# Patient Record
Sex: Male | Born: 1940 | Race: White | Hispanic: No | Marital: Married | Smoking: Former smoker
Health system: Southern US, Community
[De-identification: ages and names within clinical notes are randomized; demographics above are authoritative.]

## PROBLEM LIST (undated history)

## (undated) DIAGNOSIS — C4491 Basal cell carcinoma of skin, unspecified: Secondary | ICD-10-CM

## (undated) DIAGNOSIS — G4733 Obstructive sleep apnea (adult) (pediatric): Secondary | ICD-10-CM

## (undated) DIAGNOSIS — N189 Chronic kidney disease, unspecified: Secondary | ICD-10-CM

## (undated) DIAGNOSIS — H409 Unspecified glaucoma: Secondary | ICD-10-CM

## (undated) DIAGNOSIS — E119 Type 2 diabetes mellitus without complications: Secondary | ICD-10-CM

## (undated) DIAGNOSIS — I2510 Atherosclerotic heart disease of native coronary artery without angina pectoris: Secondary | ICD-10-CM

## (undated) DIAGNOSIS — D518 Other vitamin B12 deficiency anemias: Secondary | ICD-10-CM

## (undated) DIAGNOSIS — C4492 Squamous cell carcinoma of skin, unspecified: Secondary | ICD-10-CM

## (undated) DIAGNOSIS — E785 Hyperlipidemia, unspecified: Secondary | ICD-10-CM

## (undated) DIAGNOSIS — I219 Acute myocardial infarction, unspecified: Secondary | ICD-10-CM

## (undated) DIAGNOSIS — C439 Malignant melanoma of skin, unspecified: Secondary | ICD-10-CM

## (undated) DIAGNOSIS — C4330 Malignant melanoma of unspecified part of face: Secondary | ICD-10-CM

## (undated) DIAGNOSIS — H269 Unspecified cataract: Secondary | ICD-10-CM

## (undated) DIAGNOSIS — I10 Essential (primary) hypertension: Secondary | ICD-10-CM

## (undated) DIAGNOSIS — G4730 Sleep apnea, unspecified: Secondary | ICD-10-CM

## (undated) DIAGNOSIS — K635 Polyp of colon: Secondary | ICD-10-CM

## (undated) HISTORY — DX: Other vitamin B12 deficiency anemias: D51.8

## (undated) HISTORY — DX: Malignant melanoma of unspecified part of face: C43.30

## (undated) HISTORY — DX: Polyp of colon: K63.5

## (undated) HISTORY — PX: CORONARY ANGIOPLASTY WITH STENT PLACEMENT: SHX49

## (undated) HISTORY — PX: CERVICAL SPINE SURGERY: SHX589

## (undated) HISTORY — PX: APPENDECTOMY: SHX54

## (undated) HISTORY — DX: Chronic kidney disease, unspecified: N18.9

## (undated) HISTORY — PX: HERNIA REPAIR: SHX51

## (undated) HISTORY — DX: Unspecified cataract: H26.9

## (undated) HISTORY — DX: Hyperlipidemia, unspecified: E78.5

## (undated) HISTORY — PX: EYE SURGERY: SHX253

## (undated) HISTORY — DX: Acute myocardial infarction, unspecified: I21.9

## (undated) HISTORY — DX: Essential (primary) hypertension: I10

## (undated) HISTORY — DX: Obstructive sleep apnea (adult) (pediatric): G47.33

## (undated) HISTORY — DX: Sleep apnea, unspecified: G47.30

## (undated) HISTORY — DX: Malignant melanoma of skin, unspecified: C43.9

## (undated) HISTORY — DX: Unspecified glaucoma: H40.9

## (undated) HISTORY — DX: Type 2 diabetes mellitus without complications: E11.9

## (undated) HISTORY — DX: Atherosclerotic heart disease of native coronary artery without angina pectoris: I25.10

## (undated) HISTORY — PX: CHOLECYSTECTOMY: SHX55

---
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
## 1997-08-05 DIAGNOSIS — C4491 Basal cell carcinoma of skin, unspecified: Secondary | ICD-10-CM

## 1997-08-05 HISTORY — DX: Basal cell carcinoma of skin, unspecified: C44.91

## 2002-12-17 HISTORY — PX: COLONOSCOPY: SHX174

## 2003-04-28 ENCOUNTER — Ambulatory Visit (HOSPITAL_COMMUNITY): Admission: RE | Admit: 2003-04-28 | Discharge: 2003-04-28 | Payer: Self-pay | Admitting: Gastroenterology

## 2003-04-28 ENCOUNTER — Encounter (INDEPENDENT_AMBULATORY_CARE_PROVIDER_SITE_OTHER): Payer: Self-pay | Admitting: Specialist

## 2004-05-12 ENCOUNTER — Ambulatory Visit (HOSPITAL_COMMUNITY): Admission: RE | Admit: 2004-05-12 | Discharge: 2004-05-13 | Payer: Self-pay | Admitting: Cardiology

## 2005-01-25 ENCOUNTER — Ambulatory Visit: Payer: Self-pay | Admitting: Cardiology

## 2005-02-23 ENCOUNTER — Ambulatory Visit: Payer: Self-pay | Admitting: Cardiology

## 2005-03-08 DIAGNOSIS — C4492 Squamous cell carcinoma of skin, unspecified: Secondary | ICD-10-CM

## 2005-03-08 HISTORY — DX: Squamous cell carcinoma of skin, unspecified: C44.92

## 2006-04-24 ENCOUNTER — Ambulatory Visit: Payer: Self-pay

## 2007-06-17 LAB — HM COLONOSCOPY

## 2007-06-18 ENCOUNTER — Ambulatory Visit: Payer: Self-pay | Admitting: Cardiology

## 2008-07-14 ENCOUNTER — Ambulatory Visit: Payer: Self-pay | Admitting: Cardiology

## 2008-10-28 ENCOUNTER — Ambulatory Visit (HOSPITAL_COMMUNITY): Admission: RE | Admit: 2008-10-28 | Discharge: 2008-10-28 | Payer: Self-pay | Admitting: Surgery

## 2009-05-13 DIAGNOSIS — E785 Hyperlipidemia, unspecified: Secondary | ICD-10-CM | POA: Insufficient documentation

## 2009-05-13 DIAGNOSIS — I2510 Atherosclerotic heart disease of native coronary artery without angina pectoris: Secondary | ICD-10-CM | POA: Insufficient documentation

## 2009-05-18 ENCOUNTER — Ambulatory Visit: Payer: Self-pay | Admitting: Cardiology

## 2009-09-12 ENCOUNTER — Encounter: Payer: Self-pay | Admitting: Cardiology

## 2010-02-02 ENCOUNTER — Encounter: Admission: RE | Admit: 2010-02-02 | Discharge: 2010-02-02 | Payer: Self-pay | Admitting: Family Medicine

## 2010-02-13 ENCOUNTER — Encounter: Payer: Self-pay | Admitting: Cardiology

## 2010-02-15 ENCOUNTER — Telehealth: Payer: Self-pay | Admitting: Cardiology

## 2010-02-23 ENCOUNTER — Inpatient Hospital Stay (HOSPITAL_COMMUNITY): Admission: RE | Admit: 2010-02-23 | Discharge: 2010-02-25 | Payer: Self-pay | Admitting: Neurosurgery

## 2010-03-10 ENCOUNTER — Encounter: Payer: Self-pay | Admitting: Cardiology

## 2010-03-21 ENCOUNTER — Encounter: Payer: Self-pay | Admitting: Cardiology

## 2010-05-24 ENCOUNTER — Ambulatory Visit: Payer: Self-pay | Admitting: Cardiology

## 2010-05-24 DIAGNOSIS — E1121 Type 2 diabetes mellitus with diabetic nephropathy: Secondary | ICD-10-CM | POA: Insufficient documentation

## 2010-06-21 ENCOUNTER — Telehealth (INDEPENDENT_AMBULATORY_CARE_PROVIDER_SITE_OTHER): Payer: Self-pay | Admitting: *Deleted

## 2010-06-22 ENCOUNTER — Ambulatory Visit: Payer: Self-pay | Admitting: Internal Medicine

## 2010-06-22 ENCOUNTER — Encounter: Payer: Self-pay | Admitting: Internal Medicine

## 2010-06-22 ENCOUNTER — Encounter (INDEPENDENT_AMBULATORY_CARE_PROVIDER_SITE_OTHER): Payer: Self-pay | Admitting: *Deleted

## 2010-06-22 ENCOUNTER — Ambulatory Visit: Payer: Self-pay

## 2010-06-22 ENCOUNTER — Encounter (HOSPITAL_COMMUNITY): Admission: RE | Admit: 2010-06-22 | Discharge: 2010-08-23 | Payer: Self-pay | Admitting: Cardiology

## 2010-06-23 ENCOUNTER — Inpatient Hospital Stay (HOSPITAL_BASED_OUTPATIENT_CLINIC_OR_DEPARTMENT_OTHER): Admission: RE | Admit: 2010-06-23 | Discharge: 2010-06-23 | Payer: Self-pay | Admitting: Cardiovascular Disease

## 2010-06-23 ENCOUNTER — Ambulatory Visit: Payer: Self-pay | Admitting: Cardiovascular Disease

## 2010-06-27 ENCOUNTER — Encounter: Payer: Self-pay | Admitting: Cardiology

## 2010-06-28 ENCOUNTER — Ambulatory Visit: Payer: Self-pay | Admitting: Cardiology

## 2010-09-26 LAB — FECAL OCCULT BLOOD, GUAIAC: Fecal Occult Blood: NEGATIVE

## 2010-10-17 LAB — HM DIABETES EYE EXAM

## 2010-12-27 LAB — HEMOGLOBIN A1C: Hgb A1c MFr Bld: 5.7 % (ref 4.0–6.0)

## 2011-01-03 LAB — HM DIABETES FOOT EXAM

## 2011-01-14 LAB — CONVERTED CEMR LAB
BUN: 17 mg/dL (ref 6–23)
Basophils Absolute: 0 10*3/uL (ref 0.0–0.1)
Basophils Relative: 0.3 % (ref 0.0–3.0)
CO2: 24 meq/L (ref 19–32)
Calcium: 9.4 mg/dL (ref 8.4–10.5)
Chloride: 103 meq/L (ref 96–112)
Creatinine, Ser: 1.4 mg/dL (ref 0.4–1.5)
Eosinophils Absolute: 0.1 10*3/uL (ref 0.0–0.7)
Eosinophils Relative: 0.9 % (ref 0.0–5.0)
GFR calc non Af Amer: 55.22 mL/min (ref >60)
Glucose, Bld: 208 mg/dL — ABNORMAL HIGH (ref 70–99)
HCT: 37.9 % — ABNORMAL LOW (ref 39.0–52.0)
Hemoglobin: 13.2 g/dL (ref 13.0–17.0)
INR: 1.1 — ABNORMAL HIGH (ref 0.8–1.0)
Lymphocytes Relative: 31.4 % (ref 12.0–46.0)
Lymphs Abs: 1.8 10*3/uL (ref 0.7–4.0)
MCHC: 34.8 g/dL (ref 30.0–36.0)
MCV: 94.2 fL (ref 78.0–100.0)
Monocytes Absolute: 0.4 10*3/uL (ref 0.1–1.0)
Monocytes Relative: 6.5 % (ref 3.0–12.0)
Neutro Abs: 3.5 10*3/uL (ref 1.4–7.7)
Neutrophils Relative %: 60.9 % (ref 43.0–77.0)
Platelets: 180 10*3/uL (ref 150.0–400.0)
Potassium: 3.8 meq/L (ref 3.5–5.1)
Prothrombin Time: 11.8 s (ref 9.7–11.8)
RBC: 4.02 M/uL — ABNORMAL LOW (ref 4.22–5.81)
RDW: 13.8 % (ref 11.5–14.6)
Sodium: 139 meq/L (ref 135–145)
WBC: 5.7 10*3/uL (ref 4.5–10.5)
aPTT: 29 s — ABNORMAL HIGH (ref 21.7–28.8)

## 2011-01-18 NOTE — Miscellaneous (Signed)
  Clinical Lists Changes  Observations: Added new observation of CARDCATHFIND: . Total occlusion of the right coronary artery with right-to-right     and left-to-right collaterals. 2. Nonobstructive left main, left anterior descending, and left     circumflex stenoses. 3. Mild left ventricular contraction abnormality with overall     preserved left ventricular ejection fraction.   DISCUSSION:  Timothy [name] is asymptomatic at his current activity level. He does not push his exercise program to the same level that he exercised on the treadmill yesterday.  I think the most prudent course of therapy is to continue with medical treatment.  His occluded right coronary artery is technically approachable by percutaneous intervention, but his stress Myoview results looked like only a small area of ischemia, even though he did have some EKG changes and chest burning at peak exercise.  If he has symptoms refractory to medical therapy, we would then consider percutaneous treatment of his occluded right coronary artery.  I will discuss this with Dr. Percival Spanish.     (06/23/2010 10:26) Added new observation of NUCLEAR NOS: Exercise Capacity: Good exercise capacity. BP Response: Normal blood pressure response. Clinical Symptoms: Chest burning. ECG Impression: 1 mm flat ST depression in Stage II in leads V4-V6; increased to leads II, AVF.  Became more downslping in recovery.  Did not completeley resolve. Overall Impression Comments: Inferior/inferoseptal/apical scar with mild ischemia.  Cannot exclude coexistent soft tissue attenuation. Since previous report EKG changes are new. Inferior, inferoseptal, inferolateral scar with ischemia noted at that time as well.  Cannot completely compare as images not available (06/22/2010 10:26)       Nuclear Study  Procedure date:  06/22/2010  Findings:      Exercise Capacity: Good exercise capacity. BP Response: Normal blood pressure response. Clinical  Symptoms: Chest burning. ECG Impression: 1 mm flat ST depression in Stage II in leads V4-V6; increased to leads II, AVF.  Became more downslping in recovery.  Did not completeley resolve. Overall Impression Comments: Inferior/inferoseptal/apical scar with mild ischemia.  Cannot exclude coexistent soft tissue attenuation. Since previous report EKG changes are new. Inferior, inferoseptal, inferolateral scar with ischemia noted at that time as well.  Cannot completely compare as images not available  Cardiac Cath  Procedure date:  06/23/2010  Findings:      . Total occlusion of the right coronary artery with right-to-right     and left-to-right collaterals. 2. Nonobstructive left main, left anterior descending, and left     circumflex stenoses. 3. Mild left ventricular contraction abnormality with overall     preserved left ventricular ejection fraction.   DISCUSSION:  Timothy [name] is asymptomatic at his current activity level. He does not push his exercise program to the same level that he exercised on the treadmill yesterday.  I think the most prudent course of therapy is to continue with medical treatment.  His occluded right coronary artery is technically approachable by percutaneous intervention, but his stress Myoview results looked like only a small area of ischemia, even though he did have some EKG changes and chest burning at peak exercise.  If he has symptoms refractory to medical therapy, we would then consider percutaneous treatment of his occluded right coronary artery.  I will discuss this with Dr. Percival Spanish.

## 2011-01-18 NOTE — Consult Note (Signed)
Summary: [facility]  [facility]   Imported By: Marilynne Drivers 03/07/2010 16:51:52  _____________________________________________________________________  External Attachment:    Type:   Image     Comment:   External Document

## 2011-01-18 NOTE — Letter (Signed)
Summary: [facility] Office Note  [facility] Office Note   Imported By: Sallee Provencal 04/24/2010 11:24:45  _____________________________________________________________________  External Attachment:    Type:   Image     Comment:   External Document

## 2011-01-18 NOTE — Progress Notes (Signed)
Summary: Nuclear Pre-Procedure  Phone Note Outgoing Call   Call placed by: Perrin Maltese, EMT-P,  June 21, 2010 4:33 PM Summary of Call: Reviewed information on Myoview Information Sheet (see scanned document for further details).  Spoke with patient's wife.     Nuclear Med Background Indications for Stress Test: Evaluation for Ischemia, Graft Patency, Stent Patency   History: Angioplasty, Heart Catheterization, Myocardial Perfusion Study, Stents  History Comments: '99 Angioplasty RCA '05 MPS mild inf. inferoapical ischemia EF49% '05 Heart Cath EF 55% High Grade RCA N/O CAD LCFX- LAD '05 Stents Prox. RCA distal RCA     Nuclear Pre-Procedure Cardiac Risk Factors: Family History - CAD, History of Smoking, Lipids, NIDDM Height (in): 54  Nuclear Med Study 1 or 2 day study:  1 day     Referring MD:  J.Hochrein

## 2011-01-18 NOTE — Letter (Signed)
Summary: Cardiac Catheterization Instructions- [place], [place]  [id number] [place]   [place], [place] [address]   Phone: [phone]  Fax: [phone]     06/22/2010 MRN: [id number]  [address] [place],   [address]  Dear Mr. Minich,   You are scheduled for a Cardiac Catheterization on Friday 06/23/2010 with Dr.Cooper  Please arrive to the 1st floor of the Heart and Vascular Center at [facility] at 9:30 am  on the day of your procedure. Please do not arrive before 6:30 a.m. Call the Heart and Vascular Center at [phone] if you are unable to make your appointmnet. The Code to get into the parking garage under the building is 100. Take the elevators to the 1st floor. You must have someone to drive you home. Someone must be with you for the first 24 hours after you arrive home. Please wear clothes that are easy to get on and off and wear slip-on shoes. Do not eat or drink after midnight except water with your medications that morning. Bring all your medications and current insurance cards with you.  ___ DO NOT take these medications before your procedure: Metformin ___ Make sure you take your aspirin.  ___ You may take ALL of your medications with water that morning.  ___ DO NOT take ANY medications before your procedure.    The usual length of stay after your procedure is 2 to 3 hours. This can vary.  If you have any questions, please call the office at the number listed above.   Sim Boast, RN

## 2011-01-18 NOTE — Letter (Signed)
Summary: [facility]  [facility]   Imported By: Sallee Provencal 03/31/2010 14:26:41  _____________________________________________________________________  External Attachment:    Type:   Image     Comment:   External Document

## 2011-01-18 NOTE — Assessment & Plan Note (Signed)
Summary: [facility]      Allergies Added:   Visit Type:  Follow-up Primary Provider:  Dr. Laurance Flatten  CC:  CAD.  History of Present Illness: The patient presents for followup after cardiac catheterization. He had a stress test demonstrating inferior wall infarct with peri-infarct ischemia. He had previous stenting to 2 areas of his right coronary artery. The distal stent was found to be occluded. He had a 30-40% LAD stenosis as well as a 50% obtuse marginal stenosis. He has been managed medically. Since that time he denies any chest pressure, neck or arm discomfort. He has had no palpitations, presyncope or syncope. He denies any PND or orthopnea. He had no problems with his leg where he had the cardiac catheterization.  Current Medications (verified): 1)  Lipitor 80 Mg Tabs (Atorvastatin Calcium) .Marland Kitchen.. 1 By Mouth Daily 2)  Aspirin 81 Mg  Tabs (Aspirin) .Marland Kitchen.. 1 By Mouth Daily 3)  Plavix 75 Mg Tabs (Clopidogrel Bisulfate) .Marland Kitchen.. 1 By Mouth Dialy 4)  Azor 5-40 Mg Tabs (Amlodipine-Olmesartan) .Marland Kitchen.. 1 By Mouth Daily 5)  Protonix 40 Mg Tbec (Pantoprazole Sodium) .Marland Kitchen.. 1 By Mouth Daily 6)  Trilipix 135 Mg Cpdr (Choline Fenofibrate) .Marland Kitchen.. 1 By Mouth Daily 7)  Metoprolol Succinate 50 Mg Xr24h-Tab (Metoprolol Succinate) .Marland Kitchen.. 1 By Mouth Dialy 8)  Metformin Hcl 1000 Mg Tabs (Metformin Hcl) .... By Mouth Two Times A Day 9)  Vitamin D 1000 Unit Tabs (Cholecalciferol) .Marland Kitchen.. 1 By Mouth Daily 10)  Niaspan 1000 Mg Cr-Tabs (Niacin (Antihyperlipidemic)) .Marland Kitchen.. 1 By Mouth Daily 11)  Hydrochlorothiazide 25 Mg Tabs (Hydrochlorothiazide) .Marland Kitchen.. 1 By Mouth Daily 12)  Fish Oil   Oil (Fish Oil) .... 2 By Mouth Two Times A Day 13)  Nitrostat 0.4 Mg Subl (Nitroglycerin) .Marland Kitchen.. 1 By Mouth As Needed  Allergies (verified): 1)  ! Vicodin  Past History:  Past Medical History:  1. Coronary artery disease (left main normal, LAD 25-30% stenosis,       distal 30-40% stenosis, circumflex obtuse marginal 50% stenosis,   right coronary artery dominant with long 75% followed by mid 80%       stenosis followed by 9% stenosis at the ostium of the PDA.  He had       stenting of the PDA by Dr. [name].  This was a Cypher stent.       First angioplasty was 43 and his most recent in 2005.  Cath ).   2. Dyslipidemia.   3. Diabetes mellitus  4. BPH  Past Surgical History: Reviewed history from 05/24/2010 and no changes required.  Cholecystectomy.   Appendectomy.   Cervical Spine Surgery  Review of Systems       As stated in the HPI and negative for all other systems.   Vital Signs:  Patient profile:   70 year old male Height:      68 inches Weight:      191 pounds BMI:     29.15 Pulse rate:   76 / minute Resp:     16 per minute BP sitting:   122 / 70  (right arm)  Vitals Entered By: Levora Angel, CNA (June 28, 2010 2:49 PM)  Physical Exam  General:  Well developed, well nourished, in no acute distress. Head:  normocephalic and atraumatic Eyes:  PERRLA/EOM intact; conjunctiva and lids normal. Mouth:  Edentulous. Oral mucosa normal. Neck:  Neck supple, no JVD. No masses, thyromegaly or abnormal cervical nodes. Chest Wall:  no deformities or breast masses noted Lungs:  clear Abdomen:  Bowel sounds positive; abdomen soft and non-tender without masses, organomegaly, or hernias noted. No hepatosplenomegaly. Msk:  Back normal, normal gait. Muscle strength and tone normal. Extremities:  No clubbing or cyanosis. Neurologic:  Alert and oriented x 3. Skin:  Intact without lesions or rashes. Cervical Nodes:  no significant adenopathy Inguinal Nodes:  no significant adenopathy Psych:  Normal affect.   EKG  Procedure date:  06/28/2010  Findings:      Sinus rhythm, rate 77, axis within normal limits, intervals within normal limits, no acute ST-T wave changes.  Impression & Recommendations:  Problem # 1:  CAD (ICD-414.00) Patient has no new symptoms. He has disease as described and will be  managed with aggressive risk reduction. We reviewed at length and exercise regimen. Orders: EKG w/ Interpretation (93000)  Problem # 2:  DYSLIPIDEMIA (ICD-272.4) He is still not at target with his lipids despite multiple medications. It is clear that his diet is not well controlled. We had a long discussion about this. Orders: EKG w/ Interpretation (93000)  Problem # 3:  ESSENTIAL HYPERTENSION, BENIGN (ICD-401.1) His blood pressure is well controlled. He will continue the meds as listed.  Patient Instructions: 1)  Your physician recommends that you schedule a follow-up appointment in: 1 yr with Dr Percival Spanish 2)  Your physician recommends that you continue on your current medications as directed. Please refer to the Current Medication list given to you today.

## 2011-01-18 NOTE — Assessment & Plan Note (Signed)
Summary: Cardiology Nuclear Study  Nuclear Med Background Indications for Stress Test: Evaluation for Ischemia, Graft Patency, Stent Patency   History: Angioplasty, Heart Catheterization, Myocardial Perfusion Study, Stents  History Comments: '99 Angioplasty RCA '05 MPS mild inf. inferoapical ischemia EF49% '05 Heart Cath EF 55% High Grade RCA N/O CAD LCFX- LAD '05 Stents Prox. RCA distal RCA  Symptoms: Chest Pain, DOE, Palpitations, SOB    Nuclear Pre-Procedure Cardiac Risk Factors: Family History - CAD, History of Smoking, Lipids, NIDDM Caffeine/Decaff Intake: none NPO After: 8:30 PM Lungs: clear IV 0.9% NS with Angio Cath: 18g     IV Site: (R) AC IV Started by: Eliezer Lofts EMT-P Chest Size (in) 44     Height (in): 68 Weight (lb): 187 BMI: 28.54 Tech Comments: CBG= 140 @ 7:20 am this day, per Patient. This patient walked on the treadmill. He had chest burning, + EKG changes, and + pictures. Dr. Vita Barley was consulted and he stated he was to be set up for a cath on 06/23/10.  Nuclear Med Study 1 or 2 day study:  1 day     Stress Test Type:  Stress Reading MD:  Dorris Carnes, MD     Referring MD:  J.Hochrein Resting Radionuclide:  Technetium 18m Tetrofosmin     Resting Radionuclide Dose:  11 mCi  Stress Radionuclide:  Technetium 34m Tetrofosmin     Stress Radionuclide Dose:  33 mCi   Stress Protocol Exercise Time (min):  8:00 min     Max HR:  133 bpm     Predicted Max HR:  123XX123 bpm  Max Systolic BP: Q000111Q mm Hg     Percent Max HR:  88.08 %     METS: 10.10 Rate Pressure Product:  [id number]    Stress Test Technologist:  Perrin Maltese EMT-P     Nuclear Technologist:  Charlton Amor CNMT  Rest Procedure  Myocardial perfusion imaging was performed at rest 45 minutes following the intravenous administration of Myoview Technetium 65m Tetrofosmin.  Stress Procedure  The patient exercised for 8:00. The patient stopped due to fatigue and chest burning.  There were + significant ST-T  wave changes and a rare pac. Myoview was injected at peak exercise and myocardial perfusion imaging was performed after a brief delay.  QPS Raw Data Images:  Soft tissue (diaphragm) underlies heart. Stress Images:  Defect with decreased counts in the inferior(base, mid, distal), inferoseptal (base, minimally mid),Inferolateral (base) apex. Rest Images:  Improvement in the distal inferior wall, basal inferoseptal wall and partial improvement in apex. Transient Ischemic Dilatation:  .99  (Normal <1.22)  Lung/Heart Ratio:  .40  (Normal <0.45)  Quantitative Gated Spect Images QGS EDV:  86 ml QGS ESV:  39 ml QGS EF:  55 % QGS cine images:  Inferior hypokinesis   Overall Impression  Exercise Capacity: Good exercise capacity. BP Response: Normal blood pressure response. Clinical Symptoms: Chest burning. ECG Impression: 1 mm flat ST depression in Stage II in leads V4-V6; increased to leads II, AVF.  Became more downslping in recovery.  Did not completeley resolve. Overall Impression Comments: Inferior/inferoseptal/apical scar with mild ischemia.  Cannot exclude coexistent soft tissue attenuation. Since previous report EKG changes are new. Inferior, inferoseptal, inferolateral scar with ischemia noted at that time as well.  Cannot completely compare as images not available  Appended Document: Cardiology Nuclear Study pt aware of results - cath 06/23/2010 in [place] lab

## 2011-01-18 NOTE — Progress Notes (Signed)
Summary: clearance for surgery   Phone Note From Other Clinic   Summary of Call: I had a request from Dr. Frederich Cha to provide surgical clearance for this patient.  I spoke with him on the phone.  He has been active and has had no chest pain or other symptoms.  He has a high functional level.  He is having cervical spine surgery.  According to ACC/AHA guidelines the patient is at acceptable risk for this procedure. He can stop his Plavix prior to the surgery and restarted when felt to be safe afterwards. Initial call taken by: Minus Breeding, MD, [facility],  February 15, 2010 4:54 PM

## 2011-01-18 NOTE — Letter (Signed)
Summary: [facility] Office Note  [facility] Office Note   Imported By: Sallee Provencal 03/31/2010 13:20:08  _____________________________________________________________________  External Attachment:    Type:   Image     Comment:   External Document

## 2011-01-18 NOTE — Assessment & Plan Note (Signed)
Summary: [facility]  Medications Added LIPITOR 80 MG TABS (ATORVASTATIN CALCIUM) 1 by mouth daily ASPIRIN 81 MG  TABS (ASPIRIN) 1 by mouth daily PLAVIX 75 MG TABS (CLOPIDOGREL BISULFATE) 1 by mouth dialy AZOR 5-40 MG TABS (AMLODIPINE-OLMESARTAN) 1 by mouth daily PROTONIX 40 MG TBEC (PANTOPRAZOLE SODIUM) 1 by mouth daily TRILIPIX 135 MG CPDR (CHOLINE FENOFIBRATE) 1 by mouth daily METOPROLOL SUCCINATE 50 MG XR24H-TAB (METOPROLOL SUCCINATE) 1 by mouth dialy METFORMIN HCL 1000 MG TABS (METFORMIN HCL) by mouth two times a day VITAMIN D 1000 UNIT TABS (CHOLECALCIFEROL) 1 by mouth daily NIASPAN 1000 MG CR-TABS (NIACIN (ANTIHYPERLIPIDEMIC)) 1 by mouth daily HYDROCHLOROTHIAZIDE 25 MG TABS (HYDROCHLOROTHIAZIDE) 1 by mouth daily FISH OIL   OIL (FISH OIL) 2 by mouth two times a day NITROSTAT 0.4 MG SUBL (NITROGLYCERIN) 1 by mouth as needed      Allergies Added: ! VICODIN  Visit Type:  Follow-up Primary Provider:  Dr. Laurance Flatten  CC:  CAD.  History of Present Illness: The patient presents for yearly followup. Since I last saw him he did have cervical spine surgery. He apparently did well with this. He still has some discomfort. He is able to do some activities around his house but doesn't do anything overly exerting by his report. He may push a lawnmower for a short time. With this he is not getting any shortness of breath.  He denies  PND or orthopnea. He is not describing palpitations, presyncope or syncope. He is having no chest pressure, neck or arm discomfort.    The patient reminds me that prior to his last angioplasty in 2005 he had no symptoms but had a routine stress test.  Current Medications (verified): 1)  Lipitor 80 Mg Tabs (Atorvastatin Calcium) .Marland Kitchen.. 1 By Mouth Daily 2)  Aspirin 81 Mg  Tabs (Aspirin) .Marland Kitchen.. 1 By Mouth Daily 3)  Plavix 75 Mg Tabs (Clopidogrel Bisulfate) .Marland Kitchen.. 1 By Mouth Dialy 4)  Azor 5-40 Mg Tabs (Amlodipine-Olmesartan) .Marland Kitchen.. 1 By Mouth Daily 5)  Protonix 40 Mg  Tbec (Pantoprazole Sodium) .Marland Kitchen.. 1 By Mouth Daily 6)  Trilipix 135 Mg Cpdr (Choline Fenofibrate) .Marland Kitchen.. 1 By Mouth Daily 7)  Metoprolol Succinate 50 Mg Xr24h-Tab (Metoprolol Succinate) .Marland Kitchen.. 1 By Mouth Dialy 8)  Metformin Hcl 1000 Mg Tabs (Metformin Hcl) .... By Mouth Two Times A Day 9)  Vitamin D 1000 Unit Tabs (Cholecalciferol) .Marland Kitchen.. 1 By Mouth Daily 10)  Niaspan 1000 Mg Cr-Tabs (Niacin (Antihyperlipidemic)) .Marland Kitchen.. 1 By Mouth Daily 11)  Hydrochlorothiazide 25 Mg Tabs (Hydrochlorothiazide) .Marland Kitchen.. 1 By Mouth Daily 12)  Fish Oil   Oil (Fish Oil) .... 2 By Mouth Two Times A Day 13)  Nitrostat 0.4 Mg Subl (Nitroglycerin) .Marland Kitchen.. 1 By Mouth As Needed  Allergies (verified): 1)  ! Vicodin  Past History:  Past Medical History:  1. Coronary artery disease (left main normal, LAD 25-30% stenosis,       distal 30-40% stenosis, circumflex obtuse marginal 50% stenosis,       right coronary artery dominant with long 75% followed by mid 80%       stenosis followed by 9% stenosis at the ostium of the PDA.  He had       stenting of the PDA by Dr. [name].  This was a Cypher stent.       First angioplasty was 107 and his most recent in 2005).   2. Dyslipidemia.   3. Diabetes mellitus  4. BPH  Past Surgical History:  Cholecystectomy.   Appendectomy.  Cervical Spine Surgery  Review of Systems       As stated in the HPI and negative for all other systems.   Vital Signs:  Patient profile:   70 year old male Height:      68 inches Weight:      188 pounds BMI:     28.69 Pulse rate:   69 / minute Resp:     16 per minute BP sitting:   128 / 62  (right arm)  Vitals Entered By: Levora Angel, CNA (May 24, 2010 9:44 AM)  Physical Exam  General:  Well developed, well nourished, in no acute distress. Head:  normocephalic and atraumatic Eyes:  PERRLA/EOM intact; conjunctiva and lids normal. Mouth:  Edentulous. Oral mucosa normal. Neck:  Neck supple, no JVD. No masses, thyromegaly or abnormal cervical  nodes. Chest Wall:  no deformities or breast masses noted Lungs:  Clear bilaterally to auscultation and percussion. Abdomen:  Bowel sounds positive; abdomen soft and non-tender without masses, organomegaly, or hernias noted. No hepatosplenomegaly. Msk:  Back normal, normal gait. Muscle strength and tone normal. Extremities:  No clubbing or cyanosis. Neurologic:  Alert and oriented x 3. Skin:  Intact without lesions or rashes. Cervical Nodes:  no significant adenopathy Axillary Nodes:  no significant adenopathy Inguinal Nodes:  no significant adenopathy Psych:  Normal affect.   Detailed Cardiovascular Exam  Neck    Carotids: Carotids full and equal bilaterally without bruits.      Neck Veins: Normal, no JVD.    Heart    Inspection: no deformities or lifts noted.      Palpation: normal PMI with no thrills palpable.      Auscultation: regular rate and rhythm, S1, S2 without murmurs, rubs, gallops, or clicks.    Vascular    Abdominal Aorta: no palpable masses, pulsations, or audible bruits.      Femoral Pulses: normal femoral pulses bilaterally.      Pedal Pulses: normal pedal pulses bilaterally.      Radial Pulses: normal radial pulses bilaterally.      Peripheral Circulation: no clubbing, cyanosis, or edema noted with normal capillary refill.     EKG  Procedure date:  05/24/2010  Findings:      Sus rhythm, rate 75, axis within normal limits, intervals within normal limits, early transition lead V2  Impression & Recommendations:  Problem # 1:  CAD (ICD-414.00) The patient has coronary disease as described above. He has not had adequate symptoms to indicate progression of disease in the past. It has been 6 years since his last stress test. Therefore, one is indicated. We will otherwise continue with risk reduction. Orders: Nuclear Stress Test (Nuc Stress Test) EKG w/ Interpretation (93000)  Problem # 2:  DYSLIPIDEMIA (ICD-272.4) I reviewed his lipids from May. His LDL was  within target but his HDL is too low. I have instructed him on increased exercise. Dr. Laurance Flatten increased his fish oil.  Problem # 3:  DM (ICD-250.00) His hemoglobin A1c was 6.2. He continues with medical management and diet control.  Patient Instructions: 1)  Your physician recommends that you schedule a follow-up appointment in: 1 yr  In [place] 2)  Your physician recommends that you continue on your current medications as directed. Please refer to the Current Medication list given to you today. 3)  Your physician has requested that you have an exercise stress myoview.  For further information please visit [email or web address].  Please follow instruction sheet, as given.

## 2011-03-09 ENCOUNTER — Encounter: Payer: Self-pay | Admitting: Family Medicine

## 2011-03-09 DIAGNOSIS — N400 Enlarged prostate without lower urinary tract symptoms: Secondary | ICD-10-CM | POA: Insufficient documentation

## 2011-03-09 DIAGNOSIS — K635 Polyp of colon: Secondary | ICD-10-CM | POA: Insufficient documentation

## 2011-03-09 DIAGNOSIS — D518 Other vitamin B12 deficiency anemias: Secondary | ICD-10-CM

## 2011-03-09 DIAGNOSIS — I2510 Atherosclerotic heart disease of native coronary artery without angina pectoris: Secondary | ICD-10-CM

## 2011-03-09 DIAGNOSIS — I10 Essential (primary) hypertension: Secondary | ICD-10-CM

## 2011-03-11 LAB — CBC
HCT: 38.9 % — ABNORMAL LOW (ref 39.0–52.0)
Hemoglobin: 13.6 g/dL (ref 13.0–17.0)
MCHC: 35 g/dL (ref 30.0–36.0)
MCV: 94.6 fL (ref 78.0–100.0)
Platelets: 179 10*3/uL (ref 150–400)
RBC: 4.11 MIL/uL — ABNORMAL LOW (ref 4.22–5.81)
RDW: 13.4 % (ref 11.5–15.5)
WBC: 8.2 10*3/uL (ref 4.0–10.5)

## 2011-03-11 LAB — GLUCOSE, CAPILLARY
Glucose-Capillary: 140 mg/dL — ABNORMAL HIGH (ref 70–99)
Glucose-Capillary: 153 mg/dL — ABNORMAL HIGH (ref 70–99)
Glucose-Capillary: 162 mg/dL — ABNORMAL HIGH (ref 70–99)
Glucose-Capillary: 165 mg/dL — ABNORMAL HIGH (ref 70–99)
Glucose-Capillary: 170 mg/dL — ABNORMAL HIGH (ref 70–99)
Glucose-Capillary: 171 mg/dL — ABNORMAL HIGH (ref 70–99)
Glucose-Capillary: 172 mg/dL — ABNORMAL HIGH (ref 70–99)
Glucose-Capillary: 186 mg/dL — ABNORMAL HIGH (ref 70–99)
Glucose-Capillary: 248 mg/dL — ABNORMAL HIGH (ref 70–99)

## 2011-03-11 LAB — BASIC METABOLIC PANEL
BUN: 13 mg/dL (ref 6–23)
CO2: 26 mEq/L (ref 19–32)
Calcium: 9.6 mg/dL (ref 8.4–10.5)
Chloride: 102 mEq/L (ref 96–112)
Creatinine, Ser: 1.34 mg/dL (ref 0.4–1.5)
GFR calc Af Amer: >60 mL/min (ref >60)
GFR calc non Af Amer: 53 mL/min — ABNORMAL LOW (ref >60)
Glucose, Bld: 140 mg/dL — ABNORMAL HIGH (ref 70–99)
Potassium: 4 mEq/L (ref 3.5–5.1)
Sodium: 132 mEq/L — ABNORMAL LOW (ref 135–145)

## 2011-03-11 LAB — APTT: aPTT: 31 seconds (ref 24–37)

## 2011-03-11 LAB — TYPE AND SCREEN
ABO/RH(D): B POS
Antibody Screen: NEGATIVE

## 2011-03-11 LAB — PROTIME-INR
INR: 1.02 (ref 0.00–1.49)
Prothrombin Time: 13.3 seconds (ref 11.6–15.2)

## 2011-03-11 LAB — ABO/RH: ABO/RH(D): B POS

## 2011-03-11 LAB — SURGICAL PCR SCREEN
MRSA, PCR: NEGATIVE
Staphylococcus aureus: NEGATIVE

## 2011-05-01 NOTE — Assessment & Plan Note (Signed)
[place] OFFICE NOTE   Timothy [name], Timothy [name]                       MRN:          [id number]  DATE:06/18/2007                            DOB:          [birth year]    PRIMARY:  Dr. Morrie Sheldon   REASON FOR PRESENTATION:  Patient with coronary disease.   HISTORY OF PRESENT ILLNESS:  The patient returns for a yearly followup.  He is now 71 years old.  He has done well since I last saw him.  He  walks 4 or more times a week for exercise.  He says he walks briskly.  He says if he gets going very fast he may get some chest discomfort that  goes away if he slows down a little.  This has been a stable pattern.  This is not new.  He has not had any resting complaints.  He denies any  neck or arm discomfort.  He has no shortness of breath.  He denies any  PND or orthopnea.  He has had no palpitation, presyncope, or syncope.   He keeps a blood pressure diary.  His blood pressure is nicely  controlled with very rare blood pressures in the 0000000 or [id number] systolic.  He seems to be elevated when he comes to the doctors office.   PAST MEDICAL HISTORY:  Coronary artery disease (left main normal, LAD  25% to 30% stenosed, distal 30% to 40% stenosed.  Circumflex obtuse  marginal 50% stenosed, right coronary artery dominant with long 75%  followed by mid 80%, followed by 90% stenosis at the ostium of the PDA.  The patient had stenting of the PDA by Dr. [name].  His first  angioplasty was 42 and he had most recent in 2005).  Dyslipidemia,  cholecystectomy, appendectomy.   No allergies.   CURRENT MEDICATIONS:  1. Plavix 75 mg daily.  2. Aspirin 81 mg daily.  3. Toprol 12.5 mg b.i.d.  4. Crestor 20 mg.  5. Fenofibrate  6. Lovaza  7. Prilosec.   REVIEW OF SYSTEMS:  As stated in the HPI.  Otherwise negative for other  systems.   PHYSICAL EXAMINATION:  The patient is in no distress.  Blood pressure 166/80, heart rate 62 and  regular.  Weight 191 pounds.  Body mass index 38.  HEENT:  Eye lids unremarkable.  Pupils equal, round and reactive to  light.  Fundi not visualized.  Oral mucosa unremarkable.  NECK:  No jugular venous distention at 45 degrees.  Carotid upstroke  brisk and symmetric, no bruits, no thyromegaly.  LYMPHATICS:  No cervical, axillary, inguinal adenopathy.  LUNGS:  Clear to auscultation bilaterally.  BACK:  No costovertebral angle tenderness.  CHEST:  Unremarkable.  HEART:  PMI not displaced or sustained.  S1 and S2 within normal limits.  No S3, no S4.  No clicks, no rubs, no murmurs.  ABDOMEN:  Mildly obese, positive bowel sounds.  Normal in frequency,  pitch.  No bruits, no rebound, no guarding, no midline pulse, without  hepatomegaly, splenomegaly.  SKIN:  No rashes.  No nodules.  EXTREMITIES:  2+ pulses throughout, no edema.  No cyanosis, no clubbing.  NEURO:  Oriented to person, place and time.  Cranial nerves II through  XII grossly intact. Motor grossly intact.   EKG:  Sins rhythm, rate 62, axis is within normal limits, probably  inferior posterior infarct with prominent R waves in V2, no specific T  wave flattening, no acute ST-T wave change.   ASSESSMENT AND PLAN:  1. Coronary disease.  The patient is having no symptoms related to      this.  No further cardiovascular testing is suggested.  Of note, he      can come off of his Plavix as needed for an elective colonoscopy.  2. Hypertension.  Blood pressure  is slightly elevated occasionally.      He seems to have some white-coat hypertension.  He is on a very low      dose of beta blocker and I think he would tolerate 25 mg b.i.d.  I      will start this and then switch to a 50 mg once a day extended      release.  3. Obesity, we talked about this and he is intent on continuing to      lose some weight.  4. Dyslipidemia.  He has low HDL.  This is being followed closely and      treated appropriately by Dr. Laurance Flatten.  5. Followup.   I will see him in 1 year or sooner if needed.     Minus Breeding, MD, [facility]  Electronically Signed    JH/MedQ  DD: 06/18/2007  DT: 06/18/2007  Job #: [id number]   cc:   Chipper Herb, M.D.

## 2011-05-01 NOTE — Op Note (Signed)
NAME:  Timothy [name], Timothy [name] NO.:  [id number]   MEDICAL RECORD NO.:  [id number]          PATIENT TYPE:  AMB   LOCATION:  DAY                          FACILITY:  [facility]   PHYSICIAN:  Fenton Malling. Lucia Gaskins, M.D.  DATE OF BIRTH:  September 03, 1941   DATE OF PROCEDURE:  10/28/2008  DATE OF DISCHARGE:                               OPERATIVE REPORT   Date of surgery ??   Date of Surgery ??   PREOPERATIVE DIAGNOSIS:  Bilateral inguinal hernia.   POSTOPERATIVE DIAGNOSIS:  Left direct and indirect inguinal hernia, a  right direct inguinal hernia.   PROCEDURES:  Laparoscopic bilateral inguinal hernia repairs with precut  [employer] C-QUR mesh.   SURGEON:  Fenton Malling. Lucia Gaskins, M.D.   ANESTHESIA:  General endotracheal.   ESTIMATED BLOOD LOSS:  Minimal.   INDICATIONS FOR PROCEDURE:  Mr. Guldner is a 70 year old white male of  Dr. Morrie Sheldon who for 3 or 4 months has noticed increasing inguinal  hernias, the left side being larger than the right side.  He now comes  in for attempted laparoscopic inguinal hernia repair.  The indications  and potential complications of hernia repair were explained to the  patient.  Potential complications include, but not limited to, bleeding,  infection, nerve injury, and recurrence of the hernia.   PROCEDURE IN DETAIL:  The patient was placed in a supine position, given  a general anesthetic supervised Dr. Myrtie Soman, both arms were tucked.  A Foley catheter was placed by me.  He had a little bit of meatal  narrowing that I opened with a hemostat but then the Foley catheter  passed without difficulty.  His lower abdomen was prepped with Techni-  Care and sterilely draped and a time-out held was held identifying the  patient and the procedure.   Because his left inguinal hernia was larger I elected to go through an  infraumbilical incision and dissected at the left side of midline,  opened the anterior rectus fascia, retracted the rectus abdominis muscle  anteriorly and then passed the [place] [employer] balloon in the preperitoneal  space.  This was insufflated under direct visualization dissecting the  peritoneum posteriorly and the muscles anteriorly.  Because of the scar  in his right lower quadrant I did not get good dissection on the right  side as far as exposing the hernia but on the left side I got a very  good dissection.  I then placed two trocars, a 5 mm Ethicon trocar in  the left lower quadrant and a 5 mm in the right lower quadrant.  I  dissected out on the left side.  He has a left sided medium sized direct  inguinal hernia and I think it was the primary hernia I was feeling,  however, I dissected out the cord.  He also had an approximate 7 or 8 cm  indirect inguinal hernia.  I teased this sac off the cord structures and  I did ligate the indirect sac with a 0 PDS suture as an Endoloop.   I then looked at the right lower quadrant.  I dissected back  as much of  this scar from the appendectomy.  I really could not get this back very  far but I was able to see well the inguinal floor.  He also had a direct  inguinal hernia which was smaller on the right than the left.  I did not  find an indirect inguinal hernia but I did isolate the cord structures.  So after both sides were completely resected I then used the C-QUR light  mesh from Atrium on the right side.  I put a precut Atrium mesh lot  number [id number] on the left side.  I put the same precut Atrium mesh  lot number [id number] on the right side.  I wrapped the mesh on both  sides around the cord.  I stapled the mesh medially to the pubic  tubercle and through it to the shelving edge of the inguinal ligament or  Cooper's ligament.  I brought the tack around and encircled the cord  structures.  I put the tacks anteriorly along the transversus abdominis  fascia, but I avoided the area lateral to the course which was inferior  to the ilioinguinal ligament on both sides.  I think  I ended up putting  10 tacks on the right and 11 tacks on the left.  The mesh lay flat.  The  cord structure was encircled and the hernia defects were covered.  The  patient tolerated this well.  There was no bleeding.   I removed the trocars under direct visualization.  I did have one small  hole in the peritoneum and trying to dissect down the right lower  quadrant appendectomy incision.  I then closed the anterior rectus  fascia after removing the trocars with a zero Vicryl suture.  I closed  the skin with a 5-0 Monocryl suture.  I painted each wound with tincture  of Benzoin and Steri-Strips.  The patient tolerated the procedure well,  was transported to the recovery room in good condition.  Sponge and  needle count were correct at the end of the case.      Fenton Malling. Lucia Gaskins, M.D.  Electronically Signed     DHN/MEDQ  D:  10/28/2008  T:  10/28/2008  Job:  [id number]   cc:   Chipper Herb, M.D.  Fax: [phone]   Minus Breeding, MD, [place]  [place] [address]

## 2011-05-01 NOTE — Assessment & Plan Note (Signed)
[place] OFFICE NOTE   Timothy [name], Timothy [name]                       MRN:          [id number]  DATE:05/18/2009                            DOB:          February 18, 1941    PRIMARY CARE PHYSICIAN:  Chipper Herb, M.D.   REASON FOR PRESENTATION:  Evaluate the patient with coronary disease.   HISTORY OF PRESENT ILLNESS:  The patient returns for yearly followup.  Since I last saw him, he has done well.  He has been exercising  routinely.  He has no problems with this level of aerobic activity.  He  denies any chest discomfort, neck or arm discomfort.  He has no  shortness of breath, PND, or orthopnea.  He has no palpitations,  presyncope or syncope.  Of note, he had bilateral inguinal hernia repair  and did well with this since I last saw him.   PAST MEDICAL HISTORY:  Coronary artery disease (left main normal, LAD 25-  30% stenosis, distal 30-40% stenosis, circumflex obtuse marginal 50%  stenosis, right coronary artery dominant with a long 75% followed by mid  80% stenosis followed by 90% stenosis of the ostium of the PDA.  He had  stenting of the PDA by Dr. [name].  This was a Cypher stent.  His first  angioplasty was in 1994.  The most recent was in 2005), dyslipidemia,  cholecystectomy, appendectomy, bilateral inguinal hernia repair,  laparoscopy, chronic renal insufficiency.   ALLERGIES:  None.   MEDICATIONS:  1. Protonix 40 mg daily.  2. Metformin 1000 mg daily.  3. Hydrochlorothiazide 12.5 mg daily.  4. Azor 5/40 daily.  5. Trilipix 135 mg daily.  6. Lipitor 80 daily.  7. Metoprolol 50 mg daily.  8. Aspirin 81 mg daily.  9. Plavix 75 mg daily.   REVIEW OF SYSTEMS:  As stated in the HPI and otherwise negative for  other systems.   PHYSICAL EXAMINATION:  GENERAL:  The patient is pleasant and in no  distress.  VITAL SIGNS:  Blood pressure 124/72, heart rate 78 and regular, weight  190 pounds (down 7  pounds).  HEENT:  Eyelids unremarkable; pupils equal, round, reactive to light;  fundi not visualized, oral mucosa unremarkable.  NECK:  No jugular venous distention at 45 degrees; carotid upstroke  brisk and symmetric; no bruits; no thyromegaly.  LYMPHATICS:  No cervical, axillary, or inguinal adenopathy.  LUNGS:  Clear to auscultation bilaterally.  BACK:  No costovertebral angle tenderness.  CHEST:  Unremarkable.  HEART:  PMI not displaced or sustained; S1 and S2 within normal limits;  no S3; no S4; no clicks, no rubs, no murmurs.  ABDOMEN:  Mildly obese; positive bowel sounds, normal in frequency and  pitch; no bruits, no rebound, no guarding or midline pulsatile mass, no  hepatomegaly, no splenomegaly.  SKIN:  No rashes, no nodules.  EXTREMITIES:  2+ pulses throughout, no edema, no cyanosis, no clubbing.  NEURO:  Oriented to person, place, and time; cranial nerves II through  XII grossly intact; motor grossly intact.   EKG; sinus rhythm, rate 78, axis within  normal limits, intervals within  normal limits, early transition in lead V2, no acute ST wave changes.   ASSESSMENT AND PLAN:  1. Coronary disease.  The patient is having no new symptoms.  No      further cardiovascular testing is suggested.  He can continue with      aggressive risk reduction.  2. Dyslipidemia.  I did review his lipids.  Unfortunately, his HDL      remains low despite his exercise and aggressive combination      therapy.  He may do a little bit better on Niaspan and it might be      worth switching to this, but I will defer to the Lipid Clinic at      [facility] who was following the patient carefully.  3. Followup.  I will see the patient back in 1 year or sooner if      needed.     Minus Breeding, MD, [facility]  Electronically Signed    JH/MedQ  DD: 05/18/2009  DT: 05/19/2009  Job #: [id number]   cc:   Chipper Herb, M.D.

## 2011-05-01 NOTE — Assessment & Plan Note (Signed)
[place] OFFICE NOTE   KOBEE, NONG                       MRN:          [id number]  DATE:07/14/2008                            DOB:          [birth year]    PRIMARY CARE PHYSICIAN:  Chipper Herb, MD   REASON FOR PRESENTATION:  Evaluate the patient with coronary disease.   HISTORY OF PRESENT ILLNESS:  The patient is now a pleasant 70 year old  white gentleman with coronary disease as described below.  This is his  yearly followup.  He has done well from a cardiovascular standpoint in  that timeframe.  He has not had any cardiovascular complaints.  He will  rarely get some chest discomfort, but has had none of the symptoms that  he had at the time of his stent.  He has had no new shortness of breath.  He is not having any PND or orthopnea.  He still working though he does  not exercises routinely as I would like.  Of note, he is due to have  hernia repair upcoming.  He has not yet seen a Psychologist, sport and exercise for this.   PAST MEDICAL HISTORY:  1. Coronary artery disease (left main normal, LAD 25-30% stenosis,      distal 30-40% stenosis, circumflex obtuse marginal 50% stenosis,      right coronary artery dominant with long 75% followed by mid 80%      stenosis followed by 9% stenosis at the ostium of the PDA.  He had      stenting of the PDA by Dr. [name].  This was a Cypher stent.      First angioplasty was 37 and his most recent in 2005).  2. Dyslipidemia.  3. Cholecystectomy.  4. Appendectomy.   ALLERGIES:  None.   MEDICATIONS:  1. Plavix 75 mg daily.  2. Lovaza.  3. Prilosec.  4. Aspirin 81 mg daily.  5. Toprol 50 mg daily.  6. Lipitor 80 mg daily.  7. Trilipix 135 mg daily.   REVIEW OF SYSTEMS:  As stated in the HPI and otherwise negative for  other systems.   PHYSICAL EXAMINATION:  GENERAL:  The patient is in no distress.  VITAL SIGNS:  Blood pressure 122/78, heart rate 67 and regular.  NECK:   No jugular venous distention, 45 degrees.  Carotid upstroke brisk  and symmetrical.  No bruits.  No thyromegaly.  LYMPHATICS:  No cervical, axillary, or inguinal adenopathy.  LUNGS:  Clear to auscultation bilaterally.  HEART:  PMI not displaced or sustained, S1 and S2 within normal limits.  No S3, no S4, no clicks, no rubs, and no murmurs.  ABDOMEN:  Obese, positive bowel sounds, normal in frequency and pitch,  no bruits, no midline pulsatile mass, no organomegaly.  SKIN:  No rashes, no nodules.  EXTREMITIES:  2+ pulses, no edema.   EKG:  Sinus rhythm, rate 67, axis within normal limits, intervals within  normal limits, early transition lead V2, nonspecific lateral T-wave  changes, not changed from previous EKGs.   ASSESSMENT AND PLAN:  1. Coronary artery disease.  The  patient has no ongoing symptoms.  No      further cardiovascular testing is suggested.  He needs secondary      risk reduction.  2. Obesity.  We discussed the need for weight loss and exercise and I      discussed with him an exercise regimen.  3. Dyslipidemia.  He has excellent followup by Dr. Laurance Flatten and he is      trying to treat his low HDL with combination therapy.  Exercise      would hopefully help this.  4. Preoperative clearance.  The patient may be having hernia repair.      He would be able to come off his Plavix for this.  I would like it      to be resumed after the surgery.  He would be at acceptable risk      for the planned surgery based on ACC/AHA guidelines.  5. Followup.  We would like to see the patient back in 1 year or      sooner if needed.     Minus Breeding, MD, [facility]  Electronically Signed    JH/MedQ  DD: 07/14/2008  DT: 07/15/2008  Job #: [id number]   cc:   Chipper Herb, M.D.

## 2011-05-04 NOTE — Discharge Summary (Signed)
NAME:  Timothy [name], Timothy [name]                        ACCOUNT NO.:  [id number]   MEDICAL RECORD NO.:  [id number]                   PATIENT TYPE:  OIB   LOCATION:  6533                                 FACILITY:  [facility]   PHYSICIAN:  Tara C. Jernejcic, P.A.             DATE OF BIRTH:  1941/08/17   DATE OF ADMISSION:  05/12/2004  DATE OF DISCHARGE:                                 DISCHARGE SUMMARY   ADMISSION DIAGNOSES:  1. Unstable angina in the setting of known coronary artery disease.  2. History of RCA PCI 1995.  3. Hyperlipidemia.  4. Remote tobacco use.  5. Stress test May 11, 2004, positive for chest pain and nonspecific ST T     wave changes.   DISCHARGE DIAGNOSES:  Coronary artery disease, status post cardiac  catheterization revealing multiple high grade lesions in the RCA.  a. Successful PCI of the RCA with a proximal and distal placement of Cypher  stents and a distal branch PTCA.  Remainder of the discharge diagnoses the  same.   HISTORY OF PRESENT ILLNESS:  Mr. Gazdik is a 70 year old white male with  remote RCA PCI in 1994.  Normal EF at that time.   Recently, the patient had been experiencing chest pressure and underwent  stress testing at [facility].  Stress test was positive for chest  pain with exertion and nonspecific ST T wave changes.  The patient is now  scheduled for elective cardiac catheterization to definite coronary anatomy.  Risks of medical procedure were reviewed with the patient.  He agreed to  proceed.   PROCEDURES:  Cardiac catheterization with intervention on May 12, 2004.   COMPLICATIONS:  None.   COURSE IN THE HOSPITAL:  Mr. Standage was admitted to [facility] on  May 12, 2004 for a left cardiac cath.   Cardiac catheterization revealed an essentially normal left main, LAD  calcified with a proximal 25-30% lesion and distal 30-40% with small  diagonals.  The circ AV groove had luminal irregularities.  The OM1 was  large and had a  long proximal 50% lesion.  The OM2 was large with an osteal  75% lesion.  The RCA was dominant with a long proximal 75% lesion, mid 80%  lesion, and PDA osteal 90%.  Dr. [name] proceeded with PCI of the RCA  lesion, placing a Cypher 2.5 x 13 at the mid to distal 80% lesion, and a 28  x 2.5 Cypher in the proximal lesion reducing both to 0% residual.  The 90%  PDA branch was reduced to less than 30% with balloon angioplasty.  The  patient tolerated the procedure well.  Aspirin and Plavix combination is  recommended for six months.   POSTPROCEDURE:  The patient remained stable.  Groin sheath pulled without  complication.   On the day of discharge, the patient was ambulating without difficulty. No  chest pain.  Right groin stable with  minimal ecchymoses.  No bruit and  distal pulses intact.   Postprocedure labs showed WC 6.6, hemoglobin 15.4, and platelets 202.  Potassium 3.8, BUN 11, creatinine 1.3.   DISCHARGE INSTRUCTIONS:  No strenuous activity, lifting only 5 pounds, no  driving for 2 days.  Low fat, low cholesterol, low salt diet.  The patient  may shower.  The patient is instructed to call the office if problems or  questions.  She should followup with Dr. Percival Spanish in two weeks.  I have left  a message on the office phone.   DISCHARGE MEDICATIONS:  1. Plavix 75 mg daily for six months.  2. Metoprolol 25 mg one half tablet b.i.d. (new).  3. Enteric coated aspirin 325 mg daily.  4. Lipitor 10 mg daily.  5. Nitroglycerin 0.4 mg, one under the tongue as needed for chest pain.   Prescriptions given for Plavix and metoprolol.  Also for nitroglycerin.                                                Wilford Sports, P.A.    TCJ/MEDQ  D:  05/13/2004  T:  05/14/2004  Job:  [id number]

## 2011-05-04 NOTE — Cardiovascular Report (Signed)
NAME:  Timothy [name], Timothy [name]                        ACCOUNT NO.:  [id number]   MEDICAL RECORD NO.:  [id number]                   PATIENT TYPE:  OIB   LOCATION:  6533                                 FACILITY:  [facility]   PHYSICIAN:  Minus Breeding, M.D.                DATE OF BIRTH:  January 02, 1941   DATE OF PROCEDURE:  05/12/2004  DATE OF DISCHARGE:                              CARDIAC CATHETERIZATION   PROCEDURES PERFORMED:  1. Left heart catheterization.  2. Coronary arteriography.   CARDIOLOGIST:  Minus Breeding, M.D.   INDICATIONS:  Evaluate patient with chest pain and a Cardiolite suggesting  mid inferior inferoapical ischemia.   PROCEDURAL NOTE:  Left heart catheterization was performed via the right  femoral artery.  The artery was cannulated using anterior wall puncture.  A  #6 French arterial sheath was inserted via the modified Seldinger technique.  Preformed Judkins and a pigtail catheter were utilized.   The patient tolerated the procedure well and left the lab in stable  condition.   RESULTS:   HEMODYNAMIC DATA:  LV 135/12.  Aortic output 130/93.   ARTERIOGRAPHIC DATA:  Coronaries  Left Main:  The left main was normal.   LAD:  The LAD was calcified; long proximal 25-30% stenosis.  There was  distal 30-40% stenosis.  There was a small diagonal.  The third diagonal had  ostial 60% stenosis.   Circumflex Artery:  The circumflex and the AV groove had luminal  irregularities.  There was a large OM-1 with long proximal 50% stenosis.  There was an OM-2, which was large with ostial 25% stenosis.   Right Coronary Artery:  The right coronary artery was dominant.  There was a  proximal 75%.  There was a mid 80% stenosis.  The PDA had an ostial 90%  lesion.  There were three small posterolaterals following this.  The body of  the LAD had diffuse luminal irregularities.   VENTRICULOGRAPHIC DATA:  Left Ventriculogram:  Left ventriculogram was  obtained in the RAO projection.  The  EF was approximately 55%.   CONCLUSION:  High-grade right coronary artery disease, which has been  symptomatic and abnormal on Cardiolite.   PLAN:  I have reviewed this with Dr. [name] and we plan percutaneous  revascularization of the right coronary.                                               Minus Breeding, M.D.    JH/MEDQ  D:  05/12/2004  T:  05/13/2004  Job:  [id number]

## 2011-05-04 NOTE — Op Note (Signed)
   NAME:  Timothy [name], Timothy [name] NO.:  [id number]   MEDICAL RECORD NO.:  [id number]                   PATIENT TYPE:  AMB   LOCATION:  ENDO                                 FACILITY:  [facility]   PHYSICIAN:  John C. Amedeo Plenty, M.D.                 DATE OF BIRTH:  19-Apr-1941   DATE OF PROCEDURE:  04/28/2003  DATE OF DISCHARGE:                                 OPERATIVE REPORT   PROCEDURE:  Colonoscopy with polypectomy.   INDICATIONS FOR PROCEDURE:  Colon cancer screening.   DESCRIPTION OF PROCEDURE:  The patient was placed in the left lateral  decubitus position and placed on the pulse monitor with continuous low-flow  oxygen delivered by nasal cannula.  He was sedated with 75 mcg IV fentanyl  and 6 mg IV Versed.  The Olympus video colonoscope was inserted into the  rectum and advanced to the cecum, confirmed by transillumination at  McBurney's point and visualization of the ileocecal valve and appendiceal  orifice.  The prep was good.  The cecum appeared normal.  In the proximal  ascending colon, there was 1 x 0.5 cm sessile polyp which was fulgurated by  snare.  The remainder of the ascending colon appeared normal.  In the  proximal transverse colon, there was a round 1.2 cm sessile polyp which was  removed by snare and sent in the same specimen container as the ascending  colon polyp.  The remainder of the transverse, descending, sigmoid, and  rectum  appeared normal with no further polyps, masses, diverticula, or  other mucosal abnormalities.  The rectum likewise appeared normal, and  retroflexed view of the anus revealed no obvious internal hemorrhoids.  The  colonoscope was then withdrawn, and the patient returned to the recovery  room in stable condition.  He tolerated the procedure well, and there were  no immediate complications.   IMPRESSION:  1. Transverse and ascending colon polyps.  2. Otherwise, normal study.   PLAN:  Await histology to determine  interval for next colonoscopy.                                               John C. Amedeo Plenty, M.D.    JCH/MEDQ  D:  04/28/2003  T:  04/29/2003  Job:  [id number]   cc:   Chipper Herb, M.D.  [address] [place]  [place] [address]  Fax: [phone]

## 2011-05-04 NOTE — Cardiovascular Report (Signed)
NAME:  Timothy [name], Timothy [name]                        ACCOUNT NO.:  [id number]   MEDICAL RECORD NO.:  [id number]                   PATIENT TYPE:  OIB   LOCATION:  6533                                 FACILITY:  [place] [facility]   PHYSICIAN:  Timothy [name], M.D. [facility]         DATE OF BIRTH:  10-Feb-1941   DATE OF PROCEDURE:  05/12/2004  DATE OF DISCHARGE:  05/13/2004                              CARDIAC CATHETERIZATION   INDICATIONS:  Timothy [name] is a 70 year old gentleman who is seen by Dr.  Percival [name].  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  He had had prior percutaneous intervention of the right  coronary artery.  Diagnostic catheterization revealed a moderate stenosis of  the circumflex marginal, segmental plaquing of the mid LAD as well as small  diagonals, and two 80% lesions in the right coronary artery followed by an  80-90% lesion at the ostium of the posterior descending branch.  The risks,  benefits and alternatives were discussed with the patient in detail.  He had  been previously catheterized and 600 mg of oral Clopidogrel given as  pretreatment.  He was brought to the laboratory after informed consent.   PROCEDURE:  1. Percutaneous stenting of the proximal right coronary artery.  2. Percutaneous stenting of the distal right coronary artery.  3. Percutaneous angioplasty of the proximal portion of the posterior     descending branch.   DESCRIPTION OF PROCEDURE:  The patient was brought to the catheterization  laboratory and prepped and draped in the usual fashion.  Using double glove  technique and after sterile preparation, the femoral sheath was removed and  exchanged for a 7 French femoral sheath.  Bivalirudin was given according to  protocol.  A JR4 guiding catheter with side-holes was utilized and an ACT  recorded.  Following this, a traverse wire was taken down the right coronary  and across the posterior descending stenosis.  Initial  predilatation was  done with a 2.25 x 12 Quantum Maverick balloon in the proximal mid lesion,  and also in the distal lesion.  We elected not to cross the posterior  descending branch initially.  Following this, a 2.5 x 13 Cypher stent was  placed at the distal lesion.  This was taken up to 14 atmospheres.  There  was marked improvement in the appearance of the artery.  Following this, the  more proximal stent was placed which was a 28 x 2.5 mm Cypher stent.  This  was placed not only at the area of 80% narrowing but at the area of diffuse  50% segmental disease.  This was also taken up to 14 atmospheres.  Post  dilatation of both stents were then completed using a 2.75 x 8 power sail  balloon taken up to moderately high pressures.  This balloon was  subsequently removed and there was excellent angiographic appearance of the  proximal and mid vessel.  We  then attempted to pass a 2.5 x 6 cutting  balloon down the artery but were unable to negotiate the bend in the  midportion of the vessel.  Following this, a BMW wire was placed on the  posterolateral branch to use as a buddy system, and even with the buddy wire  we were unable to get the cutting balloon down the right coronary.  This was  followed by passage of a voyager balloon which was taken successfully across  the PDA stenosis and then taken up on three separate occasions to 8  atmospheres.  There was marked improvement in the appearance of the  posterior descending branch.  We then waited for about 10-15 minutes to  watch this vessel and final dilatation was performed.  There was very little  in the way of elastic recoil.  All catheters and wires were then  subsequently removed, the femoral sheath was sewn into place and he was  taken to the holding area in satisfactory clinical condition.   ANGIOGRAPHIC DATA:  The proximal right coronary artery was stent with a 2.5  x 28 mm stent and post dilated to 2.75 with the power sail balloon.   The 80%  stenosis was reduced to 0%.  Distal to this area and proximal to the distal  stent, there was an area of mild segmental plaquing.  The distal vessel was  stented using a 2.5 x 13 Cypher stent which was post dilated with a 2.75 mm  balloon as well.  This stenosis was reduced from 80 to 0%.  Beyond the stent  the vessel opened up, then there was a segmental area of plaque that  proceeded and then improved just prior to the takeoff of the PDA.  The PDA  proximally has a 90% focal stenosis.  This area was dilated with a 2.5 mm  Voyager balloon laid across the stenosis which was about 6 mm in length.  The stenosis was reduced from 90% down to about 30% and did not exhibit  elastic recoil.  There was TIMI-3 flow throughout.  There was minimal  pinching of the posterolateral branch with dilatation of the PDA and this  did not appear to be flow-limiting.   CONCLUSIONS:  1. Successful percutaneous stenting of the proximal right coronary artery.  2. Successful percutaneous stenting of the distal right coronary artery.  3. Successful percutaneous angioplasty of the proximal posterior descending     branch.   DISPOSITION:  The patient will be treated with aspirin and Plavix.  The  ischemia on Cardiolite imaging, according to Dr. Percival [name], corresponds to  the right coronary territory.  He has other disease and will need to be  treated medically with both aggressive risk factor reduction.  Fortunately,  the patient does not smoke.  A course of aspirin and Plavix for six months  would be recommended.                                               Timothy [name], M.D. [facility]    TDS/MEDQ  D:  05/12/2004  T:  05/14/2004  Job:  [id number]   cc:   CV Laboratory   Minus Breeding, M.D.   Chipper Herb, M.D.  [address] [place]  [place] [address]  Fax: [phone]   Patient's Medical Record

## 2011-06-27 ENCOUNTER — Encounter: Payer: Self-pay | Admitting: Cardiology

## 2011-06-27 ENCOUNTER — Ambulatory Visit (INDEPENDENT_AMBULATORY_CARE_PROVIDER_SITE_OTHER): Payer: Medicare Other | Admitting: Cardiology

## 2011-06-27 VITALS — BP 118/72 | HR 77 | Resp 16 | Ht 65.0 in | Wt 193.0 lb

## 2011-06-27 DIAGNOSIS — E785 Hyperlipidemia, unspecified: Secondary | ICD-10-CM

## 2011-06-27 DIAGNOSIS — I2510 Atherosclerotic heart disease of native coronary artery without angina pectoris: Secondary | ICD-10-CM

## 2011-06-27 DIAGNOSIS — E663 Overweight: Secondary | ICD-10-CM

## 2011-06-27 DIAGNOSIS — I10 Essential (primary) hypertension: Secondary | ICD-10-CM

## 2011-06-27 MED ORDER — ATORVASTATIN CALCIUM 80 MG PO TABS: 80.0000 mg | ORAL_TABLET | Freq: Every day | ORAL | Status: DC

## 2011-06-27 MED ORDER — PANTOPRAZOLE SODIUM 40 MG PO TBEC: 40.0000 mg | DELAYED_RELEASE_TABLET | Freq: Every day | ORAL | Status: DC

## 2011-06-27 MED ORDER — CLOPIDOGREL BISULFATE 75 MG PO TABS: 75.0000 mg | ORAL_TABLET | Freq: Every day | ORAL | Status: DC

## 2011-06-27 NOTE — Assessment & Plan Note (Signed)
The patient has no new sypmtoms.  No further cardiovascular testing is indicated.  We will continue with aggressive risk reduction and meds as listed.  

## 2011-06-27 NOTE — Assessment & Plan Note (Signed)
The blood pressure is at target. No change in medications is indicated. We will continue with therapeutic lifestyle changes (TLC).  

## 2011-06-27 NOTE — Patient Instructions (Signed)
Please continue current medications as listed. Follow up in 1 year with Dr Percival Spanish.  You will receive a letter in the mail 2 months before you are due.  Please call us when you receive this letter to schedule your follow up appointment.

## 2011-06-27 NOTE — Progress Notes (Signed)
HPI The patient presents for one year followup. Since I last saw him he has been doing well. He denies any chest pressure, neck or arm discomfort. He has had no new health, presyncope or syncope. He works hard and does not require nitroglycerin. He is following his diet. He has close followup of his diabetes and his lipids  Allergies  Allergen Reactions  . Hydrocodone-Acetaminophen   . Zetia (Ezetimibe)     Current Outpatient Prescriptions  Medication Sig Dispense Refill  . amLODipine-olmesartan (AZOR) 5-40 MG per tablet Take 1 tablet by mouth daily.        Marland Kitchen aspirin 81 MG EC tablet Take 81 mg by mouth daily.        Marland Kitchen atorvastatin (LIPITOR) 80 MG tablet Take 80 mg by mouth daily.        . Cholecalciferol (VITAMIN D) 1000 UNITS capsule Take 1,000 Units by mouth daily.        . Choline Fenofibrate (TRILIPIX) 135 MG capsule Take 135 mg by mouth daily.        . clopidogrel (PLAVIX) 75 MG tablet Take 75 mg by mouth daily.        Marland Kitchen ezetimibe (ZETIA) 10 MG tablet Take 10 mg by mouth daily.        . hydrochlorothiazide 25 MG tablet Take 25 mg by mouth daily.        . metoprolol (LOPRESSOR) 50 MG tablet Take 50 mg by mouth daily.        . niacin (NIASPAN) 1000 MG CR tablet Take 1,000 mg by mouth at bedtime.        . nitroGLYCERIN (NITROSTAT) 0.4 MG SL tablet Place 0.4 mg under the tongue every 5 (five) minutes as needed.        . Omega-3 Fatty Acids (FISH OIL) 1000 MG CAPS Take 2 capsules by mouth daily.        . pantoprazole (PROTONIX) 40 MG tablet Take 40 mg by mouth daily.        . Saxagliptin-Metformin (KOMBIGLYZE XR) 2.04-999 MG TB24 Take 1 tablet by mouth 2 (two) times daily.          Past Medical History  Diagnosis Date  . ASCVD (arteriosclerotic cardiovascular disease)   . Essential hypertension, benign   . Hyperplasia of prostate   . 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 Colon polyp   . Coronary artery disease     (left main normal, LAD 25-30% stenosis,  distal 30-40% stenosis, circumflex obtuse marginal 50% stenosis,  right coronary artery dominant with long 75% followed by mid 80%  stenosis followed by 9% stenosis at the ostium of the PDA.  He had  stenting of the PDA by Dr. [name].  This was a Cypher stent.   First angioplasty was 1994, Cath 2011 100% RCA ).        Past Surgical History  Procedure Date  . Bilateral hernia repair   . Angioplasty with 3 stents   . Androplasty   . Cholestectomy   . Appendectomy     ROS:  As stated in the HPI and negative for all other systems.  PHYSICAL EXAM BP 118/72  Pulse 77  Resp 16  Ht 5\' 5"  (1.651 m)  Wt 193 lb (87.544 kg)  BMI 32.12 kg/m2 GENERAL:  Well appearing HEENT:  Pupils equal round and reactive, fundi not visualized, oral mucosa unremarkable NECK:  No jugular venous distention, waveform within normal limits, carotid upstroke brisk  and symmetric, no bruits, no thyromegaly LYMPHATICS:  No cervical, inguinal adenopathy LUNGS:  Clear to auscultation bilaterally BACK:  No CVA tenderness CHEST:  Unremarkable HEART:  PMI not displaced or sustained,S1 and S2 within normal limits, no S3, no S4, no clicks, no rubs, no murmurs ABD:  Flat, positive bowel sounds normal in frequency in pitch, no bruits, no rebound, no guarding, no midline pulsatile mass, no hepatomegaly, no splenomegaly EXT:  2 plus pulses throughout, no edema, no cyanosis no clubbing SKIN:  No rashes no nodules NEURO:  Cranial nerves II through XII grossly intact, motor grossly intact throughout PSYCH:  Cognitively intact, oriented to person place and time   EKG: Sinus rhythm, rate 74, axis within normal limits, intervals within normal limits, no acute ST-T wave changes, early transition   ASSESSMENT AND PLAN

## 2011-06-27 NOTE — Assessment & Plan Note (Signed)
We did discuss this as has Dr. Laurance Flatten.  He will watch the calories.

## 2011-06-27 NOTE — Assessment & Plan Note (Signed)
His last LDL in June was 52 with and HDL of 25.  I will defer to the expert management of [facility] lipid clinic.

## 2011-09-18 LAB — CBC
HCT: 40.1
Hemoglobin: 13.4
MCHC: 33.4
MCV: 94.2
Platelets: 157
RBC: 4.25
RDW: 13.4
WBC: 5.8

## 2011-09-18 LAB — DIFFERENTIAL
Basophils Absolute: 0
Basophils Relative: 0
Eosinophils Absolute: 0.1
Eosinophils Relative: 2
Lymphocytes Relative: 35
Lymphs Abs: 2
Monocytes Absolute: 0.5
Monocytes Relative: 8
Neutro Abs: 3.2
Neutrophils Relative %: 55

## 2011-09-18 LAB — COMPREHENSIVE METABOLIC PANEL
ALT: 40
AST: 35
Albumin: 3.8
Alkaline Phosphatase: 50
BUN: 15
CO2: 25
Calcium: 9
Chloride: 102
Creatinine, Ser: 1.5
GFR calc Af Amer: 56 — ABNORMAL LOW
GFR calc non Af Amer: 47 — ABNORMAL LOW
Glucose, Bld: 180 — ABNORMAL HIGH
Potassium: 3.8
Sodium: 135
Total Bilirubin: 0.7
Total Protein: 6.7

## 2011-09-18 LAB — GLUCOSE, CAPILLARY: Glucose-Capillary: 167 — ABNORMAL HIGH

## 2011-09-25 ENCOUNTER — Encounter: Payer: Self-pay | Admitting: Cardiology

## 2012-07-15 ENCOUNTER — Encounter: Payer: Self-pay | Admitting: Cardiology

## 2012-08-01 ENCOUNTER — Telehealth: Payer: Self-pay

## 2012-08-01 NOTE — Telephone Encounter (Signed)
Call patient to make appointment for clearence of a  colonscopy but patient had appointment on 08/13/2012 @ 200pm

## 2012-08-13 ENCOUNTER — Ambulatory Visit (INDEPENDENT_AMBULATORY_CARE_PROVIDER_SITE_OTHER): Payer: Medicare Other | Admitting: Cardiology

## 2012-08-13 ENCOUNTER — Encounter: Payer: Self-pay | Admitting: Cardiology

## 2012-08-13 VITALS — BP 105/70 | HR 77 | Ht 67.0 in | Wt 193.0 lb

## 2012-08-13 DIAGNOSIS — I2510 Atherosclerotic heart disease of native coronary artery without angina pectoris: Secondary | ICD-10-CM

## 2012-08-13 DIAGNOSIS — E785 Hyperlipidemia, unspecified: Secondary | ICD-10-CM

## 2012-08-13 DIAGNOSIS — I10 Essential (primary) hypertension: Secondary | ICD-10-CM

## 2012-08-13 DIAGNOSIS — I7090 Unspecified atherosclerosis: Secondary | ICD-10-CM

## 2012-08-13 NOTE — Progress Notes (Signed)
HPI The patient presents for one year followup. Since I last saw him he has been doing well. The patient denies any new symptoms such as chest discomfort, neck or arm discomfort. There has been no new shortness of breath, PND or orthopnea. There have been no reported palpitations, presyncope or syncope.  He doesn't exercise routinely but he is active.  Allergies  Allergen Reactions  . Hydrocodone-Acetaminophen   . Zetia (Ezetimibe)     Current Outpatient Prescriptions  Medication Sig Dispense Refill  . amLODipine-olmesartan (AZOR) 5-40 MG per tablet Take 1 tablet by mouth daily.        Marland Kitchen aspirin 81 MG EC tablet Take 81 mg by mouth daily.        Marland Kitchen atorvastatin (LIPITOR) 80 MG tablet Take 1 tablet (80 mg total) by mouth daily.  90 tablet  3  . Cholecalciferol (VITAMIN D) 1000 UNITS capsule Take 1,000 Units by mouth daily.        . Choline Fenofibrate (TRILIPIX) 135 MG capsule Take 135 mg by mouth daily.        . clopidogrel (PLAVIX) 75 MG tablet Take 1 tablet (75 mg total) by mouth daily.  90 tablet  3  . ezetimibe (ZETIA) 10 MG tablet Take 10 mg by mouth daily.        . hydrochlorothiazide 25 MG tablet Take 25 mg by mouth daily.        . metoprolol (LOPRESSOR) 50 MG tablet Take 50 mg by mouth daily.        . niacin (NIASPAN) 1000 MG CR tablet Take 1,000 mg by mouth at bedtime.        . nitroGLYCERIN (NITROSTAT) 0.4 MG SL tablet Place 0.4 mg under the tongue every 5 (five) minutes as needed.        . Omega-3 Fatty Acids (FISH OIL) 1000 MG CAPS Take 2 capsules by mouth daily.        . pantoprazole (PROTONIX) 40 MG tablet Take 1 tablet (40 mg total) by mouth daily.  90 tablet  3  . Saxagliptin-Metformin (KOMBIGLYZE XR) 2.04-999 MG TB24 Take 1 tablet by mouth 2 (two) times daily.          Past Medical History  Diagnosis Date  . ASCVD (arteriosclerotic cardiovascular disease)   . Essential hypertension, benign   . Hyperplasia of prostate   . Megaloblastic anemia due to decreased intake  of vitamin B12   . Colon polyp   . Coronary artery disease     (left main normal, LAD 25-30% stenosis, distal 30-40% stenosis, circumflex obtuse marginal 50% stenosis,  right coronary artery dominant with long 75% followed by mid 80%  stenosis followed by 9% stenosis at the ostium of the PDA.  He had  stenting of the PDA by Dr. [name].  This was a Cypher stent.   First angioplasty was 1994, Cath 2011 100% RCA ).        Past Surgical History  Procedure Date  . Bilateral hernia repair   . Angioplasty with 3 stents   . Androplasty   . Cholestectomy   . Appendectomy     ROS:  As stated in the HPI and negative for all other systems.  PHYSICAL EXAM BP 105/70  Pulse 77  Ht 5\' 7"  (1.702 m)  Wt 193 lb (87.544 kg)  BMI 30.23 kg/m2 GENERAL:  Well appearing HEENT:  Pupils equal round and reactive, fundi not visualized, oral mucosa unremarkable NECK:  No jugular venous distention, waveform  within normal limits, carotid upstroke brisk and symmetric, no bruits, no thyromegaly LYMPHATICS:  No cervical, inguinal adenopathy LUNGS:  Clear to auscultation bilaterally BACK:  No CVA tenderness CHEST:  Unremarkable HEART:  PMI not displaced or sustained,S1 and S2 within normal limits, no S3, no S4, no clicks, no rubs, no murmurs ABD:  Flat, positive bowel sounds normal in frequency in pitch, no bruits, no rebound, no guarding, no midline pulsatile mass, no hepatomegaly, no splenomegaly EXT:  2 plus pulses throughout, no edema, no cyanosis no clubbing SKIN:  No rashes no nodules NEURO:  Cranial nerves II through XII grossly intact, motor grossly intact throughout [facility]:  Cognitively intact, oriented to person place and time   EKG: Sinus rhythm, rate 77, axis within normal limits, intervals within normal limits, nonspecific ST-T wave changes, early transition in lead V2 , no change from previous  08/13/2012   ASSESSMENT AND PLAN  CAD -  The patient has no new sypmtoms. No further cardiovascular  testing is indicated. We will continue with aggressive risk reduction and meds as listed. Of note he can discontinue his Plavix for an upcoming colonoscopy.  There is no reason to restart this.    Essential hypertension, benign -  The blood pressure is at target. No change in medications is indicated. We will continue with therapeutic lifestyle changes (TLC).   DYSLIPIDEMIA -  I reviewed his recent lipids. His LDL was 51 and HDL 22. He's had his meds further adjusted by Dr. Laurance Flatten and I will defer to his expertise.  Overweight -  We discussed the need for exercise and weight loss.

## 2012-08-13 NOTE — Patient Instructions (Addendum)
Please stop Plavix Continue all other medications as listed.  Follow up in 1 year with Dr Percival Spanish.  You will receive a letter in the mail 2 months before you are due.  Please call us when you receive this letter to schedule your follow up appointment.

## 2012-08-15 ENCOUNTER — Telehealth: Payer: Self-pay | Admitting: Cardiology

## 2012-08-15 NOTE — Telephone Encounter (Signed)
Patient is scheduled for a colonoscopy on 08/26/12. Dr. Amedeo Plenty would like for pt to hold Plavix 5 days prior the procedure.

## 2012-08-15 NOTE — Telephone Encounter (Signed)
Please return call to [place]-Dr. Teena Irani  [phone]  Regarding pt plavix hold for 08-26-12 procedure

## 2012-08-15 NOTE — Telephone Encounter (Signed)
Timothy [name] Dr. Teena [name] nurse is aware, that pt does not take Plavix anymore medication was stop on 08/13/12.

## 2012-11-04 ENCOUNTER — Encounter: Payer: Self-pay | Admitting: Cardiology

## 2012-12-23 ENCOUNTER — Other Ambulatory Visit: Payer: Self-pay | Admitting: Dermatology

## 2012-12-23 DIAGNOSIS — C439 Malignant melanoma of skin, unspecified: Secondary | ICD-10-CM

## 2012-12-23 HISTORY — DX: Malignant melanoma of skin, unspecified: C43.9

## 2013-01-06 ENCOUNTER — Other Ambulatory Visit: Payer: Self-pay | Admitting: Family Medicine

## 2013-01-06 DIAGNOSIS — E291 Testicular hypofunction: Secondary | ICD-10-CM

## 2013-01-09 ENCOUNTER — Ambulatory Visit
Admission: RE | Admit: 2013-01-09 | Discharge: 2013-01-09 | Disposition: A | Discharge status: Home / Self Care | Payer: Medicare Other | Source: Ambulatory Visit | Attending: Family Medicine | Admitting: Family Medicine

## 2013-01-09 DIAGNOSIS — E291 Testicular hypofunction: Secondary | ICD-10-CM

## 2013-01-09 MED ORDER — GADOBENATE DIMEGLUMINE 529 MG/ML IV SOLN
9.0000 mL | Freq: Once | INTRAVENOUS | Status: AC | PRN
  Administered 2013-01-09: 9 mL via INTRAVENOUS

## 2013-03-23 ENCOUNTER — Ambulatory Visit (INDEPENDENT_AMBULATORY_CARE_PROVIDER_SITE_OTHER): Payer: Medicare Other | Admitting: Pharmacist

## 2013-03-23 VITALS — BP 112/67 | HR 77 | Ht 67.0 in | Wt 193.0 lb

## 2013-03-23 DIAGNOSIS — R7989 Other specified abnormal findings of blood chemistry: Secondary | ICD-10-CM

## 2013-03-23 DIAGNOSIS — E291 Testicular hypofunction: Secondary | ICD-10-CM

## 2013-03-23 DIAGNOSIS — E782 Mixed hyperlipidemia: Secondary | ICD-10-CM

## 2013-03-23 MED ORDER — TESTOSTERONE CYPIONATE 200 MG/ML IM SOLN
200.0000 mg | INTRAMUSCULAR | Status: DC
  Administered 2013-03-23: 200 mg via INTRAMUSCULAR

## 2013-03-23 MED ORDER — CHOLINE FENOFIBRATE 135 MG PO CPDR: 135.0000 mg | DELAYED_RELEASE_CAPSULE | Freq: Every day | ORAL | Status: DC

## 2013-03-23 MED ORDER — NIACIN ER (ANTIHYPERLIPIDEMIC) 1000 MG PO TBCR: 1000.0000 mg | EXTENDED_RELEASE_TABLET | Freq: Every day | ORAL | Status: DC

## 2013-03-23 MED ORDER — ICOSAPENT ETHYL 1 G PO CAPS: 2.0000 g | ORAL_CAPSULE | Freq: Two times a day (BID) | ORAL | Status: DC

## 2013-03-23 NOTE — Progress Notes (Signed)
Lipid Clinic Consultation  Chief Complaint:   Chief Complaint  Patient presents with  . Hyperlipidemia      Filed Vitals:   03/23/13 1455  BP: 112/67  Pulse: 77   General Appearance:  obese Mood/Affect:  normal  HPI:  Patient is currently on multiple lipid lowering therapies:  Crestor 20mg  qd, Zetia 10mg  qd, Niaspan 1000mg  qd, trilipex 135mg  qd and vasepa 2 grams bid. Despite compliance with medications his mixed hyperlipidemia remains poorly controlled  Most recent NMR [employer] lipomed panel and 02/24/2013 showed : LDL-P 899,   Tg = 438, HDL = 25 and total chol = 138  Pt does have type 2 diabetes and last A1C was 6.2% (02/24/13)  No results found for this basename: chol, trig, hdl, ldl, ldldirect, vldl, cholhdl    Assessment: CHD/CHF Risk Equivalents:  CAD   Primary Problem(s):  LDL or LDL-P elevated and TG elevated  Current NCEP Goals: LDL Goal < 70 HDL Goal >/= 40 Tg Goal < 150 Non-HDL Goal < 100  Secondary cause of hyperlipidemia present:  Type 2 diabetes Low fat diet followed?  Yes - sometimes does not follow plan Low carb diet followed?  Yes - but sometimes is not compliant Exercise?  Yes - recently started walking   Recommendations: None currently - will discuss possibilities with other clinical pharmacist, Memory Argue. Continue with increase exercise Recheck Lipid Panel:  Will check Tg in 2 weeks when rechecks testosterone leves

## 2013-03-25 ENCOUNTER — Telehealth: Payer: Self-pay | Admitting: Pharmacist

## 2013-03-25 NOTE — Telephone Encounter (Signed)
Discussed patient's very elevated Tg with Memory Argue.  She made several suggestions: 1. Check TSH - this was normal 10/2012 2. Discontinue all fish oil products (Vascepa) 3. No alcohol consumption - verified with pt that he does not drink alcohol 4. Limit CHO/sugar intake - patient has been advised of this multiple times in past.  Sometimes non compliant.  Patient called and aware of above recommendation.   Cherre Robins, PharmD, CPP

## 2013-03-30 ENCOUNTER — Telehealth: Payer: Self-pay | Admitting: Pharmacist

## 2013-03-30 NOTE — Telephone Encounter (Signed)
Pt called to remind labs due. Pt coming in for labs April 06, 2013

## 2013-04-08 ENCOUNTER — Other Ambulatory Visit (INDEPENDENT_AMBULATORY_CARE_PROVIDER_SITE_OTHER): Payer: Medicare Other

## 2013-04-08 DIAGNOSIS — E785 Hyperlipidemia, unspecified: Secondary | ICD-10-CM

## 2013-04-08 DIAGNOSIS — E291 Testicular hypofunction: Secondary | ICD-10-CM

## 2013-04-08 LAB — LIPID PANEL
Cholesterol: 112 mg/dL (ref 0–200)
HDL: 23 mg/dL — ABNORMAL LOW (ref >39)
LDL Cholesterol: 34 mg/dL (ref 0–99)
Total CHOL/HDL Ratio: 4.9 Ratio
Triglycerides: 276 mg/dL — ABNORMAL HIGH (ref <150)
VLDL: 55 mg/dL — ABNORMAL HIGH (ref 0–40)

## 2013-04-08 NOTE — Progress Notes (Signed)
Patient came in for labs only.

## 2013-04-09 LAB — TESTOSTERONE, TOTAL AND FREE DIRECT MEASURE
Free Testosterone, Direct: 2.1 pg/mL — ABNORMAL LOW (ref 3.8–34.2)
Testosterone: 587 ng/dL (ref 300–890)

## 2013-05-18 ENCOUNTER — Other Ambulatory Visit: Payer: Self-pay | Admitting: Family Medicine

## 2013-06-02 ENCOUNTER — Other Ambulatory Visit: Payer: Self-pay | Admitting: Dermatology

## 2013-06-15 ENCOUNTER — Other Ambulatory Visit: Payer: Self-pay

## 2013-06-15 MED ORDER — ROSUVASTATIN CALCIUM 20 MG PO TABS: 20.0000 mg | ORAL_TABLET | Freq: Every day | ORAL | Status: DC

## 2013-06-30 ENCOUNTER — Other Ambulatory Visit: Payer: Self-pay | Admitting: *Deleted

## 2013-06-30 MED ORDER — METOPROLOL SUCCINATE ER 50 MG PO TB24: 50.0000 mg | ORAL_TABLET | Freq: Every day | ORAL | Status: DC

## 2013-06-30 NOTE — Telephone Encounter (Signed)
PT WANTS #90 SUPPLY.

## 2013-07-13 ENCOUNTER — Other Ambulatory Visit: Payer: Self-pay | Admitting: Pharmacist

## 2013-07-13 ENCOUNTER — Other Ambulatory Visit: Payer: Self-pay

## 2013-07-13 MED ORDER — ROSUVASTATIN CALCIUM 20 MG PO TABS: 20.0000 mg | ORAL_TABLET | Freq: Every day | ORAL | Status: DC

## 2013-07-13 MED ORDER — METOPROLOL SUCCINATE ER 50 MG PO TB24: 50.0000 mg | ORAL_TABLET | Freq: Every day | ORAL | Status: DC

## 2013-07-13 MED ORDER — HYDROCHLOROTHIAZIDE 25 MG PO TABS: 25.0000 mg | ORAL_TABLET | Freq: Every day | ORAL | Status: DC

## 2013-07-13 NOTE — Telephone Encounter (Signed)
Metoprolol on med list as Lopressor and Troprol XL  Don't know which on the patient takes  These meds being requested for mail order If approved print and have patient come pick up

## 2013-07-13 NOTE — Telephone Encounter (Signed)
Last seen 03/25/13 Timothy [name]  Last labs 04/08/13  Metoprolol (Lopressor) 50 and Toprol XL 50 mg both on med list  Don't know which on is correct  This is being requested by mail order  If approved print and have patient come pick up

## 2013-07-13 NOTE — Telephone Encounter (Signed)
Patient called - rx's at front desk.

## 2013-08-12 ENCOUNTER — Encounter: Payer: Self-pay | Admitting: Cardiology

## 2013-08-12 ENCOUNTER — Ambulatory Visit (INDEPENDENT_AMBULATORY_CARE_PROVIDER_SITE_OTHER): Payer: Medicare Other | Admitting: Cardiology

## 2013-08-12 VITALS — BP 132/74 | HR 85 | Ht 67.0 in | Wt 193.0 lb

## 2013-08-12 DIAGNOSIS — I7090 Unspecified atherosclerosis: Secondary | ICD-10-CM

## 2013-08-12 DIAGNOSIS — I2510 Atherosclerotic heart disease of native coronary artery without angina pectoris: Secondary | ICD-10-CM

## 2013-08-12 NOTE — Patient Instructions (Addendum)
The current medical regimen is effective;  continue present plan and medications.  Follow up in 1 year with Dr Hochrein.  You will receive a letter in the mail 2 months before you are due.  Please call us when you receive this letter to schedule your follow up appointment.  

## 2013-08-12 NOTE — Progress Notes (Signed)
HPI The patient presents for one year followup. Since I last saw him he has been doing well. The patient denies any new symptoms such as chest discomfort, neck or arm discomfort. There has been no new shortness of breath, PND or orthopnea. There have been no reported palpitations, presyncope or syncope.  He doesn't exercise routinely but he is active.  Allergies  Allergen Reactions  . Hydrocodone-Acetaminophen   . Zetia [Ezetimibe]     Current Outpatient Prescriptions  Medication Sig Dispense Refill  . amLODipine-olmesartan (AZOR) 5-40 MG per tablet Take 1 tablet by mouth daily.        Marland Kitchen aspirin 81 MG EC tablet Take 81 mg by mouth daily.        . Choline Fenofibrate (TRILIPIX) 135 MG capsule Take 1 capsule (135 mg total) by mouth daily.  90 capsule  1  . ezetimibe (ZETIA) 10 MG tablet Take 10 mg by mouth daily.        . hydrochlorothiazide (HYDRODIURIL) 25 MG tablet Take 1 tablet (25 mg total) by mouth daily.  90 tablet  0  . metoprolol succinate (TOPROL-XL) 50 MG 24 hr tablet Take 1 tablet (50 mg total) by mouth daily. Take with or immediately following a meal.  90 tablet  0  . niacin (NIASPAN) 1000 MG CR tablet Take 1 tablet (1,000 mg total) by mouth at bedtime.  90 tablet  1  . nitroGLYCERIN (NITROSTAT) 0.4 MG SL tablet Place 0.4 mg under the tongue every 5 (five) minutes as needed.        . rosuvastatin (CRESTOR) 20 MG tablet Take 1 tablet (20 mg total) by mouth daily.  90 tablet  0  . Saxagliptin-Metformin (KOMBIGLYZE XR) 2.04-999 MG TB24 Take 1 tablet by mouth 2 (two) times daily.        [name] (VASCEPA) 1 G CAPS Take 2 g by mouth 2 (two) times daily.  480 capsule  1   Current Facility-Administered Medications  Medication Dose Route Frequency Provider Last Rate Last Dose  . testosterone cypionate (DEPOTESTOTERONE CYPIONATE) injection 200 mg  200 mg Intramuscular Q28 days Tammy Eckard, PHARMD   200 mg at 03/23/13 1530    Past Medical History  Diagnosis Date  . ASCVD  (arteriosclerotic cardiovascular disease)   . Essential hypertension, benign   . Hyperplasia of prostate   . 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 Colon polyp   . Coronary artery disease     (left main normal, LAD 25-30% stenosis, distal 30-40% stenosis, circumflex obtuse marginal 50% stenosis,  right coronary artery dominant with long 75% followed by mid 80%  stenosis followed by 9% stenosis at the ostium of the PDA.  He had  stenting of the PDA by Dr. [name].  This was a Cypher stent.   First angioplasty was 1994, Cath 2011 100% RCA ).        Past Surgical History  Procedure Laterality Date  . Bilateral hernia repair    . Angioplasty with 3 stents    . Androplasty    . Cholestectomy    . Appendectomy    . Cervical spine surgery      ROS:  As stated in the HPI and negative for all other systems.  PHYSICAL EXAM BP 132/74  Pulse 85  Ht 5\' 7"  (1.702 m)  Wt 193 lb (87.544 kg)  BMI 30.22 kg/m2 GENERAL:  Well appearing HEENT:  Pupils equal round and reactive, fundi not visualized,  oral mucosa unremarkable NECK:  No jugular venous distention, waveform within normal limits, carotid upstroke brisk and symmetric, no bruits, no thyromegaly LYMPHATICS:  No cervical, inguinal adenopathy LUNGS:  Clear to auscultation bilaterally BACK:  No CVA tenderness CHEST:  Unremarkable HEART:  PMI not displaced or sustained,S1 and S2 within normal limits, no S3, no S4, no clicks, no rubs, no murmurs ABD:  Flat, positive bowel sounds normal in frequency in pitch, no bruits, no rebound, no guarding, no midline pulsatile mass, no hepatomegaly, no splenomegaly EXT:  2 plus pulses throughout, no edema, no cyanosis no clubbing SKIN:  No rashes no nodules NEURO:  Cranial nerves II through XII grossly intact, motor grossly intact throughout [facility]:  Cognitively intact, oriented to person place and time   EKG: Sinus rhythm, rate 78, axis within normal limits, intervals within normal  limits, nonspecific ST-T wave changes, early transition in lead V2 , no change from previous  08/12/2013   ASSESSMENT AND PLAN  CAD -  The patient has no new sypmtoms since his last cath. No further cardiovascular testing is indicated. We will continue with aggressive risk reduction and meds as listed.    Essential hypertension, benign -  The blood pressure is at target. No change in medications is indicated. We will continue with therapeutic lifestyle changes (TLC).   DYSLIPIDEMIA -  I reviewed his recent lipids. His LDL was 34.  He's had his meds further adjusted by Dr. Laurance Flatten and I will defer to his expertise.

## 2013-08-19 ENCOUNTER — Other Ambulatory Visit (INDEPENDENT_AMBULATORY_CARE_PROVIDER_SITE_OTHER): Payer: Medicare Other

## 2013-08-19 DIAGNOSIS — E559 Vitamin D deficiency, unspecified: Secondary | ICD-10-CM

## 2013-08-19 DIAGNOSIS — E291 Testicular hypofunction: Secondary | ICD-10-CM

## 2013-08-19 DIAGNOSIS — IMO0001 Reserved for inherently not codable concepts without codable children: Secondary | ICD-10-CM

## 2013-08-19 DIAGNOSIS — I10 Essential (primary) hypertension: Secondary | ICD-10-CM

## 2013-08-19 DIAGNOSIS — Z79899 Other long term (current) drug therapy: Secondary | ICD-10-CM

## 2013-08-19 DIAGNOSIS — E785 Hyperlipidemia, unspecified: Secondary | ICD-10-CM

## 2013-08-19 LAB — POCT GLYCOSYLATED HEMOGLOBIN (HGB A1C): Hemoglobin A1C: 5.7

## 2013-08-21 LAB — NMR, LIPOPROFILE
Cholesterol: 111 mg/dL (ref <200)
HDL Cholesterol by NMR: 25 mg/dL — ABNORMAL LOW (ref >=40)
HDL Particle Number: 25.1 umol/L — ABNORMAL LOW (ref >=30.5)
LDL Particle Number: 1160 nmol/L — ABNORMAL HIGH (ref <1000)
LDL Size: 19.4 nm — ABNORMAL LOW (ref >20.5)
LDLC SERPL CALC-MCNC: 15 mg/dL (ref <100)
LP-IR Score: 80 — ABNORMAL HIGH (ref <=45)
Small LDL Particle Number: 1134 nmol/L — ABNORMAL HIGH (ref <=527)
Triglycerides by NMR: 353 mg/dL — ABNORMAL HIGH (ref <150)

## 2013-08-21 LAB — HEPATIC FUNCTION PANEL
ALT: 24 IU/L (ref 0–44)
AST: 28 IU/L (ref 0–40)
Albumin: 4.4 g/dL (ref 3.5–4.8)
Alkaline Phosphatase: 47 IU/L (ref 39–117)
Bilirubin, Direct: 0.12 mg/dL (ref 0.00–0.40)
Total Bilirubin: 0.3 mg/dL (ref 0.0–1.2)
Total Protein: 6.8 g/dL (ref 6.0–8.5)

## 2013-08-21 LAB — TESTOSTERONE,FREE AND TOTAL
Testosterone, Free: 4 pg/mL — ABNORMAL LOW (ref 6.6–18.1)
Testosterone: 488 ng/dL (ref 348–1197)

## 2013-08-21 LAB — BMP8+EGFR
BUN/Creatinine Ratio: 13 (ref 10–22)
BUN: 19 mg/dL (ref 8–27)
CO2: 19 mmol/L (ref 18–29)
Calcium: 9.8 mg/dL (ref 8.6–10.2)
Chloride: 100 mmol/L (ref 97–108)
Creatinine, Ser: 1.49 mg/dL — ABNORMAL HIGH (ref 0.76–1.27)
GFR calc Af Amer: 53 mL/min/{1.73_m2} — ABNORMAL LOW (ref >59)
GFR calc non Af Amer: 46 mL/min/{1.73_m2} — ABNORMAL LOW (ref >59)
Glucose: 119 mg/dL — ABNORMAL HIGH (ref 65–99)
Potassium: 4.1 mmol/L (ref 3.5–5.2)
Sodium: 139 mmol/L (ref 134–144)

## 2013-08-21 LAB — VITAMIN D 25 HYDROXY (VIT D DEFICIENCY, FRACTURES): Vit D, 25-Hydroxy: 28.4 ng/mL — ABNORMAL LOW (ref 30.0–100.0)

## 2013-08-26 ENCOUNTER — Encounter: Payer: Self-pay | Admitting: Family Medicine

## 2013-08-26 ENCOUNTER — Ambulatory Visit (INDEPENDENT_AMBULATORY_CARE_PROVIDER_SITE_OTHER): Payer: Medicare Other | Admitting: Family Medicine

## 2013-08-26 VITALS — BP 113/69 | HR 80 | Temp 97.3°F | Ht 67.0 in | Wt 190.0 lb

## 2013-08-26 DIAGNOSIS — E119 Type 2 diabetes mellitus without complications: Secondary | ICD-10-CM

## 2013-08-26 DIAGNOSIS — I10 Essential (primary) hypertension: Secondary | ICD-10-CM

## 2013-08-26 DIAGNOSIS — R3915 Urgency of urination: Secondary | ICD-10-CM

## 2013-08-26 DIAGNOSIS — E785 Hyperlipidemia, unspecified: Secondary | ICD-10-CM

## 2013-08-26 LAB — POCT URINALYSIS DIPSTICK
Bilirubin, UA: NEGATIVE
Blood, UA: NEGATIVE
Glucose, UA: 250
Ketones, UA: NEGATIVE
Leukocytes, UA: NEGATIVE
Nitrite, UA: NEGATIVE
Protein, UA: NEGATIVE
Spec Grav, UA: 1.02
Urobilinogen, UA: NEGATIVE
pH, UA: 5

## 2013-08-26 LAB — POCT UA - MICROSCOPIC ONLY
Crystals, Ur, HPF, POC: NEGATIVE
Epithelial cells, urine per micros: NEGATIVE
Yeast, UA: NEGATIVE

## 2013-08-26 NOTE — Progress Notes (Signed)
  Subjective:    Patient ID: Timothy [name], male    DOB: July 16, 1941, 72 y.o.   MRN: [id number]  HPI Patient returns to clinic today for followup and management of chronic medical problems. These problems include hyperlipidemia, hypertension, BPH, coronary artery disease and obesity. We have for this patient on many occasions and are still having problems getting his lipids under good control despite taking 4 different medications for this. Patient describes increased urinary urgency for the past 3-4 months.   Review of Systems  Constitutional: Negative.   HENT: Negative.   Eyes: Negative.   Respiratory: Negative.   Cardiovascular: Negative.   Gastrointestinal: Negative.   Endocrine: Negative.   Genitourinary: Negative.   Musculoskeletal: Negative.   Skin: Negative.   Allergic/Immunologic: Negative.   Neurological: Negative.   Hematological: Negative.   Psychiatric/Behavioral: Negative.    recent labs were reviewed with patient    Objective:   Physical Exam BP 113/69  Pulse 80  Temp(Src) 97.3 F (36.3 C) (Oral)  Ht 5\' 7"  (1.702 m)  Wt 190 lb (86.183 kg)  BMI 29.75 kg/m2  The patient appeared well nourished and normally developed for is age, alert and oriented to time and place. Speech, behavior and judgement appear normal. Vital signs as documented.  Head exam is unremarkable. No scleral icterus or pallor noted. Ears nose and throat were normal.  Neck is without jugular venous distension, thyromegally, or carotid bruits. Carotid upstrokes are brisk bilaterally. No cervical adenopathy. Lungs are clear anteriorly and posteriorly to auscultation. Normal respiratory effort. Cardiac exam reveals regular rate and rhythm at 72 per minute. First and second heart sounds normal.  No murmurs, rubs or gallops.  Abdominal exam reveals somewhat decreased bowl sounds, no masses, no organomegaly and no aortic enlargement. No inguinal adenopathy. No abdominal tenderness Extremities are  nonedematous and both femoral and pedal pulses are normal. Skin without pallor or jaundice.  Warm and dry, without rash. Neurologic exam reveals normal deep tendon reflexes and normal sensation. Diabetic foot exam was done.          Assessment & Plan:  1. DM - POCT urinalysis dipstick - POCT UA - Microscopic Only  2. Essential hypertension, benign  3. DYSLIPIDEMIA  4. Urgency of urination - POCT urinalysis dipstick - POCT UA - Microscopic Only  Patient Instructions  Restart vitamin D3 2000 daily Avoid Eating bread Get flu shot in October Always be careful and don't put yourself at risk of falling Continue aggressive therapeutic lifestyle changes which include diet and exercise   Arrie Senate MD

## 2013-08-26 NOTE — Patient Instructions (Signed)
Restart vitamin D3 2000 daily Avoid Eating bread Get flu shot in October Always be careful and don't put yourself at risk of falling Continue aggressive therapeutic lifestyle changes which include diet and exercise

## 2013-09-07 ENCOUNTER — Telehealth: Payer: Self-pay | Admitting: Pharmacist

## 2013-09-07 NOTE — Telephone Encounter (Signed)
Reviewed last Lipid Panel - will consult with other clinical pharmacist for recommendations for better lipid control.

## 2013-09-09 ENCOUNTER — Other Ambulatory Visit (INDEPENDENT_AMBULATORY_CARE_PROVIDER_SITE_OTHER): Payer: Medicare Other

## 2013-09-09 ENCOUNTER — Other Ambulatory Visit: Payer: Medicare Other

## 2013-09-09 DIAGNOSIS — Z125 Encounter for screening for malignant neoplasm of prostate: Secondary | ICD-10-CM

## 2013-09-10 LAB — PSA, TOTAL AND FREE
PSA, Free Pct: 80 %
PSA, Free: 0.16 ng/mL
PSA: 0.2 ng/mL (ref 0.0–4.0)

## 2013-09-10 NOTE — Telephone Encounter (Signed)
Reviewed lipid results.   After consultation with Memory Argue decided to discontinue Niaspan / niacin.

## 2013-09-11 LAB — FECAL OCCULT BLOOD, IMMUNOCHEMICAL: Fecal Occult Bld: NEGATIVE

## 2013-09-14 ENCOUNTER — Encounter: Payer: Self-pay | Admitting: *Deleted

## 2013-09-28 ENCOUNTER — Ambulatory Visit (INDEPENDENT_AMBULATORY_CARE_PROVIDER_SITE_OTHER): Payer: Medicare Other

## 2013-09-28 DIAGNOSIS — Z23 Encounter for immunization: Secondary | ICD-10-CM

## 2013-10-11 ENCOUNTER — Other Ambulatory Visit: Payer: Self-pay | Admitting: Family Medicine

## 2013-11-02 ENCOUNTER — Other Ambulatory Visit: Payer: Self-pay | Admitting: Pharmacist

## 2013-11-02 MED ORDER — EZETIMIBE 10 MG PO TABS: 10.0000 mg | ORAL_TABLET | Freq: Every day | ORAL | Status: DC

## 2013-11-02 MED ORDER — ESOMEPRAZOLE MAGNESIUM 40 MG PO CPDR: 40.0000 mg | DELAYED_RELEASE_CAPSULE | Freq: Every day | ORAL | Status: DC

## 2013-11-02 MED ORDER — AMLODIPINE-OLMESARTAN 5-40 MG PO TABS: 1.0000 | ORAL_TABLET | Freq: Every day | ORAL | Status: DC

## 2013-11-02 MED ORDER — FENOFIBRIC ACID 135 MG PO CPDR: 135.0000 mg | DELAYED_RELEASE_CAPSULE | Freq: Every day | ORAL | Status: DC

## 2013-11-02 MED ORDER — SAXAGLIPTIN-METFORMIN ER 2.5-1000 MG PO TB24: 1.0000 | ORAL_TABLET | Freq: Two times a day (BID) | ORAL | Status: DC

## 2013-11-02 MED ORDER — ROSUVASTATIN CALCIUM 20 MG PO TABS: 20.0000 mg | ORAL_TABLET | Freq: Every day | ORAL | Status: DC

## 2013-11-02 MED ORDER — METOPROLOL SUCCINATE ER 50 MG PO TB24: 50.0000 mg | ORAL_TABLET | Freq: Every day | ORAL | Status: DC

## 2013-11-02 MED ORDER — HYDROCHLOROTHIAZIDE 25 MG PO TABS: 25.0000 mg | ORAL_TABLET | Freq: Every day | ORAL | Status: DC

## 2013-11-02 NOTE — Telephone Encounter (Signed)
Patient is changing from mail order pharmacy to [facility] and needs rx's sent to them.  Rx for 90d supply sent with 1 rf.

## 2013-11-03 ENCOUNTER — Other Ambulatory Visit: Payer: Self-pay | Admitting: *Deleted

## 2013-11-03 MED ORDER — SAXAGLIPTIN-METFORMIN ER 2.5-1000 MG PO TB24: 1.0000 | ORAL_TABLET | Freq: Two times a day (BID) | ORAL | Status: DC

## 2013-11-03 MED ORDER — METOPROLOL SUCCINATE ER 50 MG PO TB24: 50.0000 mg | ORAL_TABLET | Freq: Every day | ORAL | Status: DC

## 2013-11-03 MED ORDER — ROSUVASTATIN CALCIUM 20 MG PO TABS: 20.0000 mg | ORAL_TABLET | Freq: Every day | ORAL | Status: DC

## 2013-11-03 MED ORDER — AMLODIPINE-OLMESARTAN 5-40 MG PO TABS: 1.0000 | ORAL_TABLET | Freq: Every day | ORAL | Status: DC

## 2013-11-03 MED ORDER — ESOMEPRAZOLE MAGNESIUM 40 MG PO CPDR: 40.0000 mg | DELAYED_RELEASE_CAPSULE | Freq: Every day | ORAL | Status: DC

## 2013-11-03 MED ORDER — EZETIMIBE 10 MG PO TABS: 10.0000 mg | ORAL_TABLET | Freq: Every day | ORAL | Status: DC

## 2013-11-03 MED ORDER — HYDROCHLOROTHIAZIDE 25 MG PO TABS: 25.0000 mg | ORAL_TABLET | Freq: Every day | ORAL | Status: DC

## 2013-11-03 MED ORDER — FENOFIBRIC ACID 135 MG PO CPDR: 135.0000 mg | DELAYED_RELEASE_CAPSULE | Freq: Every day | ORAL | Status: DC

## 2013-11-03 NOTE — Progress Notes (Signed)
RXs sent to [facility] Should have been sent to [place] changed and sent to [facility]

## 2014-02-03 ENCOUNTER — Ambulatory Visit (INDEPENDENT_AMBULATORY_CARE_PROVIDER_SITE_OTHER): Payer: Medicare Other

## 2014-02-03 ENCOUNTER — Ambulatory Visit (INDEPENDENT_AMBULATORY_CARE_PROVIDER_SITE_OTHER): Payer: Medicare Other | Admitting: Family Medicine

## 2014-02-03 ENCOUNTER — Other Ambulatory Visit: Payer: Self-pay | Admitting: Family Medicine

## 2014-02-03 ENCOUNTER — Encounter: Payer: Self-pay | Admitting: Family Medicine

## 2014-02-03 VITALS — BP 121/61 | HR 84 | Temp 97.2°F | Ht 67.0 in | Wt 186.0 lb

## 2014-02-03 DIAGNOSIS — R059 Cough, unspecified: Secondary | ICD-10-CM

## 2014-02-03 DIAGNOSIS — I10 Essential (primary) hypertension: Secondary | ICD-10-CM

## 2014-02-03 DIAGNOSIS — J209 Acute bronchitis, unspecified: Secondary | ICD-10-CM

## 2014-02-03 DIAGNOSIS — E119 Type 2 diabetes mellitus without complications: Secondary | ICD-10-CM

## 2014-02-03 DIAGNOSIS — R05 Cough: Secondary | ICD-10-CM

## 2014-02-03 DIAGNOSIS — D518 Other vitamin B12 deficiency anemias: Secondary | ICD-10-CM

## 2014-02-03 LAB — POCT CBC
Granulocyte percent: 80.9 %G — AB (ref 37–80)
HCT, POC: 37.7 % — AB (ref 43.5–53.7)
Hemoglobin: 12 g/dL — AB (ref 14.1–18.1)
Lymph, poc: 1.9 (ref 0.6–3.4)
MCH, POC: 28.6 pg (ref 27–31.2)
MCHC: 31.7 g/dL — AB (ref 31.8–35.4)
MCV: 90.2 fL (ref 80–97)
MPV: 8.8 fL (ref 0–99.8)
POC Granulocyte: 9.3 — AB (ref 2–6.9)
POC LYMPH PERCENT: 16.9 %L (ref 10–50)
Platelet Count, POC: 215 10*3/uL (ref 142–424)
RBC: 4.2 M/uL — AB (ref 4.69–6.13)
RDW, POC: 13.8 %
WBC: 11.5 10*3/uL — AB (ref 4.6–10.2)

## 2014-02-03 MED ORDER — AZITHROMYCIN 250 MG PO TABS: ORAL_TABLET | ORAL | Status: DC

## 2014-02-03 NOTE — Progress Notes (Signed)
Subjective:    Patient ID: Timothy [name], male    DOB: Dec 23, 1940, 73 y.o.   MRN: [id number]  HPI Patient here today for cough that has been going on for 2 weeks. The patient has not had any fever. His cough has been unproductive.       Patient Active Problem List   Diagnosis Date Noted  . Overweight 06/27/2011  . ASCVD (arteriosclerotic cardiovascular disease)   . Essential hypertension, benign   . Hyperplasia of prostate   . 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 Colon polyp   . ESSENTIAL HYPERTENSION, BENIGN 06/28/2010  . DM 05/24/2010  . DYSLIPIDEMIA 05/13/2009  . CAD 05/13/2009   Outpatient Encounter Prescriptions as of 02/03/2014  Medication Sig  . amLODipine-olmesartan (AZOR) 5-40 MG per tablet Take 1 tablet by mouth daily.  Marland Kitchen aspirin 81 MG EC tablet Take 81 mg by mouth daily.    . Choline Fenofibrate (FENOFIBRIC ACID) 135 MG CPDR Take 135 mg by mouth daily.  Marland Kitchen esomeprazole (NEXIUM) 40 MG capsule Take 1 capsule (40 mg total) by mouth daily.  Marland Kitchen ezetimibe (ZETIA) 10 MG tablet Take 1 tablet (10 mg total) by mouth daily.  . hydrochlorothiazide (HYDRODIURIL) 25 MG tablet Take 1 tablet (25 mg total) by mouth daily.  . metoprolol succinate (TOPROL-XL) 50 MG 24 hr tablet Take 1 tablet (50 mg total) by mouth daily. Take with or immediately following a meal.  . nitroGLYCERIN (NITROSTAT) 0.4 MG SL tablet Place 0.4 mg under the tongue every 5 (five) minutes as needed.    . rosuvastatin (CRESTOR) 20 MG tablet Take 1 tablet (20 mg total) by mouth daily.  . Saxagliptin-Metformin (KOMBIGLYZE XR) 2.04-999 MG TB24 Take 1 tablet by mouth 2 (two) times daily.    Review of Systems  Constitutional: Negative.  Negative for fever.  HENT: Positive for congestion.   Eyes: Negative.   Respiratory: Positive for cough. Negative for shortness of breath.   Cardiovascular: Negative.   Gastrointestinal: Negative.   Endocrine: Negative.   Genitourinary: Negative.     Musculoskeletal: Negative.   Skin: Negative.   Allergic/Immunologic: Negative.   Neurological: Negative.   Hematological: Negative.   Psychiatric/Behavioral: Negative.        Objective:   Physical Exam  Nursing note and vitals reviewed. Constitutional: He is oriented to person, place, and time. He appears well-developed and well-nourished. No distress.  HENT:  Head: Normocephalic and atraumatic.  Right Ear: External ear normal.  Left Ear: External ear normal.  Mouth/Throat: Oropharynx is clear and moist. No oropharyngeal exudate.  Nasal congestion bilaterally with some dry blood in the left nostril  Eyes: Conjunctivae and EOM are normal. Pupils are equal, round, and reactive to light. Right eye exhibits no discharge. Left eye exhibits no discharge. No scleral icterus.  Neck: Normal range of motion. Neck supple. No thyromegaly present.  Cardiovascular: Normal rate, normal heart sounds and intact distal pulses.  Exam reveals no gallop and no friction rub.   No murmur heard. Slightly irregular at 72 per minute  Pulmonary/Chest: Effort normal and breath sounds normal. No respiratory distress. He has no wheezes. He has no rales. He exhibits no tenderness.  Dry cough  Abdominal: Soft. Bowel sounds are normal. He exhibits no mass. There is tenderness (slight epigastric tenderness). There is no rebound and no guarding.  Musculoskeletal: Normal range of motion. He exhibits no tenderness.  Lymphadenopathy:    He has no cervical adenopathy.  Neurological: He is alert  and oriented to person, place, and time.  Skin: Skin is warm and dry. No rash noted.  Psychiatric: He has a normal mood and affect. His behavior is normal. Judgment and thought content normal.   BP 121/61  Pulse 84  Temp(Src) 97.2 F (36.2 C) (Oral)  Ht 5\' 7"  (1.702 m)  Wt 186 lb (84.369 kg)  BMI 29.12 kg/m2  [facility] reading (PRIMARY) by  Dr.Moore- chest x-ray  -- no active disease                                      Assessment & Plan:   1. Cough - POCT CBC - DG Chest 2 View; Future - azithromycin (ZITHROMAX) 250 MG tablet; Two the first day, then one daily until completed  Dispense: 6 tablet; Refill: 0  2. Essential hypertension, benign  3. 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 DM  5. Acute bronchitis - azithromycin (ZITHROMAX) 250 MG tablet; Two the first day, then one daily until completed  Dispense: 6 tablet; Refill: 0  Patient Instructions  Continue to drink plenty of fluids Use a cool mist humidifier in her bedroom at nighttime Take Mucinex maximum strength, loop and white in color, 1 twice daily for cough and congestion with a large glass of water. You can purchase a saline nose spray, ayr, over-the-counter, use as frequently during the day and a gel version he can use at nighttime in your nose. Take antibiotics as directed You should duoneb once or twice daily for the next week or so   Arrie Senate MD

## 2014-02-03 NOTE — Patient Instructions (Signed)
Continue to drink plenty of fluids Use a cool mist humidifier in her bedroom at nighttime Take Mucinex maximum strength, loop and white in color, 1 twice daily for cough and congestion with a large glass of water. You can purchase a saline nose spray, ayr, over-the-counter, use as frequently during the day and a gel version he can use at nighttime in your nose. Take antibiotics as directed You should duoneb once or twice daily for the next week or so

## 2014-02-10 ENCOUNTER — Other Ambulatory Visit: Payer: Self-pay | Admitting: Family Medicine

## 2014-03-10 ENCOUNTER — Ambulatory Visit: Payer: Medicare Other | Admitting: Family Medicine

## 2014-03-16 ENCOUNTER — Other Ambulatory Visit (INDEPENDENT_AMBULATORY_CARE_PROVIDER_SITE_OTHER): Payer: Medicare Other

## 2014-03-16 DIAGNOSIS — E119 Type 2 diabetes mellitus without complications: Secondary | ICD-10-CM

## 2014-03-16 DIAGNOSIS — E785 Hyperlipidemia, unspecified: Secondary | ICD-10-CM

## 2014-03-16 DIAGNOSIS — I10 Essential (primary) hypertension: Secondary | ICD-10-CM

## 2014-03-16 DIAGNOSIS — E559 Vitamin D deficiency, unspecified: Secondary | ICD-10-CM

## 2014-03-16 LAB — POCT CBC
Granulocyte percent: 71.1 %G (ref 37–80)
HCT, POC: 38 % — AB (ref 43.5–53.7)
Hemoglobin: 12.7 g/dL — AB (ref 14.1–18.1)
Lymph, poc: 2.2 (ref 0.6–3.4)
MCH, POC: 29.9 pg (ref 27–31.2)
MCHC: 33.4 g/dL (ref 31.8–35.4)
MCV: 89.4 fL (ref 80–97)
MPV: 8.7 fL (ref 0–99.8)
POC Granulocyte: 6.1 (ref 2–6.9)
POC LYMPH PERCENT: 25.2 %L (ref 10–50)
Platelet Count, POC: 177 10*3/uL (ref 142–424)
RBC: 4.3 M/uL — AB (ref 4.69–6.13)
RDW, POC: 13.6 %
WBC: 8.6 10*3/uL (ref 4.6–10.2)

## 2014-03-16 LAB — POCT GLYCOSYLATED HEMOGLOBIN (HGB A1C): Hemoglobin A1C: 5.8

## 2014-03-16 NOTE — Progress Notes (Signed)
Pt came in for labs only 

## 2014-03-17 LAB — NMR, LIPOPROFILE
Cholesterol: 119 mg/dL (ref <200)
HDL Cholesterol by NMR: 20 mg/dL — ABNORMAL LOW (ref >=40)
HDL Particle Number: 19.1 umol/L — ABNORMAL LOW (ref >=30.5)
LDL Particle Number: 1296 nmol/L — ABNORMAL HIGH (ref <1000)
LDL Size: 19.5 nm — ABNORMAL LOW (ref >20.5)
LDLC SERPL CALC-MCNC: 28 mg/dL (ref <100)
LP-IR Score: 70 — ABNORMAL HIGH (ref <=45)
Small LDL Particle Number: 1145 nmol/L — ABNORMAL HIGH (ref <=527)
Triglycerides by NMR: 353 mg/dL — ABNORMAL HIGH (ref <150)

## 2014-03-17 LAB — HEPATIC FUNCTION PANEL
ALT: 19 IU/L (ref 0–44)
AST: 25 IU/L (ref 0–40)
Albumin: 3.9 g/dL (ref 3.5–4.8)
Alkaline Phosphatase: 39 IU/L (ref 39–117)
Bilirubin, Direct: 0.12 mg/dL (ref 0.00–0.40)
Total Bilirubin: 0.3 mg/dL (ref 0.0–1.2)
Total Protein: 6.8 g/dL (ref 6.0–8.5)

## 2014-03-17 LAB — BMP8+EGFR
BUN/Creatinine Ratio: 16 (ref 10–22)
BUN: 25 mg/dL (ref 8–27)
CO2: 18 mmol/L (ref 18–29)
Calcium: 9.2 mg/dL (ref 8.6–10.2)
Chloride: 100 mmol/L (ref 97–108)
Creatinine, Ser: 1.59 mg/dL — ABNORMAL HIGH (ref 0.76–1.27)
GFR calc Af Amer: 49 mL/min/{1.73_m2} — ABNORMAL LOW (ref >59)
GFR calc non Af Amer: 43 mL/min/{1.73_m2} — ABNORMAL LOW (ref >59)
Glucose: 121 mg/dL — ABNORMAL HIGH (ref 65–99)
Potassium: 4.3 mmol/L (ref 3.5–5.2)
Sodium: 137 mmol/L (ref 134–144)

## 2014-03-17 LAB — VITAMIN D 25 HYDROXY (VIT D DEFICIENCY, FRACTURES): Vit D, 25-Hydroxy: 25.2 ng/mL — ABNORMAL LOW (ref 30.0–100.0)

## 2014-03-24 ENCOUNTER — Ambulatory Visit (INDEPENDENT_AMBULATORY_CARE_PROVIDER_SITE_OTHER): Payer: Medicare Other

## 2014-03-24 ENCOUNTER — Ambulatory Visit (INDEPENDENT_AMBULATORY_CARE_PROVIDER_SITE_OTHER): Payer: Medicare Other | Admitting: Family Medicine

## 2014-03-24 ENCOUNTER — Encounter: Payer: Self-pay | Admitting: Family Medicine

## 2014-03-24 VITALS — BP 115/67 | HR 74 | Temp 97.4°F | Ht 67.0 in | Wt 189.0 lb

## 2014-03-24 DIAGNOSIS — R799 Abnormal finding of blood chemistry, unspecified: Secondary | ICD-10-CM

## 2014-03-24 DIAGNOSIS — I10 Essential (primary) hypertension: Secondary | ICD-10-CM

## 2014-03-24 DIAGNOSIS — M542 Cervicalgia: Secondary | ICD-10-CM

## 2014-03-24 DIAGNOSIS — I7090 Unspecified atherosclerosis: Secondary | ICD-10-CM

## 2014-03-24 DIAGNOSIS — N400 Enlarged prostate without lower urinary tract symptoms: Secondary | ICD-10-CM

## 2014-03-24 DIAGNOSIS — I2510 Atherosclerotic heart disease of native coronary artery without angina pectoris: Secondary | ICD-10-CM

## 2014-03-24 DIAGNOSIS — E119 Type 2 diabetes mellitus without complications: Secondary | ICD-10-CM

## 2014-03-24 DIAGNOSIS — R7989 Other specified abnormal findings of blood chemistry: Secondary | ICD-10-CM

## 2014-03-24 MED ORDER — PREDNISONE 10 MG PO TABS: ORAL_TABLET | ORAL | Status: DC

## 2014-03-24 MED ORDER — TRAMADOL HCL 50 MG PO TABS: 50.0000 mg | ORAL_TABLET | Freq: Four times a day (QID) | ORAL | Status: DC | PRN

## 2014-03-24 NOTE — Progress Notes (Signed)
Subjective:    Patient ID: Timothy [name], male    DOB: 1941/08/08, 73 y.o.   MRN: [id number]  HPI Pt here for follow up and management of chronic medical problems. The patient does complain of neck pain similar to what he had several years ago before he had surgery by the neurosurgeon. He indicates he had trouble laying down last night. The his pain is restricted to his neck and does not radiate to either arm. Also the patient has had a rising creatinine and we will go on and schedule him for a visit with the nephrologist in the future. He is also due a Prevnar vaccine. We will review his lab work with him which was done recently.        Patient Active Problem List   Diagnosis Date Noted  . Overweight 06/27/2011  . ASCVD (arteriosclerotic cardiovascular disease)   . Essential hypertension, benign   . Hyperplasia of prostate   . 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 Colon polyp   . ESSENTIAL HYPERTENSION, BENIGN 06/28/2010  . DM 05/24/2010  . DYSLIPIDEMIA 05/13/2009  . CAD 05/13/2009   Outpatient Encounter Prescriptions as of 03/24/2014  Medication Sig  . amLODipine-olmesartan (AZOR) 5-40 MG per tablet Take 1 tablet by mouth daily.  Marland Kitchen aspirin 81 MG EC tablet Take 81 mg by mouth daily.    . Choline Fenofibrate (FENOFIBRIC ACID) 135 MG CPDR TAKE (1) CAPSULE DAILY  . esomeprazole (NEXIUM) 40 MG capsule Take 1 capsule (40 mg total) by mouth daily.  Marland Kitchen ezetimibe (ZETIA) 10 MG tablet Take 1 tablet (10 mg total) by mouth daily.  . hydrochlorothiazide (HYDRODIURIL) 25 MG tablet TAKE 1 TABLET DAILY  . KOMBIGLYZE XR 2.04-999 MG TB24 TAKE  (1)  TABLET TWICE A DAY.  . metoprolol succinate (TOPROL-XL) 50 MG 24 hr tablet TAKE 1 TABLET ONCE A DAY WITH A MEAL  . rosuvastatin (CRESTOR) 20 MG tablet Take 1 tablet (20 mg total) by mouth daily.  . nitroGLYCERIN (NITROSTAT) 0.4 MG SL tablet Place 0.4 mg under the tongue every 5 (five) minutes as needed.    . [DISCONTINUED]  azithromycin (ZITHROMAX) 250 MG tablet Two the first day, then one daily until completed    Review of Systems  Constitutional: Negative.   HENT: Negative.   Eyes: Negative.        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   Cardiovascular: Negative.   Gastrointestinal: Negative.   Endocrine: Negative.   Genitourinary: Negative.   Musculoskeletal: Positive for neck pain Arnoldo Morale done surgery several years ago. Pt feels like it is inflammation).  Skin: Negative.   Allergic/Immunologic: Negative.   Neurological: Negative.   Hematological: Negative.   Psychiatric/Behavioral: Negative.        Objective:   Physical Exam  Nursing note and vitals reviewed. Constitutional: He is oriented to person, place, and time. He appears well-developed and well-nourished. He appears distressed.  HENT:  Head: Normocephalic and atraumatic.  Eyes: Conjunctivae and EOM are normal. Pupils are equal, round, and reactive to light. Right eye exhibits no discharge. Left eye exhibits no discharge. No scleral icterus.  Neck: Normal range of motion. Neck supple. No thyromegaly present.  Inability to move or turn head 22 cervical pain. He is currently wearing a cervical collar.  Cardiovascular: Normal rate, regular rhythm, normal heart sounds and intact distal pulses.  Exam reveals no gallop and no friction rub.   No murmur heard. At 72 per min  Pulmonary/Chest: Effort normal and breath sounds normal. No respiratory distress. He has no wheezes. He has no rales. He exhibits no tenderness.  Abdominal: Soft. Bowel sounds are normal. He exhibits no mass. There is no tenderness. There is no rebound and no guarding.  Musculoskeletal: Normal range of motion. He exhibits no edema and no tenderness.  Limited range of motion of cervical spine  Lymphadenopathy:    He has no cervical adenopathy.  Neurological: He is alert and oriented to person, place, and time. He has normal reflexes. No cranial nerve deficit.    Skin: Skin is warm and dry. No rash noted.  Psychiatric: He has a normal mood and affect. His behavior is normal. Judgment and thought content normal.   BP 115/67  Pulse 74  Temp(Src) 97.4 F (36.3 C) (Oral)  Ht 5\' 7"  ([id number] m)  Wt 189 lb (85.73 kg)  BMI 29.59 kg/m2  [facility] reading (PRIMARY) by  Dr. Kathee Polite spine  --- degenerative changes with previous plate inserted on surgery                                      Assessment & Plan:  1. ASCVD (arteriosclerotic cardiovascular disease)  2. CAD  3. Essential hypertension, benign  4. DM - POCT UA - Microalbumin  5. Hyperplasia of prostate  6. Neck pain - DG Cervical Spine Complete; Future  7. Elevated serum creatinine - Ambulatory referral to Nephrology  Meds ordered this encounter  Medications  . traMADol (ULTRAM) 50 MG tablet    Sig: Take 1 tablet (50 mg total) by mouth every 6 (six) hours as needed.    Dispense:  30 tablet    Refill:  0   Prednisone 10 tapered #20 as directed  Arrie Senate MD

## 2014-03-24 NOTE — Patient Instructions (Addendum)
Medicare Annual Wellness Visit  [facility] and the medical providers at [facility] strive to bring you the best medical care.  In doing so we not only want to address your current medical conditions and concerns but also to detect new conditions early and prevent illness, disease and health-related problems.    Medicare offers a yearly Wellness Visit which allows our clinical staff to assess your need for preventative services including immunizations, lifestyle education, counseling to decrease risk of preventable diseases and screening for fall risk and other medical concerns.    This visit is provided free of charge (no copay) for all Medicare recipients. The clinical pharmacists at [facility] have begun to conduct these Wellness Visits which will also include a thorough review of all your medications.    As you primary medical provider recommend that you make an appointment for your Annual Wellness Visit if you have not done so already this year.  You may set up this appointment before you leave today or you may call back [phone] and schedule an appointment.  Please make sure when you call that you mention that you are scheduling your Annual Wellness Visit with the clinical pharmacist so that the appointment may be made for the proper length of time.     Continue current medications. Continue good therapeutic lifestyle changes which include good diet and exercise. Fall precautions discussed with patient. If an FOBT was given today- please return it to our front desk. If you are over 32 years old - you may need Prevnar 58 or the adult Pneumonia vaccine.  We will arrange an appointment for you with the neurosurgeon if you're unable to do that. Please be aware that the prednisone may make your blood sugar go higher Because of the elevated triglycerides, you must do all you can do with your diet to help bring these  down. Continue to monitor your blood sugars at home Because of your rising creatinine, we will arrange a visit for you with the nephrologist Take the prescribed medication as directed We will wait on getting a Prevnar vaccine and we'll do this in the future

## 2014-04-08 ENCOUNTER — Telehealth: Payer: Self-pay

## 2014-04-08 ENCOUNTER — Other Ambulatory Visit: Payer: Self-pay | Admitting: *Deleted

## 2014-04-08 DIAGNOSIS — R7989 Other specified abnormal findings of blood chemistry: Secondary | ICD-10-CM

## 2014-04-08 NOTE — Telephone Encounter (Signed)
The patient will call you hopefully today please do his referral, I think to the urologist but I am not certain

## 2014-04-08 NOTE — Telephone Encounter (Signed)
I need you to do a referral for me

## 2014-04-13 ENCOUNTER — Other Ambulatory Visit: Payer: Self-pay | Admitting: Dermatology

## 2014-05-12 ENCOUNTER — Other Ambulatory Visit: Payer: Self-pay | Admitting: Family Medicine

## 2014-05-17 ENCOUNTER — Telehealth: Payer: Self-pay | Admitting: Family Medicine

## 2014-06-07 ENCOUNTER — Other Ambulatory Visit: Payer: Self-pay | Admitting: Family Medicine

## 2014-06-14 ENCOUNTER — Other Ambulatory Visit: Payer: Self-pay | Admitting: Family Medicine

## 2014-07-16 ENCOUNTER — Other Ambulatory Visit (INDEPENDENT_AMBULATORY_CARE_PROVIDER_SITE_OTHER): Payer: Medicare Other

## 2014-07-16 DIAGNOSIS — E119 Type 2 diabetes mellitus without complications: Secondary | ICD-10-CM

## 2014-07-16 DIAGNOSIS — R5381 Other malaise: Secondary | ICD-10-CM

## 2014-07-16 DIAGNOSIS — E559 Vitamin D deficiency, unspecified: Secondary | ICD-10-CM

## 2014-07-16 DIAGNOSIS — R5383 Other fatigue: Principal | ICD-10-CM

## 2014-07-16 DIAGNOSIS — E785 Hyperlipidemia, unspecified: Secondary | ICD-10-CM

## 2014-07-16 DIAGNOSIS — I10 Essential (primary) hypertension: Secondary | ICD-10-CM

## 2014-07-16 LAB — POCT CBC
Granulocyte percent: 65.6 %G (ref 37–80)
HCT, POC: 39 % — AB (ref 43.5–53.7)
Hemoglobin: 12.8 g/dL — AB (ref 14.1–18.1)
Lymph, poc: 2.1 (ref 0.6–3.4)
MCH, POC: 29.9 pg (ref 27–31.2)
MCHC: 32.9 g/dL (ref 31.8–35.4)
MCV: 90.9 fL (ref 80–97)
MPV: 9.5 fL (ref 0–99.8)
POC Granulocyte: 4.4 (ref 2–6.9)
POC LYMPH PERCENT: 31.4 %L (ref 10–50)
Platelet Count, POC: 150 10*3/uL (ref 142–424)
RBC: 4.3 M/uL — AB (ref 4.69–6.13)
RDW, POC: 13.4 %
WBC: 6.7 10*3/uL (ref 4.6–10.2)

## 2014-07-16 LAB — POCT GLYCOSYLATED HEMOGLOBIN (HGB A1C): Hemoglobin A1C: 6

## 2014-07-17 LAB — NMR, LIPOPROFILE
Cholesterol: 126 mg/dL (ref 100–199)
HDL Cholesterol by NMR: 13 mg/dL — ABNORMAL LOW (ref >39)
HDL Particle Number: 19.1 umol/L — ABNORMAL LOW (ref >=30.5)
LDL Particle Number: 1226 nmol/L — ABNORMAL HIGH (ref <1000)
LDL Size: 19.6 nm (ref >20.5)
LDLC SERPL CALC-MCNC: 48 mg/dL (ref 0–99)
LP-IR Score: 63 — ABNORMAL HIGH (ref <=45)
Small LDL Particle Number: 1046 nmol/L — ABNORMAL HIGH (ref <=527)
Triglycerides by NMR: 326 mg/dL — ABNORMAL HIGH (ref 0–149)

## 2014-07-17 LAB — BMP8+EGFR
BUN/Creatinine Ratio: 14 (ref 10–22)
BUN: 22 mg/dL (ref 8–27)
CO2: 22 mmol/L (ref 18–29)
Calcium: 9.9 mg/dL (ref 8.6–10.2)
Chloride: 98 mmol/L (ref 97–108)
Creatinine, Ser: 1.61 mg/dL — ABNORMAL HIGH (ref 0.76–1.27)
GFR calc Af Amer: 48 mL/min/{1.73_m2} — ABNORMAL LOW (ref >59)
GFR calc non Af Amer: 42 mL/min/{1.73_m2} — ABNORMAL LOW (ref >59)
Glucose: 128 mg/dL — ABNORMAL HIGH (ref 65–99)
Potassium: 4.4 mmol/L (ref 3.5–5.2)
Sodium: 138 mmol/L (ref 134–144)

## 2014-07-17 LAB — THYROID PANEL WITH TSH
Free Thyroxine Index: 2 (ref 1.2–4.9)
T3 Uptake Ratio: 31 % (ref 24–39)
T4, Total: 6.6 ug/dL (ref 4.5–12.0)
TSH: 3.36 u[IU]/mL (ref 0.450–4.500)

## 2014-07-17 LAB — VITAMIN D 25 HYDROXY (VIT D DEFICIENCY, FRACTURES): Vit D, 25-Hydroxy: 20.9 ng/mL — ABNORMAL LOW (ref 30.0–100.0)

## 2014-07-26 ENCOUNTER — Ambulatory Visit (INDEPENDENT_AMBULATORY_CARE_PROVIDER_SITE_OTHER): Payer: Medicare Other | Admitting: Family Medicine

## 2014-07-26 ENCOUNTER — Encounter: Payer: Self-pay | Admitting: Family Medicine

## 2014-07-26 VITALS — BP 137/66 | HR 78 | Temp 97.6°F | Ht 67.0 in | Wt 187.0 lb

## 2014-07-26 DIAGNOSIS — I7090 Unspecified atherosclerosis: Secondary | ICD-10-CM

## 2014-07-26 DIAGNOSIS — M791 Myalgia, unspecified site: Secondary | ICD-10-CM

## 2014-07-26 DIAGNOSIS — I2510 Atherosclerotic heart disease of native coronary artery without angina pectoris: Secondary | ICD-10-CM

## 2014-07-26 DIAGNOSIS — I10 Essential (primary) hypertension: Secondary | ICD-10-CM

## 2014-07-26 DIAGNOSIS — R799 Abnormal finding of blood chemistry, unspecified: Secondary | ICD-10-CM

## 2014-07-26 DIAGNOSIS — R7989 Other specified abnormal findings of blood chemistry: Secondary | ICD-10-CM

## 2014-07-26 DIAGNOSIS — E119 Type 2 diabetes mellitus without complications: Secondary | ICD-10-CM

## 2014-07-26 DIAGNOSIS — E785 Hyperlipidemia, unspecified: Secondary | ICD-10-CM

## 2014-07-26 DIAGNOSIS — IMO0001 Reserved for inherently not codable concepts without codable children: Secondary | ICD-10-CM

## 2014-07-26 MED ORDER — NITROGLYCERIN 0.4 MG SL SUBL: 0.4000 mg | SUBLINGUAL_TABLET | SUBLINGUAL | Status: DC | PRN

## 2014-07-26 MED ORDER — SAXAGLIPTIN HCL 2.5 MG PO TABS: 2.5000 mg | ORAL_TABLET | Freq: Two times a day (BID) | ORAL | Status: DC

## 2014-07-26 NOTE — Addendum Note (Signed)
Addended by: Zannie Cove on: 07/26/2014 03:41 PM   Modules accepted: Orders

## 2014-07-26 NOTE — Patient Instructions (Addendum)
Medicare Annual Wellness Visit  [facility] and the medical providers at [facility] strive to bring you the best medical care.  In doing so we not only want to address your current medical conditions and concerns but also to detect new conditions early and prevent illness, disease and health-related problems.    Medicare offers a yearly Wellness Visit which allows our clinical staff to assess your need for preventative services including immunizations, lifestyle education, counseling to decrease risk of preventable diseases and screening for fall risk and other medical concerns.    This visit is provided free of charge (no copay) for all Medicare recipients. The clinical pharmacists at [facility] have begun to conduct these Wellness Visits which will also include a thorough review of all your medications.    As you primary medical provider recommend that you make an appointment for your Annual Wellness Visit if you have not done so already this year.  You may set up this appointment before you leave today or you may call back [phone] and schedule an appointment.  Please make sure when you call that you mention that you are scheduling your Annual Wellness Visit with the clinical pharmacist so that the appointment may be made for the proper length of time.      Continue current medications. Continue good therapeutic lifestyle changes which include good diet and exercise. Fall precautions discussed with patient. If an FOBT was given today- please return it to our front desk. If you are over 29 years old - you may need Prevnar 22 or the adult Pneumonia vaccine.  Because of the leg aches, please hold Crestor and zetia for 4 weeks Also metformin which is in kombiglyze can cause declining renal function We will call in a prescription for onglyza without the metformin We will also arrange for you to have an appointment to see the  nephrologist We should repeat the BMP in 4-6 weeks if he has not seen the nephrologist

## 2014-07-26 NOTE — Progress Notes (Signed)
Subjective:    Patient ID: Timothy [name], male    DOB: 1941-04-17, 73 y.o.   MRN: [id number]  HPI Pt here for follow up and management of chronic medical problems. The patient is complaining of feeling tired and having achiness in his legs and this has been going on for 2 or 3 months. His home blood pressures have been running in the 120s over the 80s. He was supposed to have had an appointment with a nephrologist because of his rising creatinine and unfortunately this has not been done. We will reschedule this and get this appointment as soon as possible.        Patient Active Problem List   Diagnosis Date Noted  . Overweight 06/27/2011  . ASCVD (arteriosclerotic cardiovascular disease)   . Essential hypertension, benign   . Hyperplasia of prostate   . 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 Colon polyp   . ESSENTIAL HYPERTENSION, BENIGN 06/28/2010  . DM 05/24/2010  . DYSLIPIDEMIA 05/13/2009  . CAD 05/13/2009   Outpatient Encounter Prescriptions as of 07/26/2014  Medication Sig  . aspirin 81 MG EC tablet Take 81 mg by mouth daily.    . AZOR 5-40 MG per tablet TAKE 1 TABLET DAILY  . Choline Fenofibrate (FENOFIBRIC ACID) 135 MG CPDR TAKE (1) CAPSULE DAILY  . ezetimibe (ZETIA) 10 MG tablet Take 1 tablet (10 mg total) by mouth daily.  . hydrochlorothiazide (HYDRODIURIL) 25 MG tablet TAKE 1 TABLET DAILY  . KOMBIGLYZE XR 2.04-999 MG TB24 TAKE  (1)  TABLET TWICE A DAY.  . metoprolol succinate (TOPROL-XL) 50 MG 24 hr tablet TAKE 1 TABLET ONCE A DAY WITH A MEAL  . NEXIUM 40 MG capsule TAKE (1) CAPSULE DAILY  . nitroGLYCERIN (NITROSTAT) 0.4 MG SL tablet Place 0.4 mg under the tongue every 5 (five) minutes as needed.    . rosuvastatin (CRESTOR) 20 MG tablet Take 1 tablet (20 mg total) by mouth daily.  . traMADol (ULTRAM) 50 MG tablet Take 1 tablet (50 mg total) by mouth every 6 (six) hours as needed.  . [DISCONTINUED] KOMBIGLYZE XR 2.04-999 MG TB24 TAKE  (1)   TABLET TWICE A DAY.  . [DISCONTINUED] predniSONE (DELTASONE) 10 MG tablet 1 tablet 4 times a day for 2 days,  1 tablet 3 times a day for 2 days,  1 tablet 2 times a day for 2 days, 1 tablet daily for 2 days    Review of Systems  Constitutional: Positive for fatigue (falls asleep easily).  HENT: Negative.   Eyes: Negative.   Respiratory: Negative.   Cardiovascular: Negative.   Gastrointestinal: Negative.   Endocrine: Negative.   Genitourinary: Negative.   Musculoskeletal: Myalgias: legs achey.  Skin: Negative.   Allergic/Immunologic: Negative.   Neurological: Negative.   Hematological: Negative.   Psychiatric/Behavioral: Negative.        Objective:   Physical Exam  Nursing note and vitals reviewed. Constitutional: He is oriented to person, place, and time. He appears well-developed and well-nourished. No distress.  HENT:  Head: Normocephalic and atraumatic.  Right Ear: External ear normal.  Left Ear: External ear normal.  Nose: Nose normal.  Mouth/Throat: Oropharynx is clear and moist. No oropharyngeal exudate.  Eyes: Conjunctivae and EOM are normal. Pupils are equal, round, and reactive to light. Right eye exhibits no discharge. Left eye exhibits no discharge. No scleral icterus.  Neck: Normal range of motion. Neck supple. No thyromegaly present.  Cardiovascular: Normal rate, regular rhythm, normal heart  sounds and intact distal pulses.  Exam reveals no gallop and no friction rub.   No murmur heard. At 72 per minute  Pulmonary/Chest: Effort normal and breath sounds normal. No respiratory distress. He has no wheezes. He has no rales. He exhibits no tenderness.  Abdominal: Soft. Bowel sounds are normal. He exhibits no mass. There is no tenderness. There is no rebound and no guarding.  Musculoskeletal: Normal range of motion. He exhibits no edema.  Lymphadenopathy:    He has no cervical adenopathy.  Neurological: He is alert and oriented to person, place, and time. He has  normal reflexes. No cranial nerve deficit.  Skin: Skin is warm and dry. No rash noted. No erythema. No pallor.  Psychiatric: He has a normal mood and affect. His behavior is normal. Judgment and thought content normal.    BP 99/63  Pulse 78  Temp(Src) 97.6 F (36.4 C) (Oral)  Ht 5\' 7"  (1.702 m)  Wt 187 lb (84.823 kg)  BMI 29.28 kg/m2       Assessment & Plan:   1. ASCVD (arteriosclerotic cardiovascular disease)  2. CAD  3. DM  4. DYSLIPIDEMIA  5. Essential hypertension, benign  6. Myalgia - CK - Uric acid  7. Elevated serum creatinine -Discontinue metformin  Meds ordered this encounter  Medications  . nitroGLYCERIN (NITROSTAT) 0.4 MG SL tablet    Sig: Place 1 tablet (0.4 mg total) under the tongue every 5 (five) minutes as needed.    Dispense:  25 tablet    Refill:  3   Patient Instructions                       Medicare Annual Wellness Visit  [facility] and the medical providers at [facility] strive to bring you the best medical care.  In doing so we not only want to address your current medical conditions and concerns but also to detect new conditions early and prevent illness, disease and health-related problems.    Medicare offers a yearly Wellness Visit which allows our clinical staff to assess your need for preventative services including immunizations, lifestyle education, counseling to decrease risk of preventable diseases and screening for fall risk and other medical concerns.    This visit is provided free of charge (no copay) for all Medicare recipients. The clinical pharmacists at [facility] have begun to conduct these Wellness Visits which will also include a thorough review of all your medications.    As you primary medical provider recommend that you make an appointment for your Annual Wellness Visit if you have not done so already this year.  You may set up this appointment before you leave today or you  may call back [phone] and schedule an appointment.  Please make sure when you call that you mention that you are scheduling your Annual Wellness Visit with the clinical pharmacist so that the appointment may be made for the proper length of time.      Continue current medications. Continue good therapeutic lifestyle changes which include good diet and exercise. Fall precautions discussed with patient. If an FOBT was given today- please return it to our front desk. If you are over 53 years old - you may need Prevnar 30 or the adult Pneumonia vaccine.  Because of the leg aches, please hold Crestor and zetia for 4 weeks Also metformin which is in kombiglyze can cause declining renal function We will call in a prescription for onglyza  without the metformin We will also arrange for you to have an appointment to see the nephrologist We should repeat the BMP in 4-6 weeks if he has not seen the nephrologist    Arrie Senate MD

## 2014-07-27 ENCOUNTER — Ambulatory Visit: Payer: Medicare Other | Admitting: Family Medicine

## 2014-07-27 LAB — URIC ACID: Uric Acid: 4.5 mg/dL (ref 3.7–8.6)

## 2014-07-27 LAB — CK: Total CK: 129 U/L (ref 24–204)

## 2014-08-07 ENCOUNTER — Telehealth: Payer: Self-pay | Admitting: *Deleted

## 2014-08-07 DIAGNOSIS — R195 Other fecal abnormalities: Secondary | ICD-10-CM

## 2014-08-07 NOTE — Telephone Encounter (Signed)
Pt called with his FOBT results - which were positive. He will come by on Monday and CBC done and he will pick up another FOBT card to do. Card is up front and order placed for CBC.

## 2014-08-09 ENCOUNTER — Other Ambulatory Visit: Payer: Self-pay | Admitting: Family Medicine

## 2014-08-09 ENCOUNTER — Other Ambulatory Visit (INDEPENDENT_AMBULATORY_CARE_PROVIDER_SITE_OTHER): Payer: Medicare Other

## 2014-08-09 DIAGNOSIS — R195 Other fecal abnormalities: Secondary | ICD-10-CM

## 2014-08-09 LAB — POCT CBC
Granulocyte percent: 64.5 %G (ref 37–80)
HCT, POC: 40.3 % — AB (ref 43.5–53.7)
Hemoglobin: 12.6 g/dL — AB (ref 14.1–18.1)
Lymph, poc: 1.9 (ref 0.6–3.4)
MCH, POC: 27.8 pg (ref 27–31.2)
MCHC: 31.2 g/dL — AB (ref 31.8–35.4)
MCV: 89.1 fL (ref 80–97)
MPV: 8.9 fL (ref 0–99.8)
POC Granulocyte: 3.8 (ref 2–6.9)
POC LYMPH PERCENT: 31.6 %L (ref 10–50)
Platelet Count, POC: 186 10*3/uL (ref 142–424)
RBC: 4.5 M/uL — AB (ref 4.69–6.13)
RDW, POC: 13.7 %
WBC: 5.9 10*3/uL (ref 4.6–10.2)

## 2014-08-10 ENCOUNTER — Telehealth: Payer: Self-pay | Admitting: *Deleted

## 2014-08-10 ENCOUNTER — Other Ambulatory Visit: Payer: Medicare Other

## 2014-08-10 DIAGNOSIS — Z1212 Encounter for screening for malignant neoplasm of rectum: Secondary | ICD-10-CM

## 2014-08-10 NOTE — Progress Notes (Signed)
Pt dropped off fobt only

## 2014-08-10 NOTE — Telephone Encounter (Signed)
Message copied by Priscille Heidelberg on Tue Aug 10, 2014 11:33 AM ------      Message from: Chipper Herb      Created: Mon Aug 09, 2014 10:35 AM       The CBC has a normal white blood cell count. The hemoglobin is 12.63 weeks ago was 12.8 and this is consistent with readings over the past 6 months. The platelet count is adequate. ------

## 2014-08-11 ENCOUNTER — Other Ambulatory Visit: Payer: Self-pay | Admitting: *Deleted

## 2014-08-11 MED ORDER — SAXAGLIPTIN HCL 2.5 MG PO TABS: 2.5000 mg | ORAL_TABLET | Freq: Two times a day (BID) | ORAL | Status: DC

## 2014-08-12 LAB — SPECIMEN STATUS REPORT

## 2014-08-12 LAB — FECAL OCCULT BLOOD, IMMUNOCHEMICAL: Fecal Occult Bld: NEGATIVE

## 2014-08-13 MED ORDER — SAXAGLIPTIN HCL 2.5 MG PO TABS: 2.5000 mg | ORAL_TABLET | Freq: Every day | ORAL | Status: DC

## 2014-08-13 NOTE — Telephone Encounter (Signed)
RX for Onglyxa was sent in for BID Should have been QD RX corrected per [facility]

## 2014-08-13 NOTE — Addendum Note (Signed)
Addended by: Marin Olp on: 08/13/2014 08:50 AM   Modules accepted: Orders

## 2014-08-19 ENCOUNTER — Other Ambulatory Visit: Payer: Self-pay | Admitting: Nephrology

## 2014-08-19 DIAGNOSIS — N183 Chronic kidney disease, stage 3 unspecified: Secondary | ICD-10-CM

## 2014-08-25 ENCOUNTER — Ambulatory Visit
Admission: RE | Admit: 2014-08-25 | Discharge: 2014-08-25 | Disposition: A | Discharge status: Home / Self Care | Payer: Medicare Other | Source: Ambulatory Visit | Attending: Nephrology | Admitting: Nephrology

## 2014-08-25 DIAGNOSIS — N183 Chronic kidney disease, stage 3 unspecified: Secondary | ICD-10-CM

## 2014-09-13 ENCOUNTER — Other Ambulatory Visit: Payer: Self-pay | Admitting: Family Medicine

## 2014-10-11 ENCOUNTER — Other Ambulatory Visit: Payer: Self-pay | Admitting: *Deleted

## 2014-10-11 ENCOUNTER — Ambulatory Visit (INDEPENDENT_AMBULATORY_CARE_PROVIDER_SITE_OTHER): Payer: Medicare Other

## 2014-10-11 DIAGNOSIS — Z23 Encounter for immunization: Secondary | ICD-10-CM

## 2014-11-08 ENCOUNTER — Other Ambulatory Visit: Payer: Self-pay | Admitting: Family Medicine

## 2014-11-26 ENCOUNTER — Ambulatory Visit (INDEPENDENT_AMBULATORY_CARE_PROVIDER_SITE_OTHER): Payer: Medicare Other

## 2014-11-26 ENCOUNTER — Encounter: Payer: Self-pay | Admitting: Family Medicine

## 2014-11-26 ENCOUNTER — Ambulatory Visit (INDEPENDENT_AMBULATORY_CARE_PROVIDER_SITE_OTHER): Payer: Medicare Other | Admitting: Family Medicine

## 2014-11-26 VITALS — BP 113/70 | HR 77 | Temp 97.9°F | Ht 67.0 in | Wt 191.0 lb

## 2014-11-26 DIAGNOSIS — M25552 Pain in left hip: Secondary | ICD-10-CM

## 2014-11-26 DIAGNOSIS — M25551 Pain in right hip: Secondary | ICD-10-CM

## 2014-11-26 DIAGNOSIS — E118 Type 2 diabetes mellitus with unspecified complications: Secondary | ICD-10-CM

## 2014-11-26 DIAGNOSIS — R32 Unspecified urinary incontinence: Secondary | ICD-10-CM

## 2014-11-26 DIAGNOSIS — M1611 Unilateral primary osteoarthritis, right hip: Secondary | ICD-10-CM

## 2014-11-26 DIAGNOSIS — E559 Vitamin D deficiency, unspecified: Secondary | ICD-10-CM

## 2014-11-26 DIAGNOSIS — N400 Enlarged prostate without lower urinary tract symptoms: Secondary | ICD-10-CM

## 2014-11-26 DIAGNOSIS — E782 Mixed hyperlipidemia: Secondary | ICD-10-CM

## 2014-11-26 DIAGNOSIS — Z0000 Encounter for general adult medical examination without abnormal findings: Secondary | ICD-10-CM

## 2014-11-26 DIAGNOSIS — I2510 Atherosclerotic heart disease of native coronary artery without angina pectoris: Secondary | ICD-10-CM

## 2014-11-26 DIAGNOSIS — M1612 Unilateral primary osteoarthritis, left hip: Secondary | ICD-10-CM

## 2014-11-26 DIAGNOSIS — I10 Essential (primary) hypertension: Secondary | ICD-10-CM

## 2014-11-26 LAB — POCT URINALYSIS DIPSTICK
Bilirubin, UA: NEGATIVE
Blood, UA: NEGATIVE
Glucose, UA: 250
Ketones, UA: NEGATIVE
Leukocytes, UA: NEGATIVE
Nitrite, UA: NEGATIVE
Protein, UA: NEGATIVE
Spec Grav, UA: 1.015
Urobilinogen, UA: NEGATIVE
pH, UA: 5

## 2014-11-26 LAB — POCT UA - MICROSCOPIC ONLY
Bacteria, U Microscopic: NEGATIVE
Casts, Ur, LPF, POC: NEGATIVE
Crystals, Ur, HPF, POC: NEGATIVE
Mucus, UA: NEGATIVE
RBC, urine, microscopic: NEGATIVE
WBC, Ur, HPF, POC: NEGATIVE
Yeast, UA: NEGATIVE

## 2014-11-26 NOTE — Patient Instructions (Addendum)
Medicare Annual Wellness Visit  [facility] and the medical providers at [facility] strive to bring you the best medical care.  In doing so we not only want to address your current medical conditions and concerns but also to detect new conditions early and prevent illness, disease and health-related problems.    Medicare offers a yearly Wellness Visit which allows our clinical staff to assess your need for preventative services including immunizations, lifestyle education, counseling to decrease risk of preventable diseases and screening for fall risk and other medical concerns.    This visit is provided free of charge (no copay) for all Medicare recipients. The clinical pharmacists at [facility] have begun to conduct these Wellness Visits which will also include a thorough review of all your medications.    As you primary medical provider recommend that you make an appointment for your Annual Wellness Visit if you have not done so already this year.  You may set up this appointment before you leave today or you may call back [phone] and schedule an appointment.  Please make sure when you call that you mention that you are scheduling your Annual Wellness Visit with the clinical pharmacist so that the appointment may be made for the proper length of time.     Continue current medications. Continue good therapeutic lifestyle changes which include good diet and exercise. Fall precautions discussed with patient. If an FOBT was given today- please return it to our front desk. If you are over 69 years old - you may need Prevnar 51 or the adult Pneumonia vaccine.  Flu Shots will be available at our office starting mid- September. Please call and schedule a FLU CLINIC APPOINTMENT.   We will discuss further blood sugar management and cholesterol management once lab work is returned. Continue to drink plenty of fluids Continue good  blood sugar control Continue exercise Take Tylenol as needed for pain Be careful not to fall Continue to check feet on a regular basis and monitor blood pressure when possible Read the information about the new injectable cholesterol medication

## 2014-11-26 NOTE — Progress Notes (Signed)
Subjective:    Patient ID: Timothy [name], male    DOB: [birth year], 73 y.o.   MRN: [id number]  HPI Pt here for follow up and management of chronic medical problems. The patient is requesting his annual physical exam today. He does complain of bilateral hip pain which seems to be worse after sitting for a period of time or getting up in the morning. This gets better with movement. He is also describing some urinary incontinence that is slight feeling that when he has to go there is extreme urgency to go and if he doesn't goad void there may be some dampening of urine on his underwear. It is also important to note that since he stopped taking metformin and his statin drug that the muscle aches and pains in his legs have gone.         Patient Active Problem List   Diagnosis Date Noted  . Overweight 06/27/2011  . ASCVD (arteriosclerotic cardiovascular disease)   . Essential hypertension, benign   . Hyperplasia of prostate   . 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 Colon polyp   . ESSENTIAL HYPERTENSION, BENIGN 06/28/2010  . DM 05/24/2010  . DYSLIPIDEMIA 05/13/2009  . CAD 05/13/2009   Outpatient Encounter Prescriptions as of 11/26/2014  Medication Sig  . aspirin 81 MG EC tablet Take 81 mg by mouth daily.    . AZOR 5-40 MG per tablet TAKE 1 TABLET DAILY  . Choline Fenofibrate (FENOFIBRIC ACID) 135 MG CPDR TAKE (1) CAPSULE DAILY  . hydrochlorothiazide (HYDRODIURIL) 25 MG tablet TAKE 1 TABLET DAILY  . metoprolol succinate (TOPROL-XL) 50 MG 24 hr tablet TAKE 1 TABLET ONCE A DAY WITH A MEAL  . NEXIUM 40 MG capsule TAKE (1) CAPSULE DAILY  . ONGLYZA 2.5 MG TABS tablet Take 1 tablet (2.5 mg total) by mouth daily.  . nitroGLYCERIN (NITROSTAT) 0.4 MG SL tablet Place 1 tablet (0.4 mg total) under the tongue every 5 (five) minutes as needed. (Patient not taking: Reported on 11/26/2014)  . traMADol (ULTRAM) 50 MG tablet Take 1 tablet (50 mg total) by mouth every 6 (six)  hours as needed. (Patient not taking: Reported on 11/26/2014)  . [DISCONTINUED] ezetimibe (ZETIA) 10 MG tablet Take 1 tablet (10 mg total) by mouth daily.  . [DISCONTINUED] rosuvastatin (CRESTOR) 20 MG tablet Take 1 tablet (20 mg total) by mouth daily.    Review of Systems  Constitutional: Negative.   HENT: Negative.   Eyes: Negative.   Respiratory: Negative.   Cardiovascular: Negative.   Gastrointestinal: Negative.   Endocrine: Negative.   Genitourinary: Negative.        Some urinary incontinence.   Musculoskeletal: Positive for arthralgias (bilateral hip pain at times).  Skin: Negative.   Allergic/Immunologic: Negative.   Neurological: Negative.   Hematological: Negative.   Psychiatric/Behavioral: Negative.        Objective:   Physical Exam  Constitutional: He is oriented to person, place, and time. He appears well-developed and well-nourished.  HENT:  Head: Normocephalic and atraumatic.  Right Ear: External ear normal.  Left Ear: External ear normal.  Nose: Nose normal.  Mouth/Throat: Oropharynx is clear and moist. No oropharyngeal exudate.  Eyes: Conjunctivae and EOM are normal. Pupils are equal, round, and reactive to light. Right eye exhibits no discharge. Left eye exhibits no discharge. No scleral icterus.  Neck: Normal range of motion. Neck supple. No thyromegaly present.  No carotid bruits or anterior cervical adenopathy  Cardiovascular: Normal rate, regular  rhythm, normal heart sounds and intact distal pulses.  Exam reveals no gallop and no friction rub.   No murmur heard. The heart has a regular rate and rhythm at 72/m  Pulmonary/Chest: Effort normal and breath sounds normal. No respiratory distress. He has no wheezes. He has no rales. He exhibits no tenderness.  Lungs are clear anteriorly and posteriorly and there is no axillary adenopathy  Abdominal: Soft. Bowel sounds are normal. He exhibits no mass. There is no tenderness. There is no rebound and no guarding.    Mild obesity without inguinal adenopathy and no abdominal masses  Genitourinary: Rectum normal, prostate normal and penis normal.  The prostate is slightly enlarged without lumps or masses. The rectal exam was negative without masses there were no inguinal nodes or inguinal hernias palpated  Musculoskeletal: Normal range of motion. He exhibits no edema or tenderness.  Lymphadenopathy:    He has no cervical adenopathy.  Neurological: He is alert and oriented to person, place, and time. He has normal reflexes. No cranial nerve deficit.  Skin: Skin is warm and dry. No rash noted. No erythema. No pallor.  Psychiatric: He has a normal mood and affect. His behavior is normal. Judgment and thought content normal.  Nursing note and vitals reviewed.  BP 113/70 mmHg  Pulse 77  Temp(Src) 97.9 F (36.6 C) (Oral)  Ht '5\' 7"'  (1.702 m)  Wt 191 lb (86.637 kg)  BMI 29.91 kg/m2  [facility] reading (PRIMARY) by  Dr. Charlesetta Garibaldi hips--degenerative changes                                        Assessment & Plan:   1. ASCVD (arteriosclerotic cardiovascular disease) - POCT CBC; Future - POCT CBC  2. Essential hypertension, benign - POCT CBC; Future - BMP8+EGFR; Future - Hepatic function panel; Future - POCT CBC - BMP8+EGFR - Hepatic function panel  3. Hyperplasia of prostate - POCT CBC; Future - POCT UA - Microscopic Only - POCT urinalysis dipstick - PSA, total and free; Future - Urine culture - POCT CBC - PSA, total and free  4. Hyperlipidemia, mixed - POCT CBC; Future - NMR, lipoprofile; Future - POCT CBC - NMR, lipoprofile  5. Type 2 diabetes mellitus with complication - POCT CBC; Future - POCT glycosylated hemoglobin (Hb A1C); Future - BMP8+EGFR; Future - POCT CBC - POCT glycosylated hemoglobin (Hb A1C) - BMP8+EGFR  6. Urinary incontinence, unspecified incontinence type - POCT UA - Microscopic Only - POCT urinalysis dipstick - PSA, total and free; Future - Urine  culture - PSA, total and free  7. Vitamin D deficiency - Vit D  25 hydroxy (rtn osteoporosis monitoring); Future - Vit D  25 hydroxy (rtn osteoporosis monitoring)  8. Bilateral hip pain - DG Hip Bilateral W/Pelvis; Future  9. BPH (benign prostatic hyperplasia)   10. Primary osteoarthritis of left hip  11. Primary osteoarthritis of right hip   Patient Instructions                       Medicare Annual Wellness Visit   and the medical providers at [facility] strive to bring you the best medical care.  In doing so we not only want to address your current medical conditions and concerns but also to detect new conditions early and prevent illness, disease and health-related problems.    Medicare offers a  yearly Wellness Visit which allows our clinical staff to assess your need for preventative services including immunizations, lifestyle education, counseling to decrease risk of preventable diseases and screening for fall risk and other medical concerns.    This visit is provided free of charge (no copay) for all Medicare recipients. The clinical pharmacists at [facility] have begun to conduct these Wellness Visits which will also include a thorough review of all your medications.    As you primary medical provider recommend that you make an appointment for your Annual Wellness Visit if you have not done so already this year.  You may set up this appointment before you leave today or you may call back [phone]) and schedule an appointment.  Please make sure when you call that you mention that you are scheduling your Annual Wellness Visit with the clinical pharmacist so that the appointment may be made for the proper length of time.     Continue current medications. Continue good therapeutic lifestyle changes which include good diet and exercise. Fall precautions discussed with patient. If an FOBT was given today- please return it  to our front desk. If you are over 74 years old - you may need Prevnar 70 or the adult Pneumonia vaccine.  Flu Shots will be available at our office starting mid- September. Please call and schedule a FLU CLINIC APPOINTMENT.   We will discuss further blood sugar management and cholesterol management once lab work is returned. Continue to drink plenty of fluids Continue good blood sugar control Continue exercise Take Tylenol as needed for pain Be careful not to fall Continue to check feet on a regular basis and monitor blood pressure when possible Read the information about the new injectable cholesterol medication    Arrie Senate MD

## 2014-11-28 LAB — URINE CULTURE: Organism ID, Bacteria: NO GROWTH

## 2014-11-29 ENCOUNTER — Telehealth: Payer: Self-pay | Admitting: Family Medicine

## 2014-11-29 NOTE — Telephone Encounter (Signed)
Patient aware of urine results and coming in the am for the rest of labs.

## 2014-11-30 ENCOUNTER — Other Ambulatory Visit (INDEPENDENT_AMBULATORY_CARE_PROVIDER_SITE_OTHER): Payer: Medicare Other

## 2014-11-30 DIAGNOSIS — R748 Abnormal levels of other serum enzymes: Secondary | ICD-10-CM

## 2014-11-30 DIAGNOSIS — E119 Type 2 diabetes mellitus without complications: Secondary | ICD-10-CM

## 2014-11-30 LAB — POCT CBC
Granulocyte percent: 64.7 %G (ref 37–80)
HCT, POC: 41.2 % — AB (ref 43.5–53.7)
Hemoglobin: 13.7 g/dL — AB (ref 14.1–18.1)
Lymph, poc: 1.7 (ref 0.6–3.4)
MCH, POC: 29 pg (ref 27–31.2)
MCHC: 33.2 g/dL (ref 31.8–35.4)
MCV: 87.4 fL (ref 80–97)
MPV: 8.6 fL (ref 0–99.8)
POC Granulocyte: 4 (ref 2–6.9)
POC LYMPH PERCENT: 27.9 %L (ref 10–50)
Platelet Count, POC: 159 10*3/uL (ref 142–424)
RBC: 4.7 M/uL (ref 4.69–6.13)
RDW, POC: 14.4 %
WBC: 6.2 10*3/uL (ref 4.6–10.2)

## 2014-11-30 LAB — POCT GLYCOSYLATED HEMOGLOBIN (HGB A1C): Hemoglobin A1C: 7.5

## 2014-11-30 NOTE — Progress Notes (Signed)
Lab only from dec. [place]

## 2014-12-01 ENCOUNTER — Telehealth: Payer: Self-pay | Admitting: *Deleted

## 2014-12-01 LAB — NMR, LIPOPROFILE
Cholesterol: 183 mg/dL (ref 100–199)
HDL Cholesterol by NMR: 17 mg/dL — ABNORMAL LOW (ref >39)
HDL Particle Number: 19.5 umol/L — ABNORMAL LOW (ref >=30.5)
LDL Particle Number: 860 nmol/L (ref <1000)
LDL Size: 19.8 nm (ref >20.5)
LP-IR Score: 67 — ABNORMAL HIGH (ref <=45)
Small LDL Particle Number: 599 nmol/L — ABNORMAL HIGH (ref <=527)
Triglycerides by NMR: 751 mg/dL (ref 0–149)

## 2014-12-01 LAB — BMP8+EGFR
BUN/Creatinine Ratio: 13 (ref 10–22)
BUN: 21 mg/dL (ref 8–27)
CO2: 23 mmol/L (ref 18–29)
Calcium: 10.1 mg/dL (ref 8.6–10.2)
Chloride: 97 mmol/L (ref 97–108)
Creatinine, Ser: 1.61 mg/dL — ABNORMAL HIGH (ref 0.76–1.27)
GFR calc Af Amer: 48 mL/min/{1.73_m2} — ABNORMAL LOW (ref >59)
GFR calc non Af Amer: 42 mL/min/{1.73_m2} — ABNORMAL LOW (ref >59)
Glucose: 213 mg/dL — ABNORMAL HIGH (ref 65–99)
Potassium: 4.5 mmol/L (ref 3.5–5.2)
Sodium: 137 mmol/L (ref 134–144)

## 2014-12-01 LAB — HEPATIC FUNCTION PANEL
ALT: 44 IU/L (ref 0–44)
AST: 36 IU/L (ref 0–40)
Albumin: 4.4 g/dL (ref 3.5–4.8)
Alkaline Phosphatase: 52 IU/L (ref 39–117)
Bilirubin, Direct: 0.15 mg/dL (ref 0.00–0.40)
Total Bilirubin: 0.6 mg/dL (ref 0.0–1.2)
Total Protein: 7.5 g/dL (ref 6.0–8.5)

## 2014-12-01 LAB — VITAMIN D 25 HYDROXY (VIT D DEFICIENCY, FRACTURES): Vit D, 25-Hydroxy: 16.8 ng/mL — ABNORMAL LOW (ref 30.0–100.0)

## 2014-12-01 LAB — PSA, TOTAL AND FREE
PSA, Free Pct: 66.7 %
PSA, Free: 0.2 ng/mL
PSA: 0.3 ng/mL (ref 0.0–4.0)

## 2014-12-01 NOTE — Telephone Encounter (Signed)
Aware of results. appt scheduled on Jan. 11, 2016 with clinical pharmacist.

## 2014-12-04 ENCOUNTER — Other Ambulatory Visit: Payer: Self-pay | Admitting: Family Medicine

## 2014-12-27 ENCOUNTER — Ambulatory Visit (INDEPENDENT_AMBULATORY_CARE_PROVIDER_SITE_OTHER): Payer: Medicare Other | Admitting: Pharmacist

## 2014-12-27 ENCOUNTER — Encounter: Payer: Self-pay | Admitting: Pharmacist

## 2014-12-27 VITALS — BP 120/70 | Ht 67.0 in | Wt 189.5 lb

## 2014-12-27 DIAGNOSIS — E119 Type 2 diabetes mellitus without complications: Secondary | ICD-10-CM

## 2014-12-27 DIAGNOSIS — H409 Unspecified glaucoma: Secondary | ICD-10-CM | POA: Insufficient documentation

## 2014-12-27 DIAGNOSIS — E785 Hyperlipidemia, unspecified: Secondary | ICD-10-CM

## 2014-12-27 MED ORDER — PANTOPRAZOLE SODIUM 40 MG PO TBEC: 40.0000 mg | DELAYED_RELEASE_TABLET | Freq: Every day | ORAL | Status: DC

## 2014-12-27 MED ORDER — GLIMEPIRIDE 4 MG PO TABS: 4.0000 mg | ORAL_TABLET | Freq: Every day | ORAL | Status: DC

## 2014-12-27 MED ORDER — ROSUVASTATIN CALCIUM 10 MG PO TABS: 10.0000 mg | ORAL_TABLET | Freq: Every day | ORAL | Status: DC

## 2014-12-27 NOTE — Patient Instructions (Signed)
Diabetes and Standards of Medical Care   Diabetes is complicated. You may find that your diabetes team includes a dietitian, nurse, diabetes educator, eye doctor, and more. To help everyone know what is going on and to help you get the care you deserve, the following schedule of care was developed to help keep you on track. Below are the tests, exams, vaccines, medicines, education, and plans you will need.  Blood Glucose Goals Prior to meals = 80 - 130 Within 2 hours of the start of a meal = less than 180  HbA1c test (goal is less than 7.0% - your last value was 7.5%) This test shows how well you have controlled your glucose over the past 2 to 3 months. It is used to see if your diabetes management plan needs to be adjusted.   It is performed at least 2 times a year if you are meeting treatment goals.  It is performed 4 times a year if therapy has changed or if you are not meeting treatment goals.  Blood pressure test  This test is performed at every routine medical visit. The goal is less than 140/90 mmHg for most people, but 130/80 mmHg in some cases. Ask your health care provider about your goal.  Dental exam  Follow up with the dentist regularly.  Eye exam  If you are diagnosed with type 1 diabetes as a child, get an exam upon reaching the age of 28 years or older and have had diabetes for 3 to 5 years. Yearly eye exams are recommended after that initial eye exam.  If you are diagnosed with type 1 diabetes as an adult, get an exam within 5 years of diagnosis and then yearly.  If you are diagnosed with type 2 diabetes, get an exam as soon as possible after the diagnosis and then yearly.  Foot care exam  Visual foot exams are performed at every routine medical visit. The exams check for cuts, injuries, or other problems with the feet.  A comprehensive foot exam should be done yearly. This includes visual inspection as well as assessing foot pulses and testing for loss of  sensation.  Check your feet nightly for cuts, injuries, or other problems with your feet. Tell your health care provider if anything is not healing.  Kidney function test (urine microalbumin)  This test is performed once a year.  Type 1 diabetes: The first test is performed 5 years after diagnosis.  Type 2 diabetes: The first test is performed at the time of diagnosis.  A serum creatinine and estimated glomerular filtration rate (eGFR) test is done once a year to assess the level of chronic kidney disease (CKD), if present.  Lipid profile (cholesterol, HDL, LDL, triglycerides)  Performed every 5 years for most people.  The goal for LDL is less than 100 mg/dL. If you are at high risk, the goal is less than 70 mg/dL.  The goal for HDL is 40 mg/dL to 50 mg/dL for men and 50 mg/dL to 60 mg/dL for women. An HDL cholesterol of 60 mg/dL or higher gives some protection against heart disease.  The goal for triglycerides is less than 150 mg/dL.  Influenza vaccine, pneumococcal vaccine, and hepatitis B vaccine  The influenza vaccine is recommended yearly.  The pneumococcal vaccine is generally given once in a lifetime. However, there are some instances when another vaccination is recommended. Check with your health care provider.  The hepatitis B vaccine is also recommended for adults with diabetes.  Diabetes self-management education  Education is recommended at diagnosis and ongoing as needed.  Treatment plan  Your treatment plan is reviewed at every medical visit.  Document Released: 09/30/2009 Document Revised: 08/05/2013 Document Reviewed: 05/05/2013 Exit[employer] Patient Information 2014 [employer].   

## 2014-12-27 NOTE — Progress Notes (Signed)
Lipid Clinic Consultation  Chief Complaint:   Chief Complaint  Patient presents with  . Hyperlipidemia  . Diabetes      Filed Vitals:   12/27/14 0841  BP: 120/70   HPI:     Patient with hyperlipidemia and type 2 DM - bothe uncontrolled.  Over the last 3 months he has had several medications changes - fish oil, niaspan stopped early 2015.  Crestor stopped 05/2014 due to myalgias.  Kombiglyze changed to onglyza due to elevated Serum creatinine.  Patient was referred to Dr Florene Glen - nephrologist.  I did not have copy of his notes and will request today.  Secondary cause of hyperlipidemia present:  Type 2 diabetes Low fat diet followed?  Yes - sometimes does not follow plan Low carb diet followed?  Yes - but sometimes is not compliant Exercise?  Yes - walking   Most recent NMR [employer] lipomed panel and 11/30/2014 showed : LDL-P 860,   Tg = 751, HDL = 17 and total chol = 183  last A1C was 7.5% most recently     Component Value Date/Time   CHOL 183 11/30/2014 0803    Assessment: hyperlipidemia with history of CAD Type 2 DM with worsening A1c since medication change Elevated serum creatinine - stable Medication Management - patient request alternative to Nexium due to increase in copay starting in 2016.   Recommendations: 1.  Restart Crestor at 10mg  1 tablet daily.  Continue fenofibrate 135mg  daily.  Recheck lipids in 1-2 months 2.  Discontinue Onglyza.  Start glimepriride 4mg  1 tablet daily.  3. Requested copy of office notes from Dr Florene Glen.  Will review and then determine if possibly safe to retry metformin at low dose.  4.  Discontinue Nexium.  Start pantoprazole 40mg  1 tablet daily (patients pharmacy will also check on see if generic nexium will be available in Feb). 5.  RTC in 1 month to recheck BG and assess myalgias.  Cherre Robins, PharmD, CPP, CDE

## 2015-01-24 ENCOUNTER — Telehealth: Payer: Self-pay | Admitting: Family Medicine

## 2015-01-24 NOTE — Telephone Encounter (Signed)
error 

## 2015-02-03 ENCOUNTER — Ambulatory Visit (INDEPENDENT_AMBULATORY_CARE_PROVIDER_SITE_OTHER): Payer: Medicare Other | Admitting: Pharmacist

## 2015-02-03 ENCOUNTER — Encounter: Payer: Self-pay | Admitting: Pharmacist

## 2015-02-03 VITALS — BP 150/80 | HR 77 | Ht 67.0 in | Wt 190.5 lb

## 2015-02-03 DIAGNOSIS — E785 Hyperlipidemia, unspecified: Secondary | ICD-10-CM

## 2015-02-03 DIAGNOSIS — E119 Type 2 diabetes mellitus without complications: Secondary | ICD-10-CM

## 2015-02-03 DIAGNOSIS — I2510 Atherosclerotic heart disease of native coronary artery without angina pectoris: Secondary | ICD-10-CM

## 2015-02-03 NOTE — Progress Notes (Signed)
[facility] Clinic Consultation  Chief Complaint:   Chief Complaint  Patient presents with  . Diabetes  . Hyperlipidemia     Filed Vitals:   02/03/15 0936  BP: 150/80  Pulse: 77   HPI:     Patient with hyperlipidemia and type 2 DM - both uncontrolled.  Patient was last seen about 1 month ago and Crestor 6m daily was started.  He is tolerating well without myalgias.   He was also started on glimepiride 415mdaily doe BG at last visit.  Patient denies hypoglycemia and is tolerating well.  However it has not lowered HBG readings as well as Kombiglyze which was changed due to elevated serum creatinine.  HBG reading in am are 145-155. Also at our last visit pantoprazole was prescribed for patient to take in place of Nexium which is no longer on his insurance's formulary.  He has not started pantoprazole yet because he still had Nexium - he is taking Nexium every other day and reports adequate GERD symptom relief.  Secondary cause of hyperlipidemia present:  Type 2 diabetes Low fat diet followed?  Yes - has improved since last visit and dietary couseling Low carb diet followed?  Yes  Exercise?  Yes - walking   Most recent NMR [employer] lipomed panel and 11/30/2014 showed : LDL-P 860,   Tg = 751, HDL = 17 and total chol = 183  last A1C was 7.5% most recently     Component Value Date/Time   CHOL 183 11/30/2014 0803    Assessment: hyperlipidemia with history of CAD Type 2 DM with worsening A1c since medication change Elevated serum creatinine - stable GERD - well controlled  Recommendations: 1.  Continue Crestor at 1077m tablet daily.  Continue fenofibrate 135m58mily.  Recheck lipids in 1-2 today. 2.  Continue glimepiride 4mg 75mablet daily. Checking BMET today - if serum creatinine is stable then will consider adding low dose of metformin  3.  OK to continue Nexium until current prescription is used up.  Then start pantoprazole 40mg 31mblet daily   Orders Placed This Encounter   Procedures  . CMP14+EGFR  . Lipid panel  . LDL Cholesterol, Direct    Isabellah Sobocinski Cherre RobinsmD, CPP, CDE

## 2015-02-04 LAB — CMP14+EGFR
ALT: 35 IU/L (ref 0–44)
AST: 40 IU/L (ref 0–40)
Albumin/Globulin Ratio: 1.5 (ref 1.1–2.5)
Albumin: 4.4 g/dL (ref 3.5–4.8)
Alkaline Phosphatase: 64 IU/L (ref 39–117)
BUN/Creatinine Ratio: 12 (ref 10–22)
BUN: 19 mg/dL (ref 8–27)
Bilirubin Total: 0.3 mg/dL (ref 0.0–1.2)
CO2: 20 mmol/L (ref 18–29)
Calcium: 9.6 mg/dL (ref 8.6–10.2)
Chloride: 97 mmol/L (ref 97–108)
Creatinine, Ser: 1.57 mg/dL — ABNORMAL HIGH (ref 0.76–1.27)
GFR calc Af Amer: 50 mL/min/{1.73_m2} — ABNORMAL LOW (ref >59)
GFR calc non Af Amer: 43 mL/min/{1.73_m2} — ABNORMAL LOW (ref >59)
Globulin, Total: 3 g/dL (ref 1.5–4.5)
Glucose: 175 mg/dL — ABNORMAL HIGH (ref 65–99)
Potassium: 4.4 mmol/L (ref 3.5–5.2)
Sodium: 138 mmol/L (ref 134–144)
Total Protein: 7.4 g/dL (ref 6.0–8.5)

## 2015-02-04 LAB — LDL CHOLESTEROL, DIRECT: LDL Direct: 73 mg/dL (ref 0–99)

## 2015-02-04 LAB — LIPID PANEL
Chol/HDL Ratio: 8.9 ratio units — ABNORMAL HIGH (ref 0.0–5.0)
Cholesterol, Total: 160 mg/dL (ref 100–199)
HDL: 18 mg/dL — ABNORMAL LOW (ref >39)
Triglycerides: 683 mg/dL (ref 0–149)

## 2015-02-08 LAB — HM DIABETES EYE EXAM

## 2015-02-10 ENCOUNTER — Telehealth: Payer: Self-pay | Admitting: Pharmacist

## 2015-02-10 MED ORDER — METFORMIN HCL ER 500 MG PO TB24: 500.0000 mg | ORAL_TABLET | Freq: Every day | ORAL | Status: DC

## 2015-02-10 MED ORDER — ROSUVASTATIN CALCIUM 10 MG PO TABS: 10.0000 mg | ORAL_TABLET | Freq: Every day | ORAL | Status: DC

## 2015-02-10 NOTE — Telephone Encounter (Signed)
Triglycerides are very elevated but LDL at goal. Recommend continue fenofibrate and crestor 10mg .  Since serum creatinine is stable will also start metformin XR 500mg  1 tablet daily with food. Triglycerides should improve with better BG control

## 2015-02-14 ENCOUNTER — Telehealth: Payer: Self-pay | Admitting: Family Medicine

## 2015-02-16 NOTE — Telephone Encounter (Signed)
[facility] supply calling to see if we got fax for diabetic testing supplies.  Please call [facility] at [phone] ref# [id number]

## 2015-02-19 ENCOUNTER — Other Ambulatory Visit: Payer: Self-pay | Admitting: Family Medicine

## 2015-03-21 ENCOUNTER — Other Ambulatory Visit: Payer: Self-pay | Admitting: Family Medicine

## 2015-04-12 ENCOUNTER — Encounter: Payer: Self-pay | Admitting: Family Medicine

## 2015-04-12 ENCOUNTER — Ambulatory Visit (INDEPENDENT_AMBULATORY_CARE_PROVIDER_SITE_OTHER): Payer: Medicare Other | Admitting: Family Medicine

## 2015-04-12 VITALS — BP 124/65 | HR 72 | Temp 97.0°F | Ht 67.0 in | Wt 193.0 lb

## 2015-04-12 DIAGNOSIS — I10 Essential (primary) hypertension: Secondary | ICD-10-CM | POA: Diagnosis not present

## 2015-04-12 DIAGNOSIS — N400 Enlarged prostate without lower urinary tract symptoms: Secondary | ICD-10-CM

## 2015-04-12 DIAGNOSIS — E785 Hyperlipidemia, unspecified: Secondary | ICD-10-CM

## 2015-04-12 DIAGNOSIS — E559 Vitamin D deficiency, unspecified: Secondary | ICD-10-CM

## 2015-04-12 DIAGNOSIS — I2510 Atherosclerotic heart disease of native coronary artery without angina pectoris: Secondary | ICD-10-CM

## 2015-04-12 DIAGNOSIS — E119 Type 2 diabetes mellitus without complications: Secondary | ICD-10-CM | POA: Diagnosis not present

## 2015-04-12 LAB — POCT CBC
Granulocyte percent: 64.4 %G (ref 37–80)
HCT, POC: 43.4 % — AB (ref 43.5–53.7)
Hemoglobin: 13.6 g/dL — AB (ref 14.1–18.1)
Lymph, poc: 2.1 (ref 0.6–3.4)
MCH, POC: 28.2 pg (ref 27–31.2)
MCHC: 31.4 g/dL — AB (ref 31.8–35.4)
MCV: 90 fL (ref 80–97)
MPV: 9 fL (ref 0–99.8)
POC Granulocyte: 4.4 (ref 2–6.9)
POC LYMPH PERCENT: 30.2 %L (ref 10–50)
Platelet Count, POC: 198 10*3/uL (ref 142–424)
RBC: 4.83 M/uL (ref 4.69–6.13)
RDW, POC: 13.6 %
WBC: 6.9 10*3/uL (ref 4.6–10.2)

## 2015-04-12 LAB — POCT GLYCOSYLATED HEMOGLOBIN (HGB A1C): Hemoglobin A1C: 7

## 2015-04-12 LAB — POCT UA - MICROALBUMIN: Microalbumin Ur, POC: 50 mg/L

## 2015-04-12 NOTE — Progress Notes (Signed)
Subjective:    Patient ID: Timothy [name], male    DOB: 20-Jan-1941, 74 y.o.   MRN: [id number]  HPI Pt here for follow up and management of chronic medical problems which includes diabetes, hypertension, and hyperlipidemia. He is taking medications regularly. The patient has no complaints. He will get lab work done today and a urine microalbumin today. No refills are necessary today. The patient has been doing well with no specific complaints. He has seen the ophthalmologist recently and over the past 6 weeks has had 2 cataract surgeries. He is still working on getting his eyes adjusted to the cataract removal and indicates the ophthalmologist may have to give him some glasses for a close vision. He denies chest pain or shortness of breath or GI symptoms. He is taking Nexium and this is controlling his reflux well. He does dribble after voiding but his voiding is good other than that. He has an upcoming visit with the cardiologist which is a routine visit on a yearly basis. He also indicates his home blood sugars have been running between 145 and 150.         Patient Active Problem List   Diagnosis Date Noted  . Glaucoma 12/27/2014  . Overweight(278.02) 06/27/2011  . ASCVD (arteriosclerotic cardiovascular disease)   . Essential hypertension, benign   . Hyperplasia of prostate   . 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 Colon polyp   . ESSENTIAL HYPERTENSION, BENIGN 06/28/2010  . Type 2 diabetes mellitus 05/24/2010  . Hyperlipidemia LDL goal <70 05/13/2009  . CAD 05/13/2009   Outpatient Encounter Prescriptions as of 04/12/2015  Medication Sig  . aspirin 81 MG EC tablet Take 81 mg by mouth daily.    . AZOR 5-40 MG per tablet TAKE 1 TABLET DAILY  . Choline Fenofibrate (FENOFIBRIC ACID) 135 MG CPDR TAKE (1) CAPSULE DAILY  . glimepiride (AMARYL) 4 MG tablet Take 1 tablet (4 mg total) by mouth daily before breakfast.  . hydrochlorothiazide (HYDRODIURIL) 25 MG  tablet TAKE 1 TABLET DAILY  . latanoprost (XALATAN) 0.005 % ophthalmic solution Place 1 drop into both eyes at bedtime.  . metFORMIN (GLUCOPHAGE-XR) 500 MG 24 hr tablet Take 1 tablet (500 mg total) by mouth daily with breakfast.  . metoprolol succinate (TOPROL-XL) 50 MG 24 hr tablet TAKE 1 TABLET ONCE A DAY WITH A MEAL  . NEXIUM 40 MG capsule TAKE (1) CAPSULE DAILY (Patient taking differently: TAKE (1) CAPSULE every other day)  . pantoprazole (PROTONIX) 40 MG tablet Take 1 tablet (40 mg total) by mouth daily.  . rosuvastatin (CRESTOR) 10 MG tablet Take 1 tablet (10 mg total) by mouth daily.  . traMADol (ULTRAM) 50 MG tablet Take 1 tablet (50 mg total) by mouth every 6 (six) hours as needed.  . nitroGLYCERIN (NITROSTAT) 0.4 MG SL tablet Place 1 tablet (0.4 mg total) under the tongue every 5 (five) minutes as needed. (Patient not taking: Reported on 04/12/2015)  . [DISCONTINUED] AZOR 5-40 MG per tablet TAKE 1 TABLET DAILY    Review of Systems  Constitutional: Negative.   HENT: Negative.   Eyes: Negative.   Respiratory: Negative.   Cardiovascular: Negative.   Gastrointestinal: Negative.   Endocrine: Negative.   Genitourinary: Negative.   Musculoskeletal: Negative.   Skin: Negative.   Allergic/Immunologic: Negative.   Neurological: Negative.   Hematological: Negative.   Psychiatric/Behavioral: Negative.        Objective:   Physical Exam  Constitutional: He is oriented to  person, place, and time. He appears well-developed and well-nourished. No distress.  HENT:  Head: Normocephalic and atraumatic.  Right Ear: External ear normal.  Left Ear: External ear normal.  Nose: Nose normal.  Mouth/Throat: Oropharynx is clear and moist. No oropharyngeal exudate.  Eyes: Conjunctivae and EOM are normal. Pupils are equal, round, and reactive to light. Right eye exhibits no discharge. Left eye exhibits no discharge. No scleral icterus.  Neck: Normal range of motion. Neck supple. No thyromegaly  present.  No anterior cervical adenopathy or carotid bruits  Cardiovascular: Normal rate, regular rhythm, normal heart sounds and intact distal pulses.   No murmur heard. At 60/m  Pulmonary/Chest: Effort normal and breath sounds normal. No respiratory distress. He has no wheezes. He has no rales. He exhibits no tenderness.  Abdominal: Soft. Bowel sounds are normal. He exhibits no mass. There is no tenderness. There is no rebound and no guarding.  Some abdominal obesity without masses tenderness or organ enlargement  Musculoskeletal: Normal range of motion. He exhibits no edema or tenderness.  Lymphadenopathy:    He has no cervical adenopathy.  Neurological: He is alert and oriented to person, place, and time. He has normal reflexes. No cranial nerve deficit.  Skin: Skin is warm and dry. No rash noted. No erythema. No pallor.  Psychiatric: He has a normal mood and affect. His behavior is normal. Judgment and thought content normal.  Nursing note and vitals reviewed.  BP 124/65 mmHg  Pulse 72  Temp(Src) 97 F (36.1 C) (Oral)  Ht '5\' 7"'  (1.702 m)  Wt 193 lb (87.544 kg)  BMI 30.22 kg/m2        Assessment & Plan:  1. Type 2 diabetes mellitus without complication -According to patient his blood sugars have been running higher fasting in the 145-150 range and we will adjust his medication if necessary once lab work is returned - POCT CBC - POCT glycosylated hemoglobin (Hb A1C) - BMP8+EGFR - POCT UA - Microalbumin  2. Hyperlipidemia LDL goal <70 -The patient may be a candidate for PCS K-9 inhibitors therapy. We will look into this further when lab work is returned. He should continue his current medication for cholesterol and aggressive therapeutic lifestyle changes - POCT CBC - NMR, lipoprofile  3. ASCVD (arteriosclerotic cardiovascular disease) -He should keep his follow-up appointment with the cardiologist - POCT CBC - BMP8+EGFR - Hepatic function panel - NMR,  lipoprofile  4. Essential hypertension, benign -The blood pressure is good today he should continue with current treatment - POCT CBC - BMP8+EGFR - Hepatic function panel - POCT UA - Microalbumin  5. Vitamin D deficiency -Continue with current treatment pending results of lab work - POCT CBC - Vit D  25 hydroxy (rtn osteoporosis monitoring)  6. BPH (benign prostatic hyperplasia) -His stream is good he just has some dribbling after voiding and we will continue to monitor this. - POCT CBC  Patient Instructions                       Medicare Annual Wellness Visit  [facility] and the medical providers at [facility] strive to bring you the best medical care.  In doing so we not only want to address your current medical conditions and concerns but also to detect new conditions early and prevent illness, disease and health-related problems.    Medicare offers a yearly Wellness Visit which allows our clinical staff to assess your need for preventative services including immunizations,  lifestyle education, counseling to decrease risk of preventable diseases and screening for fall risk and other medical concerns.    This visit is provided free of charge (no copay) for all Medicare recipients. The clinical pharmacists at [facility] have begun to conduct these Wellness Visits which will also include a thorough review of all your medications.    As you primary medical provider recommend that you make an appointment for your Annual Wellness Visit if you have not done so already this year.  You may set up this appointment before you leave today or you may call back ([phone]) and schedule an appointment.  Please make sure when you call that you mention that you are scheduling your Annual Wellness Visit with the clinical pharmacist so that the appointment may be made for the proper length of time.     Continue current medications. Continue good  therapeutic lifestyle changes which include good diet and exercise. Fall precautions discussed with patient. If an FOBT was given today- please return it to our front desk. If you are over 57 years old - you may need Prevnar 65 or the adult Pneumonia vaccine.  Flu Shots are still available at our office. If you still haven't had one please call to set up a nurse visit to get one.   After your visit with [place] today you will receive a survey in the mail or online from [employer] regarding your care with [place]. Please take a moment to fill this out. Your feedback is very important to [place] as you can help [place] better understand your patient needs as well as improve your experience and satisfaction. WE CARE ABOUT YOU!!!   The patient will schedule for a wellness visit with the clinical pharmacists. He will follow-up with the ophthalmologist as planned He will also follow-up with the cardiologist as planned When blood sugars are returned and the A1c is returned we will consider increasing his Amaryl to 4 mg in the morning and 2 mg in the evening. He also may be a candidate for the PCS K-9 inhibitor therapy.   Arrie Senate MD

## 2015-04-12 NOTE — Addendum Note (Signed)
Addended by: Earlene Plater on: 04/12/2015 09:07 AM   Modules accepted: Orders

## 2015-04-12 NOTE — Patient Instructions (Addendum)
Medicare Annual Wellness Visit  [facility] and the medical providers at [facility] strive to bring you the best medical care.  In doing so we not only want to address your current medical conditions and concerns but also to detect new conditions early and prevent illness, disease and health-related problems.    Medicare offers a yearly Wellness Visit which allows our clinical staff to assess your need for preventative services including immunizations, lifestyle education, counseling to decrease risk of preventable diseases and screening for fall risk and other medical concerns.    This visit is provided free of charge (no copay) for all Medicare recipients. The clinical pharmacists at [facility] have begun to conduct these Wellness Visits which will also include a thorough review of all your medications.    As you primary medical provider recommend that you make an appointment for your Annual Wellness Visit if you have not done so already this year.  You may set up this appointment before you leave today or you may call back [phone] and schedule an appointment.  Please make sure when you call that you mention that you are scheduling your Annual Wellness Visit with the clinical pharmacist so that the appointment may be made for the proper length of time.     Continue current medications. Continue good therapeutic lifestyle changes which include good diet and exercise. Fall precautions discussed with patient. If an FOBT was given today- please return it to our front desk. If you are over 69 years old - you may need Prevnar 55 or the adult Pneumonia vaccine.  Flu Shots are still available at our office. If you still haven't had one please call to set up a nurse visit to get one.   After your visit with [place] today you will receive a survey in the mail or online from [employer] regarding your care with [place]. Please take a moment to  fill this out. Your feedback is very important to [place] as you can help [place] better understand your patient needs as well as improve your experience and satisfaction. WE CARE ABOUT YOU!!!   The patient will schedule for a wellness visit with the clinical pharmacists. He will follow-up with the ophthalmologist as planned He will also follow-up with the cardiologist as planned When blood sugars are returned and the A1c is returned we will consider increasing his Amaryl to 4 mg in the morning and 2 mg in the evening. He also may be a candidate for the PCS K-9 inhibitor therapy.

## 2015-04-13 ENCOUNTER — Telehealth: Payer: Self-pay | Admitting: *Deleted

## 2015-04-13 LAB — BMP8+EGFR
BUN/Creatinine Ratio: 15 (ref 10–22)
BUN: 24 mg/dL (ref 8–27)
CO2: 20 mmol/L (ref 18–29)
Calcium: 9.7 mg/dL (ref 8.6–10.2)
Chloride: 98 mmol/L (ref 97–108)
Creatinine, Ser: 1.58 mg/dL — ABNORMAL HIGH (ref 0.76–1.27)
GFR calc Af Amer: 49 mL/min/{1.73_m2} — ABNORMAL LOW (ref >59)
GFR calc non Af Amer: 43 mL/min/{1.73_m2} — ABNORMAL LOW (ref >59)
Glucose: 162 mg/dL — ABNORMAL HIGH (ref 65–99)
Potassium: 4.2 mmol/L (ref 3.5–5.2)
Sodium: 135 mmol/L (ref 134–144)

## 2015-04-13 LAB — NMR, LIPOPROFILE
Cholesterol: 165 mg/dL (ref 100–199)
HDL Cholesterol by NMR: 14 mg/dL — ABNORMAL LOW (ref >39)
HDL Particle Number: 18.1 umol/L — ABNORMAL LOW (ref >=30.5)
LDL Particle Number: 1061 nmol/L — ABNORMAL HIGH (ref <1000)
LDL Size: 19.6 nm (ref >20.5)
LP-IR Score: 65 — ABNORMAL HIGH (ref <=45)
Small LDL Particle Number: 933 nmol/L — ABNORMAL HIGH (ref <=527)
Triglycerides by NMR: 587 mg/dL (ref 0–149)

## 2015-04-13 LAB — MICROALBUMIN, URINE: Microalbumin, Urine: 47.4 ug/mL — ABNORMAL HIGH (ref 0.0–17.0)

## 2015-04-13 LAB — HEPATIC FUNCTION PANEL
ALT: 40 IU/L (ref 0–44)
AST: 42 IU/L — ABNORMAL HIGH (ref 0–40)
Albumin: 4.6 g/dL (ref 3.5–4.8)
Alkaline Phosphatase: 54 IU/L (ref 39–117)
Bilirubin Total: 0.4 mg/dL (ref 0.0–1.2)
Bilirubin, Direct: 0.13 mg/dL (ref 0.00–0.40)
Total Protein: 7.5 g/dL (ref 6.0–8.5)

## 2015-04-13 LAB — VITAMIN D 25 HYDROXY (VIT D DEFICIENCY, FRACTURES): Vit D, 25-Hydroxy: 16.7 ng/mL — ABNORMAL LOW (ref 30.0–100.0)

## 2015-04-13 NOTE — Telephone Encounter (Signed)
-----   Message from Chipper Herb, MD sent at 04/13/2015 10:32 AM EDT ----- The patient's hemoglobin A1c was good at 7.0% and at this time I do not think there is any reason to increase his blood sugar medicines any more. Please make sure that he records readings and bring these readings in with him when he comes in for his yearly Medicare visit with Tam[facility]. The blood sugar was elevated at 162. The creatinine, the most important kidney function test remains stable at 1.58. This is consistent with readings over the past year. The electrolytes including potassium are good. 1 liver function test is slightly elevated but this is okay. All other wounds are within normal limits. The total LDL particle number with advanced lipid testing is slightly elevated at 1061. The triglycerides are all her and remain very elevated at 587. Please see there were 751.+++++ this is very important, please have the patient discuss his continued elevation of triglycerides with the clinical pharmacists at his Medicare wellness visit.++++++ The vitamin D level remains low.------ because the patient has an elevated creatinine we cannot increase the vitamin D anymore than he is currently taking.

## 2015-04-13 NOTE — Telephone Encounter (Signed)
Pt notified of results Verbalizes understanding 

## 2015-05-13 ENCOUNTER — Encounter: Payer: Self-pay | Admitting: Pharmacist

## 2015-05-13 ENCOUNTER — Ambulatory Visit (INDEPENDENT_AMBULATORY_CARE_PROVIDER_SITE_OTHER): Payer: Medicare Other | Admitting: Pharmacist

## 2015-05-13 VITALS — BP 138/80 | HR 76 | Ht 67.0 in | Wt 196.5 lb

## 2015-05-13 DIAGNOSIS — N183 Chronic kidney disease, stage 3 unspecified: Secondary | ICD-10-CM

## 2015-05-13 DIAGNOSIS — Z0000 Encounter for general adult medical examination without abnormal findings: Secondary | ICD-10-CM

## 2015-05-13 DIAGNOSIS — E1122 Type 2 diabetes mellitus with diabetic chronic kidney disease: Secondary | ICD-10-CM

## 2015-05-13 MED ORDER — GLIMEPIRIDE 4 MG PO TABS: 4.0000 mg | ORAL_TABLET | Freq: Two times a day (BID) | ORAL | Status: DC

## 2015-05-13 NOTE — Patient Instructions (Addendum)
Timothy [name] , Thank you for taking time to come for your Medicare Wellness Visit. I appreciate your ongoing commitment to your health goals. Please review the following plan we discussed and let me know if I can assist you in the future.    This is a list of the screening recommended for you and due dates:  Health Maintenance  Topic Date Due  . Flu Shot  07/18/2015  . Hemoglobin A1C  10/12/2015  . Eye exam for diabetics  03/21/2016  . Complete foot exam   04/11/2016  . Urine Protein Check  04/11/2016  . Colon Cancer Screening  06/16/2017  . Tetanus Vaccine  09/16/2021  . Shingles Vaccine  Completed  . Pneumonia vaccines  Completed    Hypoglycemia Hypoglycemia occurs when the glucose in your blood is too low. Glucose is a type of sugar that is your body's main energy source. Hormones, such as insulin and glucagon, control the level of glucose in the blood. Insulin lowers blood glucose and glucagon increases blood glucose. Having too much insulin in your blood stream, or not eating enough food containing sugar, can result in hypoglycemia. Hypoglycemia can happen to people with or without diabetes. It can develop quickly and can be a medical emergency.  CAUSES   Missing or delaying meals.  Not eating enough carbohydrates at meals.  Taking too much diabetes medicine.  Not timing your oral diabetes medicine or insulin doses with meals, snacks, and exercise.  Nausea and vomiting.  Certain medicines.  Severe illnesses, such as hepatitis, kidney disorders, and certain eating disorders.  Increased activity or exercise without eating something extra or adjusting medicines.  Drinking too much alcohol.  A nerve disorder that affects body functions like your heart rate, blood pressure, and digestion (autonomic neuropathy).  A condition where the stomach muscles do not function properly (gastroparesis). Therefore, medicines and food may not absorb properly.  Rarely, a tumor of the  pancreas can produce too much insulin. SYMPTOMS   Hunger.  Sweating (diaphoresis).  Change in body temperature.  Shakiness.  Headache.  Anxiety.  Lightheadedness.  Irritability.  Difficulty concentrating.  Dry mouth.  Tingling or numbness in the hands or feet.  Restless sleep or sleep disturbances.  Altered speech and coordination.  Change in mental status.  Seizures or prolonged convulsions.  Combativeness.  Drowsiness (lethargic).  Weakness.  Increased heart rate or palpitations.  Confusion.  Pale, gray skin color.  Blurred or double vision.  Fainting. DIAGNOSIS  A physical exam and medical history will be performed. Your caregiver may make a diagnosis based on your symptoms. Blood tests and other lab tests may be performed to confirm a diagnosis. Once the diagnosis is made, your caregiver will see if your signs and symptoms go away once your blood glucose is raised.  TREATMENT  Usually, you can easily treat your hypoglycemia when you notice symptoms.  Check your blood glucose. If it is less than 70 mg/dl, take one of the following:   3-4 glucose tablets.    cup juice.    cup regular soda.   1 cup skim milk.   -1 tube of glucose gel.   5-6 hard candies.   Avoid high-fat drinks or food that may delay a rise in blood glucose levels.  Do not take more than the recommended amount of sugary foods, drinks, gel, or tablets. Doing so will cause your blood glucose to go too high.   Wait 10-15 minutes and recheck your blood glucose. If it is  still less than 70 mg/dl or below your target range, repeat treatment.   Eat a snack if it is more than 1 hour until your next meal.  There may be a time when your blood glucose may go so low that you are unable to treat yourself at home when you start to notice symptoms. You may need someone to help you. You may even faint or be unable to swallow. If you cannot treat yourself, someone will need to  bring you to the hospital.  [place]  If you have diabetes, follow your diabetes management plan by:  Taking your medicines as directed.  Following your exercise plan.  Following your meal plan. Do not skip meals. Eat on time.  Testing your blood glucose regularly. Check your blood glucose before and after exercise. If you exercise longer or different than usual, be sure to check blood glucose more frequently.  Wearing your medical alert jewelry that says you have diabetes.  Identify the cause of your hypoglycemia. Then, develop ways to prevent the recurrence of hypoglycemia.  Do not take a hot bath or shower right after an insulin shot.  Always carry treatment with you. Glucose tablets are the easiest to carry.  If you are going to drink alcohol, drink it only with meals.  Tell friends or family members ways to keep you safe during a seizure. This may include removing hard or sharp objects from the area or turning you on your side.  Maintain a healthy weight. SEEK MEDICAL CARE IF:   You are having problems keeping your blood glucose in your target range.  You are having frequent episodes of hypoglycemia.  You feel you might be having side effects from your medicines.  You are not sure why your blood glucose is dropping so low.  You notice a change in vision or a new problem with your vision. SEEK IMMEDIATE MEDICAL CARE IF:   Confusion develops.  A change in mental status occurs.  The inability to swallow develops.  Fainting occurs. Document Released: 12/03/2005 Document Revised: 12/08/2013 Document Reviewed: 03/31/2012 [facility] Patient Information 2015 [place], [place]. This information is not intended to replace advice given to you by your health care provider. Make sure you discuss any questions you have with your health care provider.

## 2015-05-13 NOTE — Progress Notes (Signed)
Patient ID: Timothy [name], male   DOB: 1941-12-04, 74 y.o.   MRN: [id number]    Subjective:   Timothy [name] is a 74 y.o. male who presents for an Initial Medicare Annual Wellness Visit and recheck type 2 DM  Patient reports HBG readings fasting in am 150 to 180 Last A1c was 7.0%  He previously was taking Kombiglyze but was stopped in 2015 due to elevated serum creatinine.   Serum creatinine is now stable and metformin 500mg  1 tablet qam with food was restarted 01/2015 and serum creatinine has remained stable.  He is also taking glimepiride 4mg  1 tablet each morning.  Last urine mircoalbumin was elevated.     Current Medications (verified) Outpatient Encounter Prescriptions as of 05/13/2015  Medication Sig  . aspirin 81 MG EC tablet Take 81 mg by mouth daily.    . AZOR 5-40 MG per tablet TAKE 1 TABLET DAILY  . Choline Fenofibrate (FENOFIBRIC ACID) 135 MG CPDR TAKE (1) CAPSULE DAILY  . glimepiride (AMARYL) 4 MG tablet Take 1 tablet (4 mg total) by mouth 2 (two) times daily.  . hydrochlorothiazide (HYDRODIURIL) 25 MG tablet Take 1 tablet (25 mg total) by mouth every morning.  . latanoprost (XALATAN) 0.005 % ophthalmic solution Place 1 drop into both eyes at bedtime.  . metFORMIN (GLUCOPHAGE-XR) 500 MG 24 hr tablet Take 1 tablet (500 mg total) by mouth daily with breakfast.  . metoprolol succinate (TOPROL-XL) 50 MG 24 hr tablet TAKE 1 TABLET ONCE A DAY WITH A MEAL  . NEXIUM 40 MG capsule TAKE (1) CAPSULE DAILY  . rosuvastatin (CRESTOR) 10 MG tablet Take 1 tablet (10 mg total) by mouth daily.  . [DISCONTINUED] glimepiride (AMARYL) 4 MG tablet Take 1 tablet (4 mg total) by mouth daily before breakfast.  . [DISCONTINUED] hydrochlorothiazide (HYDRODIURIL) 25 MG tablet TAKE 1 TABLET DAILY  . nitroGLYCERIN (NITROSTAT) 0.4 MG SL tablet Place 1 tablet (0.4 mg total) under the tongue every 5 (five) minutes as needed. (Patient not taking: Reported on 04/12/2015)  . traMADol (ULTRAM) 50 MG tablet  Take 1 tablet (50 mg total) by mouth every 6 (six) hours as needed. (Patient not taking: Reported on 05/13/2015)  . [DISCONTINUED] pantoprazole (PROTONIX) 40 MG tablet Take 1 tablet (40 mg total) by mouth daily. (Patient not taking: Reported on 05/13/2015)   No facility-administered encounter medications on file as of 05/13/2015.    Allergies (verified) Hydrocodone-acetaminophen   History: Past Medical History  Diagnosis Date  . ASCVD (arteriosclerotic cardiovascular disease)   . Essential hypertension, benign   . Hyperplasia of prostate   . 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 Colon polyp   . Coronary artery disease     (left main normal, LAD 25-30% stenosis, distal 30-40% stenosis, circumflex obtuse marginal 50% stenosis,  right coronary artery dominant with long 75% followed by mid 80%  stenosis followed by 9% stenosis at the ostium of the PDA.  He had  stenting of the PDA by Dr. [name].  This was a Cypher stent.   First angioplasty was 1994, Cath 2011 100% RCA ).      . Cataract   . Diabetes mellitus without complication   . Glaucoma   . Chronic kidney disease    Past Surgical History  Procedure Laterality Date  . Bilateral hernia repair    . Angioplasty with 3 stents    . Androplasty    . Cholestectomy    . Appendectomy    .  Cervical spine surgery    . Eye surgery Bilateral     cataracts  . Cholecystectomy     Family History  Problem Relation Age of Onset  . Cancer Mother   . Glaucoma Mother   . Heart disease Father   . Coronary artery disease Father   . Cancer Sister   . Cancer Brother 52    colon  . Nephritis Sister     20's   Social History   Occupational History  . Not on file.   Social History Main Topics  . Smoking status: Former Smoker    Quit date: 08/14/1991  . Smokeless tobacco: Never Used  . Alcohol Use: No  . Drug Use: No  . Sexual Activity: Yes    Do you feel safe at home?  Yes  Dietary issues and exercise  activities discussed: Current Exercise Habits:: The patient has a physically strenous job, but has no regular exercise apart from work.  Current Dietary habits:  Tries to limit CHO intake but admits to times of indiscretion    Cardiac Risk Factors include: advanced age (>70men, >30 women);obesity (BMI >30kg/m2);microalbuminuria;diabetes mellitus;dyslipidemia;family history of premature cardiovascular disease;hypertension;male gender  Objective:    Today's Vitals   05/13/15 0936  BP: 138/80  Pulse: 76  Height: 5\' 7"  (1.702 m)  Weight: 196 lb 8 oz (89.132 kg)   Body mass index is 30.77 kg/(m^2).   Activities of Daily Living In your present state of health, do you have any difficulty performing the following activities: 05/13/2015  Hearing? N  Vision? N  Difficulty concentrating or making decisions? N  Walking or climbing stairs? Y  Dressing or bathing? N  Doing errands, shopping? N  Preparing Food and eating ? N  Using the Toilet? N  In the past six months, have you accidently leaked urine? N  Do you have problems with loss of bowel control? N  Managing your Medications? N  Managing your Finances? N  Housekeeping or managing your Housekeeping? N    Are there smokers in your home (other than you)? No    Depression Screen PHQ 2/9 Scores 05/13/2015 04/12/2015 11/26/2014 07/26/2014  PHQ - 2 Score 0 0 0 0    Fall Risk Fall Risk  05/13/2015 04/12/2015 11/26/2014 07/26/2014  Falls in the past year? No No No No    Cognitive Function: MMSE - Mini Mental State Exam 05/13/2015 05/13/2015 05/13/2015  Orientation to time 5 5 5   Orientation to Place 5 5 -  Registration 3 3 -  Attention/ Calculation 5 5 -  Recall 2 2 -  Language- name 2 objects 2 - -  Language- repeat 1 1 -  Language- follow 3 step command 3 3 -  Language- read & follow direction 1 1 -  Write a sentence 1 1 -  Copy design 1 1 -  Total score 29 - -    Immunizations and Health Maintenance Immunization History    Administered Date(s) Administered  . Influenza Whole 08/17/2010  . Influenza,inj,Quad PF,36+ Mos 09/28/2013, 10/11/2014  . Pneumococcal Conjugate-13 10/11/2014  . Pneumococcal Polysaccharide-23 06/16/2008  . Td 02/15/2003  . Tdap 09/17/2011  . Zoster 05/18/2007   There are no preventive care reminders to display for this patient.  Patient Care Team: Chipper Herb, MD as PCP - General (Family Medicine) Clent Jacks, MD as Consulting Physician (Ophthalmology) Minus Breeding, MD as Consulting Physician (Cardiology)  Indicate any recent Medical Services you may have received from other than [facility] providers  in the past year (date may be approximate).    Assessment:    Annual Wellness Visit Type 2 DM - elevated HBG readings Elevated Microalbumin    Screening Tests Health Maintenance  Topic Date Due  . INFLUENZA VACCINE  07/18/2015  . HEMOGLOBIN A1C  10/12/2015  . OPHTHALMOLOGY EXAM  03/21/2016  . FOOT EXAM  04/11/2016  . URINE MICROALBUMIN  04/11/2016  . COLONOSCOPY  06/16/2017  . TETANUS/TDAP  09/16/2021  . ZOSTAVAX  Completed  . PNA vac Low Risk Adult  Completed        Plan:   During the course of the visit Gernard was educated and counseled about the following appropriate screening and preventive services:   Vaccines to include Pneumoccal, Influenza, Hepatitis B, Td, Zostavax - all UTD  Colorectal cancer screening - colonoscopy UTD and FOBT UTD  Cardiovascular disease screening - LDL at goal; BP at goal today.  Patient has follow up with cardiologist 06/22/2015.  Diabetes - A1c is at goal but HBG readings elevated.  Increase glimepiride 4mg  to BID.  Continue metformin 500mg  QD  Glaucoma screening / Diabetic Eye Exam - UTD - will request notes from Dr Katy Fitch  Nutrition counseling - patient to continue to monitor CHO intake - limit serving sizes of high CHO containing foods  Prostate cancer screening - UTD  Advanced directives - UTD; copy requested   Orders  Placed This Encounter  Procedures  . Microalbumin / creatinine urine ratio     Goals    . Eat more fruits and vegetables     Limit intake of high carbohydrate containing foods.   Increase non-starchy vegetables - carrots, green bean, squash, zucchini, tomatoes, onions, peppers, spinach and other green leafy vegetables, cabbage, lettuce, cucumbers, asparagus, okra (not fried), eggplant limit sugar and processed foods (cakes, cookies, ice cream, crackers and chips) Increase fresh fruit but limit serving sizes 1/2 cup or about the size of tennis or baseball limit red meat to no more than 1-2 times per week (serving size about the size of your palm) Choose whole grains / lean proteins - whole wheat bread, quinoa, whole grain rice (1/2 cup), fish, chicken, [place]     . Weight < 180 lb (81.647 kg)     Increase physical activity and decrease calorie intake        Patient Instructions (the written plan) were given to the patient.   Cherre Robins, PHARMD , CDE, CPP  05/13/2015

## 2015-05-14 LAB — MICROALBUMIN / CREATININE URINE RATIO
Creatinine, Urine: 79.2 mg/dL
MICROALB/CREAT RATIO: 55.3 mg/g creat — ABNORMAL HIGH (ref 0.0–30.0)
Microalbumin, Urine: 43.8 ug/mL

## 2015-05-25 ENCOUNTER — Other Ambulatory Visit: Payer: Self-pay | Admitting: Family Medicine

## 2015-06-13 ENCOUNTER — Other Ambulatory Visit: Payer: Self-pay | Admitting: Family Medicine

## 2015-06-22 ENCOUNTER — Encounter: Payer: Self-pay | Admitting: Cardiology

## 2015-06-22 ENCOUNTER — Ambulatory Visit (INDEPENDENT_AMBULATORY_CARE_PROVIDER_SITE_OTHER): Payer: Medicare Other | Admitting: Cardiology

## 2015-06-22 VITALS — BP 136/77 | HR 71 | Ht 67.5 in | Wt 197.0 lb

## 2015-06-22 DIAGNOSIS — I2510 Atherosclerotic heart disease of native coronary artery without angina pectoris: Secondary | ICD-10-CM | POA: Diagnosis not present

## 2015-06-22 DIAGNOSIS — I10 Essential (primary) hypertension: Secondary | ICD-10-CM

## 2015-06-22 NOTE — Progress Notes (Signed)
HPI The patient presents for one year followup. Since I last saw him he has gained a little bit of weight. He's having a little more dyspnea with activities. However, he's been able to do some things such as push a lawnmower without bringing on any of the chest pain he had previously. He might get a little more dyspneic with this kind of activity but he recovers quickly. He does get some vague discomfort which is really shortness of breath which he describes across his upper abdomen lower ribs when he is breathing hard. He's not having any resting shortness of breath, PND or orthopnea. He's not having any substernal chest pressure, neck or arm discomfort. He's had no edema.  Allergies  Allergen Reactions  . Hydrocodone-Acetaminophen Other (See Comments)    Makes pt. Feel loopy    Current Outpatient Prescriptions  Medication Sig Dispense Refill  . aspirin 81 MG EC tablet Take 81 mg by mouth daily.      . AZOR 5-40 MG per tablet TAKE 1 TABLET DAILY 90 tablet 0  . Choline Fenofibrate (FENOFIBRIC ACID) 135 MG CPDR TAKE (1) CAPSULE DAILY 90 capsule 0  . glimepiride (AMARYL) 4 MG tablet Take 1 tablet (4 mg total) by mouth 2 (two) times daily. 180 tablet 1  . hydrochlorothiazide (HYDRODIURIL) 25 MG tablet Take 1 tablet (25 mg total) by mouth every morning. 90 tablet 0  . latanoprost (XALATAN) 0.005 % ophthalmic solution Place 1 drop into both eyes at bedtime.    . metFORMIN (GLUCOPHAGE-XR) 500 MG 24 hr tablet Take 1 tablet (500 mg total) by mouth daily with breakfast. 90 tablet 1  . metoprolol succinate (TOPROL-XL) 50 MG 24 hr tablet TAKE 1 TABLET ONCE A DAY WITH A MEAL 90 tablet 0  . NEXIUM 40 MG capsule TAKE (1) CAPSULE DAILY 90 capsule 0  . nitroGLYCERIN (NITROSTAT) 0.4 MG SL tablet Place 1 tablet (0.4 mg total) under the tongue every 5 (five) minutes as needed. 25 tablet 3  . rosuvastatin (CRESTOR) 10 MG tablet Take 1 tablet (10 mg total) by mouth daily. 90 tablet 1  . traMADol (ULTRAM) 50  MG tablet Take 1 tablet (50 mg total) by mouth every 6 (six) hours as needed. (Patient not taking: Reported on 05/13/2015) 30 tablet 0   No current facility-administered medications for this visit.    Past Medical History  Diagnosis Date  . ASCVD (arteriosclerotic cardiovascular disease)   . Essential hypertension, benign   . Hyperplasia of prostate   . 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 Colon polyp   . Coronary artery disease     (left main normal, LAD 25-30% stenosis, distal 30-40% stenosis, circumflex obtuse marginal 50% stenosis,  right coronary artery dominant with long 75% followed by mid 80%  stenosis followed by 9% stenosis at the ostium of the PDA.  He had  stenting of the PDA by Dr. [name].  This was a Cypher stent.   First angioplasty was 1994, Cath 2011 100% RCA ).      . Cataract   . Glaucoma   . Chronic kidney disease     Past Surgical History  Procedure Laterality Date  . Bilateral hernia repair    . Angioplasty with 3 stents    . Androplasty    . Cholestectomy    . Appendectomy    . Cervical spine surgery    . Eye surgery Bilateral     cataracts  . Cholecystectomy  ROS:  As stated in the HPI and negative for all other systems.  PHYSICAL EXAM BP 136/77 mmHg  Pulse 71  Ht 5' 7.5" (1.715 m)  Wt 197 lb (89.359 kg)  BMI 30.38 kg/m2 GENERAL:  Well appearing NECK:  No jugular venous distention, waveform within normal limits, carotid upstroke brisk and symmetric, no bruits, no thyromegaly LUNGS:  Clear to auscultation bilaterally BACK:  No CVA tenderness CHEST:  Unremarkable HEART:  PMI not displaced or sustained,S1 and S2 within normal limits, no S3, no S4, no clicks, no rubs, no murmurs ABD:  Flat, positive bowel sounds normal in frequency in pitch, no bruits, no rebound, no guarding, no midline pulsatile mass, no hepatomegaly, no splenomegaly EXT:  2 plus pulses throughout, no edema, no cyanosis no clubbing   EKG: Sinus  rhythm, rate 71, axis within normal limits, intervals within normal limits, nonspecific ST-T wave changes, early transition in lead V2 , no change from previous  06/22/2015   ASSESSMENT AND PLAN  CAD -  The patient does have some mild shortness of breath with activities but gained weight. He's not having any of the symptoms that was his previous angina. We do plan an exercise regimen of weight loss regimen and if he has increasing dyspnea with this I would consider screening him with stress testing. Routine stress testing without high pretest probability would probably be difficult to interpret given his known occlusive heart disease. In the meantime he needs to continue with aggressive risk reduction.  Essential hypertension, benign -  The blood pressure is at target. No change in medications is indicated. We will continue with therapeutic lifestyle changes (TLC).   DYSLIPIDEMIA -  I will defer to .Dr. Tawanna Sat expertise.

## 2015-06-22 NOTE — Patient Instructions (Signed)
Medication Instructions:  Your physician recommends that you continue on your current medications as directed. Please refer to the Current Medication list given to you today.  Follow-Up: Follow up in 1 year with Dr. Hochrein.  You will receive a letter in the mail 2 months before you are due.  Please call us when you receive this letter to schedule your follow up appointment.   Thank you for choosing [facility]!!       

## 2015-06-27 ENCOUNTER — Other Ambulatory Visit: Payer: Self-pay | Admitting: Family Medicine

## 2015-07-29 ENCOUNTER — Other Ambulatory Visit: Payer: Self-pay | Admitting: Family Medicine

## 2015-08-09 ENCOUNTER — Other Ambulatory Visit: Payer: Self-pay | Admitting: Pharmacist

## 2015-08-16 ENCOUNTER — Encounter: Payer: Self-pay | Admitting: *Deleted

## 2015-08-23 ENCOUNTER — Other Ambulatory Visit: Payer: Self-pay | Admitting: Family Medicine

## 2015-08-30 ENCOUNTER — Ambulatory Visit (INDEPENDENT_AMBULATORY_CARE_PROVIDER_SITE_OTHER): Payer: Medicare Other

## 2015-08-30 ENCOUNTER — Encounter: Payer: Self-pay | Admitting: Family Medicine

## 2015-08-30 ENCOUNTER — Ambulatory Visit (INDEPENDENT_AMBULATORY_CARE_PROVIDER_SITE_OTHER): Payer: Medicare Other | Admitting: Family Medicine

## 2015-08-30 VITALS — BP 137/71 | HR 67 | Temp 97.0°F | Ht 67.0 in | Wt 191.0 lb

## 2015-08-30 DIAGNOSIS — N189 Chronic kidney disease, unspecified: Secondary | ICD-10-CM | POA: Diagnosis not present

## 2015-08-30 DIAGNOSIS — E1122 Type 2 diabetes mellitus with diabetic chronic kidney disease: Secondary | ICD-10-CM | POA: Diagnosis not present

## 2015-08-30 DIAGNOSIS — E1121 Type 2 diabetes mellitus with diabetic nephropathy: Secondary | ICD-10-CM

## 2015-08-30 DIAGNOSIS — M545 Low back pain: Secondary | ICD-10-CM | POA: Diagnosis not present

## 2015-08-30 DIAGNOSIS — E785 Hyperlipidemia, unspecified: Secondary | ICD-10-CM

## 2015-08-30 DIAGNOSIS — I10 Essential (primary) hypertension: Secondary | ICD-10-CM | POA: Diagnosis not present

## 2015-08-30 DIAGNOSIS — I2510 Atherosclerotic heart disease of native coronary artery without angina pectoris: Secondary | ICD-10-CM | POA: Diagnosis not present

## 2015-08-30 DIAGNOSIS — E559 Vitamin D deficiency, unspecified: Secondary | ICD-10-CM | POA: Diagnosis not present

## 2015-08-30 DIAGNOSIS — N400 Enlarged prostate without lower urinary tract symptoms: Secondary | ICD-10-CM

## 2015-08-30 DIAGNOSIS — N183 Chronic kidney disease, stage 3 unspecified: Secondary | ICD-10-CM

## 2015-08-30 LAB — POCT GLYCOSYLATED HEMOGLOBIN (HGB A1C): Hemoglobin A1C: 6.9

## 2015-08-30 MED ORDER — ROSUVASTATIN CALCIUM 10 MG PO TABS: 10.0000 mg | ORAL_TABLET | Freq: Every day | ORAL | Status: DC

## 2015-08-30 MED ORDER — ESOMEPRAZOLE MAGNESIUM 40 MG PO CPDR: DELAYED_RELEASE_CAPSULE | ORAL | Status: DC

## 2015-08-30 MED ORDER — FENOFIBRIC ACID 135 MG PO CPDR
DELAYED_RELEASE_CAPSULE | ORAL | Status: DC
Start: 2015-08-30 — End: 2016-08-31

## 2015-08-30 MED ORDER — GLIMEPIRIDE 4 MG PO TABS: 4.0000 mg | ORAL_TABLET | Freq: Two times a day (BID) | ORAL | Status: DC

## 2015-08-30 MED ORDER — HYDROCHLOROTHIAZIDE 25 MG PO TABS: 25.0000 mg | ORAL_TABLET | Freq: Every day | ORAL | Status: DC

## 2015-08-30 MED ORDER — METOPROLOL SUCCINATE ER 50 MG PO TB24: ORAL_TABLET | ORAL | Status: DC

## 2015-08-30 MED ORDER — AMLODIPINE-OLMESARTAN 5-40 MG PO TABS: 1.0000 | ORAL_TABLET | Freq: Every day | ORAL | Status: DC

## 2015-08-30 MED ORDER — METFORMIN HCL ER 500 MG PO TB24: ORAL_TABLET | ORAL | Status: DC

## 2015-08-30 NOTE — Patient Instructions (Addendum)
Medicare Annual Wellness Visit  [facility] and the medical providers at [facility] strive to bring you the best medical care.  In doing so we not only want to address your current medical conditions and concerns but also to detect new conditions early and prevent illness, disease and health-related problems.    Medicare offers a yearly Wellness Visit which allows our clinical staff to assess your need for preventative services including immunizations, lifestyle education, counseling to decrease risk of preventable diseases and screening for fall risk and other medical concerns.    This visit is provided free of charge (no copay) for all Medicare recipients. The clinical pharmacists at [facility] have begun to conduct these Wellness Visits which will also include a thorough review of all your medications.    As you primary medical provider recommend that you make an appointment for your Annual Wellness Visit if you have not done so already this year.  You may set up this appointment before you leave today or you may call back [phone]) and schedule an appointment.  Please make sure when you call that you mention that you are scheduling your Annual Wellness Visit with the clinical pharmacist so that the appointment may be made for the proper length of time.    Continue current medications. Continue good therapeutic lifestyle changes which include good diet and exercise. Fall precautions discussed with patient. If an FOBT was given today- please return it to our front desk. If you are over 11 years old - you may need Prevnar 6 or the adult Pneumonia vaccine.  **Flu shots will be available soon--- please call and schedule a FLU-CLINIC appointment**  After your visit with [place] today you will receive a survey in the mail or online from [employer] regarding your care with [place]. Please take a moment to fill this out. Your feedback is very  important to [place] as you can help [place] better understand your patient needs as well as improve your experience and satisfaction. WE CARE ABOUT YOU!!!   **Please join [place] SEPT.[email or web address], 2016 from 5:00 to 7:00pm for our OPEN HOUSE! Come out and meet our NEW providers**  We will call you with the x-ray results as soon as they are returned Continue to be as active physically as possible and watch her diet as closely as possible Return to the clinic in October for your flu shot This winter drink plenty of fluids and keep the house as cool as possible

## 2015-08-30 NOTE — Progress Notes (Signed)
Subjective:    Patient ID: Timothy [name], male    DOB: Aug 04, 1941, 74 y.o.   MRN: [id number]  HPI Pt here for follow up and management of chronic medical problems which includes hypertension, hyperlipidemia, and diabetes. He is taking medications regularly. The patient's biggest problem today is his low back pain which mostly is felt in the posterior hip area. He had previous x-rays of his hips which showed mild degenerative changes in December of this past year. Now with certain movements and turning in certain ways and even with coughing he can have severe pain in his back and in the hip areas. He does not recall any injury. The pain is been especially bad for the past month. Otherwise he denies chest pain or shortness of breath. He recently had a visit with the cardiologist for his routine evaluation and everything was good and stable according to the patient. His heartburn and reflux are well controlled with taking the generic Nexium. He denies blood in the stool or black tarry bowel movements and he is not due for a colonoscopy until 2018. He is also not having any problems passing his water. His blood sugars at home and been running around the 160 range early in the morning fasting.       Patient Active Problem List   Diagnosis Date Noted  . CKD (chronic kidney disease) stage 3, GFR 30-59 ml/min 05/13/2015  . Glaucoma 12/27/2014  . Overweight(278.02) 06/27/2011  . ASCVD (arteriosclerotic cardiovascular disease)   . Essential hypertension, benign   . Hyperplasia of prostate   . 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 Colon polyp   . Type 2 diabetes mellitus with diabetic nephropathy 05/24/2010  . Hyperlipidemia LDL goal <70 05/13/2009  . Coronary atherosclerosis 05/13/2009   Outpatient Encounter Prescriptions as of 08/30/2015  Medication Sig  . aspirin 81 MG EC tablet Take 81 mg by mouth daily.    . AZOR 5-40 MG per tablet TAKE 1 TABLET DAILY  . Choline  Fenofibrate (FENOFIBRIC ACID) 135 MG CPDR TAKE (1) CAPSULE DAILY  . esomeprazole (NEXIUM) 40 MG capsule TAKE (1) CAPSULE DAILY  . glimepiride (AMARYL) 4 MG tablet Take 1 tablet (4 mg total) by mouth 2 (two) times daily.  . hydrochlorothiazide (HYDRODIURIL) 25 MG tablet TAKE 1 TABLET DAILY  . latanoprost (XALATAN) 0.005 % ophthalmic solution Place 1 drop into both eyes at bedtime.  . metFORMIN (GLUCOPHAGE-XR) 500 MG 24 hr tablet TAKE 1 TABLET ONCE DAILY WITH BREAKFAST  . metoprolol succinate (TOPROL-XL) 50 MG 24 hr tablet TAKE 1 TABLET ONCE A DAY WITH A MEAL  . nitroGLYCERIN (NITROSTAT) 0.4 MG SL tablet Place 1 tablet (0.4 mg total) under the tongue every 5 (five) minutes as needed.  . rosuvastatin (CRESTOR) 10 MG tablet TAKE 1 TABLET DAILY  . traMADol (ULTRAM) 50 MG tablet Take 1 tablet (50 mg total) by mouth every 6 (six) hours as needed.  . [DISCONTINUED] hydrochlorothiazide (HYDRODIURIL) 25 MG tablet Take 1 tablet (25 mg total) by mouth every morning.   No facility-administered encounter medications on file as of 08/30/2015.     Review of Systems  Constitutional: Negative.   HENT: Negative.   Eyes: Negative.   Respiratory: Negative.   Cardiovascular: Negative.   Gastrointestinal: Negative.   Endocrine: Negative.   Genitourinary: Negative.   Musculoskeletal: Positive for back pain (low back - causing bilateral hip pain).  Skin: Negative.   Allergic/Immunologic: Negative.   Neurological: Negative.  Hematological: Negative.   Psychiatric/Behavioral: Negative.        Objective:   Physical Exam  Constitutional: He is oriented to person, place, and time. He appears well-developed and well-nourished. No distress.  HENT:  Head: Normocephalic and atraumatic.  Right Ear: External ear normal.  Left Ear: External ear normal.  Nose: Nose normal.  Mouth/Throat: Oropharynx is clear and moist. No oropharyngeal exudate.  Eyes: Conjunctivae and EOM are normal. Pupils are equal, round, and  reactive to light. Right eye exhibits no discharge. Left eye exhibits no discharge. No scleral icterus.  Neck: Normal range of motion. Neck supple. No thyromegaly present.  No audible bruits anterior cervical adenopathy or thyromegaly  Cardiovascular: Normal rate, regular rhythm, normal heart sounds and intact distal pulses.   No murmur heard. The heart has a regular rate and rhythm at 72/m  Pulmonary/Chest: Effort normal and breath sounds normal. No respiratory distress. He has no wheezes. He has no rales. He exhibits no tenderness.  Lungs are clear anteriorly and posteriorly and there is no axillary adenopathy  Abdominal: Soft. Bowel sounds are normal. He exhibits no mass. There is no tenderness. There is no rebound and no guarding.  The abdomen is somewhat gaseous but no organ enlargement or masses or bruits  Genitourinary: Rectum normal.  Musculoskeletal: Normal range of motion. He exhibits tenderness. He exhibits no edema.  There is some slight tenderness in the low back. Leg raising and hip abduction do not reproduce the pain that he is having. However when he arose from a sitting position he did feel the pain in his back. This confirms again that is mostly secondary to certain movements.  Lymphadenopathy:    He has no cervical adenopathy.  Neurological: He is alert and oriented to person, place, and time. He has normal reflexes. No cranial nerve deficit.  Skin: Skin is warm and dry. No rash noted.  Psychiatric: He has a normal mood and affect. His behavior is normal. Judgment and thought content normal.  Nursing note and vitals reviewed.  BP 137/71 mmHg  Pulse 67  Temp(Src) 97 F (36.1 C) (Oral)  Ht _0  (1.702 m)  Wt 191 lb (86.637 kg)  BMI 29.91 kg/m2  [facility] reading (PRIMARY) by  Dr Bobbe Medico spine films--    degenerative changes especially L4 and 5 and L5-S1                                    Assessment & Plan:  1. Type 2 diabetes mellitus with diabetic chronic kidney  disease -Blood sugars have been running in the 160s fasting at home and based upon that hemoglobin A1c we'll make determinations about where to go with the diabetes. - BMP8+EGFR - POCT glycosylated hemoglobin (Hb A1C) - CBC with Differential/Platelet  2. CKD (chronic kidney disease) stage 3, GFR 30-59 ml/min -The patient understands to avoid all NSAIDs and keep his blood pressure under as good a control as possible - BMP8+EGFR - CBC with Differential/Platelet  3. Hyperlipidemia LDL goal <70 -His lipids have been difficult to control in the past and he is on maximum dosages of fenofibrate since statins and must be encouraged to continue aggressive therapeutic lifestyle changes which include diet and exercise - CBC with Differential/Platelet - NMR, lipoprofile  4. ASCVD (arteriosclerotic cardiovascular disease) -He just had a visit with the cardiologist and will not see him again for a year and all is stable on  this front. - CBC with Differential/Platelet  5. Essential hypertension, benign -The blood pressure is good today and he will continue to watch his sodium intake and stay as active as possible. - BMP8+EGFR - Hepatic function panel - CBC with Differential/Platelet - NMR, lipoprofile  6. Vitamin D deficiency -Continue current treatment pending results of lab work - CBC with Differential/Platelet - Vit D  25 hydroxy (rtn osteoporosis monitoring)  7. BPH (benign prostatic hyperplasia) -The patient is having no problems with voiding. He will continue with drinking plenty of fluids and recheck his prostate next visit. - CBC with Differential/Platelet  8. Hyperplasia of prostate  9. Type 2 diabetes mellitus with diabetic nephropathy  10. Bilateral low back pain, with sciatica presence unspecified -He will take Tylenol as needed for pain and based on the x-ray results we will make decisions about the next step in controlling his low back pain and hip pain. - DG Lumbar Spine 2-3  Views; Future Meds ordered this encounter  Medications  . amLODipine-olmesartan (AZOR) 5-40 MG per tablet    Sig: Take 1 tablet by mouth daily.    Dispense:  90 tablet    Refill:  3  . Choline Fenofibrate (FENOFIBRIC ACID) 135 MG CPDR    Sig: TAKE (1) CAPSULE DAILY    Dispense:  90 capsule    Refill:  3  . esomeprazole (NEXIUM) 40 MG capsule    Sig: TAKE (1) CAPSULE DAILY    Dispense:  90 capsule    Refill:  3  . glimepiride (AMARYL) 4 MG tablet    Sig: Take 1 tablet (4 mg total) by mouth 2 (two) times daily.    Dispense:  180 tablet    Refill:  3    Note change in dose from qd to bid - discontinue all past prescriptions  . hydrochlorothiazide (HYDRODIURIL) 25 MG tablet    Sig: Take 1 tablet (25 mg total) by mouth daily.    Dispense:  90 tablet    Refill:  3  . metFORMIN (GLUCOPHAGE-XR) 500 MG 24 hr tablet    Sig: TAKE 1 TABLET ONCE DAILY WITH BREAKFAST    Dispense:  90 tablet    Refill:  3  . metoprolol succinate (TOPROL-XL) 50 MG 24 hr tablet    Sig: TAKE 1 TABLET ONCE A DAY WITH A MEAL    Dispense:  90 tablet    Refill:  3  . rosuvastatin (CRESTOR) 10 MG tablet    Sig: Take 1 tablet (10 mg total) by mouth daily.    Dispense:  90 tablet    Refill:  3   Patient Instructions                       Medicare Annual Wellness Visit  [facility] and the medical providers at [facility] strive to bring you the best medical care.  In doing so we not only want to address your current medical conditions and concerns but also to detect new conditions early and prevent illness, disease and health-related problems.    Medicare offers a yearly Wellness Visit which allows our clinical staff to assess your need for preventative services including immunizations, lifestyle education, counseling to decrease risk of preventable diseases and screening for fall risk and other medical concerns.    This visit is provided free of charge (no copay) for all Medicare  recipients. The clinical pharmacists at [facility] have begun to conduct these Wellness  Visits which will also include a thorough review of all your medications.    As you primary medical provider recommend that you make an appointment for your Annual Wellness Visit if you have not done so already this year.  You may set up this appointment before you leave today or you may call back ([phone]) and schedule an appointment.  Please make sure when you call that you mention that you are scheduling your Annual Wellness Visit with the clinical pharmacist so that the appointment may be made for the proper length of time.    Continue current medications. Continue good therapeutic lifestyle changes which include good diet and exercise. Fall precautions discussed with patient. If an FOBT was given today- please return it to our front desk. If you are over 65 years old - you may need Prevnar 41 or the adult Pneumonia vaccine.  **Flu shots will be available soon--- please call and schedule a FLU-CLINIC appointment**  After your visit with [place] today you will receive a survey in the mail or online from [employer] regarding your care with [place]. Please take a moment to fill this out. Your feedback is very important to [place] as you can help [place] better understand your patient needs as well as improve your experience and satisfaction. WE CARE ABOUT YOU!!!   **Please join [place] SEPT.22, 2016 from 5:00 to 7:00pm for our OPEN HOUSE! Come out and meet our NEW providers**  We will call you with the x-ray results as soon as they are returned Continue to be as active physically as possible and watch her diet as closely as possible Return to the clinic in October for your flu shot This winter drink plenty of fluids and keep the house as cool as possible   Arrie Senate MD

## 2015-08-31 ENCOUNTER — Other Ambulatory Visit: Payer: Self-pay | Admitting: Family Medicine

## 2015-08-31 ENCOUNTER — Ambulatory Visit (HOSPITAL_COMMUNITY)
Admission: RE | Admit: 2015-08-31 | Discharge: 2015-08-31 | Disposition: A | Discharge status: Home / Self Care | Payer: Medicare Other | Source: Ambulatory Visit | Attending: Family Medicine | Admitting: Family Medicine

## 2015-08-31 ENCOUNTER — Other Ambulatory Visit: Payer: Self-pay | Admitting: Pharmacist

## 2015-08-31 DIAGNOSIS — M545 Low back pain: Secondary | ICD-10-CM | POA: Insufficient documentation

## 2015-08-31 DIAGNOSIS — M47896 Other spondylosis, lumbar region: Secondary | ICD-10-CM | POA: Insufficient documentation

## 2015-08-31 DIAGNOSIS — M5136 Other intervertebral disc degeneration, lumbar region: Secondary | ICD-10-CM | POA: Insufficient documentation

## 2015-08-31 DIAGNOSIS — M25552 Pain in left hip: Secondary | ICD-10-CM | POA: Insufficient documentation

## 2015-08-31 DIAGNOSIS — M25551 Pain in right hip: Secondary | ICD-10-CM | POA: Diagnosis not present

## 2015-08-31 LAB — CBC WITH DIFFERENTIAL/PLATELET
Basophils Absolute: 0 10*3/uL (ref 0.0–0.2)
Basos: 1 %
EOS (ABSOLUTE): 0.1 10*3/uL (ref 0.0–0.4)
Eos: 2 %
Hematocrit: 40.7 % (ref 37.5–51.0)
Hemoglobin: 13.8 g/dL (ref 12.6–17.7)
Immature Grans (Abs): 0 10*3/uL (ref 0.0–0.1)
Immature Granulocytes: 0 %
Lymphocytes Absolute: 2 10*3/uL (ref 0.7–3.1)
Lymphs: 32 %
MCH: 30.8 pg (ref 26.6–33.0)
MCHC: 33.9 g/dL (ref 31.5–35.7)
MCV: 91 fL (ref 79–97)
Monocytes Absolute: 0.6 10*3/uL (ref 0.1–0.9)
Monocytes: 10 %
Neutrophils Absolute: 3.5 10*3/uL (ref 1.4–7.0)
Neutrophils: 55 %
Platelets: 177 10*3/uL (ref 150–379)
RBC: 4.48 x10E6/uL (ref 4.14–5.80)
RDW: 14.2 % (ref 12.3–15.4)
WBC: 6.3 10*3/uL (ref 3.4–10.8)

## 2015-08-31 LAB — NMR, LIPOPROFILE
Cholesterol: 156 mg/dL (ref 100–199)
HDL Cholesterol by NMR: 14 mg/dL — ABNORMAL LOW (ref >39)
HDL Particle Number: 14.6 umol/L — ABNORMAL LOW (ref >=30.5)
LDL Particle Number: 1488 nmol/L — ABNORMAL HIGH (ref <1000)
LDL Size: 19.4 nm (ref >20.5)
LP-IR Score: 73 — ABNORMAL HIGH (ref <=45)
Small LDL Particle Number: 1318 nmol/L — ABNORMAL HIGH (ref <=527)
Triglycerides by NMR: 519 mg/dL — ABNORMAL HIGH (ref 0–149)

## 2015-08-31 LAB — HEPATIC FUNCTION PANEL
ALT: 31 IU/L (ref 0–44)
AST: 41 IU/L — ABNORMAL HIGH (ref 0–40)
Albumin: 4.4 g/dL (ref 3.5–4.8)
Alkaline Phosphatase: 52 IU/L (ref 39–117)
Bilirubin Total: 0.5 mg/dL (ref 0.0–1.2)
Bilirubin, Direct: 0.15 mg/dL (ref 0.00–0.40)
Total Protein: 7.4 g/dL (ref 6.0–8.5)

## 2015-08-31 LAB — BMP8+EGFR
BUN/Creatinine Ratio: 12 (ref 10–22)
BUN: 19 mg/dL (ref 8–27)
CO2: 23 mmol/L (ref 18–29)
Calcium: 10.3 mg/dL — ABNORMAL HIGH (ref 8.6–10.2)
Chloride: 98 mmol/L (ref 97–108)
Creatinine, Ser: 1.63 mg/dL — ABNORMAL HIGH (ref 0.76–1.27)
GFR calc Af Amer: 47 mL/min/{1.73_m2} — ABNORMAL LOW (ref >59)
GFR calc non Af Amer: 41 mL/min/{1.73_m2} — ABNORMAL LOW (ref >59)
Glucose: 161 mg/dL — ABNORMAL HIGH (ref 65–99)
Potassium: 4.4 mmol/L (ref 3.5–5.2)
Sodium: 139 mmol/L (ref 134–144)

## 2015-08-31 LAB — VITAMIN D 25 HYDROXY (VIT D DEFICIENCY, FRACTURES): Vit D, 25-Hydroxy: 20.7 ng/mL — ABNORMAL LOW (ref 30.0–100.0)

## 2015-08-31 MED ORDER — ICOSAPENT ETHYL 1 G PO CAPS: 2.0000 | ORAL_CAPSULE | Freq: Two times a day (BID) | ORAL | Status: DC

## 2015-09-02 ENCOUNTER — Other Ambulatory Visit: Payer: Medicare Other

## 2015-09-02 DIAGNOSIS — R195 Other fecal abnormalities: Secondary | ICD-10-CM

## 2015-09-02 DIAGNOSIS — Z1212 Encounter for screening for malignant neoplasm of rectum: Secondary | ICD-10-CM

## 2015-09-02 LAB — SPECIMEN STATUS REPORT

## 2015-09-02 LAB — AMYLASE: Amylase: 53 U/L (ref 31–124)

## 2015-09-02 NOTE — Progress Notes (Signed)
Lab only 

## 2015-09-04 LAB — FECAL OCCULT BLOOD, IMMUNOCHEMICAL: Fecal Occult Bld: POSITIVE — AB

## 2015-09-06 NOTE — Addendum Note (Signed)
Addended by: Shelbie Ammons on: 09/06/2015 09:04 AM   Modules accepted: Orders

## 2015-09-15 ENCOUNTER — Encounter: Payer: Self-pay | Admitting: Pharmacist

## 2015-09-15 ENCOUNTER — Ambulatory Visit (INDEPENDENT_AMBULATORY_CARE_PROVIDER_SITE_OTHER): Payer: Medicare Other | Admitting: Pharmacist

## 2015-09-15 DIAGNOSIS — E785 Hyperlipidemia, unspecified: Secondary | ICD-10-CM | POA: Diagnosis not present

## 2015-09-15 DIAGNOSIS — E1122 Type 2 diabetes mellitus with diabetic chronic kidney disease: Secondary | ICD-10-CM | POA: Diagnosis not present

## 2015-09-15 DIAGNOSIS — N189 Chronic kidney disease, unspecified: Secondary | ICD-10-CM | POA: Diagnosis not present

## 2015-09-15 DIAGNOSIS — E781 Pure hyperglyceridemia: Secondary | ICD-10-CM | POA: Diagnosis not present

## 2015-09-15 NOTE — Progress Notes (Signed)
Lipid Clinic Consultation  Chief Complaint:   Chief Complaint  Patient presents with  . Hyperlipidemia     There were no vitals filed for this visit. HPI:     Patient with hyperlipidemia - specifically hypertriglyceridemia and type 2 DM.  Lat A1c was 6.9% but patient states that home BG readings average between 150 to 160.  He is taking glimepiride 57m daily bid and metfromin XR 5040monce daily.  Last eGFR = 41.    About 6 months ago he was taking Kombiglyze and reported better BG readings.  However this was stopped by PCP due to elevated serum creatinine.  Patient is also cautious about the cost of medicattions since he has Medicare.   Patient is taking Crestor 1062m tablet daily for cholesterol and fenofibrate 135m50mce daily and Vascepa 2tabs bid (just started 1 weeks ago) for elevated triglycerides.  Secondary cause of hyperlipidemia present:  Type 2 diabetes Low fat / low CHO diet followed? Most of the time.  Though he reprots that he loves pork, bread and green peas.   Exercise?  Yes - walking a little but hopes to incresae after steroid injection next week for back pain.   last A1C was 6.9% most recently   08/30/2015 02/03/2015 11/30/2014 07/16/2014  TC 156 160 183 126  LDL-P 1488 1061 860 1226  LDL-C NOT ABLE TO CALCULATE 73 UNABLE TO CALCULATE 48  Tg 519 683 751 326    Assessment: hyperlipidemia with history of CAD - Trigycerides have improved over the last 6 months but are still very elevated.  Type 2 DM with IMPROVING a1C Elevated serum creatinine - stable   Recommendations: 1.  Continue Crestor at 10mg47mablet daily.  Continue fenofibrate 135mg 51my.  Continue Vascepa 2 capsules BID.  Recheck lipids in 6 weeks. 2.  Continue glimepiride 4mg 1 31mlet daily and metformin XR 500mg 1 19met daily.  I would like to try tradjenta 5mg but 64m to cost patient does not want to try - will consider if we can get samples.  3.  Discussed diet in depth.  Patient is to limit  high fat and high CHO foods.  Discussed the plate method for meal.  Limit serving sizes of CHO.  Limit high fat meats - beef and pork.  More lean chicken and seafood.  4.  Exercise as able -  Hopefully can increase to 3-4 days/week after steroid injection.  No orders of the defined types were placed in this encounter.    Timothy [name] EckCherre Robins CPP, CDE

## 2015-09-21 ENCOUNTER — Other Ambulatory Visit (INDEPENDENT_AMBULATORY_CARE_PROVIDER_SITE_OTHER): Payer: Medicare Other

## 2015-09-21 DIAGNOSIS — E785 Hyperlipidemia, unspecified: Secondary | ICD-10-CM

## 2015-09-21 DIAGNOSIS — R195 Other fecal abnormalities: Secondary | ICD-10-CM | POA: Diagnosis not present

## 2015-09-21 DIAGNOSIS — E781 Pure hyperglyceridemia: Secondary | ICD-10-CM

## 2015-09-22 ENCOUNTER — Telehealth: Payer: Self-pay

## 2015-09-22 LAB — CBC WITH DIFFERENTIAL/PLATELET
Basophils Absolute: 0 10*3/uL (ref 0.0–0.2)
Basos: 1 %
EOS (ABSOLUTE): 0.1 10*3/uL (ref 0.0–0.4)
Eos: 1 %
Hematocrit: 39.2 % (ref 37.5–51.0)
Hemoglobin: 13.3 g/dL (ref 12.6–17.7)
Immature Grans (Abs): 0 10*3/uL (ref 0.0–0.1)
Immature Granulocytes: 0 %
Lymphocytes Absolute: 2 10*3/uL (ref 0.7–3.1)
Lymphs: 29 %
MCH: 31.5 pg (ref 26.6–33.0)
MCHC: 33.9 g/dL (ref 31.5–35.7)
MCV: 93 fL (ref 79–97)
Monocytes Absolute: 0.5 10*3/uL (ref 0.1–0.9)
Monocytes: 7 %
Neutrophils Absolute: 4.3 10*3/uL (ref 1.4–7.0)
Neutrophils: 62 %
Platelets: 174 10*3/uL (ref 150–379)
RBC: 4.22 x10E6/uL (ref 4.14–5.80)
RDW: 13.4 % (ref 12.3–15.4)
WBC: 6.9 10*3/uL (ref 3.4–10.8)

## 2015-09-22 LAB — LIPID PANEL
Chol/HDL Ratio: 13 ratio units — ABNORMAL HIGH (ref 0.0–5.0)
Cholesterol, Total: 156 mg/dL (ref 100–199)
HDL: 12 mg/dL — ABNORMAL LOW (ref >39)
Triglycerides: 595 mg/dL (ref 0–149)

## 2015-09-22 LAB — LIPASE: Lipase: 89 U/L — ABNORMAL HIGH (ref 0–59)

## 2015-09-22 LAB — AMYLASE: Amylase: 56 U/L (ref 31–124)

## 2015-09-22 LAB — LDL CHOLESTEROL, DIRECT: LDL Direct: 72 mg/dL (ref 0–99)

## 2015-09-23 LAB — FECAL OCCULT BLOOD, IMMUNOCHEMICAL: Fecal Occult Bld: NEGATIVE

## 2015-10-05 ENCOUNTER — Telehealth: Payer: Self-pay

## 2015-10-05 DIAGNOSIS — E782 Mixed hyperlipidemia: Secondary | ICD-10-CM

## 2015-10-05 DIAGNOSIS — R748 Abnormal levels of other serum enzymes: Secondary | ICD-10-CM

## 2015-10-05 NOTE — Telephone Encounter (Signed)
Lm for pt - aware that we are ordering stat GI and CT

## 2015-10-05 NOTE — Telephone Encounter (Signed)
Patient said last time someone called him they told him he needed a CT and a GI appointment?  ( no referrals for these)

## 2015-10-05 NOTE — Telephone Encounter (Signed)
York Cerise, please discuss this with me

## 2015-10-05 NOTE — Addendum Note (Signed)
Addended by: Zannie Cove on: 10/05/2015 03:44 PM   Modules accepted: Orders

## 2015-10-06 ENCOUNTER — Encounter: Payer: Self-pay | Admitting: Gastroenterology

## 2015-10-10 ENCOUNTER — Ambulatory Visit (INDEPENDENT_AMBULATORY_CARE_PROVIDER_SITE_OTHER): Payer: Medicare Other

## 2015-10-10 ENCOUNTER — Other Ambulatory Visit: Payer: Self-pay

## 2015-10-10 DIAGNOSIS — Z23 Encounter for immunization: Secondary | ICD-10-CM | POA: Diagnosis not present

## 2015-10-14 LAB — HM DIABETES EYE EXAM

## 2015-10-17 ENCOUNTER — Encounter: Payer: Self-pay | Admitting: *Deleted

## 2015-10-18 ENCOUNTER — Other Ambulatory Visit (INDEPENDENT_AMBULATORY_CARE_PROVIDER_SITE_OTHER): Payer: Medicare Other

## 2015-10-18 ENCOUNTER — Ambulatory Visit (INDEPENDENT_AMBULATORY_CARE_PROVIDER_SITE_OTHER): Payer: Medicare Other | Admitting: Gastroenterology

## 2015-10-18 ENCOUNTER — Encounter: Payer: Self-pay | Admitting: Gastroenterology

## 2015-10-18 VITALS — BP 130/70 | HR 72 | Ht 67.5 in | Wt 193.4 lb

## 2015-10-18 DIAGNOSIS — R748 Abnormal levels of other serum enzymes: Secondary | ICD-10-CM

## 2015-10-18 DIAGNOSIS — E782 Mixed hyperlipidemia: Secondary | ICD-10-CM

## 2015-10-18 LAB — LIPASE: Lipase: 68 U/L — ABNORMAL HIGH (ref 11.0–59.0)

## 2015-10-18 NOTE — Patient Instructions (Signed)
Your physician has requested that you go to the basement for the following lab work before leaving today: Lipase

## 2015-10-18 NOTE — Progress Notes (Signed)
10/18/2015 Timothy [name] [id number] 1941-10-04  HISTORY OF PRESENT ILLNESS:  This is a pleasant 74 year old male who is new to our practice and has been referred here by his PCP, Dr. Laurance [name], for evaluation regarding slightly elevated lipase.  Patient was previously a patient at [facility] and had colonoscopies over the years by Dr. Watt [name] (last one was 2 or so years ago).  We will try to obtain those records for future reference if patient is going to continue to come here for his care.  Regarding his elevated lipase, on 9/14 the patient had an MR of the lumbar spine that showed potentially some trace edema along the upper margin of the pancreas.  Lipase and amylase levels were checked and lipase was slightly elevate at 89, but amylase was normal.  CT scan of the abdomen was ordered by PCP, but was declined by [facility] so was cancelled.  Patient denies any complaints including abdominal pain, weight loss, etc.  He also has very high triglycerides, which his PCP is trying to control; most recent level was 595.   Past Medical History  Diagnosis Date  . Essential hypertension, benign   . Hyperplasia of prostate   . 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 Colon polyp   . Coronary artery disease     (left main normal, LAD 25-30% stenosis, distal 30-40% stenosis, circumflex obtuse marginal 50% stenosis,  right coronary artery dominant with long 75% followed by mid 80%  stenosis followed by 9% stenosis at the ostium of the PDA.  He had  stenting of the PDA by Dr. Lia [name].  This was a Cypher stent.   First angioplasty was 1994, Cath 2011 100% RCA ).      . Cataract   . Glaucoma   . Chronic kidney disease   . Diabetes mellitus without complication [facility])    Past Surgical History  Procedure Laterality Date  . Bilateral hernia repair    . Angioplasty with 3 stents    . Androplasty    . Cholestectomy    . Appendectomy    . Cervical spine surgery    . Eye surgery  Bilateral     cataracts  . Cholecystectomy      reports that he quit smoking about 24 years ago. His smoking use included Cigarettes. He has a 48 pack-year smoking history. He has never used smokeless tobacco. He reports that he does not drink alcohol or use illicit drugs. family history includes Cancer in his mother; Cancer (age of onset: 55) in his sister; Colon cancer (age of onset: 78) in his brother; Coronary artery disease in his father; Glaucoma in his mother; Heart disease in his father; Nephritis in his sister. Allergies  Allergen Reactions  . Hydrocodone-Acetaminophen Other (See Comments)    Makes pt. Feel loopy      Outpatient Encounter Prescriptions as of 10/18/2015  Medication Sig  . amLODipine-olmesartan (AZOR) 5-40 MG per tablet Take 1 tablet by mouth daily.  Marland Kitchen aspirin 81 MG EC tablet Take 81 mg by mouth daily.    . Choline Fenofibrate (FENOFIBRIC ACID) 135 MG CPDR TAKE (1) CAPSULE DAILY  . esomeprazole (NEXIUM) 40 MG capsule TAKE (1) CAPSULE DAILY  . glimepiride (AMARYL) 4 MG tablet Take 1 tablet (4 mg total) by mouth 2 (two) times daily.  . hydrochlorothiazide (HYDRODIURIL) 25 MG tablet Take 1 tablet (25 mg total) by mouth daily.  Timothy [name] Ethyl (VASCEPA) 1 G  CAPS Take 2 capsules by mouth 2 (two) times daily.  Marland Kitchen latanoprost (XALATAN) 0.005 % ophthalmic solution Place 1 drop into both eyes at bedtime.  . metFORMIN (GLUCOPHAGE-XR) 500 MG 24 hr tablet TAKE 1 TABLET ONCE DAILY WITH BREAKFAST  . metoprolol succinate (TOPROL-XL) 50 MG 24 hr tablet TAKE 1 TABLET ONCE A DAY WITH A MEAL  . nitroGLYCERIN (NITROSTAT) 0.4 MG SL tablet Place 1 tablet (0.4 mg total) under the tongue every 5 (five) minutes as needed.  . Probiotic Product (PROBIOTIC PEARLS ADVANTAGE PO) Take by mouth daily.  . rosuvastatin (CRESTOR) 10 MG tablet Take 1 tablet (10 mg total) by mouth daily.  . traMADol (ULTRAM) 50 MG tablet Take 1 tablet (50 mg total) by mouth every 6 (six) hours as needed.   No  facility-administered encounter medications on file as of 10/18/2015.     REVIEW OF SYSTEMS  : All other systems reviewed and negative except where noted in the History of Present Illness.   PHYSICAL EXAM: BP 130/70 mmHg  Pulse 72  Ht 5' 7.5" (1.715 m)  Wt 193 lb 6 oz (87.714 kg)  BMI 29.82 kg/m2 General: Well developed white male in no acute distress Head: Normocephalic and atraumatic Eyes:  Sclerae anicteric, conjunctiva pink. Ears: Normal auditory acuity Lungs: Clear throughout to auscultation Heart: Regular rate and rhythm Abdomen: Soft, non-distended.  Normal bowel sounds.  Non-tender. Musculoskeletal: Symmetrical with no gross deformities  Skin: No lesions on visible extremities Extremities: No edema  Neurological: Alert oriented x 4, grossly non-focal Psychological:  Alert and cooperative. Normal mood and affect  ASSESSMENT AND PLAN: -Elevated lipase:  Slightly elevated at 89.  MRI spine with possible trace edema near pancreas.  Has very elevated triglycerides, which his PCP is trying to control.  Patient is asymptomatic.  Will start by repeating lipase level before any further evaluation.  *Patient to transfer care to our facility.  Will obtain previous colonoscopy records from [facility].   CC:  Timothy Herb, MD

## 2015-10-19 NOTE — Progress Notes (Signed)
Quick Note:  Explain to patient it is better but given the elvation and ? Abnormality on the spine MR the best way to exclude a problem would be MR pancreas/MRCP if he wants to do that - ______

## 2015-10-21 ENCOUNTER — Other Ambulatory Visit: Payer: Self-pay | Admitting: *Deleted

## 2015-10-21 DIAGNOSIS — R748 Abnormal levels of other serum enzymes: Secondary | ICD-10-CM

## 2015-10-26 NOTE — Progress Notes (Signed)
Agree with Ms. Zehr's management.  Carl E. Gessner, MD, FACG  

## 2015-11-01 ENCOUNTER — Ambulatory Visit (HOSPITAL_COMMUNITY)
Admission: RE | Admit: 2015-11-01 | Discharge: 2015-11-01 | Disposition: A | Discharge status: Home / Self Care | Payer: Medicare Other | Source: Ambulatory Visit | Attending: Physician Assistant | Admitting: Physician Assistant

## 2015-11-01 ENCOUNTER — Other Ambulatory Visit: Payer: Self-pay | Admitting: Physician Assistant

## 2015-11-01 DIAGNOSIS — K760 Fatty (change of) liver, not elsewhere classified: Secondary | ICD-10-CM | POA: Diagnosis not present

## 2015-11-01 DIAGNOSIS — R748 Abnormal levels of other serum enzymes: Secondary | ICD-10-CM

## 2015-11-01 DIAGNOSIS — Z9049 Acquired absence of other specified parts of digestive tract: Secondary | ICD-10-CM | POA: Diagnosis not present

## 2015-11-01 LAB — POCT I-STAT CREATININE: Creatinine, Ser: 2.1 mg/dL — ABNORMAL HIGH (ref 0.61–1.24)

## 2015-11-01 MED ORDER — GADOBENATE DIMEGLUMINE 529 MG/ML IV SOLN
10.0000 mL | Freq: Once | INTRAVENOUS | Status: AC | PRN
  Administered 2015-11-01: 9 mL via INTRAVENOUS

## 2015-11-03 ENCOUNTER — Telehealth: Payer: Self-pay | Admitting: Family Medicine

## 2015-11-03 NOTE — Telephone Encounter (Signed)
Please call patient with results and let him know the reason that we not seen the report was because we didn't order it. It must been ordered by the gastroenterologist office. Give him the full text of the report and a copy of the report for his records

## 2015-11-03 NOTE — Telephone Encounter (Signed)
I see an MRI report in the chart but e didn't order it. Please advise, the pt is wanting the results.

## 2015-11-07 NOTE — Telephone Encounter (Signed)
Stp and reviewed. Pt states he will call GI doctor for further instructions.

## 2015-11-08 ENCOUNTER — Telehealth: Payer: Self-pay | Admitting: Gastroenterology

## 2015-11-08 NOTE — Telephone Encounter (Signed)
Advised patient his MRI has not been reviewed by his provider yet. There are no signs of any tumor or abnormality, but he will be contacted with the recommendations as soon as it is reviewed.

## 2015-11-16 ENCOUNTER — Other Ambulatory Visit: Payer: Self-pay | Admitting: Dermatology

## 2015-11-16 DIAGNOSIS — C4492 Squamous cell carcinoma of skin, unspecified: Secondary | ICD-10-CM

## 2015-11-16 HISTORY — DX: Squamous cell carcinoma of skin, unspecified: C44.92

## 2015-12-07 ENCOUNTER — Other Ambulatory Visit: Payer: Self-pay | Admitting: Dermatology

## 2015-12-22 ENCOUNTER — Encounter: Payer: Self-pay | Admitting: Family Medicine

## 2015-12-22 ENCOUNTER — Ambulatory Visit (INDEPENDENT_AMBULATORY_CARE_PROVIDER_SITE_OTHER): Payer: Medicare HMO | Admitting: Family Medicine

## 2015-12-22 VITALS — BP 139/77 | HR 73 | Temp 97.5°F | Ht 67.0 in | Wt 193.0 lb

## 2015-12-22 DIAGNOSIS — E559 Vitamin D deficiency, unspecified: Secondary | ICD-10-CM | POA: Diagnosis not present

## 2015-12-22 DIAGNOSIS — N400 Enlarged prostate without lower urinary tract symptoms: Secondary | ICD-10-CM | POA: Diagnosis not present

## 2015-12-22 DIAGNOSIS — I2510 Atherosclerotic heart disease of native coronary artery without angina pectoris: Secondary | ICD-10-CM

## 2015-12-22 DIAGNOSIS — E1122 Type 2 diabetes mellitus with diabetic chronic kidney disease: Secondary | ICD-10-CM | POA: Diagnosis not present

## 2015-12-22 DIAGNOSIS — E785 Hyperlipidemia, unspecified: Secondary | ICD-10-CM | POA: Diagnosis not present

## 2015-12-22 DIAGNOSIS — N183 Chronic kidney disease, stage 3 unspecified: Secondary | ICD-10-CM

## 2015-12-22 DIAGNOSIS — I10 Essential (primary) hypertension: Secondary | ICD-10-CM

## 2015-12-22 DIAGNOSIS — R638 Other symptoms and signs concerning food and fluid intake: Secondary | ICD-10-CM

## 2015-12-22 LAB — POCT UA - MICROSCOPIC ONLY
Bacteria, U Microscopic: NEGATIVE
Casts, Ur, LPF, POC: NEGATIVE
Crystals, Ur, HPF, POC: NEGATIVE
Mucus, UA: NEGATIVE
RBC, urine, microscopic: NEGATIVE
Yeast, UA: NEGATIVE

## 2015-12-22 LAB — POCT URINALYSIS DIPSTICK
Bilirubin, UA: NEGATIVE
Blood, UA: NEGATIVE
Glucose, UA: NEGATIVE
Ketones, UA: NEGATIVE
Leukocytes, UA: NEGATIVE
Nitrite, UA: NEGATIVE
Spec Grav, UA: 1.025
Urobilinogen, UA: NEGATIVE
pH, UA: 5

## 2015-12-22 NOTE — Patient Instructions (Addendum)
Medicare Annual Wellness Visit  [facility] and the medical providers at [facility] strive to bring you the best medical care.  In doing so we not only want to address your current medical conditions and concerns but also to detect new conditions early and prevent illness, disease and health-related problems.    Medicare offers a yearly Wellness Visit which allows our clinical staff to assess your need for preventative services including immunizations, lifestyle education, counseling to decrease risk of preventable diseases and screening for fall risk and other medical concerns.    This visit is provided free of charge (no copay) for all Medicare recipients. The clinical pharmacists at [facility] have begun to conduct these Wellness Visits which will also include a thorough review of all your medications.    As you primary medical provider recommend that you make an appointment for your Annual Wellness Visit if you have not done so already this year.  You may set up this appointment before you leave today or you may call back [phone]) and schedule an appointment.  Please make sure when you call that you mention that you are scheduling your Annual Wellness Visit with the clinical pharmacist so that the appointment may be made for the proper length of time.     Continue current medications. Continue good therapeutic lifestyle changes which include good diet and exercise. Fall precautions discussed with patient. If an FOBT was given today- please return it to our front desk. If you are over 75 years old - you may need Prevnar 53 or the adult Pneumonia vaccine.  **Flu shots are available--- please call and schedule a FLU-CLINIC appointment**  After your visit with [place] today you will receive a survey in the mail or online from [employer] regarding your care with [place]. Please take a moment to fill this out. Your feedback is very  important to [place] as you can help [place] better understand your patient needs as well as improve your experience and satisfaction. WE CARE ABOUT YOU!!!   Come fasting for labs anytime between 1/9 and 1/23. This winter, continue to drink plenty of fluids, stay well hydrated, keep the house as cool as possible Consider Weight Watchers or some group therapy to help with weight loss as this is very important for your overall health Continue current medications continue to check blood sugars regularly and check feet regularly Continue to be careful do not put yourself at risk for falling

## 2015-12-22 NOTE — Progress Notes (Signed)
Subjective:    Patient ID: Timothy [name], male    DOB: 03/04/1941, 75 y.o.   MRN: [id number]  HPI Pt here for follow up and management of chronic medical problems which includes hypertension, hyperlipidemia, and diabetes. He is taking medications regularly. The patient is doing well overall. The concerns about the abdomen were ligated with a negative MRI and everything has been fine and the patient is continued with the workup. He does know that he needs to lose some weight. His blood sugars at home have been running in the 140-150 range fasting. We discussed Weight Watchers. He promises he will work on the weight more aggressively. He denies chest pain shortness of breath trouble swallowing and heartburn and indigestion nausea vomiting diarrhea or blood in the stool. His passing his water without problems. He does take Nexium and this helps with the indigestion and reflux. 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 He is up-to-date on his eye exams.      Patient Active Problem List   Diagnosis Date Noted  . Abnormal serum lipase level 10/18/2015  . Elevated triglycerides with high cholesterol 10/18/2015  . CKD (chronic kidney disease) stage 3, GFR 30-59 ml/min 05/13/2015  . Glaucoma 12/27/2014  . Overweight(278.02) 06/27/2011  . ASCVD (arteriosclerotic cardiovascular disease)   . Essential hypertension, benign   . Hyperplasia of prostate   . 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 Colon polyp   . Type 2 diabetes mellitus with diabetic nephropathy ([place]) 05/24/2010  . Hyperlipidemia LDL goal <70 05/13/2009  . Coronary atherosclerosis 05/13/2009   Outpatient Encounter Prescriptions as of 12/22/2015  Medication Sig  . amLODipine-olmesartan (AZOR) 5-40 MG per tablet Take 1 tablet by mouth daily.  Marland Kitchen aspirin 81 MG EC tablet Take 81 mg by mouth daily.    . Choline Fenofibrate (FENOFIBRIC ACID) 135 MG  CPDR TAKE (1) CAPSULE DAILY  . esomeprazole (NEXIUM) 40 MG capsule TAKE (1) CAPSULE DAILY  . glimepiride (AMARYL) 4 MG tablet Take 1 tablet (4 mg total) by mouth 2 (two) times daily.  . hydrochlorothiazide (HYDRODIURIL) 25 MG tablet Take 1 tablet (25 mg total) by mouth daily.  [name] (VASCEPA) 1 G CAPS Take 2 capsules by mouth 2 (two) times daily.  Marland Kitchen latanoprost (XALATAN) 0.005 % ophthalmic solution Place 1 drop into both eyes at bedtime.  . metFORMIN (GLUCOPHAGE-XR) 500 MG 24 hr tablet TAKE 1 TABLET ONCE DAILY WITH BREAKFAST  . metoprolol succinate (TOPROL-XL) 50 MG 24 hr tablet TAKE 1 TABLET ONCE A DAY WITH A MEAL  . nitroGLYCERIN (NITROSTAT) 0.4 MG SL tablet Place 1 tablet (0.4 mg total) under the tongue every 5 (five) minutes as needed.  . Probiotic Product (PROBIOTIC PEARLS ADVANTAGE PO) Take by mouth daily.  . rosuvastatin (CRESTOR) 10 MG tablet Take 1 tablet (10 mg total) by mouth daily.  . traMADol (ULTRAM) 50 MG tablet Take 1 tablet (50 mg total) by mouth every 6 (six) hours as needed.   No facility-administered encounter medications on file as of 12/22/2015.      Review of Systems  Constitutional: Negative.   HENT: Negative.   Eyes: Negative.   Respiratory: Negative.   Cardiovascular: Negative.   Gastrointestinal: Negative.   Endocrine: Negative.   Genitourinary: Negative.   Musculoskeletal: Negative.   Skin: Negative.   Allergic/Immunologic: Negative.   Neurological: Negative.   Hematological: Negative.   Psychiatric/Behavioral:  Negative.        Objective:   Physical Exam  Constitutional: He is oriented to person, place, and time. He appears well-developed and well-nourished.  HENT:  Head: Normocephalic and atraumatic.  Right Ear: External ear normal.  Left Ear: External ear normal.  Nose: Nose normal.  Mouth/Throat: Oropharynx is clear and moist. No oropharyngeal exudate.  Eyes: Conjunctivae and EOM are normal. Pupils are equal, round, and reactive to  light. Right eye exhibits no discharge. Left eye exhibits no discharge. No scleral icterus.  Neck: Normal range of motion. Neck supple. No thyromegaly present.  No carotid bruits anterior cervical adenopathy or thyromegaly  Cardiovascular: Normal rate, regular rhythm, normal heart sounds and intact distal pulses.   No murmur heard. The heart has a regular rate and rhythm at 72/m  Pulmonary/Chest: Effort normal and breath sounds normal. No respiratory distress. He has no wheezes. He has no rales. He exhibits no tenderness.  No axillary adenopathy   Abdominal: Soft. Bowel sounds are normal. He exhibits no mass. There is no tenderness. There is no rebound and no guarding.  Obesity without masses tenderness or organ enlargement or bruits  Genitourinary: Rectum normal and penis normal.  The prostate is slightly enlarged without lumps or masses. There were no rectal masses. The external genitalia were within normal limits and no inguinal hernias or inguinal adenopathy were palpated.  Musculoskeletal: Normal range of motion. He exhibits no edema or tenderness.  Lymphadenopathy:    He has no cervical adenopathy.  Neurological: He is alert and oriented to person, place, and time. He has normal reflexes. No cranial nerve deficit.  Skin: Skin is warm and dry. No rash noted.  Psychiatric: He has a normal mood and affect. His behavior is normal. Judgment and thought content normal.  Nursing note and vitals reviewed.   BP 139/77 mmHg  Pulse 73  Temp(Src) 97.5 F (36.4 C) (Oral)  Ht '5\' 7"'  (1.702 m)  Wt 193 lb (87.544 kg)  BMI 30.22 kg/m2       Assessment & Plan:  1. Hyperlipidemia LDL goal <70 -Return to clinic for fasting lab work and continue current therapy pending results of labs - CBC with Differential/Platelet; Future - NMR, lipoprofile; Future  2. CKD (chronic kidney disease) stage 3, GFR 30-59 ml/min -Continue to keep blood pressure under good control watch sodium intake and lose  weight - BMP8+EGFR; Future - CBC with Differential/Platelet; Future  3. Type 2 diabetes mellitus with stage 3 chronic kidney disease, without long-term current use of insulin (HCC) -Continue to monitor blood sugars closely with an effort to keep the hemoglobin A1c is low as possible - POCT glycosylated hemoglobin (Hb A1C); Future - BMP8+EGFR; Future - CBC with Differential/Platelet; Future  4. ASCVD (arteriosclerotic cardiovascular disease) -Follow-up with cardiology as needed - CBC with Differential/Platelet; Future  5. Vitamin D deficiency -Continue current vitamin D replacement pending results of lab work - CBC with Differential/Platelet; Future - VITAMIN D 25 Hydroxy (Vit-D Deficiency, Fractures); Future  6. Essential hypertension, benign -Continue blood pressure medicines as doing and watch sodium intake - BMP8+EGFR; Future - CBC with Differential/Platelet; Future - Hepatic function panel; Future  7. BPH (benign prostatic hyperplasia) -PSA today and no symptoms of BPH - POCT urinalysis dipstick - POCT UA - Microscopic Only - CBC with Differential/Platelet; Future - PSA, total and free; Future  8. Increased BMI -Patient will make all efforts at working on weight and getting more exercise  Patient Instructions  Medicare Annual Wellness Visit  [facility] and the medical providers at [facility] strive to bring you the best medical care.  In doing so we not only want to address your current medical conditions and concerns but also to detect new conditions early and prevent illness, disease and health-related problems.    Medicare offers a yearly Wellness Visit which allows our clinical staff to assess your need for preventative services including immunizations, lifestyle education, counseling to decrease risk of preventable diseases and screening for fall risk and other medical concerns.    This visit is provided free of charge  (no copay) for all Medicare recipients. The clinical pharmacists at [facility] have begun to conduct these Wellness Visits which will also include a thorough review of all your medications.    As you primary medical provider recommend that you make an appointment for your Annual Wellness Visit if you have not done so already this year.  You may set up this appointment before you leave today or you may call back [phone]) and schedule an appointment.  Please make sure when you call that you mention that you are scheduling your Annual Wellness Visit with the clinical pharmacist so that the appointment may be made for the proper length of time.     Continue current medications. Continue good therapeutic lifestyle changes which include good diet and exercise. Fall precautions discussed with patient. If an FOBT was given today- please return it to our front desk. If you are over 94 years old - you may need Prevnar 57 or the adult Pneumonia vaccine.  **Flu shots are available--- please call and schedule a FLU-CLINIC appointment**  After your visit with [place] today you will receive a survey in the mail or online from [employer] regarding your care with [place]. Please take a moment to fill this out. Your feedback is very important to [place] as you can help [place] better understand your patient needs as well as improve your experience and satisfaction. WE CARE ABOUT YOU!!!   Come fasting for labs anytime between 1/9 and 1/23. This winter, continue to drink plenty of fluids, stay well hydrated, keep the house as cool as possible Consider Weight Watchers or some group therapy to help with weight loss as this is very important for your overall health Continue current medications continue to check blood sugars regularly and check feet regularly Continue to be careful do not put yourself at risk for falling   Arrie Senate MD

## 2015-12-23 ENCOUNTER — Other Ambulatory Visit: Payer: Self-pay | Admitting: Pharmacist

## 2015-12-28 ENCOUNTER — Other Ambulatory Visit (INDEPENDENT_AMBULATORY_CARE_PROVIDER_SITE_OTHER): Payer: Medicare HMO

## 2015-12-28 ENCOUNTER — Telehealth: Payer: Self-pay | Admitting: *Deleted

## 2015-12-28 DIAGNOSIS — N183 Chronic kidney disease, stage 3 unspecified: Secondary | ICD-10-CM

## 2015-12-28 DIAGNOSIS — E559 Vitamin D deficiency, unspecified: Secondary | ICD-10-CM

## 2015-12-28 DIAGNOSIS — E785 Hyperlipidemia, unspecified: Secondary | ICD-10-CM

## 2015-12-28 DIAGNOSIS — I10 Essential (primary) hypertension: Secondary | ICD-10-CM | POA: Diagnosis not present

## 2015-12-28 DIAGNOSIS — I2510 Atherosclerotic heart disease of native coronary artery without angina pectoris: Secondary | ICD-10-CM | POA: Diagnosis not present

## 2015-12-28 DIAGNOSIS — N400 Enlarged prostate without lower urinary tract symptoms: Secondary | ICD-10-CM

## 2015-12-28 DIAGNOSIS — E1122 Type 2 diabetes mellitus with diabetic chronic kidney disease: Secondary | ICD-10-CM | POA: Diagnosis not present

## 2015-12-28 LAB — POCT GLYCOSYLATED HEMOGLOBIN (HGB A1C): Hemoglobin A1C: 6.9

## 2015-12-28 NOTE — Progress Notes (Signed)
Lab only 

## 2015-12-28 NOTE — Telephone Encounter (Signed)
Left a message for patient to call back. Recall in EPIC.

## 2015-12-28 NOTE — Telephone Encounter (Signed)
-----   Message from Loralie Champagne, PA-C sent at 12/27/2015  5:04 PM EST ----- Please let the patient know that we finally his colonoscopy records from [place]. His last colonoscopy was in September 2013 at which time the study was normal, but due to his family history of colon cancer in his brother it was recommended that he have another colonoscopy in 5 years from that time. Please put him in colonoscopy recall with his primary GI (Dr. Carlean Purl, I believe) for 08/2017.

## 2015-12-29 LAB — CBC WITH DIFFERENTIAL/PLATELET
Basophils Absolute: 0.1 10*3/uL (ref 0.0–0.2)
Basos: 1 %
EOS (ABSOLUTE): 0.1 10*3/uL (ref 0.0–0.4)
Eos: 2 %
Hematocrit: 40.3 % (ref 37.5–51.0)
Hemoglobin: 13.5 g/dL (ref 12.6–17.7)
Immature Grans (Abs): 0 10*3/uL (ref 0.0–0.1)
Immature Granulocytes: 0 %
Lymphocytes Absolute: 2.2 10*3/uL (ref 0.7–3.1)
Lymphs: 34 %
MCH: 30.2 pg (ref 26.6–33.0)
MCHC: 33.5 g/dL (ref 31.5–35.7)
MCV: 90 fL (ref 79–97)
Monocytes Absolute: 0.6 10*3/uL (ref 0.1–0.9)
Monocytes: 10 %
Neutrophils Absolute: 3.5 10*3/uL (ref 1.4–7.0)
Neutrophils: 53 %
Platelets: 187 10*3/uL (ref 150–379)
RBC: 4.47 x10E6/uL (ref 4.14–5.80)
RDW: 14.2 % (ref 12.3–15.4)
WBC: 6.4 10*3/uL (ref 3.4–10.8)

## 2015-12-29 LAB — NMR, LIPOPROFILE
Cholesterol: 165 mg/dL (ref 100–199)
HDL Cholesterol by NMR: 16 mg/dL — ABNORMAL LOW (ref >39)
HDL Particle Number: 19.3 umol/L — ABNORMAL LOW (ref >=30.5)
LDL Particle Number: 1608 nmol/L — ABNORMAL HIGH (ref <1000)
LDL Size: 19.5 nm (ref >20.5)
LP-IR Score: 70 — ABNORMAL HIGH (ref <=45)
Small LDL Particle Number: 1427 nmol/L — ABNORMAL HIGH (ref <=527)
Triglycerides by NMR: 544 mg/dL — ABNORMAL HIGH (ref 0–149)

## 2015-12-29 LAB — BMP8+EGFR
BUN/Creatinine Ratio: 13 (ref 10–22)
BUN: 23 mg/dL (ref 8–27)
CO2: 21 mmol/L (ref 18–29)
Calcium: 9.7 mg/dL (ref 8.6–10.2)
Chloride: 97 mmol/L (ref 96–106)
Creatinine, Ser: 1.83 mg/dL — ABNORMAL HIGH (ref 0.76–1.27)
GFR calc Af Amer: 41 mL/min/{1.73_m2} — ABNORMAL LOW (ref >59)
GFR calc non Af Amer: 36 mL/min/{1.73_m2} — ABNORMAL LOW (ref >59)
Glucose: 172 mg/dL — ABNORMAL HIGH (ref 65–99)
Potassium: 4.2 mmol/L (ref 3.5–5.2)
Sodium: 137 mmol/L (ref 134–144)

## 2015-12-29 LAB — PSA, TOTAL AND FREE
PSA, Free Pct: 75 %
PSA, Free: 0.15 ng/mL
Prostate Specific Ag, Serum: 0.2 ng/mL (ref 0.0–4.0)

## 2015-12-29 LAB — VITAMIN D 25 HYDROXY (VIT D DEFICIENCY, FRACTURES): Vit D, 25-Hydroxy: 17.1 ng/mL — ABNORMAL LOW (ref 30.0–100.0)

## 2015-12-29 LAB — HEPATIC FUNCTION PANEL
ALT: 28 IU/L (ref 0–44)
AST: 29 IU/L (ref 0–40)
Albumin: 4.5 g/dL (ref 3.5–4.8)
Alkaline Phosphatase: 59 IU/L (ref 39–117)
Bilirubin Total: 0.3 mg/dL (ref 0.0–1.2)
Bilirubin, Direct: 0.13 mg/dL (ref 0.00–0.40)
Total Protein: 7.2 g/dL (ref 6.0–8.5)

## 2015-12-29 NOTE — Telephone Encounter (Signed)
Patient notified of recommendations. 

## 2016-01-12 ENCOUNTER — Encounter: Payer: Medicare HMO | Admitting: Pharmacist

## 2016-01-13 ENCOUNTER — Encounter: Payer: Self-pay | Admitting: Pharmacist

## 2016-01-13 ENCOUNTER — Ambulatory Visit (INDEPENDENT_AMBULATORY_CARE_PROVIDER_SITE_OTHER): Payer: Medicare HMO | Admitting: Pharmacist

## 2016-01-13 ENCOUNTER — Encounter: Payer: Self-pay | Admitting: Family Medicine

## 2016-01-13 VITALS — BP 138/75 | HR 77 | Ht 67.0 in | Wt 194.0 lb

## 2016-01-13 DIAGNOSIS — N183 Chronic kidney disease, stage 3 unspecified: Secondary | ICD-10-CM

## 2016-01-13 DIAGNOSIS — E1122 Type 2 diabetes mellitus with diabetic chronic kidney disease: Secondary | ICD-10-CM

## 2016-01-13 DIAGNOSIS — E782 Mixed hyperlipidemia: Secondary | ICD-10-CM | POA: Diagnosis not present

## 2016-01-13 MED ORDER — EZETIMIBE 10 MG PO TABS: 10.0000 mg | ORAL_TABLET | Freq: Every day | ORAL | Status: DC

## 2016-01-13 NOTE — Progress Notes (Signed)
Lipid Clinic Consultation  Chief Complaint:   Chief Complaint  Patient presents with  . Hyperlipidemia    low HDL, elevated Tg     Filed Vitals:   01/13/16 1445  BP: 138/75  Pulse: 77   Body mass index is 30.38 kg/(m^2).  HPI:     Patient with hyperlipidemia - specifically hypertriglyceridemia and type 2 DM.  Lat A1c was 6.9%.  He is taking glimepiride 57m daily bid and metfromin XR 5052monce daily.  Last eGFR = 36.     Patient is taking Crestor 1038m tablet daily for cholesterol and fenofibrate 135m38mce daily and Vascepa 2 tabs bid  for elevated triglycerides.  Secondary cause of hyperlipidemia present:  Type 2 diabetes Low fat / low CHO diet followed? Sometimes. "I am 74yo and I don't want to give up all the foods I love"   Exercise?  No  last A1C was 6.9%   12/28/2015 08/30/2015 02/03/2015 11/30/2014 07/16/2014  TC 165 156 160 183 126  LDL-P 1608 1488 1061 860 1226  LDL-C Not able to calculate NOT ABLE TO CALCULATE 73 UNABLE TO CALCULATE 48  Tg [phone] 326    Assessment: hyperlipidemia with history of CAD - Trigycerides elevated despite triple therapy   Type 2 DM with stable a1C and at goal Elevated serum creatinine - stable   Recommendations: 1.   Add Zetia 10mg73me 1 tablet daily Continue Crestor at 10mg 33mblet daily.  Continue fenofibrate 135mg d66m.  Continue Vascepa 2 capsules BID.  2.  Continue glimepiride 4mg 1 t76met daily and metformin XR 500mg 1 t71mt daily.  I would like to try tradjenta 5mg but d37mto cost patient does not want to try - will consider if we can get samples.  3.  Discussed diet.  Patient is to limit high fat and high CHO foods.  Discussed the plate method for meal.  Limit serving sizes of CHO.  Limit high fat meats - beef and pork.  More lean chicken and seafood.  4.  Recheck lipids in 4 to 6 weeks.   No orders of the defined types were placed in this encounter.    Guled Gahan EckaCherre RobinsCPP, CDE

## 2016-02-17 ENCOUNTER — Other Ambulatory Visit: Payer: Medicare HMO

## 2016-02-17 DIAGNOSIS — E782 Mixed hyperlipidemia: Secondary | ICD-10-CM

## 2016-02-18 LAB — LDL CHOLESTEROL, DIRECT: LDL Direct: 60 mg/dL (ref 0–99)

## 2016-02-18 LAB — LIPID PANEL
Chol/HDL Ratio: 9.9 ratio — ABNORMAL HIGH (ref 0.0–5.0)
Cholesterol, Total: 129 mg/dL (ref 100–199)
HDL: 13 mg/dL — ABNORMAL LOW
Triglycerides: 462 mg/dL — ABNORMAL HIGH (ref 0–149)

## 2016-02-22 ENCOUNTER — Other Ambulatory Visit: Payer: Self-pay | Admitting: Pharmacist

## 2016-02-22 MED ORDER — EZETIMIBE 10 MG PO TABS: 10.0000 mg | ORAL_TABLET | Freq: Every day | ORAL | Status: DC

## 2016-05-09 ENCOUNTER — Ambulatory Visit (INDEPENDENT_AMBULATORY_CARE_PROVIDER_SITE_OTHER): Payer: Medicare HMO | Admitting: Family Medicine

## 2016-05-09 ENCOUNTER — Encounter: Payer: Self-pay | Admitting: Family Medicine

## 2016-05-09 ENCOUNTER — Ambulatory Visit (INDEPENDENT_AMBULATORY_CARE_PROVIDER_SITE_OTHER): Payer: Medicare HMO

## 2016-05-09 VITALS — BP 105/55 | HR 76 | Temp 97.7°F | Ht 67.0 in | Wt 193.0 lb

## 2016-05-09 DIAGNOSIS — I10 Essential (primary) hypertension: Secondary | ICD-10-CM | POA: Diagnosis not present

## 2016-05-09 DIAGNOSIS — R748 Abnormal levels of other serum enzymes: Secondary | ICD-10-CM

## 2016-05-09 DIAGNOSIS — I2510 Atherosclerotic heart disease of native coronary artery without angina pectoris: Secondary | ICD-10-CM | POA: Diagnosis not present

## 2016-05-09 DIAGNOSIS — N183 Chronic kidney disease, stage 3 unspecified: Secondary | ICD-10-CM

## 2016-05-09 DIAGNOSIS — G4710 Hypersomnia, unspecified: Secondary | ICD-10-CM | POA: Diagnosis not present

## 2016-05-09 DIAGNOSIS — E782 Mixed hyperlipidemia: Secondary | ICD-10-CM | POA: Diagnosis not present

## 2016-05-09 DIAGNOSIS — E559 Vitamin D deficiency, unspecified: Secondary | ICD-10-CM

## 2016-05-09 DIAGNOSIS — G4700 Insomnia, unspecified: Secondary | ICD-10-CM

## 2016-05-09 DIAGNOSIS — E1122 Type 2 diabetes mellitus with diabetic chronic kidney disease: Secondary | ICD-10-CM

## 2016-05-09 DIAGNOSIS — E785 Hyperlipidemia, unspecified: Secondary | ICD-10-CM | POA: Diagnosis not present

## 2016-05-09 DIAGNOSIS — E1121 Type 2 diabetes mellitus with diabetic nephropathy: Secondary | ICD-10-CM | POA: Diagnosis not present

## 2016-05-09 DIAGNOSIS — R400 Somnolence: Secondary | ICD-10-CM

## 2016-05-09 LAB — BAYER DCA HB A1C WAIVED: HB A1C (BAYER DCA - WAIVED): 7.6 % — ABNORMAL HIGH (ref <7.0)

## 2016-05-09 NOTE — Patient Instructions (Addendum)
Medicare Annual Wellness Visit  [facility] and the medical providers at [facility] strive to bring you the best medical care.  In doing so we not only want to address your current medical conditions and concerns but also to detect new conditions early and prevent illness, disease and health-related problems.    Medicare offers a yearly Wellness Visit which allows our clinical staff to assess your need for preventative services including immunizations, lifestyle education, counseling to decrease risk of preventable diseases and screening for fall risk and other medical concerns.    This visit is provided free of charge (no copay) for all Medicare recipients. The clinical pharmacists at [facility] have begun to conduct these Wellness Visits which will also include a thorough review of all your medications.    As you primary medical provider recommend that you make an appointment for your Annual Wellness Visit if you have not done so already this year.  You may set up this appointment before you leave today or you may call back [phone] and schedule an appointment.  Please make sure when you call that you mention that you are scheduling your Annual Wellness Visit with the clinical pharmacist so that the appointment may be made for the proper length of time.     Continue current medications. Continue good therapeutic lifestyle changes which include good diet and exercise. Fall precautions discussed with patient. If an FOBT was given today- please return it to our front desk. If you are over 60 years old - you may need Prevnar 31 or the adult Pneumonia vaccine.  **Flu shots are available--- please call and schedule a FLU-CLINIC appointment**  After your visit with [place] today you will receive a survey in the mail or online from [employer] regarding your care with [place]. Please take a moment to fill this out. Your feedback is very  important to [place] as you can help [place] better understand your patient needs as well as improve your experience and satisfaction. WE CARE ABOUT YOU!!!   We will arrange for you to have a sleep study with the pulmonologist in [place] In the future we will ask you to try Zantac or ranitidine instead of generic Nexium. But for the time being stay with the Nexium Continue to watch the diet closely and exercise as much as possible Continue with current medicines We will call you with your lab work results as soon as they become available Keep appointments with ophthalmology and cardiology

## 2016-05-09 NOTE — Addendum Note (Signed)
Addended by: Zannie Cove on: 05/09/2016 03:57 PM   Modules accepted: Orders

## 2016-05-09 NOTE — Progress Notes (Signed)
Subjective:    Patient ID: Timothy [name], male    DOB: 03/16/1941, 75 y.o.   MRN: [id number]  HPI Pt here for follow up and management of chronic medical problems which includes hypertension, hyperlipidemia, and diabetes. He is taking medications regularly.The biggest complaint today is the patient has sleep issues that he wants to sleep all day because he can't sleep at nighttime. He is due to get a chest x-ray and lab work today. The patient has definite has some sleep issues. This is been going on for several years. He is had the daytime somnolence for 2-3 years. He says as long as he is busy he will go to sleep but if he stops and sits down he gets sleepy during the day. There is no history of snoring from his spouse for him. The patient denies chest pain chest tightness shortness of breath trouble swallowing heartburn indigestion nausea vomiting diarrhea or blood in the stool. He does take Nexium for his reflux and this controls his acid condition. He has no trouble passing his water with burning pain or frequency. His blood sugars fasting have been running around 155. He gets his eye exams yearly late in the year.   Patient Active Problem List   Diagnosis Date Noted  . Abnormal serum lipase level 10/18/2015  . Elevated triglycerides with high cholesterol 10/18/2015  . CKD (chronic kidney disease) stage 3, GFR 30-59 ml/min 05/13/2015  . Glaucoma 12/27/2014  . Overweight(278.02) 06/27/2011  . ASCVD (arteriosclerotic cardiovascular disease)   . Essential hypertension, benign   . Hyperplasia of prostate   . 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 Colon polyp   . Type 2 diabetes mellitus with diabetic nephropathy ([place]) 05/24/2010  . Hyperlipidemia LDL goal <70 05/13/2009  . Coronary atherosclerosis 05/13/2009   Outpatient Encounter Prescriptions as of 05/09/2016  Medication Sig  . amLODipine-olmesartan (AZOR) 5-40 MG per tablet Take 1 tablet by mouth daily.  Marland Kitchen  aspirin 81 MG EC tablet Take 81 mg by mouth daily.    . Choline Fenofibrate (FENOFIBRIC ACID) 135 MG CPDR TAKE (1) CAPSULE DAILY  . esomeprazole (NEXIUM) 40 MG capsule TAKE (1) CAPSULE DAILY  . glimepiride (AMARYL) 4 MG tablet Take 1 tablet (4 mg total) by mouth 2 (two) times daily.  . hydrochlorothiazide (HYDRODIURIL) 25 MG tablet Take 1 tablet (25 mg total) by mouth daily.  Marland Kitchen latanoprost (XALATAN) 0.005 % ophthalmic solution Place 1 drop into both eyes at bedtime.  . metFORMIN (GLUCOPHAGE-XR) 500 MG 24 hr tablet TAKE 1 TABLET ONCE DAILY WITH BREAKFAST  . metoprolol succinate (TOPROL-XL) 50 MG 24 hr tablet TAKE 1 TABLET ONCE A DAY WITH A MEAL  . rosuvastatin (CRESTOR) 10 MG tablet Take 1 tablet (10 mg total) by mouth daily.  Marland Kitchen VASCEPA 1 g CAPS TAKE (2) CAPSULES TWICE DAILY.  Marland Kitchen ezetimibe (ZETIA) 10 MG tablet Take 1 tablet (10 mg total) by mouth daily. (Patient not taking: Reported on 05/09/2016)  . nitroGLYCERIN (NITROSTAT) 0.4 MG SL tablet Place 1 tablet (0.4 mg total) under the tongue every 5 (five) minutes as needed. (Patient not taking: Reported on 05/09/2016)  . [DISCONTINUED] Probiotic Product (PROBIOTIC PEARLS ADVANTAGE PO) Take by mouth daily. Reported on 05/09/2016  . [DISCONTINUED] traMADol (ULTRAM) 50 MG tablet Take 1 tablet (50 mg total) by mouth every 6 (six) hours as needed. (Patient not taking: Reported on 05/09/2016)   No facility-administered encounter medications on file as of 05/09/2016.  Review of Systems  Constitutional: Negative.        Trouble sleeping at night - sleeps all day  HENT: Negative.   Eyes: Negative.   Respiratory: Negative.   Cardiovascular: Negative.   Gastrointestinal: Negative.   Endocrine: Negative.   Genitourinary: Negative.   Musculoskeletal: Negative.   Skin: Negative.   Allergic/Immunologic: Negative.   Neurological: Negative.   Hematological: Negative.   Psychiatric/Behavioral: Negative.        Objective:   Physical Exam    Constitutional: He is oriented to person, place, and time. He appears well-developed and well-nourished. No distress.  HENT:  Head: Normocephalic and atraumatic.  Right Ear: External ear normal.  Left Ear: External ear normal.  Nose: Nose normal.  Mouth/Throat: Oropharynx is clear and moist. No oropharyngeal exudate.  Eyes: Conjunctivae and EOM are normal. Pupils are equal, round, and reactive to light. Right eye exhibits no discharge. Left eye exhibits no discharge. No scleral icterus.  Neck: Normal range of motion. Neck supple. No thyromegaly present.  No bruits thyromegaly or anterior cervical adenopathy  Cardiovascular: Normal rate, regular rhythm, normal heart sounds and intact distal pulses.   No murmur heard. The heart has a regular rate and rhythm at 60/m  Pulmonary/Chest: Effort normal and breath sounds normal. No respiratory distress. He has no wheezes. He has no rales. He exhibits no tenderness.  Clear anteriorly and posteriorly  Abdominal: Soft. Bowel sounds are normal. He exhibits no mass. There is no tenderness. There is no rebound and no guarding.  No liver or spleen enlargement and no epigastric tenderness and no masses palpable.  Musculoskeletal: Normal range of motion. He exhibits no edema or tenderness.  Lymphadenopathy:    He has no cervical adenopathy.  Neurological: He is alert and oriented to person, place, and time. He has normal reflexes. No cranial nerve deficit.  Skin: Skin is warm and dry. No rash noted.  Psychiatric: He has a normal mood and affect. His behavior is normal. Judgment and thought content normal.  Nursing note and vitals reviewed.  BP 105/55 mmHg  Pulse 76  Temp(Src) 97.7 F (36.5 C) (Oral)  Ht '5\' 7"'  (1.702 m)  Wt 193 lb (87.544 kg)  BMI 30.22 kg/m2  [facility] reading (PRIMARY) by  Dr.Aanya Haynes-chest x-ray with results pending                                       Assessment & Plan:  1. Type 2 diabetes mellitus with stage 3 chronic kidney  disease, without long-term current use of insulin (HCC) -Continue with current treatment and with as aggressive therapeutic lifestyle changes as possible which include diet and exercise pending results of lab work - BMP8+EGFR - CBC with Differential/Platelet - Bayer DCA Hb A1c Waived  2. Hyperlipidemia LDL goal <70 -Continue with current treatment pending results of lab work - [employer]; Future - CBC with Differential/Platelet - NMR, lipoprofile  3. CKD (chronic kidney disease) stage 3, GFR 30-59 ml/min -Continue with good blood pressure control good blood sugar control and avoid sodium intake - BMP8+EGFR - CBC with Differential/Platelet  4. Elevated triglycerides with high cholesterol -Continue with current treatment pending results of lab work - CBC with Differential/Platelet  5. ASCVD (arteriosclerotic cardiovascular disease) -Continue follow-up with cardiology - CBC with Differential/Platelet  6. Essential hypertension, benign -Blood pressure is good today and continue with current treatment and sodium restriction -  DG Chest 2 View; Future - BMP8+EGFR - CBC with Differential/Platelet - Hepatic function panel  7. Vitamin D deficiency -Continue with current treatment pending results of lab work - CBC with Differential/Platelet - VITAMIN D 25 Hydroxy (Vit-D Deficiency, Fractures)  8. Abnormal serum lipase level -The visit to the gastroenterologist ultimately had a negative workup.  9. Type 2 diabetes mellitus with diabetic nephropathy, unspecified long term insulin use status ([place]) -Continue with aggressive therapeutic lifestyle changes and Amaryl and metformin  10. Insomnia -Sleep study with pulmonology. If this is negative we will consider trying Belsomra  11. Daytime somnolence -Sleep study with pulmonology  No orders of the defined types were placed in this encounter.   Patient Instructions                       Medicare Annual Wellness Visit  [facility] and the medical providers at [facility] strive to bring you the best medical care.  In doing so we not only want to address your current medical conditions and concerns but also to detect new conditions early and prevent illness, disease and health-related problems.    Medicare offers a yearly Wellness Visit which allows our clinical staff to assess your need for preventative services including immunizations, lifestyle education, counseling to decrease risk of preventable diseases and screening for fall risk and other medical concerns.    This visit is provided free of charge (no copay) for all Medicare recipients. The clinical pharmacists at [facility] have begun to conduct these Wellness Visits which will also include a thorough review of all your medications.    As you primary medical provider recommend that you make an appointment for your Annual Wellness Visit if you have not done so already this year.  You may set up this appointment before you leave today or you may call back ([phone]) and schedule an appointment.  Please make sure when you call that you mention that you are scheduling your Annual Wellness Visit with the clinical pharmacist so that the appointment may be made for the proper length of time.     Continue current medications. Continue good therapeutic lifestyle changes which include good diet and exercise. Fall precautions discussed with patient. If an FOBT was given today- please return it to our front desk. If you are over 1 years old - you may need Prevnar 20 or the adult Pneumonia vaccine.  **Flu shots are available--- please call and schedule a FLU-CLINIC appointment**  After your visit with [place] today you will receive a survey in the mail or online from [employer] regarding your care with [place]. Please take a moment to fill this out. Your feedback is very important to [place] as you can help [place] better understand your patient  needs as well as improve your experience and satisfaction. WE CARE ABOUT YOU!!!   We will arrange for you to have a sleep study with the pulmonologist in [place] In the future we will ask you to try Zantac or ranitidine instead of generic Nexium. But for the time being stay with the Nexium Continue to watch the diet closely and exercise as much as possible Continue with current medicines We will call you with your lab work results as soon as they become available Keep appointments with ophthalmology and cardiology   Arrie Senate MD

## 2016-05-10 ENCOUNTER — Telehealth: Payer: Self-pay | Admitting: Family Medicine

## 2016-05-10 LAB — HEPATIC FUNCTION PANEL
ALT: 31 IU/L (ref 0–44)
AST: 32 IU/L (ref 0–40)
Albumin: 4.4 g/dL (ref 3.5–4.8)
Alkaline Phosphatase: 50 IU/L (ref 39–117)
Bilirubin Total: 0.3 mg/dL (ref 0.0–1.2)
Bilirubin, Direct: 0.13 mg/dL (ref 0.00–0.40)
Total Protein: 7.1 g/dL (ref 6.0–8.5)

## 2016-05-10 LAB — BMP8+EGFR
BUN/Creatinine Ratio: 12 (ref 10–24)
BUN: 20 mg/dL (ref 8–27)
CO2: 20 mmol/L (ref 18–29)
Calcium: 9.2 mg/dL (ref 8.6–10.2)
Chloride: 97 mmol/L (ref 96–106)
Creatinine, Ser: 1.61 mg/dL — ABNORMAL HIGH (ref 0.76–1.27)
GFR calc Af Amer: 48 mL/min/{1.73_m2} — ABNORMAL LOW (ref >59)
GFR calc non Af Amer: 42 mL/min/{1.73_m2} — ABNORMAL LOW (ref >59)
Glucose: 178 mg/dL — ABNORMAL HIGH (ref 65–99)
Potassium: 4.4 mmol/L (ref 3.5–5.2)
Sodium: 138 mmol/L (ref 134–144)

## 2016-05-10 LAB — CBC WITH DIFFERENTIAL/PLATELET
Basophils Absolute: 0 10*3/uL (ref 0.0–0.2)
Basos: 1 %
EOS (ABSOLUTE): 0.1 10*3/uL (ref 0.0–0.4)
Eos: 2 %
Hematocrit: 40.2 % (ref 37.5–51.0)
Hemoglobin: 13.1 g/dL (ref 12.6–17.7)
Immature Grans (Abs): 0 10*3/uL (ref 0.0–0.1)
Immature Granulocytes: 0 %
Lymphocytes Absolute: 1.9 10*3/uL (ref 0.7–3.1)
Lymphs: 36 %
MCH: 29.4 pg (ref 26.6–33.0)
MCHC: 32.6 g/dL (ref 31.5–35.7)
MCV: 90 fL (ref 79–97)
Monocytes Absolute: 0.4 10*3/uL (ref 0.1–0.9)
Monocytes: 7 %
Neutrophils Absolute: 2.9 10*3/uL (ref 1.4–7.0)
Neutrophils: 54 %
Platelets: 153 10*3/uL (ref 150–379)
RBC: 4.45 x10E6/uL (ref 4.14–5.80)
RDW: 13.9 % (ref 12.3–15.4)
WBC: 5.3 10*3/uL (ref 3.4–10.8)

## 2016-05-10 LAB — VITAMIN D 25 HYDROXY (VIT D DEFICIENCY, FRACTURES): Vit D, 25-Hydroxy: 20.8 ng/mL — ABNORMAL LOW (ref 30.0–100.0)

## 2016-05-10 LAB — NMR, LIPOPROFILE
Cholesterol: 159 mg/dL (ref 100–199)
HDL Cholesterol by NMR: 13 mg/dL — ABNORMAL LOW (ref >39)
HDL Particle Number: 17.2 umol/L — ABNORMAL LOW (ref >=30.5)
LDL Particle Number: 1192 nmol/L — ABNORMAL HIGH (ref <1000)
LDL Size: 19.6 nm (ref >20.5)
LP-IR Score: 65 — ABNORMAL HIGH (ref <=45)
Small LDL Particle Number: 1039 nmol/L — ABNORMAL HIGH (ref <=527)
Triglycerides by NMR: 520 mg/dL — ABNORMAL HIGH (ref 0–149)

## 2016-05-10 NOTE — Telephone Encounter (Signed)
Patient aware of lab results and appt made to see Tammy

## 2016-05-16 ENCOUNTER — Ambulatory Visit (INDEPENDENT_AMBULATORY_CARE_PROVIDER_SITE_OTHER): Payer: Medicare HMO | Admitting: Pharmacist

## 2016-05-16 ENCOUNTER — Encounter: Payer: Self-pay | Admitting: Pharmacist

## 2016-05-16 VITALS — BP 110/60 | HR 70 | Ht 67.0 in | Wt 193.0 lb

## 2016-05-16 DIAGNOSIS — E785 Hyperlipidemia, unspecified: Secondary | ICD-10-CM | POA: Diagnosis not present

## 2016-05-16 DIAGNOSIS — N183 Chronic kidney disease, stage 3 unspecified: Secondary | ICD-10-CM

## 2016-05-16 DIAGNOSIS — E1122 Type 2 diabetes mellitus with diabetic chronic kidney disease: Secondary | ICD-10-CM | POA: Diagnosis not present

## 2016-05-16 MED ORDER — SITAGLIPTIN PHOSPHATE 50 MG PO TABS: 50.0000 mg | ORAL_TABLET | Freq: Every day | ORAL | Status: DC

## 2016-05-16 NOTE — Progress Notes (Signed)
Lipid Clinic Consultation  Chief Complaint:   Chief Complaint  Patient presents with  . Hyperlipidemia  . Diabetes     Filed Vitals:   05/16/16 0833  BP: 110/60  Pulse: 70   Body mass index is 30.22 kg/(m^2).  HPI:     Patient with hypertriglyceridemia and type 2 DM.  Lat A1c was 7.6%.  He is taking glimepiride 40m daily bid and metfromin XR 5038monce daily.  Last eGFR = 42.     Patient is taking Crestor 1079m tablet daily for cholesterol and fenofibrate 135m38mce daily and Vascepa 2 tabs bid  for elevated triglycerides.  Secondary cause of hyperlipidemia present:  Type 2 diabetes Low fat / low CHO diet followed? Sometimes.  Admits that his weakness if bread.   Exercise?  No but continues to work daily in job where he is fairly physical   05/09/2016 02/17/2016 12/28/2015 08/30/2015 02/03/2015 11/30/2014  TC 159 129 165 156 160 183  LDL-P 1192 -- 1608 1488 1061 860  LDL-C unable to calculate 60 Not able to calculate NOT ABLE TO CALCULATE 73 UNABLE TO CALCULATE  Tg 520 462 544 [phone]    Assessment: hyperlipidemia with history of CAD - Trigycerides elevated despite triple therapy   Type 2 DM - recent increase in A1c Elevated serum creatinine / CKD stage 3 - stable  Recommendations: 1.   Add Januvia 50mg34make 1 tablet daily (patient given #28 of 100mg 37mtold to take 1/2 tablet and rx for 50mg 143mlet daily) Continue Crestor at 10mg 1 62met daily.  Continue fenofibrate 135mg dai9m Continue Vascepa 2 capsules BID.  2.  Continue glimepiride 4mg 1 tab20m daily and metformin XR 500mg 1 tab72mdaily (will hold at current dose due to renal function) 3.  Discussed diet.  Patient is to limit high fat and high CHO foods.  Reviewed serving sizes of CHO.  Limit high fat meats - beef and pork.  More lean chicken and seafood. Discussed ways to decrease bread intake and alternatives to substitute 4.  Discussed increase exercise - goal is 150 minutes of physical activity  daily 5.  RTC in 3 month to check A1c and do AWV. Will also check urine albumin at that time as patient is unable to void today.  No orders of the defined types were placed in this encounter.    Opha Mcghee EckarCherre RobinsPP, CDE

## 2016-07-24 ENCOUNTER — Encounter: Payer: Self-pay | Admitting: *Deleted

## 2016-07-25 NOTE — Progress Notes (Signed)
HPI The patient presents for one year followup. Since I last saw him he has been doing well.  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  The patient denies any new symptoms such as chest discomfort, neck or arm discomfort. There has been no new shortness of breath, PND or orthopnea. There have been no reported palpitations, presyncope or syncope.     Allergies  Allergen Reactions  . Hydrocodone-Acetaminophen Other (See Comments)    Makes pt. Feel loopy    Current Outpatient Prescriptions  Medication Sig Dispense Refill  . amLODipine-olmesartan (AZOR) 5-40 MG per tablet Take 1 tablet by mouth daily. 90 tablet 3  . aspirin 81 MG EC tablet Take 81 mg by mouth daily.      . Choline Fenofibrate (FENOFIBRIC ACID) 135 MG CPDR TAKE (1) CAPSULE DAILY 90 capsule 3  . esomeprazole (NEXIUM) 40 MG capsule TAKE (1) CAPSULE DAILY 90 capsule 3  . glimepiride (AMARYL) 4 MG tablet Take 1 tablet (4 mg total) by mouth 2 (two) times daily. 180 tablet 3  . hydrochlorothiazide (HYDRODIURIL) 25 MG tablet Take 1 tablet (25 mg total) by mouth daily. 90 tablet 3  . latanoprost (XALATAN) 0.005 % ophthalmic solution Place 1 drop into both eyes at bedtime.    . metFORMIN (GLUCOPHAGE-XR) 500 MG 24 hr tablet TAKE 1 TABLET ONCE DAILY WITH BREAKFAST 90 tablet 3  . metoprolol succinate (TOPROL-XL) 50 MG 24 hr tablet TAKE 1 TABLET ONCE A DAY WITH A MEAL 90 tablet 3  . nitroGLYCERIN (NITROSTAT) 0.4 MG SL tablet Place 1 tablet (0.4 mg total) under the tongue every 5 (five) minutes as needed. 25 tablet 3  . rosuvastatin (CRESTOR) 10 MG tablet Take 1 tablet (10 mg total) by mouth daily. 90 tablet 3  . sitaGLIPtin (JANUVIA) 50 MG tablet Take 1 tablet (50 mg total) by mouth daily. 30 tablet 1  . VASCEPA 1 g CAPS TAKE (2) CAPSULES TWICE DAILY. 120 capsule 5   No current facility-administered medications for this visit.     Past Medical History:  Diagnosis Date  .  Cataract   . Chronic kidney disease   . Colon polyp   . Coronary artery disease    (left main normal, LAD 25-30% stenosis, distal 30-40% stenosis, circumflex obtuse marginal 50% stenosis,  right coronary artery dominant with long 75% followed by mid 80%  stenosis followed by 9% stenosis at the ostium of the PDA.  He had  stenting of the PDA by Dr. [name].  This was a Cypher stent.   First angioplasty was 1994, Cath 2011 100% RCA ).      . Diabetes mellitus without complication ([place])   . Essential hypertension, benign   . Glaucoma   . Hyperplasia of prostate   . 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 androplasty    . angioplasty with 3 stents    . APPENDECTOMY    . bilateral hernia repair    . CERVICAL SPINE SURGERY    . CHOLECYSTECTOMY    . cholestectomy    . EYE SURGERY Bilateral    cataracts    ROS:  As stated in the HPI and negative for all other systems.  PHYSICAL EXAM BP 112/67   Pulse 70   Ht 5' 7.5" (1.715 m)   Wt 192 lb (87.1 kg)  BMI 29.63 kg/m  GENERAL:  Well appearing NECK:  No jugular venous distention, waveform within normal limits, carotid upstroke brisk and symmetric, no bruits, no thyromegaly LUNGS:  Clear to auscultation bilaterally BACK:  No CVA tenderness CHEST:  Unremarkable HEART:  PMI not displaced or sustained,S1 and S2 within normal limits, no S3, no S4, no clicks, no rubs, no murmurs ABD:  Flat, positive bowel sounds normal in frequency in pitch, no bruits, no rebound, no guarding, no midline pulsatile mass, no hepatomegaly, no splenomegaly EXT:  2 plus pulses throughout, no edema, no cyanosis no clubbing  Lab Results  Component Value Date   HGBA1C 6.9 12/28/2015    EKG: Sinus rhythm, rate 70, axis within normal limits, intervals within normal limits, nonspecific ST-T wave changes, early transition in lead V2 , no change from previous  07/26/2016   ASSESSMENT AND  PLAN  CAD -  He's not having any of the symptoms that was his previous angina.  He will continue with risk reduction.   Essential hypertension, benign -  The blood pressure is at target. No change in medications is indicated. We will continue with therapeutic lifestyle changes (TLC).   DYSLIPIDEMIA -  I will defer to .Dr. Tawanna Sat expertise.  I did review.  His LDL was 60 with and HDL of only 13 and trig of 462.

## 2016-07-26 ENCOUNTER — Ambulatory Visit (INDEPENDENT_AMBULATORY_CARE_PROVIDER_SITE_OTHER): Payer: Medicare HMO | Admitting: Cardiology

## 2016-07-26 ENCOUNTER — Encounter: Payer: Self-pay | Admitting: Cardiology

## 2016-07-26 VITALS — BP 112/67 | HR 70 | Ht 67.5 in | Wt 192.0 lb

## 2016-07-26 DIAGNOSIS — I2510 Atherosclerotic heart disease of native coronary artery without angina pectoris: Secondary | ICD-10-CM

## 2016-07-26 DIAGNOSIS — I10 Essential (primary) hypertension: Secondary | ICD-10-CM

## 2016-07-26 NOTE — Patient Instructions (Signed)
Medication Instructions:  Continue all current medications.  Labwork: None            Testing/Procedures: None.   Follow-Up: Your physician wants you to follow up in:  1 year.  You will receive a reminder letter in the mail one-two months in advance.  If you don't receive a letter, please call our office to schedule the follow up appointment.  - Timothy [name]   Any Other Special Instructions Will Be Listed Below (If Applicable).  If you need a refill on your cardiac medications before your next appointment, please call your pharmacy.

## 2016-07-27 ENCOUNTER — Encounter: Payer: Self-pay | Admitting: Pulmonary Disease

## 2016-07-27 ENCOUNTER — Ambulatory Visit (INDEPENDENT_AMBULATORY_CARE_PROVIDER_SITE_OTHER): Payer: Medicare HMO | Admitting: Pulmonary Disease

## 2016-07-27 VITALS — BP 128/78 | HR 81 | Ht 67.5 in | Wt 193.0 lb

## 2016-07-27 DIAGNOSIS — G4710 Hypersomnia, unspecified: Secondary | ICD-10-CM

## 2016-07-27 NOTE — Progress Notes (Signed)
Past Surgical History He  has a past surgical history that includes bilateral hernia repair; angioplasty with 3 stents; androplasty; cholestectomy; Appendectomy; Cervical spine surgery; Eye surgery (Bilateral); and Cholecystectomy.  Allergies  Allergen Reactions  . Hydrocodone-Acetaminophen Other (See Comments)    Makes pt. Feel loopy    Family History His family history includes Cancer in his mother; Cancer (age of onset: 59) in his sister; Colon cancer (age of onset: 58) in his brother; Coronary artery disease in his father; Glaucoma in his mother; Heart disease in his father; Nephritis in his sister.  Social History He  reports that he quit smoking about 24 years ago. His smoking use included Cigarettes. He started smoking about 59 years ago. He has a 50.00 pack-year smoking history. He has never used smokeless tobacco. He reports that he drinks alcohol. He reports that he does not use drugs.  Review of systems Constitutional: Negative for fever and unexpected weight change.  HENT: Positive for sneezing. Negative for congestion, dental problem, ear pain, nosebleeds, postnasal drip, rhinorrhea, sinus pressure, sore throat and trouble swallowing.   Eyes: Negative for redness and itching.  Respiratory: Negative for cough, chest tightness, shortness of breath and wheezing.   Cardiovascular: Negative for palpitations and leg swelling.  Gastrointestinal: Negative for nausea and vomiting.  Genitourinary: Negative for dysuria.  Musculoskeletal: Negative for joint swelling.  Skin: Negative for rash.  Neurological: Negative for headaches.  Hematological: Does not bruise/bleed easily.  Psychiatric/Behavioral: Negative for dysphoric mood. The patient is not nervous/anxious.     Current Outpatient Prescriptions on File Prior to Visit  Medication Sig  . amLODipine-olmesartan (AZOR) 5-40 MG per tablet Take 1 tablet by mouth daily.  Marland Kitchen aspirin 81 MG EC tablet Take 81 mg by mouth daily.    .  Choline Fenofibrate (FENOFIBRIC ACID) 135 MG CPDR TAKE (1) CAPSULE DAILY  . esomeprazole (NEXIUM) 40 MG capsule TAKE (1) CAPSULE DAILY  . glimepiride (AMARYL) 4 MG tablet Take 1 tablet (4 mg total) by mouth 2 (two) times daily.  . hydrochlorothiazide (HYDRODIURIL) 25 MG tablet Take 1 tablet (25 mg total) by mouth daily.  Marland Kitchen latanoprost (XALATAN) 0.005 % ophthalmic solution Place 1 drop into both eyes at bedtime.  . metFORMIN (GLUCOPHAGE-XR) 500 MG 24 hr tablet TAKE 1 TABLET ONCE DAILY WITH BREAKFAST  . metoprolol succinate (TOPROL-XL) 50 MG 24 hr tablet TAKE 1 TABLET ONCE A DAY WITH A MEAL  . nitroGLYCERIN (NITROSTAT) 0.4 MG SL tablet Place 1 tablet (0.4 mg total) under the tongue every 5 (five) minutes as needed.  . rosuvastatin (CRESTOR) 10 MG tablet Take 1 tablet (10 mg total) by mouth daily.  . sitaGLIPtin (JANUVIA) 50 MG tablet Take 1 tablet (50 mg total) by mouth daily.  Marland Kitchen VASCEPA 1 g CAPS TAKE (2) CAPSULES TWICE DAILY.   No current facility-administered medications on file prior to visit.     Chief Complaint  Patient presents with  . Sleep Consult    Referred by Dr Laurance Flatten. 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  Vital signs BP 128/78 (BP Location: Left Arm, Cuff Size: Normal)   Pulse 81   Ht 5' 7.5" (1.715 m)   Wt 193 lb (87.5 kg)   SpO2 97%   BMI 29.78  kg/m   History of Present Illness Timothy [name] is a 75 y.o. male for evaluation of sleep problems.  He was seen recently by his PCP.  He has been concerned about feeling sleepy during the day, but has trouble staying asleep at night.  He does snore some.  He can fall asleep whenever sitting quiet, but is okay as long as he stays  active.  He goes to sleep at 11 pm.  He falls asleep after 10 minutes.  He wakes up 2 or 3 times to use the bathroom.  He gets out of bed at 5 am.  He feels tired in the morning.  He denies morning headache.  He does not use anything to help him fall sleep.  He drinks 4 to 6 cups of coffee in the morning.  He denies sleep walking, sleep talking, bruxism, or nightmares.  There is no history of restless legs.  He denies sleep hallucinations, sleep paralysis, or cataplexy.  The Epworth score is 20 out of 24.   Physical Exam:  General - No distress ENT - No sinus tenderness, no oral exudate, no LAN, no thyromegaly, TM clear, pupils equal/reactive, MP 3, wears dentures, triangular uvula Cardiac - s1s2 regular, no murmur, pulses symmetric Chest - No wheeze/rales/dullness, good air entry, normal respiratory excursion Back - No focal tenderness Abd - Soft, non-tender, no organomegaly, + bowel sounds Ext - No edema Neuro - Normal strength, cranial nerves intact Skin - No rashes Psych - Normal mood, and behavior  Discussion: He has snoring, sleep disruption, and daytime sleepiness.  He has hx of CAD, HTN on multiple medications, and DM.  I am concerned he could have sleep apnea.  We discussed how sleep apnea can affect various health problems, including risks for hypertension, cardiovascular disease, and diabetes.  We also discussed how sleep disruption can increase risks for accidents, such as while driving.  Weight loss as a means of improving sleep apnea was also reviewed.  Additional treatment options discussed were CPAP therapy, oral appliance, and surgical intervention.  Assessment/plan:  Snoring with concern for obstructive sleep apnea. - will arrange for home sleep study pending insurance approval   Patient Instructions  Will arrange for home sleep study Will call to arrange for follow up after sleep study reviewed     Chesley Mires, M.D. Pager [phone] 07/27/2016, 1:53  PM

## 2016-07-27 NOTE — Progress Notes (Signed)
   Subjective:    Patient ID: Timothy [name], male    DOB: 24-Jan-1941, 75 y.o.   MRN: [id number]  HPI    Review of Systems  Constitutional: Negative for fever and unexpected weight change.  HENT: Positive for sneezing. Negative for congestion, dental problem, ear pain, nosebleeds, postnasal drip, rhinorrhea, sinus pressure, sore throat and trouble swallowing.   Eyes: Negative for redness and itching.  Respiratory: Negative for cough, chest tightness, shortness of breath and wheezing.   Cardiovascular: Negative for palpitations and leg swelling.  Gastrointestinal: Negative for nausea and vomiting.  Genitourinary: Negative for dysuria.  Musculoskeletal: Negative for joint swelling.  Skin: Negative for rash.  Neurological: Negative for headaches.  Hematological: Does not bruise/bleed easily.  Psychiatric/Behavioral: Negative for dysphoric mood. The patient is not nervous/anxious.        Objective:   Physical Exam        Assessment & Plan:

## 2016-07-27 NOTE — Patient Instructions (Signed)
Will arrange for home sleep study Will call to arrange for follow up after sleep study reviewed  

## 2016-08-15 ENCOUNTER — Encounter: Payer: Self-pay | Admitting: Pharmacist

## 2016-08-15 ENCOUNTER — Ambulatory Visit (INDEPENDENT_AMBULATORY_CARE_PROVIDER_SITE_OTHER): Payer: Medicare HMO | Admitting: Pharmacist

## 2016-08-15 VITALS — BP 114/70 | HR 68 | Ht 67.0 in | Wt 191.0 lb

## 2016-08-15 DIAGNOSIS — N183 Chronic kidney disease, stage 3 unspecified: Secondary | ICD-10-CM

## 2016-08-15 DIAGNOSIS — E1122 Type 2 diabetes mellitus with diabetic chronic kidney disease: Secondary | ICD-10-CM | POA: Diagnosis not present

## 2016-08-15 DIAGNOSIS — Z0000 Encounter for general adult medical examination without abnormal findings: Secondary | ICD-10-CM

## 2016-08-15 LAB — BAYER DCA HB A1C WAIVED: HB A1C (BAYER DCA - WAIVED): 6.2 % (ref <7.0)

## 2016-08-15 MED ORDER — SITAGLIP PHOS-METFORMIN HCL ER 50-500 MG PO TB24: 1.0000 | ORAL_TABLET | Freq: Every day | ORAL | 1 refills | Status: DC

## 2016-08-15 NOTE — Patient Instructions (Addendum)
   A1c was 6.2% today - down from 7.6% in May of 2017!! Great job!   Timothy [name] , Thank you for taking time to come for your Medicare Wellness Visit. I appreciate your ongoing commitment to your health goals. Please review the following plan we discussed and let me know if I can assist you in the future.   These are the goals we discussed: Try to exercise every day - goal is to exercise 150 minutes each week.   Increase non-starchy vegetables - carrots, green bean, squash, zucchini, tomatoes, onions, peppers, spinach and other green leafy vegetables, cabbage, lettuce, cucumbers, asparagus, okra (not fried), eggplant Limit sugar and processed foods (cakes, cookies, ice cream, crackers and chips) Increase fresh fruit but limit serving sizes 1/2 cup or about the size of tennis or baseball Limit red meat to no more than 1-2 times per week (serving size about the size of your palm) Choose whole grains / lean proteins - whole wheat bread, quinoa, whole grain rice (1/2 cup), fish, chicken, [place] Avoid sugar and calorie containing beverages - soda, sweet tea and juice.  Choose water or unsweetened tea instead.   This is a list of the screening recommended for you and due dates:  Health Maintenance  Topic Date Due  . Flu Shot  07/17/2016  . Eye exam for diabetics  10/13/2016  . Hemoglobin A1C  11/09/2016  . Complete foot exam   05/09/2017  . Tetanus Vaccine  09/16/2021  . Colon Cancer Screening  08/26/2022  . Shingles Vaccine  Completed  . Pneumonia vaccines  Completed

## 2016-08-15 NOTE — Progress Notes (Signed)
Patient ID: Alcides Dorcely, male   DOB: 05-14-1941, 75 y.o.   MRN: [id number]    Subjective:   Dartanion Pytel is a 75 y.o. male who presents for a subsequent Medicare Annual Wellness Visit and to follow up diabetes.  At our last visit started [place] 50mg  qd Mr. Brosky is married.  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  Review of Systems  Review of Systems  Constitutional: Negative.   HENT: Negative.   Eyes: Negative.   Respiratory: Negative.   Cardiovascular: Negative.   Gastrointestinal: Positive for heartburn (take Nexium qod for relief).  Genitourinary: Negative.   Musculoskeletal: Negative.   Skin: Negative.   Neurological: Negative.   Endo/Heme/Allergies: Negative.   Psychiatric/Behavioral: The patient has insomnia (only sleeps about 5 hours per night - he has sleep study planned after seeing pulmonologist earlier this month).      Home BG readings:  7 day average = 133 14 day average = 134 30 day average = 138 90 day average = 141 Ranges from 120-157  Current Medications (verified) Outpatient Encounter Prescriptions as of 08/15/2016  Medication Sig  . amLODipine-olmesartan (AZOR) 5-40 MG per tablet Take 1 tablet by mouth daily.  Marland Kitchen aspirin 81 MG EC tablet Take 81 mg by mouth daily.    . Choline Fenofibrate (FENOFIBRIC ACID) 135 MG CPDR TAKE (1) CAPSULE DAILY  . esomeprazole (NEXIUM) 40 MG capsule TAKE (1) CAPSULE DAILY (Patient taking differently: every other day. TAKE (1) CAPSULE DAILY)  . glimepiride (AMARYL) 4 MG tablet Take 1 tablet (4 mg total) by mouth 2 (two) times daily.  . hydrochlorothiazide (HYDRODIURIL) 25 MG tablet Take 1 tablet (25 mg total) by mouth daily.  Marland Kitchen latanoprost (XALATAN) 0.005 % ophthalmic solution Place 1 drop into both eyes at bedtime.  . metFORMIN (GLUCOPHAGE-XR) 500 MG 24 hr tablet TAKE 1 TABLET ONCE DAILY WITH BREAKFAST  . metoprolol succinate (TOPROL-XL) 50 MG 24 hr tablet TAKE 1 TABLET ONCE A DAY WITH A  MEAL  . rosuvastatin (CRESTOR) 10 MG tablet Take 1 tablet (10 mg total) by mouth daily.  . sitaGLIPtin (JANUVIA) 50 MG tablet Take 1 tablet (50 mg total) by mouth daily.  Marland Kitchen VASCEPA 1 g CAPS TAKE (2) CAPSULES TWICE DAILY.  . nitroGLYCERIN (NITROSTAT) 0.4 MG SL tablet Place 1 tablet (0.4 mg total) under the tongue every 5 (five) minutes as needed. (Patient not taking: Reported on 08/15/2016)  . SitaGLIPtin-MetFORMIN HCl (JANUMET XR) 50-500 MG TB24 Take 1 tablet by mouth daily.   No facility-administered encounter medications on file as of 08/15/2016.     Allergies (verified) Hydrocodone-acetaminophen   History: Past Medical History:  Diagnosis Date  . Cataract   . Chronic kidney disease   . Colon polyp   . Coronary artery disease    (left main normal, LAD 25-30% stenosis, distal 30-40% stenosis, circumflex obtuse marginal 50% stenosis,  right coronary artery dominant with long 75% followed by mid 80%  stenosis followed by 9% stenosis at the ostium of the PDA.  He had  stenting of the PDA by Dr. [name].  This was a Cypher stent.   First angioplasty was 1994, Cath 2011 100% RCA ).      . Diabetes mellitus without complication ([place])   . Essential hypertension, benign   . Glaucoma   . Hyperplasia of prostate   . 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  androplasty    . angioplasty with 3 stents    . APPENDECTOMY    . bilateral hernia repair    . CERVICAL SPINE SURGERY    . CHOLECYSTECTOMY    . cholestectomy    . EYE SURGERY Bilateral    cataracts   Family History  Problem Relation Age of Onset  . Cancer Mother     unsure of type  . Glaucoma Mother   . Heart disease Father   . Coronary artery disease Father   . Cancer Sister 24    granlocytosis (Wergerner's)  . Colon cancer Brother 11  . Cancer Brother     colon  . Nephritis Sister     20's  . Cancer Brother     lung    Social History    Occupational History  . retired    Social History Main Topics  . Smoking status: Former Smoker    Packs/day: 2.00    Years: 25.00    Types: Cigarettes    Start date: 06/08/1957    Quit date: 08/14/1991  . Smokeless tobacco: Never Used  . Alcohol use 0.0 oz/week     Comment: occ/rare  . Drug use: No  . Sexual activity: Yes    Do you feel safe at home?  Yes Are there smokers in your home (other than you)? No  Dietary issues and exercise activities discussed: Current Exercise Habits: The patient has a physically strenous job, but has no regular exercise apart from work. (works at Warehouse manager)  Current Dietary habits:  Since our last visit 3 months ago he has decreased intake of bread (was having 2 pieces of toast each morning -now only have 1 or none) Continues to try to limit sweets.  Reports that he is doing better but there are still moments of indiscretion.    Objective:    Today's Vitals   08/15/16 0826  BP: 114/70  Pulse: 68  Weight: 191 lb (86.6 kg)  Height: 5\' 7"  (1.702 m)  PainSc: 0-No pain   Body mass index is 29.91 kg/m.   A1c = 6.2% today A1c = 7.6% (05/09/2016)   Activities of Daily Living In your present state of health, do you have any difficulty performing the following activities: 08/15/2016  Hearing? N  Vision? N  Difficulty concentrating or making decisions? N  Walking or climbing stairs? N  Dressing or bathing? N  Doing errands, shopping? N  Preparing Food and eating ? N  Using the Toilet? N  In the past six months, have you accidently leaked urine? N  Do you have problems with loss of bowel control? N  Managing your Medications? N  Managing your Finances? N  Housekeeping or managing your Housekeeping? N  Some recent data might be hidden     Depression Screen PHQ 2/9 Scores 08/15/2016 05/09/2016 12/22/2015 08/30/2015  PHQ - 2 Score 0 0 0 0     Fall Risk Fall Risk  08/15/2016 05/09/2016 12/22/2015 08/30/2015 05/13/2015  Falls in the past  year? No No No No No    Cognitive Function: MMSE - Mini Mental State Exam 08/15/2016 05/13/2015 05/13/2015 05/13/2015  Orientation to time 5 5 5 5   Orientation to Place 5 5 5  -  Registration 3 3 3  -  Attention/ Calculation 5 5 5  -  Recall 3 2 2  -  Language- name 2 objects 2 2 - -  Language- repeat 1 1 1  -  Language- follow 3 step command 3 3 3  -  Language- read & follow direction 1 1 1  -  Write a sentence 1 1 1  -  Copy design 1 1 1  -  Total score 30 29 - -    Immunizations and Health Maintenance Immunization History  Administered Date(s) Administered  . Influenza Whole 08/17/2010  . Influenza,inj,Quad PF,36+ Mos 09/28/2013, 10/11/2014, 10/10/2015  . Pneumococcal Conjugate-13 10/11/2014  . Pneumococcal Polysaccharide-23 06/16/2008  . Td 02/15/2003  . Tdap 09/17/2011  . Zoster 05/18/2007   Health Maintenance Due  Topic Date Due  . INFLUENZA VACCINE  07/17/2016    Patient Care Team: Chipper Herb, MD as PCP - General (Family Medicine) Clent Jacks, MD as Consulting Physician (Ophthalmology) Minus Breeding, MD as Consulting Physician (Cardiology) Chesley Mires, MD as Consulting Physician (Pulmonary Disease) Lavonna Monarch, MD as Consulting Physician (Dermatology)  Indicate any recent Medical Services you may have received from other than [facility] providers in the past year (date may be approximate).    Assessment:    Annual Wellness Visit  Type 2 DM - much improved Overweight - weight is down 5# since Annual Wellness visit Last year (04/2015)   Screening Tests Health Maintenance  Topic Date Due  . INFLUENZA VACCINE  07/17/2016  . OPHTHALMOLOGY EXAM  10/13/2016  . HEMOGLOBIN A1C  11/09/2016  . FOOT EXAM  05/09/2017  . TETANUS/TDAP  09/16/2021  . COLONOSCOPY  08/26/2022  . ZOSTAVAX  Completed  . PNA vac Low Risk Adult  Completed        Plan:   During the course of the visit [place] was educated and counseled about the following appropriate screening and preventive  services:   Vaccines to include Pneumoccal, Influenza,  Td, Zostavax - vaccines are currently UTD.  Patient will get influenza vaccines at PCP visit 09/23/16  Colorectal cancer screening - UTD  Cardiovascular disease screening - UTD; just saw cardiologist 07/26/16  Diabetes - great improvement in A1c and home BG readings.  Changed to Janumet XR 50/500mg  take 1 tablet daily (combining Januvia 50mg  + metformin Xr 500mg ) for ease of administration.    Continue to check BG 1-2 times daily  Nutrition - continue to limit high CHO foods and calories  Weight - goal BMI is 25.  Weight is improving slowly  Physical activity - discussed increasing to goal of 150 minutes per week.  Continue with active lifestyle but try to add aerobic activity like walking.   Glaucoma screening / Diabetic Eye Exam - next due 09/2016 - patient already has appt with Dr Katy Fitch  Prostate cancer screening - UTD  Advanced Directives- UTD, copy requested     Patient Instructions (the written plan) were given to the patient.   Cherre Robins, PharmD   08/15/2016

## 2016-08-16 DIAGNOSIS — G4733 Obstructive sleep apnea (adult) (pediatric): Secondary | ICD-10-CM | POA: Diagnosis not present

## 2016-08-16 LAB — MICROALBUMIN / CREATININE URINE RATIO
Creatinine, Urine: 82.6 mg/dL
MICROALB/CREAT RATIO: 51.5 mg/g creat — ABNORMAL HIGH (ref 0.0–30.0)
Microalbumin, Urine: 42.5 ug/mL

## 2016-08-17 ENCOUNTER — Encounter: Payer: Self-pay | Admitting: Pulmonary Disease

## 2016-08-17 ENCOUNTER — Telehealth: Payer: Self-pay | Admitting: Pulmonary Disease

## 2016-08-17 DIAGNOSIS — G4733 Obstructive sleep apnea (adult) (pediatric): Secondary | ICD-10-CM

## 2016-08-17 HISTORY — DX: Obstructive sleep apnea (adult) (pediatric): G47.33

## 2016-08-17 NOTE — Telephone Encounter (Signed)
HST 08/16/16 >> AHI 18.1, SaO2 low 79%.  Spent 72.6 min with SaO2 < 89%   Will have my nurse inform pt that he has moderate sleep apnea and low oxygen level.  He needs to get set up for CPAP titration study in sleep lab.  Will call to schedule follow up after this, unless he would like ROV first prior to arranging CPAP titration study.

## 2016-08-17 NOTE — Telephone Encounter (Signed)
Results have been explained to patient, pt expressed understanding. Order for Titration study placed.  Nothing further needed.

## 2016-08-21 ENCOUNTER — Other Ambulatory Visit: Payer: Self-pay | Admitting: *Deleted

## 2016-08-21 DIAGNOSIS — G4710 Hypersomnia, unspecified: Secondary | ICD-10-CM

## 2016-08-28 ENCOUNTER — Ambulatory Visit (HOSPITAL_BASED_OUTPATIENT_CLINIC_OR_DEPARTMENT_OTHER): Payer: Medicare HMO | Attending: Pulmonary Disease | Admitting: Pulmonary Disease

## 2016-08-28 VITALS — Ht 67.0 in | Wt 192.0 lb

## 2016-08-28 DIAGNOSIS — I493 Ventricular premature depolarization: Secondary | ICD-10-CM | POA: Insufficient documentation

## 2016-08-28 DIAGNOSIS — G4761 Periodic limb movement disorder: Secondary | ICD-10-CM | POA: Diagnosis not present

## 2016-08-28 DIAGNOSIS — G4733 Obstructive sleep apnea (adult) (pediatric): Secondary | ICD-10-CM | POA: Diagnosis not present

## 2016-08-31 ENCOUNTER — Other Ambulatory Visit: Payer: Self-pay | Admitting: Family Medicine

## 2016-09-10 ENCOUNTER — Other Ambulatory Visit: Payer: Self-pay | Admitting: Family Medicine

## 2016-09-21 ENCOUNTER — Ambulatory Visit (INDEPENDENT_AMBULATORY_CARE_PROVIDER_SITE_OTHER): Payer: Medicare HMO | Admitting: Family Medicine

## 2016-09-21 ENCOUNTER — Encounter: Payer: Self-pay | Admitting: Family Medicine

## 2016-09-21 VITALS — BP 113/56 | HR 75 | Temp 97.4°F | Ht 67.0 in | Wt 192.0 lb

## 2016-09-21 DIAGNOSIS — I10 Essential (primary) hypertension: Secondary | ICD-10-CM

## 2016-09-21 DIAGNOSIS — I2510 Atherosclerotic heart disease of native coronary artery without angina pectoris: Secondary | ICD-10-CM | POA: Diagnosis not present

## 2016-09-21 DIAGNOSIS — E785 Hyperlipidemia, unspecified: Secondary | ICD-10-CM | POA: Diagnosis not present

## 2016-09-21 DIAGNOSIS — N183 Chronic kidney disease, stage 3 unspecified: Secondary | ICD-10-CM

## 2016-09-21 DIAGNOSIS — E1121 Type 2 diabetes mellitus with diabetic nephropathy: Secondary | ICD-10-CM | POA: Diagnosis not present

## 2016-09-21 DIAGNOSIS — Z23 Encounter for immunization: Secondary | ICD-10-CM

## 2016-09-21 DIAGNOSIS — E559 Vitamin D deficiency, unspecified: Secondary | ICD-10-CM

## 2016-09-21 DIAGNOSIS — E782 Mixed hyperlipidemia: Secondary | ICD-10-CM | POA: Diagnosis not present

## 2016-09-21 DIAGNOSIS — E1122 Type 2 diabetes mellitus with diabetic chronic kidney disease: Secondary | ICD-10-CM

## 2016-09-21 NOTE — Patient Instructions (Addendum)
Medicare Annual Wellness Visit  [facility] and the medical providers at [facility] strive to bring you the best medical care.  In doing so we not only want to address your current medical conditions and concerns but also to detect new conditions early and prevent illness, disease and health-related problems.    Medicare offers a yearly Wellness Visit which allows our clinical staff to assess your need for preventative services including immunizations, lifestyle education, counseling to decrease risk of preventable diseases and screening for fall risk and other medical concerns.    This visit is provided free of charge (no copay) for all Medicare recipients. The clinical pharmacists at [facility] have begun to conduct these Wellness Visits which will also include a thorough review of all your medications.    As you primary medical provider recommend that you make an appointment for your Annual Wellness Visit if you have not done so already this year.  You may set up this appointment before you leave today or you may call back ([phone]) and schedule an appointment.  Please make sure when you call that you mention that you are scheduling your Annual Wellness Visit with the clinical pharmacist so that the appointment may be made for the proper length of time.     Continue current medications. Continue good therapeutic lifestyle changes which include good diet and exercise. Fall precautions discussed with patient. If an FOBT was given today- please return it to our front desk. If you are over 13 years old - you may need Prevnar 39 or the adult Pneumonia vaccine.  **Flu shots are available--- please call and schedule a FLU-CLINIC appointment**  After your visit with [place] today you will receive a survey in the mail or online from [employer] regarding your care with [place]. Please take a moment to fill this out. Your feedback is very  important to [place] as you can help [place] better understand your patient needs as well as improve your experience and satisfaction. WE CARE ABOUT YOU!!!   Do not forget to come back and get your lab work done fasting tomorrow The flu shot that you receive today may make your arm sore Continue to follow-up aggressive therapeutic lifestyle changes Continue to drink plenty of fluids and stay well hydrated Continue to follow-up with clinical pharmacy and keep your blood sugar under good control Check some blood sugars during the day periodically before lunch and before dinner Please return the FOBT

## 2016-09-21 NOTE — Progress Notes (Signed)
Subjective:    Patient ID: Timothy [name], male    DOB: [birth year], 75 y.o.   MRN: [id number]  HPI Pt here for follow up and management of chronic medical problems which includes diabetes and hyperlipidemia. He is taking medications regularly.The patient is doing well overall. With the help of the clinical pharmacist has gotten his A1c down to 6.2% and we are very proud of that. He is going to continue with the same regimen. His weight is the same as its been in the past. He denies any chest pain or palpitations or shortness of breath. He denies any trouble with heartburn indigestion nausea vomiting diarrhea blood in the stool or black tarry bowel movements. He is passing his water well and just complains of the frequency that he has but there is no burning. His fasting blood sugars been running in the 120s. He did not check his blood sugars during the day. He will be given an FOBT to return. He is coming to come back tomorrow to get his blood work drawn. Patient is somewhat upset today because his youngest brother has been diagnosed with lung cancer. He was quite emotional about this.     Patient Active Problem List   Diagnosis Date Noted  . OSA (obstructive sleep apnea) 08/17/2016  . Abnormal serum lipase level 10/18/2015  . Elevated triglycerides with high cholesterol 10/18/2015  . CKD (chronic kidney disease) stage 3, GFR 30-59 ml/min 05/13/2015  . Glaucoma 12/27/2014  . Overweight(278.02) 06/27/2011  . ASCVD (arteriosclerotic cardiovascular disease)   . Essential hypertension, benign   . Hyperplasia of prostate   . 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 Colon polyp   . Type 2 diabetes mellitus with diabetic nephropathy ([place]) 05/24/2010  . Hyperlipidemia LDL goal <70 05/13/2009  . Coronary atherosclerosis 05/13/2009   Outpatient Encounter Prescriptions as of 09/21/2016  Medication Sig  . amLODipine-olmesartan (AZOR) 5-40 MG per tablet Take 1 tablet by mouth  daily.  Marland Kitchen aspirin 81 MG EC tablet Take 81 mg by mouth daily.    . Choline Fenofibrate (FENOFIBRIC ACID) 135 MG CPDR TAKE (1) CAPSULE DAILY  . esomeprazole (NEXIUM) 40 MG capsule TAKE (1) CAPSULE DAILY (Patient taking differently: every other day. TAKE (1) CAPSULE DAILY)  . glimepiride (AMARYL) 4 MG tablet TAKE  (1)  TABLET TWICE A DAY.  . hydrochlorothiazide (HYDRODIURIL) 25 MG tablet Take 1 tablet (25 mg total) by mouth daily.  Marland Kitchen latanoprost (XALATAN) 0.005 % ophthalmic solution Place 1 drop into both eyes at bedtime.  . metoprolol succinate (TOPROL-XL) 50 MG 24 hr tablet TAKE 1 TABLET ONCE A DAY WITH A MEAL  . nitroGLYCERIN (NITROSTAT) 0.4 MG SL tablet Place 1 tablet (0.4 mg total) under the tongue every 5 (five) minutes as needed.  . rosuvastatin (CRESTOR) 10 MG tablet Take 1 tablet (10 mg total) by mouth daily.  . SitaGLIPtin-MetFORMIN HCl (JANUMET XR) 50-500 MG TB24 Take 1 tablet by mouth daily.  Marland Kitchen VASCEPA 1 g CAPS TAKE (2) CAPSULES TWICE DAILY.   No facility-administered encounter medications on file as of 09/21/2016.       Review of Systems  Constitutional: Negative.   HENT: Negative.   Eyes: Negative.   Respiratory: Negative.   Cardiovascular: Negative.   Gastrointestinal: Negative.   Endocrine: Negative.   Genitourinary: Negative.   Musculoskeletal: Negative.   Skin: Negative.   Allergic/Immunologic: Negative.   Neurological: Negative.   Hematological: Negative.   Psychiatric/Behavioral: Negative.  Objective:   Physical Exam  Constitutional: He is oriented to person, place, and time. He appears well-developed and well-nourished. No distress.  Pleasant and alert  HENT:  Head: Normocephalic and atraumatic.  Right Ear: External ear normal.  Left Ear: External ear normal.  Nose: Nose normal.  Mouth/Throat: Oropharynx is clear and moist. No oropharyngeal exudate.  Eyes: Conjunctivae and EOM are normal. Pupils are equal, round, and reactive to light. Right eye  exhibits no discharge. Left eye exhibits no discharge. No scleral icterus.  Neck: Normal range of motion. Neck supple. No thyromegaly present.  No bruits thyromegaly or anterior cervical adenopathy  Cardiovascular: Normal rate, normal heart sounds and intact distal pulses.   No murmur heard. The heart was slightly irregular at 72/m  Pulmonary/Chest: Effort normal and breath sounds normal. No respiratory distress. He has no wheezes. He has no rales. He exhibits no tenderness.  No axillary adenopathy  Abdominal: Soft. Bowel sounds are normal. He exhibits no mass. There is no tenderness. There is no rebound and no guarding.  Mildly obese and slightly tender in the right upper quadrant over scar tissue from gallbladder area. There was no liver or spleen enlargement and no masses and no inguinal adenopathy  Musculoskeletal: Normal range of motion. He exhibits no edema.  Lymphadenopathy:    He has no cervical adenopathy.  Neurological: He is alert and oriented to person, place, and time. He has normal reflexes. No cranial nerve deficit.  Skin: Skin is warm and dry. No rash noted.  Psychiatric: He has a normal mood and affect. His behavior is normal. Judgment and thought content normal.  Nursing note and vitals reviewed.  BP (!) 113/56 (BP Location: Left Arm)   Pulse 75   Temp 97.4 F (36.3 C) (Oral)   Ht '5\' 7"'  (1.702 m)   Wt 192 lb (87.1 kg)   BMI 30.07 kg/m         Assessment & Plan:  1. Type 2 diabetes mellitus with stage 3 chronic kidney disease, without long-term current use of insulin ([place]) -Continue regular follow-up with clinical pharmacy and continue to work aggressively on therapeutic lifestyle changes and continue with current treatment pending results of lab work - [employer] [place] Hb A1c Waived; Future - BMP8+EGFR; Future - CBC with Differential/Platelet; Future  2. Hyperlipidemia LDL goal <70 -Continue with current treatment and omega-3 fatty acids pending results of lab  work - CBC with Differential/Platelet; Future - NMR, lipoprofile; Future  3. CKD (chronic kidney disease) stage 3, GFR 30-59 ml/min -Continue to watch sodium intake and monitor blood pressures closely and keep the blood pressure under the best control possible - BMP8+EGFR; Future - CBC with Differential/Platelet; Future  4. ASCVD (arteriosclerotic cardiovascular disease) -Continue regular follow-up with cardiology - CBC with Differential/Platelet; Future - NMR, lipoprofile; Future  5. Essential hypertension, benign -Blood pressure is good today and he will continue with current treatment - BMP8+EGFR; Future - CBC with Differential/Platelet; Future - Hepatic function panel; Future  6. Vitamin D deficiency -Continue with current treatment pending results of lab work - CBC with Differential/Platelet; Future - VITAMIN D 25 Hydroxy (Vit-D Deficiency, Fractures); Future  7. Elevated triglycerides with high cholesterol -Continue with omega-3 fatty acids and atorvastatin and aggressive therapeutic lifestyle changes pending results of lab work  8. Type 2 diabetes mellitus with diabetic nephropathy, without long-term current use of insulin (HCC) -Continue with current blood sugar treatment  Patient Instructions  Medicare Annual Wellness Visit  [facility] and the medical providers at [facility] strive to bring you the best medical care.  In doing so we not only want to address your current medical conditions and concerns but also to detect new conditions early and prevent illness, disease and health-related problems.    Medicare offers a yearly Wellness Visit which allows our clinical staff to assess your need for preventative services including immunizations, lifestyle education, counseling to decrease risk of preventable diseases and screening for fall risk and other medical concerns.    This visit is provided free of charge (no copay) for  all Medicare recipients. The clinical pharmacists at [facility] have begun to conduct these Wellness Visits which will also include a thorough review of all your medications.    As you primary medical provider recommend that you make an appointment for your Annual Wellness Visit if you have not done so already this year.  You may set up this appointment before you leave today or you may call back ([phone]) and schedule an appointment.  Please make sure when you call that you mention that you are scheduling your Annual Wellness Visit with the clinical pharmacist so that the appointment may be made for the proper length of time.     Continue current medications. Continue good therapeutic lifestyle changes which include good diet and exercise. Fall precautions discussed with patient. If an FOBT was given today- please return it to our front desk. If you are over 52 years old - you may need Prevnar 77 or the adult Pneumonia vaccine.  **Flu shots are available--- please call and schedule a FLU-CLINIC appointment**  After your visit with [place] today you will receive a survey in the mail or online from [employer] regarding your care with [place]. Please take a moment to fill this out. Your feedback is very important to [place] as you can help [place] better understand your patient needs as well as improve your experience and satisfaction. WE CARE ABOUT YOU!!!   Do not forget to come back and get your lab work done fasting tomorrow The flu shot that you receive today may make your arm sore Continue to follow-up aggressive therapeutic lifestyle changes Continue to drink plenty of fluids and stay well hydrated Continue to follow-up with clinical pharmacy and keep your blood sugar under good control Check some blood sugars during the day periodically before lunch and before dinner Please return the FOBT     Arrie Senate MD

## 2016-09-22 ENCOUNTER — Other Ambulatory Visit (INDEPENDENT_AMBULATORY_CARE_PROVIDER_SITE_OTHER): Payer: Medicare HMO

## 2016-09-22 DIAGNOSIS — I2510 Atherosclerotic heart disease of native coronary artery without angina pectoris: Secondary | ICD-10-CM

## 2016-09-22 DIAGNOSIS — E1122 Type 2 diabetes mellitus with diabetic chronic kidney disease: Secondary | ICD-10-CM

## 2016-09-22 DIAGNOSIS — E559 Vitamin D deficiency, unspecified: Secondary | ICD-10-CM

## 2016-09-22 DIAGNOSIS — E785 Hyperlipidemia, unspecified: Secondary | ICD-10-CM | POA: Diagnosis not present

## 2016-09-22 DIAGNOSIS — N183 Chronic kidney disease, stage 3 unspecified: Secondary | ICD-10-CM

## 2016-09-22 DIAGNOSIS — I10 Essential (primary) hypertension: Secondary | ICD-10-CM

## 2016-09-23 ENCOUNTER — Encounter: Payer: Self-pay | Admitting: Family Medicine

## 2016-09-23 DIAGNOSIS — D696 Thrombocytopenia, unspecified: Secondary | ICD-10-CM | POA: Insufficient documentation

## 2016-09-24 ENCOUNTER — Other Ambulatory Visit (INDEPENDENT_AMBULATORY_CARE_PROVIDER_SITE_OTHER): Payer: Medicare HMO

## 2016-09-24 ENCOUNTER — Telehealth: Payer: Self-pay | Admitting: Pulmonary Disease

## 2016-09-24 DIAGNOSIS — Z1211 Encounter for screening for malignant neoplasm of colon: Secondary | ICD-10-CM | POA: Diagnosis not present

## 2016-09-24 LAB — CBC WITH DIFFERENTIAL/PLATELET
Basophils Absolute: 0 10*3/uL (ref 0.0–0.2)
Basos: 1 %
EOS (ABSOLUTE): 0.1 10*3/uL (ref 0.0–0.4)
Eos: 2 %
Hematocrit: 38.1 % (ref 37.5–51.0)
Hemoglobin: 12.8 g/dL (ref 12.6–17.7)
Immature Grans (Abs): 0 10*3/uL (ref 0.0–0.1)
Immature Granulocytes: 0 %
Lymphocytes Absolute: 1.4 10*3/uL (ref 0.7–3.1)
Lymphs: 26 %
MCH: 30.5 pg (ref 26.6–33.0)
MCHC: 33.6 g/dL (ref 31.5–35.7)
MCV: 91 fL (ref 79–97)
Monocytes Absolute: 0.5 10*3/uL (ref 0.1–0.9)
Monocytes: 9 %
Neutrophils Absolute: 3.3 10*3/uL (ref 1.4–7.0)
Neutrophils: 62 %
Platelets: 146 10*3/uL — ABNORMAL LOW (ref 150–379)
RBC: 4.2 x10E6/uL (ref 4.14–5.80)
RDW: 14 % (ref 12.3–15.4)
WBC: 5.2 10*3/uL (ref 3.4–10.8)

## 2016-09-24 LAB — HEPATIC FUNCTION PANEL
ALT: 30 IU/L (ref 0–44)
AST: 27 IU/L (ref 0–40)
Albumin: 4.2 g/dL (ref 3.5–4.8)
Alkaline Phosphatase: 52 IU/L (ref 39–117)
Bilirubin Total: 0.2 mg/dL (ref 0.0–1.2)
Bilirubin, Direct: 0.12 mg/dL (ref 0.00–0.40)
Total Protein: 7 g/dL (ref 6.0–8.5)

## 2016-09-24 LAB — NMR, LIPOPROFILE
Cholesterol: 150 mg/dL (ref 100–199)
HDL Cholesterol by NMR: 15 mg/dL — ABNORMAL LOW (ref >39)
HDL Particle Number: 17.8 umol/L — ABNORMAL LOW (ref >=30.5)
LDL Particle Number: 1229 nmol/L — ABNORMAL HIGH (ref <1000)
LDL Size: 19.6 nm (ref >20.5)
LP-IR Score: 75 — ABNORMAL HIGH (ref <=45)
Small LDL Particle Number: 1086 nmol/L — ABNORMAL HIGH (ref <=527)
Triglycerides by NMR: 538 mg/dL — ABNORMAL HIGH (ref 0–149)

## 2016-09-24 LAB — BMP8+EGFR
BUN/Creatinine Ratio: 12 (ref 10–24)
BUN: 18 mg/dL (ref 8–27)
CO2: 22 mmol/L (ref 18–29)
Calcium: 9.3 mg/dL (ref 8.6–10.2)
Chloride: 102 mmol/L (ref 96–106)
Creatinine, Ser: 1.48 mg/dL — ABNORMAL HIGH (ref 0.76–1.27)
GFR calc Af Amer: 53 mL/min/{1.73_m2} — ABNORMAL LOW (ref >59)
GFR calc non Af Amer: 46 mL/min/{1.73_m2} — ABNORMAL LOW (ref >59)
Glucose: 153 mg/dL — ABNORMAL HIGH (ref 65–99)
Potassium: 4.5 mmol/L (ref 3.5–5.2)
Sodium: 141 mmol/L (ref 134–144)

## 2016-09-24 LAB — BAYER DCA HB A1C WAIVED: HB A1C (BAYER DCA - WAIVED): 6.4 % (ref <7.0)

## 2016-09-24 LAB — VITAMIN D 25 HYDROXY (VIT D DEFICIENCY, FRACTURES): Vit D, 25-Hydroxy: 17.4 ng/mL — ABNORMAL LOW (ref 30.0–100.0)

## 2016-09-24 NOTE — Telephone Encounter (Signed)
Pt had sleep study on 08-28-16. Pt is calling requesting these results.  VS please advise. Thanks.

## 2016-09-25 LAB — FECAL OCCULT BLOOD, IMMUNOCHEMICAL: Fecal Occult Bld: NEGATIVE

## 2016-09-26 DIAGNOSIS — G4761 Periodic limb movement disorder: Secondary | ICD-10-CM | POA: Diagnosis not present

## 2016-09-26 DIAGNOSIS — G4733 Obstructive sleep apnea (adult) (pediatric): Secondary | ICD-10-CM

## 2016-09-26 NOTE — Telephone Encounter (Signed)
CPAP titration 08/28/16 >> CPAP 11 cm H2O; central apneas with higher pressures.  Please explain to pt that there was computer glitch in sleep lab, and that is why there is a delay in getting his test results >> this has been addressed.  Please arrange for CPAP 11 cm H2O with heated humidity and mask of choice.  Schedule ROV in 2 months after getting CPAP >> can be with me or NP.

## 2016-09-26 NOTE — Procedures (Signed)
    Patient Name: Timothy, [name] Date: 08/28/2016 Gender: Male D.O.B: August 28, 1941 Age (years): 62 Referring Provider: Chesley Mires MD, ABSM Height (inches): 67 Interpreting Physician: Chesley Mires MD, ABSM Weight (lbs): 192 RPSGT: Laren Everts BMI: 30 MRN: [id number] Neck Size: 17.00  CLINICAL INFORMATION The patient is referred for a CPAP titration to treat sleep apnea. Date of HST: 08/16/16 >> AHI 18.1, SaO2 low 79%.  SLEEP STUDY TECHNIQUE As per the AASM Manual for the Scoring of Sleep and Associated Events v2.3 (April 2016) with a hypopnea requiring 4% desaturations. The channels recorded and monitored were frontal, central and occipital EEG, electrooculogram (EOG), submentalis EMG (chin), nasal and oral airflow, thoracic and abdominal wall motion, anterior tibialis EMG, snore microphone, electrocardiogram, and pulse oximetry. Continuous positive airway pressure (CPAP) was initiated at the beginning of the study and titrated to treat sleep-disordered breathing.  MEDICATIONS Medications self-administered by patient taken the night of the study : N/A  TECHNICIAN COMMENTS Comments added by technician: NONE  Comments added by scorer: N/A  RESPIRATORY PARAMETERS Optimal PAP Pressure (cm): 11 AHI at Optimal Pressure (/hr): 10.8 Overall Minimal O2 (%): 87.00 Supine % at Optimal Pressure (%): 12 Minimal O2 at Optimal Pressure (%): 90.0      He developed central apneas with CPAP settings above 11 cm H2O.  SLEEP ARCHITECTURE The study was initiated at 10:10:58 PM and ended at 4:51:21 AM. Sleep onset time was 2.8 minutes and the sleep efficiency was 87.2%. The total sleep time was 349.0 minutes. The patient spent 13.75% of the night in stage N1 sleep, 66.33% in stage N2 sleep, 0.00% in stage N3 and 19.91% in REM.Stage REM latency was 78.5 minutes Wake after sleep onset was 48.6. Alpha intrusion was absent. Supine sleep was 50.86% . CARDIAC DATA The 2 lead EKG  demonstrated sinus rhythm. The mean heart rate was 68.01 beats per minute. Other EKG findings include: PVCs.  LEG MOVEMENT DATA The total Periodic Limb Movements of Sleep (PLMS) were 1217. The PLMS index was 209.23. A PLMS index of <15 is considered normal in adults.  IMPRESSIONS - The optimal PAP pressure was 11 cm of water. - He developed central apneas with pressure settings above 11 cm H2O. - He did not require supplemental oxygen during this study. - He had a significant increase in his periodic limb movement index.  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 - Assess for presence of restless leg syndrome.  [Electronically signed] 09/26/2016 02:54 PM  Chesley Mires MD, [place], American Board of Sleep Medicine   NPI: [id number]

## 2016-09-26 NOTE — Telephone Encounter (Signed)
Rodena Piety I called and spoke with pt about his sleep study results per VS---pt is very upset and stated that he got a letter from his insurance company and they have denied his sleep lab study.  Please advise. thanks

## 2016-09-27 ENCOUNTER — Other Ambulatory Visit: Payer: Self-pay | Admitting: Family Medicine

## 2016-09-27 NOTE — Telephone Encounter (Signed)
Timothy [name] and I are working on this together. We have tried to do another case in [place] and have faxed all records pertaining to the patient in hopes of getting them to reconsider paying for the CPAP Titration Study done on 08/28/2016

## 2016-10-02 ENCOUNTER — Other Ambulatory Visit: Payer: Self-pay | Admitting: Family Medicine

## 2016-10-07 ENCOUNTER — Encounter (HOSPITAL_BASED_OUTPATIENT_CLINIC_OR_DEPARTMENT_OTHER): Payer: Medicare HMO

## 2016-10-22 ENCOUNTER — Other Ambulatory Visit: Payer: Self-pay | Admitting: Family Medicine

## 2016-11-26 ENCOUNTER — Other Ambulatory Visit: Payer: Self-pay | Admitting: Family Medicine

## 2016-12-18 ENCOUNTER — Other Ambulatory Visit: Payer: Self-pay | Admitting: Family Medicine

## 2016-12-24 ENCOUNTER — Other Ambulatory Visit: Payer: Self-pay | Admitting: Pharmacist

## 2017-02-25 ENCOUNTER — Ambulatory Visit (INDEPENDENT_AMBULATORY_CARE_PROVIDER_SITE_OTHER): Payer: Medicare HMO | Admitting: Family Medicine

## 2017-02-25 ENCOUNTER — Encounter: Payer: Self-pay | Admitting: Family Medicine

## 2017-02-25 VITALS — BP 137/61 | HR 75 | Temp 97.0°F | Ht 67.0 in | Wt 191.0 lb

## 2017-02-25 DIAGNOSIS — E785 Hyperlipidemia, unspecified: Secondary | ICD-10-CM

## 2017-02-25 DIAGNOSIS — E1121 Type 2 diabetes mellitus with diabetic nephropathy: Secondary | ICD-10-CM | POA: Diagnosis not present

## 2017-02-25 DIAGNOSIS — I2510 Atherosclerotic heart disease of native coronary artery without angina pectoris: Secondary | ICD-10-CM | POA: Diagnosis not present

## 2017-02-25 DIAGNOSIS — N183 Chronic kidney disease, stage 3 unspecified: Secondary | ICD-10-CM

## 2017-02-25 DIAGNOSIS — N400 Enlarged prostate without lower urinary tract symptoms: Secondary | ICD-10-CM | POA: Diagnosis not present

## 2017-02-25 DIAGNOSIS — I10 Essential (primary) hypertension: Secondary | ICD-10-CM | POA: Diagnosis not present

## 2017-02-25 DIAGNOSIS — Z800 Family history of malignant neoplasm of digestive organs: Secondary | ICD-10-CM | POA: Diagnosis not present

## 2017-02-25 DIAGNOSIS — E559 Vitamin D deficiency, unspecified: Secondary | ICD-10-CM | POA: Diagnosis not present

## 2017-02-25 DIAGNOSIS — E1122 Type 2 diabetes mellitus with diabetic chronic kidney disease: Secondary | ICD-10-CM

## 2017-02-25 DIAGNOSIS — N3941 Urge incontinence: Secondary | ICD-10-CM | POA: Diagnosis not present

## 2017-02-25 DIAGNOSIS — Z87891 Personal history of nicotine dependence: Secondary | ICD-10-CM

## 2017-02-25 DIAGNOSIS — D696 Thrombocytopenia, unspecified: Secondary | ICD-10-CM

## 2017-02-25 LAB — URINALYSIS, COMPLETE
Bilirubin, UA: NEGATIVE
Ketones, UA: NEGATIVE
Leukocytes, UA: NEGATIVE
Nitrite, UA: NEGATIVE
Specific Gravity, UA: 1.015 (ref 1.005–1.030)
Urobilinogen, Ur: 0.2 mg/dL (ref 0.2–1.0)
pH, UA: 5 (ref 5.0–7.5)

## 2017-02-25 LAB — MICROSCOPIC EXAMINATION
Bacteria, UA: NONE SEEN
Epithelial Cells (non renal): NONE SEEN /hpf (ref 0–10)
WBC, UA: NONE SEEN /hpf (ref 0–?)

## 2017-02-25 LAB — BAYER DCA HB A1C WAIVED: HB A1C (BAYER DCA - WAIVED): 6.5 % (ref <7.0)

## 2017-02-25 NOTE — Progress Notes (Addendum)
Subjective:    Patient ID: Timothy [name], male    DOB: May 18, 1941, 76 y.o.   MRN: [id number]  HPI Pt here for follow up and management of chronic medical problems which includes diabetes, hyperlipidemia and hypertension. He is taking medication regularly.The patient today complains of issues with bladder control. He is due to have a rectal exam and a PSA today. We will also make sure we check a urine and also get one after the prostate exam. The patient denies any chest pain or tightness pressure or palpitations. He denies any shortness of breath. He has no trouble with swallowing heartburn indigestion nausea vomiting diarrhea or blood in the stool. He says the problem with bladder control and leaking which he is unaware of when it happens has been occurring for the past 3-4 months. He also has increased urgency. He denies any burning with passing his water. He does not leak when he coughs or sneezes. His family history was reviewed. There is a family history of colon cancer in an older sibling. He also has a younger sibling who has one cancer. This patient has never had a CT of the lung. He did smoke for quite a while before he stopped several years ago.    Patient Active Problem List   Diagnosis Date Noted  . Thrombocytopenia ([place]) 09/23/2016  . OSA (obstructive sleep apnea) 08/17/2016  . Abnormal serum lipase level 10/18/2015  . Elevated triglycerides with high cholesterol 10/18/2015  . CKD (chronic kidney disease) stage 3, GFR 30-59 ml/min 05/13/2015  . Glaucoma 12/27/2014  . Overweight(278.02) 06/27/2011  . ASCVD (arteriosclerotic cardiovascular disease)   . Essential hypertension, benign   . Hyperplasia of prostate   . 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 Colon polyp   . Type 2 diabetes mellitus with diabetic nephropathy ([place]) 05/24/2010  . Hyperlipidemia LDL goal <70 05/13/2009  . Coronary atherosclerosis 05/13/2009   Outpatient Encounter Prescriptions  as of 02/25/2017  Medication Sig  . amLODipine-olmesartan (AZOR) 5-40 MG tablet TAKE 1 TABLET DAILY  . aspirin 81 MG EC tablet Take 81 mg by mouth daily.    . Choline Fenofibrate (FENOFIBRIC ACID) 135 MG CPDR TAKE (1) CAPSULE DAILY  . esomeprazole (NEXIUM) 40 MG capsule TAKE (1) CAPSULE DAILY  . glimepiride (AMARYL) 4 MG tablet TAKE (1) TABLET TWICE A DAY.  . hydrochlorothiazide (HYDRODIURIL) 25 MG tablet TAKE 1 TABLET DAILY  . JANUMET XR 50-500 MG TB24 TAKE 1 TABLET DAILY  . latanoprost (XALATAN) 0.005 % ophthalmic solution Place 1 drop into both eyes at bedtime.  . metoprolol succinate (TOPROL-XL) 50 MG 24 hr tablet TAKE 1 TABLET ONCE A DAY WITH A MEAL  . nitroGLYCERIN (NITROSTAT) 0.4 MG SL tablet Place 1 tablet (0.4 mg total) under the tongue every 5 (five) minutes as needed.  . rosuvastatin (CRESTOR) 10 MG tablet TAKE 1 TABLET DAILY  . VASCEPA 1 g CAPS TAKE (2) CAPSULES TWICE DAILY.   No facility-administered encounter medications on file as of 02/25/2017.       Review of Systems  Constitutional: Negative.   HENT: Negative.   Eyes: Negative.   Respiratory: Negative.   Cardiovascular: Negative.   Gastrointestinal: Negative.   Endocrine: Negative.   Genitourinary:       Bladder control  Musculoskeletal: Negative.   Skin: Negative.   Allergic/Immunologic: Negative.   Neurological: Negative.   Hematological: Negative.   Psychiatric/Behavioral: Negative.        Objective:   Physical Exam  Constitutional: He is oriented to person, place, and time. He appears well-developed and well-nourished. No distress.  The patient is pleasant and alert.  HENT:  Head: Normocephalic and atraumatic.  Right Ear: External ear normal.  Left Ear: External ear normal.  Nose: Nose normal.  Mouth/Throat: Oropharynx is clear and moist. No oropharyngeal exudate.  Eyes: Conjunctivae and EOM are normal. Pupils are equal, round, and reactive to light. Right eye exhibits no discharge. Left eye  exhibits no discharge. No scleral icterus.  Neck: Normal range of motion. Neck supple. No thyromegaly present.  no bruits thyromegaly or anterior cervical adenopathy  Cardiovascular: Normal rate, regular rhythm, normal heart sounds and intact distal pulses.   No murmur heard. The heart is slightly irregular at 72/m  Pulmonary/Chest: Effort normal and breath sounds normal. No respiratory distress. He has no wheezes. He has no rales. He exhibits no tenderness.  No axillary adenopathy Clear anteriorly and posteriorly  Abdominal: Soft. Bowel sounds are normal. He exhibits no mass. There is no tenderness. There is no rebound and no guarding.  Abdomen is non-tender. Minimal tenderness in the right upper quadrant at site of gallbladder scar. The patient also has a scar in the left lower quadrant for a hernia repair and he has a history of some burning in this area at times. No liver or spleen enlargement no bruits and no masses.  Genitourinary: Rectum normal and penis normal.  Genitourinary Comments: Prostate is slightly enlarged but smooth to palpation. There were no rectal masses. External genitalia were within normal limits without hernia.  Musculoskeletal: Normal range of motion. He exhibits no edema.  Lymphadenopathy:    He has no cervical adenopathy.  Neurological: He is alert and oriented to person, place, and time. He has normal reflexes. No cranial nerve deficit.  Skin: Skin is warm and dry. No rash noted.  Psychiatric: He has a normal mood and affect. His behavior is normal. Judgment and thought content normal.  Nursing note and vitals reviewed.   BP 137/61 (BP Location: Left Arm)   Pulse 75   Temp 97 F (36.1 C) (Oral)   Ht 5\' 7"  (1.702 m)   Wt 191 lb (86.6 kg)   BMI 29.91 kg/m        Assessment & Plan:  1. Type 2 diabetes mellitus with stage 3 chronic kidney disease, without long-term current use of insulin ([facility]CC) -The patient's blood sugars at home have been running in the  1:30 to 140 range in general according to the patient. He will continue to monitor these. He will continue his current treatment pending results of lab work - BMP8+EGFR - CBC with Differential/Platelet - Bayer DCA Hb A1c Waived  2. Hyperlipidemia LDL goal <70 -The patient will continue with current treatment pending results of lab work. He has had ongoing problems with elevated triglycerides. He will do better with therapeutic lifestyle changes. - CBC with Differential/Platelet - Lipid panel  3. CKD (chronic kidney disease) stage 3, GFR 30-59 ml/min -Continue to avoid all NSAIDs and only take Tylenol if needed for aches pains and fever. - BMP8+EGFR - CBC with Differential/Platelet  4. ASCVD (arteriosclerotic cardiovascular disease) -Patient is having no symptoms regarding ASCVD or chest pain. He has seen the cardiologist in the past. He'll continue to work on risk factors and keeping his cholesterol under control and his blood pressure under control. - CBC with Differential/Platelet  5. Essential hypertension, benign -The blood pressure is good today and he will continue with current treatment -  BMP8+EGFR - CBC with Differential/Platelet - Hepatic function panel  6. Vitamin D deficiency -Continue with current treatment pending results of lab work - CBC with Differential/Platelet - VITAMIN D 25 Hydroxy (Vit-D Deficiency, Fractures)  7. Benign prostatic hyperplasia, unspecified whether lower urinary tract symptoms present -The patient is having some leaking and incontinence for the past 3-4 months. He does not have any burning. We will check a urinalysis today and her urine culture and sensitivity and if no cause is found we will send him to the urologist for further evaluation. - CBC with Differential/Platelet - PSA, total and free - Urinalysis, Complete  8. Type 2 diabetes mellitus with diabetic nephropathy, without long-term current use of insulin ([place]) -Continue aggressive  therapeutic lifestyle changes and current treatment pending results of lab work  9. Urge incontinence of urine -Check urinalysis and do urine culture and sensitivity and treat accordingly  10. Thrombocytopenia -No history today of any increased bleeding issues.  Patient Instructions                       Medicare Annual Wellness Visit  [facility] and the medical providers at [facility] strive to bring you the best medical care.  In doing so we not only want to address your current medical conditions and concerns but also to detect new conditions early and prevent illness, disease and health-related problems.    Medicare offers a yearly Wellness Visit which allows our clinical staff to assess your need for preventative services including immunizations, lifestyle education, counseling to decrease risk of preventable diseases and screening for fall risk and other medical concerns.    This visit is provided free of charge (no copay) for all Medicare recipients. The clinical pharmacists at [facility] have begun to conduct these Wellness Visits which will also include a thorough review of all your medications.    As you primary medical provider recommend that you make an appointment for your Annual Wellness Visit if you have not done so already this year.  You may set up this appointment before you leave today or you may call back ([phone]) and schedule an appointment.  Please make sure when you call that you mention that you are scheduling your Annual Wellness Visit with the clinical pharmacist so that the appointment may be made for the proper length of time.     Continue current medications. Continue good therapeutic lifestyle changes which include good diet and exercise. Fall precautions discussed with patient. If an FOBT was given today- please return it to our front desk. If you are over 65 years old - you may need Prevnar 45 or the adult  Pneumonia vaccine.  **Flu shots are available--- please call and schedule a FLU-CLINIC appointment**  After your visit with [place] today you will receive a survey in the mail or online from [employer] regarding your care with [place]. Please take a moment to fill this out. Your feedback is very important to [place] as you can help [place] better understand your patient needs as well as improve your experience and satisfaction. WE CARE ABOUT YOU!!!   We will arrange for you to have a CT scan of the lungs because of your family history of lung cancer and your personal history of smoking for greater than 20 years. We will call you with your blood work results as soon as it becomes available Continue to work aggressively on diet and exercise and reduced elbow action.  Arrie Senate MD

## 2017-02-25 NOTE — Addendum Note (Signed)
Addended by: Zannie Cove on: 02/25/2017 09:52 AM   Modules accepted: Orders

## 2017-02-25 NOTE — Patient Instructions (Addendum)
Medicare Annual Wellness Visit  Timothy [name] and the medical providers at [facility] strive to bring you the best medical care.  In doing so we not only want to address your current medical conditions and concerns but also to detect new conditions early and prevent illness, disease and health-related problems.    Medicare offers a yearly Wellness Visit which allows our clinical staff to assess your need for preventative services including immunizations, lifestyle education, counseling to decrease risk of preventable diseases and screening for fall risk and other medical concerns.    This visit is provided free of charge (no copay) for all Medicare recipients. The clinical pharmacists at [facility] have begun to conduct these Wellness Visits which will also include a thorough review of all your medications.    As you primary medical provider recommend that you make an appointment for your Annual Wellness Visit if you have not done so already this year.  You may set up this appointment before you leave today or you may call back ([phone]) and schedule an appointment.  Please make sure when you call that you mention that you are scheduling your Annual Wellness Visit with the clinical pharmacist so that the appointment may be made for the proper length of time.     Continue current medications. Continue good therapeutic lifestyle changes which include good diet and exercise. Fall precautions discussed with patient. If an FOBT was given today- please return it to our front desk. If you are over 76 years old - you may need Prevnar 45 or the adult Pneumonia vaccine.  **Flu shots are available--- please call and schedule a FLU-CLINIC appointment**  After your visit with [place] today you will receive a survey in the mail or online from [employer] regarding your care with [place]. Please take a moment to fill this out. Your feedback is very  important to [place] as you can help [place] better understand your patient needs as well as improve your experience and satisfaction. WE CARE ABOUT YOU!!!   We will arrange for you to have a CT scan of the lungs because of your family history of lung cancer and your personal history of smoking for greater than 20 years. We will call you with your blood work results as soon as it becomes available Continue to work aggressively on diet and exercise and reduced elbow action.

## 2017-02-26 ENCOUNTER — Other Ambulatory Visit: Payer: Self-pay | Admitting: *Deleted

## 2017-02-26 DIAGNOSIS — R799 Abnormal finding of blood chemistry, unspecified: Secondary | ICD-10-CM

## 2017-02-26 DIAGNOSIS — R32 Unspecified urinary incontinence: Secondary | ICD-10-CM

## 2017-02-26 LAB — CBC WITH DIFFERENTIAL/PLATELET
Basophils Absolute: 0.1 10*3/uL (ref 0.0–0.2)
Basos: 1 %
EOS (ABSOLUTE): 0.1 10*3/uL (ref 0.0–0.4)
Eos: 1 %
Hematocrit: 40.4 % (ref 37.5–51.0)
Hemoglobin: 13.5 g/dL (ref 13.0–17.7)
Immature Grans (Abs): 0 10*3/uL (ref 0.0–0.1)
Immature Granulocytes: 1 %
Lymphocytes Absolute: 2.5 10*3/uL (ref 0.7–3.1)
Lymphs: 32 %
MCH: 29.6 pg (ref 26.6–33.0)
MCHC: 33.4 g/dL (ref 31.5–35.7)
MCV: 89 fL (ref 79–97)
Monocytes Absolute: 0.5 10*3/uL (ref 0.1–0.9)
Monocytes: 7 %
Neutrophils Absolute: 4.7 10*3/uL (ref 1.4–7.0)
Neutrophils: 58 %
Platelets: 202 10*3/uL (ref 150–379)
RBC: 4.56 x10E6/uL (ref 4.14–5.80)
RDW: 14.1 % (ref 12.3–15.4)
WBC: 7.9 10*3/uL (ref 3.4–10.8)

## 2017-02-26 LAB — PSA, TOTAL AND FREE
PSA, Free Pct: 60 %
PSA, Free: 0.18 ng/mL
Prostate Specific Ag, Serum: 0.3 ng/mL (ref 0.0–4.0)

## 2017-02-26 LAB — BMP8+EGFR
BUN/Creatinine Ratio: 14 (ref 10–24)
BUN: 24 mg/dL (ref 8–27)
CO2: 22 mmol/L (ref 18–29)
Calcium: 9.5 mg/dL (ref 8.6–10.2)
Chloride: 98 mmol/L (ref 96–106)
Creatinine, Ser: 1.74 mg/dL — ABNORMAL HIGH (ref 0.76–1.27)
GFR calc Af Amer: 43 mL/min/{1.73_m2} — ABNORMAL LOW (ref >59)
GFR calc non Af Amer: 38 mL/min/{1.73_m2} — ABNORMAL LOW (ref >59)
Glucose: 183 mg/dL — ABNORMAL HIGH (ref 65–99)
Potassium: 4.5 mmol/L (ref 3.5–5.2)
Sodium: 137 mmol/L (ref 134–144)

## 2017-02-26 LAB — LIPID PANEL
Chol/HDL Ratio: 10.1 ratio units — ABNORMAL HIGH (ref 0.0–5.0)
Cholesterol, Total: 171 mg/dL (ref 100–199)
HDL: 17 mg/dL — ABNORMAL LOW (ref >39)
Triglycerides: 526 mg/dL — ABNORMAL HIGH (ref 0–149)

## 2017-02-26 LAB — HEPATIC FUNCTION PANEL
ALT: 28 IU/L (ref 0–44)
AST: 31 IU/L (ref 0–40)
Albumin: 4.4 g/dL (ref 3.5–4.8)
Alkaline Phosphatase: 40 IU/L (ref 39–117)
Bilirubin Total: 0.5 mg/dL (ref 0.0–1.2)
Bilirubin, Direct: 0.13 mg/dL (ref 0.00–0.40)
Total Protein: 7.7 g/dL (ref 6.0–8.5)

## 2017-02-26 LAB — VITAMIN D 25 HYDROXY (VIT D DEFICIENCY, FRACTURES): Vit D, 25-Hydroxy: 16.7 ng/mL — ABNORMAL LOW (ref 30.0–100.0)

## 2017-02-27 ENCOUNTER — Other Ambulatory Visit: Payer: Self-pay | Admitting: Dermatology

## 2017-02-27 DIAGNOSIS — C44329 Squamous cell carcinoma of skin of other parts of face: Secondary | ICD-10-CM | POA: Diagnosis not present

## 2017-02-27 DIAGNOSIS — D0339 Melanoma in situ of other parts of face: Secondary | ICD-10-CM | POA: Diagnosis not present

## 2017-02-27 DIAGNOSIS — L821 Other seborrheic keratosis: Secondary | ICD-10-CM | POA: Diagnosis not present

## 2017-02-27 DIAGNOSIS — D492 Neoplasm of unspecified behavior of bone, soft tissue, and skin: Secondary | ICD-10-CM | POA: Diagnosis not present

## 2017-02-27 DIAGNOSIS — L814 Other melanin hyperpigmentation: Secondary | ICD-10-CM | POA: Diagnosis not present

## 2017-02-27 DIAGNOSIS — C4330 Malignant melanoma of unspecified part of face: Secondary | ICD-10-CM | POA: Diagnosis not present

## 2017-02-27 DIAGNOSIS — L570 Actinic keratosis: Secondary | ICD-10-CM | POA: Diagnosis not present

## 2017-02-28 DIAGNOSIS — Z961 Presence of intraocular lens: Secondary | ICD-10-CM | POA: Diagnosis not present

## 2017-02-28 DIAGNOSIS — H2589 Other age-related cataract: Secondary | ICD-10-CM | POA: Diagnosis not present

## 2017-02-28 DIAGNOSIS — E119 Type 2 diabetes mellitus without complications: Secondary | ICD-10-CM | POA: Diagnosis not present

## 2017-02-28 DIAGNOSIS — H40053 Ocular hypertension, bilateral: Secondary | ICD-10-CM | POA: Diagnosis not present

## 2017-02-28 LAB — HM DIABETES EYE EXAM

## 2017-03-04 ENCOUNTER — Other Ambulatory Visit: Payer: Self-pay | Admitting: Family Medicine

## 2017-03-05 ENCOUNTER — Other Ambulatory Visit: Payer: Self-pay

## 2017-03-13 ENCOUNTER — Encounter: Payer: Medicare HMO | Admitting: Pharmacist

## 2017-03-17 HISTORY — PX: SKIN CANCER EXCISION: SHX779

## 2017-03-20 ENCOUNTER — Other Ambulatory Visit: Payer: Self-pay | Admitting: Family Medicine

## 2017-03-27 ENCOUNTER — Other Ambulatory Visit: Payer: Self-pay | Admitting: Family Medicine

## 2017-04-15 ENCOUNTER — Other Ambulatory Visit: Payer: Self-pay | Admitting: Family Medicine

## 2017-04-22 DIAGNOSIS — D0339 Melanoma in situ of other parts of face: Secondary | ICD-10-CM | POA: Diagnosis not present

## 2017-05-06 DIAGNOSIS — N3941 Urge incontinence: Secondary | ICD-10-CM | POA: Diagnosis not present

## 2017-05-06 DIAGNOSIS — R3915 Urgency of urination: Secondary | ICD-10-CM | POA: Diagnosis not present

## 2017-05-06 DIAGNOSIS — N401 Enlarged prostate with lower urinary tract symptoms: Secondary | ICD-10-CM | POA: Diagnosis not present

## 2017-05-23 DIAGNOSIS — C44329 Squamous cell carcinoma of skin of other parts of face: Secondary | ICD-10-CM | POA: Diagnosis not present

## 2017-06-06 DIAGNOSIS — M4807 Spinal stenosis, lumbosacral region: Secondary | ICD-10-CM | POA: Diagnosis not present

## 2017-06-06 DIAGNOSIS — G8929 Other chronic pain: Secondary | ICD-10-CM | POA: Diagnosis not present

## 2017-06-06 DIAGNOSIS — M5442 Lumbago with sciatica, left side: Secondary | ICD-10-CM | POA: Diagnosis not present

## 2017-06-14 ENCOUNTER — Encounter: Payer: Self-pay | Admitting: *Deleted

## 2017-06-17 ENCOUNTER — Other Ambulatory Visit: Payer: Self-pay | Admitting: Family Medicine

## 2017-06-25 DIAGNOSIS — M5442 Lumbago with sciatica, left side: Secondary | ICD-10-CM | POA: Diagnosis not present

## 2017-07-12 ENCOUNTER — Ambulatory Visit: Payer: Medicare HMO | Admitting: Family Medicine

## 2017-07-17 ENCOUNTER — Encounter: Payer: Self-pay | Admitting: Family Medicine

## 2017-07-17 ENCOUNTER — Ambulatory Visit (INDEPENDENT_AMBULATORY_CARE_PROVIDER_SITE_OTHER): Payer: Medicare HMO | Admitting: Family Medicine

## 2017-07-17 VITALS — BP 113/65 | HR 80 | Temp 97.0°F | Ht 67.0 in | Wt 193.0 lb

## 2017-07-17 DIAGNOSIS — E785 Hyperlipidemia, unspecified: Secondary | ICD-10-CM

## 2017-07-17 DIAGNOSIS — I2510 Atherosclerotic heart disease of native coronary artery without angina pectoris: Secondary | ICD-10-CM | POA: Diagnosis not present

## 2017-07-17 DIAGNOSIS — N3941 Urge incontinence: Secondary | ICD-10-CM

## 2017-07-17 DIAGNOSIS — Z800 Family history of malignant neoplasm of digestive organs: Secondary | ICD-10-CM

## 2017-07-17 DIAGNOSIS — E1122 Type 2 diabetes mellitus with diabetic chronic kidney disease: Secondary | ICD-10-CM

## 2017-07-17 DIAGNOSIS — N183 Chronic kidney disease, stage 3 unspecified: Secondary | ICD-10-CM

## 2017-07-17 DIAGNOSIS — I10 Essential (primary) hypertension: Secondary | ICD-10-CM

## 2017-07-17 DIAGNOSIS — C439 Malignant melanoma of skin, unspecified: Secondary | ICD-10-CM | POA: Diagnosis not present

## 2017-07-17 DIAGNOSIS — E559 Vitamin D deficiency, unspecified: Secondary | ICD-10-CM | POA: Diagnosis not present

## 2017-07-17 DIAGNOSIS — D696 Thrombocytopenia, unspecified: Secondary | ICD-10-CM | POA: Diagnosis not present

## 2017-07-17 NOTE — Progress Notes (Signed)
Subjective:    Patient ID: Timothy [name], male    DOB: 08/29/1941, 76 y.o.   MRN: [id number]  HPI Pt here for follow up and management of chronic medical problems which includes diabetes, hypertension and hyperlipidemia. He is taking medication regularly.The patient is doing well overall with no specific complaints. He will come back for his lab work fasting. His vital signs were stable. His weight is down 4 pounds but he still has an elevated BMI. The patient is doing well overall. 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 The removal site with Mohs surgery has well healed. The patient denies any chest pain or shortness of breath. He stays active physically. He denies any trouble with his stomach including nausea vomiting diarrhea or blood in the stool. He is seen the urologist regularly because of incontinence that is not stress related. He is taking some medication now for this that may be helping some but has a return appointment with the urologist soon. He says that his blood sugars at home and been running around the 130 in the morning and 160 in the evening. He has one brother and 2 sisters. One brother does have one cancer and he is younger than the patient. The 2 sisters currently are doing fairly well but still having some health issues.  Patient Active Problem List   Diagnosis Date Noted  . Thrombocytopenia ([place]) 09/23/2016  . OSA (obstructive sleep apnea) 08/17/2016  . Abnormal serum lipase level 10/18/2015  . Elevated triglycerides with high cholesterol 10/18/2015  . CKD (chronic kidney disease) stage 3, GFR 30-59 ml/min 05/13/2015  . Glaucoma 12/27/2014  . Overweight(278.02) 06/27/2011  . ASCVD (arteriosclerotic cardiovascular disease)   . Essential hypertension, benign   . Hyperplasia of prostate   . 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 Colon polyp   . Type 2 diabetes mellitus with diabetic nephropathy ([place]) 05/24/2010  .  Hyperlipidemia LDL goal <70 05/13/2009  . Coronary atherosclerosis 05/13/2009   Outpatient Encounter Prescriptions as of 07/17/2017  Medication Sig  . amLODipine-olmesartan (AZOR) 5-40 MG tablet TAKE 1 TABLET DAILY  . aspirin 81 MG EC tablet Take 81 mg by mouth daily.    . Choline Fenofibrate (FENOFIBRIC ACID) 135 MG CPDR TAKE (1) CAPSULE DAILY  . esomeprazole (NEXIUM) 40 MG capsule TAKE (1) CAPSULE DAILY  . glimepiride (AMARYL) 4 MG tablet TAKE (1) TABLET TWICE A DAY.  . hydrochlorothiazide (HYDRODIURIL) 25 MG tablet TAKE 1 TABLET DAILY  . JANUMET XR 50-500 MG TB24 TAKE 1 TABLET DAILY  . latanoprost (XALATAN) 0.005 % ophthalmic solution Place 1 drop into both eyes at bedtime.  . metoprolol succinate (TOPROL-XL) 50 MG 24 hr tablet TAKE 1 TABLET ONCE A DAY WITH A MEAL  . nitroGLYCERIN (NITROSTAT) 0.4 MG SL tablet Place 1 tablet (0.4 mg total) under the tongue every 5 (five) minutes as needed.  . rosuvastatin (CRESTOR) 10 MG tablet TAKE 1 TABLET DAILY  . VASCEPA 1 g CAPS TAKE (2) CAPSULES TWICE DAILY.  . [DISCONTINUED] JANUMET XR 50-500 MG TB24 TAKE 1 TABLET DAILY   No facility-administered encounter medications on file as of 07/17/2017.       Review of Systems  Constitutional: Negative.   HENT: Negative.   Eyes: Negative.   Respiratory: Negative.   Cardiovascular: Negative.   Gastrointestinal: Negative.   Endocrine: Negative.   Genitourinary: Negative.   Musculoskeletal: Negative.   Skin: Negative.   Allergic/Immunologic: Negative.  Neurological: Negative.   Hematological: Negative.   Psychiatric/Behavioral: Negative.        Objective:   Physical Exam  Constitutional: He is oriented to person, place, and time. He appears well-developed and well-nourished.  The patient is pleasant and alert and in good spirits. He had a recent melanoma removed from his left face. His incision is well healed.  HENT:  Head: Normocephalic.  Right Ear: External ear normal.  Left Ear: External  ear normal.  Nose: Nose normal.  Mouth/Throat: Oropharynx is clear and moist. No oropharyngeal exudate.  Recent melanoma  Eyes: Pupils are equal, round, and reactive to light. Conjunctivae and EOM are normal. Right eye exhibits no discharge. Left eye exhibits no discharge. No scleral icterus.  Neck: Normal range of motion. Neck supple. No thyromegaly present.  No bruits thyromegaly or anterior cervical adenopathy  Cardiovascular: Normal rate, regular rhythm, normal heart sounds and intact distal pulses.   No murmur heard. The heart is regular at 72/m  Pulmonary/Chest: Effort normal and breath sounds normal. No respiratory distress. He has no wheezes. He has no rales. He exhibits no tenderness.  Clear anteriorly and posteriorly and no axillary adenopathy  Abdominal: Soft. Bowel sounds are normal. He exhibits no mass. There is no tenderness. There is no rebound and no guarding.  No abdominal tenderness masses bruits or organ enlargement  Musculoskeletal: Normal range of motion. He exhibits no edema.  Fairly good range of motion with some back pain noted.  Lymphadenopathy:    He has no cervical adenopathy.  Neurological: He is alert and oriented to person, place, and time. A cranial nerve deficit is present.  Reflexes in lower extremity diminished left greater than right  Skin: Skin is warm and dry. No rash noted.  Scar left cheek secondary to melanoma removal  Psychiatric: He has a normal mood and affect. His behavior is normal. Judgment and thought content normal.  Nursing note and vitals reviewed.  BP 113/65 (BP Location: Left Arm)   Pulse 80   Temp (!) 97 F (36.1 C) (Oral)   Ht _0  (1.702 m)   Wt 193 lb (87.5 kg)   BMI 30.23 kg/m         Assessment & Plan:  1. Type 2 diabetes mellitus with stage 3 chronic kidney disease, without long-term current use of insulin ([place]) -Continue monitoring blood sugars regularly and work continuously on losing weight through diet and  exercise - BMP8+EGFR; Future - CBC with Differential/Platelet; Future - Bayer DCA Hb A1c Waived; Future  2. Hyperlipidemia LDL goal <70 -Continue current treatment pending lab work results - CBC with Differential/Platelet; Future - Lipid panel; Future  3. CKD (chronic kidney disease) stage 3, GFR 30-59 ml/min -Avoid NSAIDs and keep blood pressure under the best control possible and watch sodium intake - BMP8+EGFR; Future - CBC with Differential/Platelet; Future  4. ASCVD (arteriosclerotic cardiovascular disease) -Follow-up with cardiology as planned - CBC with Differential/Platelet; Future  5. Essential hypertension, benign -Blood pressure is good today and patient will continue with current treatment while watching sodium intake - BMP8+EGFR; Future - CBC with Differential/Platelet; Future - Hepatic function panel; Future  6. Vitamin D deficiency -Continue vitamin D replacement pending results of lab work - CBC with Differential/Platelet; Future - VITAMIN D 25 Hydroxy (Vit-D Deficiency, Fractures); Future  7. Thrombocytopenia ([place]) -No history of any bleeding issues.  8. Urge incontinence of urine -Follow-up with urology as planned  9. Family history of colon cancer -Get colonoscopy in September because  of family history of colon cancer  10. Melanoma of skin ([place]) -Follow-up with dermatology as planned and use skin protection as much as possible  Patient Instructions                       Medicare Annual Wellness Visit  [facility] and the medical providers at [facility] strive to bring you the best medical care.  In doing so we not only want to address your current medical conditions and concerns but also to detect new conditions early and prevent illness, disease and health-related problems.    Medicare offers a yearly Wellness Visit which allows our clinical staff to assess your need for preventative services including immunizations, lifestyle  education, counseling to decrease risk of preventable diseases and screening for fall risk and other medical concerns.    This visit is provided free of charge (no copay) for all Medicare recipients. The clinical pharmacists at [facility] have begun to conduct these Wellness Visits which will also include a thorough review of all your medications.    As you primary medical provider recommend that you make an appointment for your Annual Wellness Visit if you have not done so already this year.  You may set up this appointment before you leave today or you may call back ([phone]) and schedule an appointment.  Please make sure when you call that you mention that you are scheduling your Annual Wellness Visit with the clinical pharmacist so that the appointment may be made for the proper length of time.    Continue current medications. Continue good therapeutic lifestyle changes which include good diet and exercise. Fall precautions discussed with patient. If an FOBT was given today- please return it to our front desk. If you are over 5 years old - you may need Prevnar 53 or the adult Pneumonia vaccine.  **Flu shots are available--- please call and schedule a FLU-CLINIC appointment**  After your visit with [place] today you will receive a survey in the mail or online from [employer] regarding your care with [place]. Please take a moment to fill this out. Your feedback is very important to [place] as you can help [place] better understand your patient needs as well as improve your experience and satisfaction. WE CARE ABOUT YOU!!!   Follow-up with dermatology as planned Follow-up with urology as planned Do not forget to get your colonoscopy in September Continue to work on losing weight through diet and exercise Monitor blood sugars regularly Watch sodium intake  \  Arrie Senate MD

## 2017-07-17 NOTE — Patient Instructions (Addendum)
Medicare Annual Wellness Visit  [facility] and the medical providers at [facility] strive to bring you the best medical care.  In doing so we not only want to address your current medical conditions and concerns but also to detect new conditions early and prevent illness, disease and health-related problems.    Medicare offers a yearly Wellness Visit which allows our clinical staff to assess your need for preventative services including immunizations, lifestyle education, counseling to decrease risk of preventable diseases and screening for fall risk and other medical concerns.    This visit is provided free of charge (no copay) for all Medicare recipients. The clinical pharmacists at [facility] have begun to conduct these Wellness Visits which will also include a thorough review of all your medications.    As you primary medical provider recommend that you make an appointment for your Annual Wellness Visit if you have not done so already this year.  You may set up this appointment before you leave today or you may call back [phone]) and schedule an appointment.  Please make sure when you call that you mention that you are scheduling your Annual Wellness Visit with the clinical pharmacist so that the appointment may be made for the proper length of time.    Continue current medications. Continue good therapeutic lifestyle changes which include good diet and exercise. Fall precautions discussed with patient. If an FOBT was given today- please return it to our front desk. If you are over 68 years old - you may need Prevnar 97 or the adult Pneumonia vaccine.  **Flu shots are available--- please call and schedule a FLU-CLINIC appointment**  After your visit with [place] today you will receive a survey in the mail or online from [employer] regarding your care with [place]. Please take a moment to fill this out. Your feedback is very  important to [place] as you can help [place] better understand your patient needs as well as improve your experience and satisfaction. WE CARE ABOUT YOU!!!   Follow-up with dermatology as planned Follow-up with urology as planned Do not forget to get your colonoscopy in September Continue to work on losing weight through diet and exercise Monitor blood sugars regularly Watch sodium intake  \

## 2017-07-23 ENCOUNTER — Other Ambulatory Visit: Payer: Medicare HMO

## 2017-07-23 DIAGNOSIS — E559 Vitamin D deficiency, unspecified: Secondary | ICD-10-CM | POA: Diagnosis not present

## 2017-07-23 DIAGNOSIS — E785 Hyperlipidemia, unspecified: Secondary | ICD-10-CM

## 2017-07-23 DIAGNOSIS — E1122 Type 2 diabetes mellitus with diabetic chronic kidney disease: Secondary | ICD-10-CM | POA: Diagnosis not present

## 2017-07-23 DIAGNOSIS — I10 Essential (primary) hypertension: Secondary | ICD-10-CM

## 2017-07-23 DIAGNOSIS — I2510 Atherosclerotic heart disease of native coronary artery without angina pectoris: Secondary | ICD-10-CM

## 2017-07-23 DIAGNOSIS — N183 Chronic kidney disease, stage 3 unspecified: Secondary | ICD-10-CM

## 2017-07-23 LAB — BAYER DCA HB A1C WAIVED: HB A1C (BAYER DCA - WAIVED): 6.6 % (ref <7.0)

## 2017-07-24 LAB — CBC WITH DIFFERENTIAL/PLATELET
Basophils Absolute: 0 10*3/uL (ref 0.0–0.2)
Basos: 1 %
EOS (ABSOLUTE): 0.1 10*3/uL (ref 0.0–0.4)
Eos: 2 %
Hematocrit: 40 % (ref 37.5–51.0)
Hemoglobin: 13.4 g/dL (ref 13.0–17.7)
Immature Grans (Abs): 0 10*3/uL (ref 0.0–0.1)
Immature Granulocytes: 1 %
Lymphocytes Absolute: 2.3 10*3/uL (ref 0.7–3.1)
Lymphs: 35 %
MCH: 30.3 pg (ref 26.6–33.0)
MCHC: 33.5 g/dL (ref 31.5–35.7)
MCV: 91 fL (ref 79–97)
Monocytes Absolute: 0.5 10*3/uL (ref 0.1–0.9)
Monocytes: 7 %
Neutrophils Absolute: 3.6 10*3/uL (ref 1.4–7.0)
Neutrophils: 54 %
Platelets: 154 10*3/uL (ref 150–379)
RBC: 4.42 x10E6/uL (ref 4.14–5.80)
RDW: 14.4 % (ref 12.3–15.4)
WBC: 6.5 10*3/uL (ref 3.4–10.8)

## 2017-07-24 LAB — HEPATIC FUNCTION PANEL
ALT: 26 IU/L (ref 0–44)
AST: 29 IU/L (ref 0–40)
Albumin: 4.3 g/dL (ref 3.5–4.8)
Alkaline Phosphatase: 37 IU/L — ABNORMAL LOW (ref 39–117)
Bilirubin Total: 0.5 mg/dL (ref 0.0–1.2)
Bilirubin, Direct: 0.16 mg/dL (ref 0.00–0.40)
Total Protein: 7.2 g/dL (ref 6.0–8.5)

## 2017-07-24 LAB — BMP8+EGFR
BUN/Creatinine Ratio: 15 (ref 10–24)
BUN: 28 mg/dL — ABNORMAL HIGH (ref 8–27)
CO2: 19 mmol/L — ABNORMAL LOW (ref 20–29)
Calcium: 9.6 mg/dL (ref 8.6–10.2)
Chloride: 100 mmol/L (ref 96–106)
Creatinine, Ser: 1.85 mg/dL — ABNORMAL HIGH (ref 0.76–1.27)
GFR calc Af Amer: 40 mL/min/{1.73_m2} — ABNORMAL LOW (ref >59)
GFR calc non Af Amer: 35 mL/min/{1.73_m2} — ABNORMAL LOW (ref >59)
Glucose: 173 mg/dL — ABNORMAL HIGH (ref 65–99)
Potassium: 4.5 mmol/L (ref 3.5–5.2)
Sodium: 136 mmol/L (ref 134–144)

## 2017-07-24 LAB — LIPID PANEL
Chol/HDL Ratio: 9.2 ratio — ABNORMAL HIGH (ref 0.0–5.0)
Cholesterol, Total: 166 mg/dL (ref 100–199)
HDL: 18 mg/dL — ABNORMAL LOW (ref >39)
Triglycerides: 541 mg/dL — ABNORMAL HIGH (ref 0–149)

## 2017-07-24 LAB — VITAMIN D 25 HYDROXY (VIT D DEFICIENCY, FRACTURES): Vit D, 25-Hydroxy: 16.6 ng/mL — ABNORMAL LOW (ref 30.0–100.0)

## 2017-07-31 ENCOUNTER — Ambulatory Visit: Payer: Self-pay | Admitting: Pharmacist

## 2017-08-01 NOTE — Addendum Note (Signed)
Addended by: Cherre Robins B on: 08/01/2017 08:13 AM   Modules accepted: Orders

## 2017-08-09 ENCOUNTER — Ambulatory Visit: Payer: Medicare HMO | Admitting: Family Medicine

## 2017-08-28 DIAGNOSIS — R3915 Urgency of urination: Secondary | ICD-10-CM | POA: Diagnosis not present

## 2017-08-28 DIAGNOSIS — N3941 Urge incontinence: Secondary | ICD-10-CM | POA: Diagnosis not present

## 2017-08-28 DIAGNOSIS — N400 Enlarged prostate without lower urinary tract symptoms: Secondary | ICD-10-CM | POA: Diagnosis not present

## 2017-09-02 ENCOUNTER — Other Ambulatory Visit: Payer: Self-pay | Admitting: Family Medicine

## 2017-09-03 NOTE — Progress Notes (Signed)
HPI The patient presents for one year followup. Since I last saw him he has done OK.  He does wood work on [place] and does yard work including pushing a Therapist, music.  The patient denies any new symptoms such as chest discomfort, neck or arm discomfort. There has been no new shortness of breath, PND or orthopnea. There have been no reported palpitations, presyncope or syncope.  He does get SOB with activity but this has been chronic.  He has not needed any NTG.    Allergies  Allergen Reactions  . Hydrocodone-Acetaminophen Other (See Comments)    Makes pt. Feel loopy    Current Outpatient Prescriptions  Medication Sig Dispense Refill  . amLODipine-olmesartan (AZOR) 5-40 MG tablet TAKE 1 TABLET DAILY 90 tablet 1  . aspirin 81 MG EC tablet Take 81 mg by mouth daily.      . Choline Fenofibrate (FENOFIBRIC ACID) 135 MG CPDR TAKE (1) CAPSULE DAILY 90 capsule 1  . esomeprazole (NEXIUM) 40 MG capsule TAKE (1) CAPSULE DAILY 90 capsule 1  . glimepiride (AMARYL) 4 MG tablet TAKE (1) TABLET TWICE A DAY. 180 tablet 0  . hydrochlorothiazide (HYDRODIURIL) 25 MG tablet TAKE 1 TABLET DAILY 90 tablet 1  . JANUMET XR 50-500 MG TB24 TAKE 1 TABLET DAILY 90 tablet 0  . latanoprost (XALATAN) 0.005 % ophthalmic solution Place 1 drop into both eyes at bedtime.    . metoprolol succinate (TOPROL-XL) 50 MG 24 hr tablet TAKE 1 TABLET ONCE A DAY WITH A MEAL 90 tablet 1  . MYRBETRIQ 50 MG TB24 tablet Take 50 mg by mouth daily.    . nitroGLYCERIN (NITROSTAT) 0.4 MG SL tablet Place 1 tablet (0.4 mg total) under the tongue every 5 (five) minutes as needed. 25 tablet 3  . rosuvastatin (CRESTOR) 10 MG tablet TAKE 1 TABLET DAILY 90 tablet 1  . VASCEPA 1 g CAPS TAKE (2) CAPSULES TWICE DAILY. 120 capsule 5   No current facility-administered medications for this visit.     Past Medical History:  Diagnosis Date  . Cataract   . Chronic kidney disease   . Colon polyp   . Coronary artery disease    (left main normal,  LAD 25-30% stenosis, distal 30-40% stenosis, circumflex obtuse marginal 50% stenosis,  right coronary artery dominant with long 75% followed by mid 80%  stenosis followed by 9% stenosis at the ostium of the PDA.  He had  stenting of the PDA by Dr. [name].  This was a Cypher stent.   First angioplasty was 1994, Cath 2011 100% RCA ).      . Diabetes mellitus without complication ([place])   . Essential hypertension, benign   . Glaucoma   . Hyperplasia of prostate   . 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 OSA (obstructive sleep apnea) 08/17/2016    Past Surgical History:  Procedure Laterality Date  . androplasty    . angioplasty with 3 stents    . APPENDECTOMY    . bilateral hernia repair    . CERVICAL SPINE SURGERY    . CHOLECYSTECTOMY    . cholestectomy    . EYE SURGERY Bilateral    cataracts  . SKIN CANCER EXCISION  03/17/2017    ROS:  As stated in the HPI and negative for all other systems.  PHYSICAL EXAM BP 122/66   Pulse 83   Ht 5' 7.5" (1.715 m)   Wt 194 lb (88 kg)   BMI  29.94 kg/m   GENERAL:  Well appearing NECK:  No jugular venous distention, waveform within normal limits, carotid upstroke brisk and symmetric, no bruits, no thyromegaly LUNGS:  Clear to auscultation bilaterally CHEST:  Unremarkable HEART:  PMI not displaced or sustained,S1 and S2 within normal limits, no S3, no S4, no clicks, no rubs, no murmurs ABD:  Flat, positive bowel sounds normal in frequency in pitch, no bruits, no rebound, no guarding, no midline pulsatile mass, no hepatomegaly, no splenomegaly EXT:  2 plus pulses throughout, no edema, no cyanosis no clubbing   EKG: Sinus rhythm, rate 80, axis within normal limits, intervals within normal limits, nonspecific ST-T wave changes, early transition in lead V2 , no change from previous. PVCs  09/05/2017   ASSESSMENT AND PLAN  CAD -  The patient has no new sypmtoms.  No further cardiovascular testing is indicated.  We will  continue with aggressive risk reduction and meds as listed.  Essential hypertension, benign -  The blood pressure is at target. No change in medications is indicated. We will continue with therapeutic lifestyle changes (TLC).  DYSLIPIDEMIA -  His triglycerides are elevated.  However, he had trouble tolerating niacin in the past.  I think diet would be the better therapy and he could do better.  We talked about this at length.   SLEEP APNEA - Unfortunately he could not afford CPAP.   I have prescribed weight loss.    CKD STAGE III - Creat is 1.85.  This has been stable. No change in follow up.

## 2017-09-04 ENCOUNTER — Encounter: Payer: Self-pay | Admitting: Cardiology

## 2017-09-04 ENCOUNTER — Ambulatory Visit (INDEPENDENT_AMBULATORY_CARE_PROVIDER_SITE_OTHER): Payer: Medicare HMO | Admitting: Cardiology

## 2017-09-04 VITALS — BP 122/66 | HR 83 | Ht 67.5 in | Wt 194.0 lb

## 2017-09-04 DIAGNOSIS — E785 Hyperlipidemia, unspecified: Secondary | ICD-10-CM

## 2017-09-04 DIAGNOSIS — I2510 Atherosclerotic heart disease of native coronary artery without angina pectoris: Secondary | ICD-10-CM | POA: Diagnosis not present

## 2017-09-04 DIAGNOSIS — I10 Essential (primary) hypertension: Secondary | ICD-10-CM | POA: Diagnosis not present

## 2017-09-04 NOTE — Patient Instructions (Signed)

## 2017-09-05 ENCOUNTER — Encounter: Payer: Self-pay | Admitting: Cardiology

## 2017-09-16 ENCOUNTER — Other Ambulatory Visit: Payer: Self-pay | Admitting: Family Medicine

## 2017-09-17 ENCOUNTER — Other Ambulatory Visit: Payer: Self-pay | Admitting: Family Medicine

## 2017-09-25 ENCOUNTER — Ambulatory Visit (INDEPENDENT_AMBULATORY_CARE_PROVIDER_SITE_OTHER): Payer: Medicare HMO

## 2017-09-25 DIAGNOSIS — Z23 Encounter for immunization: Secondary | ICD-10-CM | POA: Diagnosis not present

## 2017-10-12 ENCOUNTER — Other Ambulatory Visit: Payer: Self-pay | Admitting: Family Medicine

## 2017-11-12 ENCOUNTER — Encounter: Payer: Self-pay | Admitting: Internal Medicine

## 2017-12-02 ENCOUNTER — Other Ambulatory Visit: Payer: Self-pay | Admitting: Family Medicine

## 2017-12-04 ENCOUNTER — Ambulatory Visit: Payer: Medicare HMO | Admitting: Family Medicine

## 2017-12-04 ENCOUNTER — Encounter: Payer: Self-pay | Admitting: Family Medicine

## 2017-12-04 VITALS — BP 129/53 | HR 74 | Temp 96.6°F | Ht 67.5 in | Wt 191.0 lb

## 2017-12-04 DIAGNOSIS — N183 Chronic kidney disease, stage 3 unspecified: Secondary | ICD-10-CM

## 2017-12-04 DIAGNOSIS — D696 Thrombocytopenia, unspecified: Secondary | ICD-10-CM | POA: Diagnosis not present

## 2017-12-04 DIAGNOSIS — I2510 Atherosclerotic heart disease of native coronary artery without angina pectoris: Secondary | ICD-10-CM

## 2017-12-04 DIAGNOSIS — E1122 Type 2 diabetes mellitus with diabetic chronic kidney disease: Secondary | ICD-10-CM | POA: Diagnosis not present

## 2017-12-04 DIAGNOSIS — I10 Essential (primary) hypertension: Secondary | ICD-10-CM | POA: Diagnosis not present

## 2017-12-04 DIAGNOSIS — E785 Hyperlipidemia, unspecified: Secondary | ICD-10-CM

## 2017-12-04 DIAGNOSIS — E559 Vitamin D deficiency, unspecified: Secondary | ICD-10-CM

## 2017-12-04 LAB — BAYER DCA HB A1C WAIVED: HB A1C (BAYER DCA - WAIVED): 6.1 % (ref <7.0)

## 2017-12-04 NOTE — Progress Notes (Signed)
Subjective:    Patient ID: Timothy [name], male    DOB: 11-23-1941, 76 y.o.   MRN: [id number]  HPI Pt here for follow up and management of chronic medical problems which includes diabetes, hyperlipidemia and hypertension. He is taking medication regularly.  The patient is doing well today with no specific complaints.  He is due to return in FOBT and get lab work today.  His vital signs are stable his weight is down 3 pounds from the previous visit.  This patient has a history of sleep apnea thrombocytopenia elevated triglycerides and chronic kidney disease along with hypertension and hyperlipidemia.  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  He is due to have an eye exam by Dr. Carolynn Sayers in March.  He has seen the cardiologist whom he sees yearly and he saw him this fall and he will see him back again and everything is stable from the cardiac standpoint.  He also sees the urologist because of some leaking and incontinence and Myrbetriq has helped this and he will see him back again every 3 months.  The patient denies any chest pain pressure or tightness.  He denies any trouble with swallowing nausea vomiting diarrhea blood in the stool black tarry bowel movements or change in bowel habits.  He does have heartburn and he tried to reduce his proton pump inhibitor to every other day but was not able to do this and will go back to 1 daily because of increasing heartburn on the every other day dosing.  He is doing better with controlling his urine.  He looks good and has no complaints today.  He is taking the vascepa and taking 1 twice daily.  We may want to increase this to 2 twice daily but will wait until his lab work is returned first.  He says that his blood sugars fasting usually run in the mid 130 range.    Patient Active Problem List   Diagnosis Date Noted  . Thrombocytopenia ([place]) 09/23/2016  . OSA (obstructive sleep apnea) 08/17/2016  . Abnormal  serum lipase level 10/18/2015  . Elevated triglycerides with high cholesterol 10/18/2015  . CKD (chronic kidney disease) stage 3, GFR 30-59 ml/min (HCC) 05/13/2015  . Glaucoma 12/27/2014  . Overweight(278.02) 06/27/2011  . ASCVD (arteriosclerotic cardiovascular disease)   . Essential hypertension, benign   . Hyperplasia of prostate   . 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 Colon polyp   . Type 2 diabetes mellitus with diabetic nephropathy ([place]) 05/24/2010  . Hyperlipidemia LDL goal <70 05/13/2009  . Coronary atherosclerosis 05/13/2009   Outpatient Encounter Medications as of 12/04/2017  Medication Sig  . amLODipine-olmesartan (AZOR) 5-40 MG tablet TAKE 1 TABLET DAILY  . aspirin 81 MG EC tablet Take 81 mg by mouth daily.    . Choline Fenofibrate (FENOFIBRIC ACID) 135 MG CPDR TAKE (1) CAPSULE DAILY  . esomeprazole (NEXIUM) 40 MG capsule TAKE (1) CAPSULE DAILY  . glimepiride (AMARYL) 4 MG tablet TAKE (1) TABLET TWICE A DAY.  . hydrochlorothiazide (HYDRODIURIL) 25 MG tablet TAKE 1 TABLET DAILY  . JANUMET XR 50-500 MG TB24 TAKE 1 TABLET DAILY  . latanoprost (XALATAN) 0.005 % ophthalmic solution Place 1 drop into both eyes at bedtime.  . metoprolol succinate (TOPROL-XL) 50 MG 24 hr tablet TAKE 1 TABLET ONCE A DAY WITH A MEAL  . MYRBETRIQ 50 MG TB24 tablet Take 50 mg by mouth daily.  Marland Kitchen  nitroGLYCERIN (NITROSTAT) 0.4 MG SL tablet Place 1 tablet (0.4 mg total) under the tongue every 5 (five) minutes as needed.  . rosuvastatin (CRESTOR) 10 MG tablet TAKE 1 TABLET DAILY  . VASCEPA 1 g CAPS TAKE (2) CAPSULES TWICE DAILY.  . [DISCONTINUED] JANUMET XR 50-500 MG TB24 TAKE 1 TABLET DAILY   No facility-administered encounter medications on file as of 12/04/2017.       Review of Systems  Constitutional: Negative.   HENT: Negative.   Eyes: Negative.   Respiratory: Negative.   Cardiovascular: Negative.   Gastrointestinal: Negative.   Endocrine: Negative.     Genitourinary: Negative.   Musculoskeletal: Negative.   Skin: Negative.   Allergic/Immunologic: Negative.   Neurological: Negative.   Hematological: Negative.   Psychiatric/Behavioral: Negative.        Objective:   Physical Exam  Constitutional: He is oriented to person, place, and time. He appears well-developed and well-nourished. No distress.  The patient is pleasant and relaxed and feeling well  HENT:  Head: Normocephalic and atraumatic.  Right Ear: External ear normal.  Left Ear: External ear normal.  Nose: Nose normal.  Mouth/Throat: Oropharynx is clear and moist. No oropharyngeal exudate.  Eyes: Conjunctivae and EOM are normal. Pupils are equal, round, and reactive to light. Right eye exhibits no discharge. Left eye exhibits no discharge. No scleral icterus.  Neck: Normal range of motion. Neck supple. No thyromegaly present.  No bruits thyromegaly or anterior cervical adenopathy  Cardiovascular: Normal rate, regular rhythm, normal heart sounds and intact distal pulses.  No murmur heard. The heart has a regular rate and rhythm at 72/min  Pulmonary/Chest: Effort normal and breath sounds normal. No respiratory distress. He has no wheezes. He has no rales. He exhibits no tenderness.  Clear anteriorly and posteriorly and no axillary adenopathy and no chest wall masses  Abdominal: Soft. Bowel sounds are normal. He exhibits no mass. There is no tenderness. There is no rebound and no guarding.  The abdomen is soft without liver or spleen enlargement masses bruits or inguinal adenopathy  Genitourinary:  Genitourinary Comments: The patient sees the urologist who has you on Myrbetriq about every 3 months.  Musculoskeletal: Normal range of motion. He exhibits no edema.  Lymphadenopathy:    He has no cervical adenopathy.  Neurological: He is alert and oriented to person, place, and time. He has normal reflexes. No cranial nerve deficit.  Skin: Skin is warm and dry. No rash noted.   Somewhat dry skin in general  Psychiatric: He has a normal mood and affect. His behavior is normal. Judgment and thought content normal.  Nursing note and vitals reviewed.   BP (!) 129/53 (BP Location: Left Arm)   Pulse 74   Temp (!) 96.6 F (35.9 C) (Oral)   Ht 5' 7.5" (1.715 m)   Wt 191 lb (86.6 kg)   BMI 29.47 kg/m        Assessment & Plan:  1. Type 2 diabetes mellitus with stage 3 chronic kidney disease, without long-term current use of insulin (HCC) -Continue to check blood sugars regularly and most important stay active physically and eat less sugar and drink more water - BMP8+EGFR - CBC with Differential/Platelet - Bayer DCA Hb A1c Waived  2. Hyperlipidemia LDL goal <70 -Continue current treatment pending results of lab work - Lipid panel - CBC with Differential/Platelet  3. CKD (chronic kidney disease) stage 3, GFR 30-59 ml/min (HCC) -Continue to avoid all NSAIDs like ibuprofen and Aleve and keep blood pressure  under the best control possible - BMP8+EGFR - CBC with Differential/Platelet  4. ASCVD (arteriosclerotic cardiovascular disease) -Follow-up with cardiology as planned - Lipid panel - CBC with Differential/Platelet  5. Essential hypertension, benign -The blood pressure is good today and he will continue with current treatment - BMP8+EGFR - Hepatic function panel - CBC with Differential/Platelet  6. Vitamin D deficiency -To new current treatment pending results of lab work - VITAMIN D 25 Hydroxy (Vit-D Deficiency, Fractures) - CBC with Differential/Platelet  7. Thrombocytopenia ([place]) -No complaints today of bleeding issues. - CBC with Differential/Platelet  No orders of the defined types were placed in this encounter.  Patient Instructions                       Medicare Annual Wellness Visit  [facility] and the medical providers at [facility] strive to bring you the best medical care.  In doing so we not only want to  address your current medical conditions and concerns but also to detect new conditions early and prevent illness, disease and health-related problems.    Medicare offers a yearly Wellness Visit which allows our clinical staff to assess your need for preventative services including immunizations, lifestyle education, counseling to decrease risk of preventable diseases and screening for fall risk and other medical concerns.    This visit is provided free of charge (no copay) for all Medicare recipients. The clinical pharmacists at [facility] have begun to conduct these Wellness Visits which will also include a thorough review of all your medications.    As you primary medical provider recommend that you make an appointment for your Annual Wellness Visit if you have not done so already this year.  You may set up this appointment before you leave today or you may call back ([phone]) and schedule an appointment.  Please make sure when you call that you mention that you are scheduling your Annual Wellness Visit with the clinical pharmacist so that the appointment may be made for the proper length of time.     Continue current medications. Continue good therapeutic lifestyle changes which include good diet and exercise. Fall precautions discussed with patient. If an FOBT was given today- please return it to our front desk. If you are over 12 years old - you may need Prevnar 87 or the adult Pneumonia vaccine.  **Flu shots are available--- please call and schedule a FLU-CLINIC appointment**  After your visit with [place] today you will receive a survey in the mail or online from [employer] regarding your care with [place]. Please take a moment to fill this out. Your feedback is very important to [place] as you can help [place] better understand your patient needs as well as improve your experience and satisfaction. WE CARE ABOUT YOU!!!   Drink plenty of fluids stay away from sugar and get as much  exercise as possible Keep the house as cool as possible this winter and do not use any forced air overhead like fans Do not forget your appointment for your eye exam in the spring  Arrie Senate MD

## 2017-12-04 NOTE — Patient Instructions (Addendum)
Medicare Annual Wellness Visit  [facility] and the medical providers at [facility] strive to bring you the best medical care.  In doing so we not only want to address your current medical conditions and concerns but also to detect new conditions early and prevent illness, disease and health-related problems.    Medicare offers a yearly Wellness Visit which allows our clinical staff to assess your need for preventative services including immunizations, lifestyle education, counseling to decrease risk of preventable diseases and screening for fall risk and other medical concerns.    This visit is provided free of charge (no copay) for all Medicare recipients. The clinical pharmacists at [facility] have begun to conduct these Wellness Visits which will also include a thorough review of all your medications.    As you primary medical provider recommend that you make an appointment for your Annual Wellness Visit if you have not done so already this year.  You may set up this appointment before you leave today or you may call back ([phone]) and schedule an appointment.  Please make sure when you call that you mention that you are scheduling your Annual Wellness Visit with the clinical pharmacist so that the appointment may be made for the proper length of time.     Continue current medications. Continue good therapeutic lifestyle changes which include good diet and exercise. Fall precautions discussed with patient. If an FOBT was given today- please return it to our front desk. If you are over 62 years old - you may need Prevnar 57 or the adult Pneumonia vaccine.  **Flu shots are available--- please call and schedule a FLU-CLINIC appointment**  After your visit with [place] today you will receive a survey in the mail or online from [employer] regarding your care with [place]. Please take a moment to fill this out. Your feedback is very  important to [place] as you can help [place] better understand your patient needs as well as improve your experience and satisfaction. WE CARE ABOUT YOU!!!   Drink plenty of fluids stay away from sugar and get as much exercise as possible Keep the house as cool as possible this winter and do not use any forced air overhead like fans Do not forget your appointment for your eye exam in the spring

## 2017-12-05 ENCOUNTER — Telehealth: Payer: Self-pay | Admitting: *Deleted

## 2017-12-05 LAB — LIPID PANEL
Chol/HDL Ratio: 9.3 ratio — ABNORMAL HIGH (ref 0.0–5.0)
Cholesterol, Total: 148 mg/dL (ref 100–199)
HDL: 16 mg/dL — ABNORMAL LOW (ref >39)
Triglycerides: 581 mg/dL (ref 0–149)

## 2017-12-05 LAB — HEPATIC FUNCTION PANEL
ALT: 24 IU/L (ref 0–44)
AST: 29 IU/L (ref 0–40)
Albumin: 4.3 g/dL (ref 3.5–4.8)
Alkaline Phosphatase: 54 IU/L (ref 39–117)
Bilirubin Total: 0.2 mg/dL (ref 0.0–1.2)
Bilirubin, Direct: 0.12 mg/dL (ref 0.00–0.40)
Total Protein: 7.2 g/dL (ref 6.0–8.5)

## 2017-12-05 LAB — CBC WITH DIFFERENTIAL/PLATELET
Basophils Absolute: 0 10*3/uL (ref 0.0–0.2)
Basos: 1 %
EOS (ABSOLUTE): 0.1 10*3/uL (ref 0.0–0.4)
Eos: 2 %
Hematocrit: 39.3 % (ref 37.5–51.0)
Hemoglobin: 13 g/dL (ref 13.0–17.7)
Immature Grans (Abs): 0 10*3/uL (ref 0.0–0.1)
Immature Granulocytes: 1 %
Lymphocytes Absolute: 1.7 10*3/uL (ref 0.7–3.1)
Lymphs: 31 %
MCH: 30.7 pg (ref 26.6–33.0)
MCHC: 33.1 g/dL (ref 31.5–35.7)
MCV: 93 fL (ref 79–97)
Monocytes Absolute: 0.5 10*3/uL (ref 0.1–0.9)
Monocytes: 8 %
Neutrophils Absolute: 3.3 10*3/uL (ref 1.4–7.0)
Neutrophils: 57 %
Platelets: 155 10*3/uL (ref 150–379)
RBC: 4.24 x10E6/uL (ref 4.14–5.80)
RDW: 14.1 % (ref 12.3–15.4)
WBC: 5.6 10*3/uL (ref 3.4–10.8)

## 2017-12-05 LAB — BMP8+EGFR
BUN/Creatinine Ratio: 13 (ref 10–24)
BUN: 22 mg/dL (ref 8–27)
CO2: 18 mmol/L — ABNORMAL LOW (ref 20–29)
Calcium: 9.4 mg/dL (ref 8.6–10.2)
Chloride: 106 mmol/L (ref 96–106)
Creatinine, Ser: 1.76 mg/dL — ABNORMAL HIGH (ref 0.76–1.27)
GFR calc Af Amer: 42 mL/min/{1.73_m2} — ABNORMAL LOW (ref >59)
GFR calc non Af Amer: 37 mL/min/{1.73_m2} — ABNORMAL LOW (ref >59)
Glucose: 151 mg/dL — ABNORMAL HIGH (ref 65–99)
Potassium: 4.2 mmol/L (ref 3.5–5.2)
Sodium: 143 mmol/L (ref 134–144)

## 2017-12-05 LAB — VITAMIN D 25 HYDROXY (VIT D DEFICIENCY, FRACTURES): Vit D, 25-Hydroxy: 14.7 ng/mL — ABNORMAL LOW (ref 30.0–100.0)

## 2017-12-05 NOTE — Telephone Encounter (Signed)
Pt aware.

## 2017-12-23 ENCOUNTER — Other Ambulatory Visit: Payer: Self-pay | Admitting: Family Medicine

## 2018-01-15 ENCOUNTER — Encounter: Payer: Self-pay | Admitting: Family Medicine

## 2018-01-15 ENCOUNTER — Ambulatory Visit (INDEPENDENT_AMBULATORY_CARE_PROVIDER_SITE_OTHER): Payer: Medicare HMO | Admitting: Family Medicine

## 2018-01-15 VITALS — BP 118/59 | HR 96 | Temp 101.0°F | Ht 67.5 in | Wt 194.0 lb

## 2018-01-15 DIAGNOSIS — R509 Fever, unspecified: Secondary | ICD-10-CM

## 2018-01-15 DIAGNOSIS — J111 Influenza due to unidentified influenza virus with other respiratory manifestations: Secondary | ICD-10-CM | POA: Diagnosis not present

## 2018-01-15 LAB — VERITOR FLU A/B WAIVED
Influenza A: POSITIVE — AB
Influenza B: NEGATIVE

## 2018-01-15 MED ORDER — BENZONATATE 200 MG PO CAPS: 200.0000 mg | ORAL_CAPSULE | Freq: Three times a day (TID) | ORAL | 0 refills | Status: DC | PRN

## 2018-01-15 MED ORDER — OSELTAMIVIR PHOSPHATE 75 MG PO CAPS: 75.0000 mg | ORAL_CAPSULE | Freq: Two times a day (BID) | ORAL | 0 refills | Status: DC

## 2018-01-15 NOTE — Progress Notes (Signed)
Chief Complaint  Patient presents with  . Cough    pt here today c/o cough, legs hurting and fever of 99.7     HPI  Patient presents today for patient presents with dry cough runny stuffy nose. Diffuse headache of moderate intensity. Patient also has chills and subjective fever. Body aches worst in the  legs, shoulders, and torso as well. Has sapped the energy to the point that of being unable to perform usual activities other than ADLs. Onset yesterday after lunch.  Coughs so bad last night he kept his wife awake.  PMH: Smoking status noted ROS: Per HPI  Objective: BP (!) 118/59   Pulse 96   Temp (!) 101 F (38.3 C) (Oral)   Ht 5' 7.5" (1.715 m)   Wt 194 lb (88 kg)   BMI 29.94 kg/m  Gen: NAD, alert, cooperative with exam HEENT: NCAT, EOMI, PERRL CV: RRR, good S1/S2, no murmur Resp: CTABL, no wheezes, non-labored Abd: SNTND, BS present, no guarding or organomegaly Ext: No edema, warm Neuro: Alert and oriented, No gross deficits  Assessment and plan:  1. Influenza with respiratory manifestation   2. Fever in adult     Meds ordered this encounter  Medications  . oseltamivir (TAMIFLU) 75 MG capsule    Sig: Take 1 capsule (75 mg total) by mouth 2 (two) times daily.    Dispense:  10 capsule    Refill:  0  . benzonatate (TESSALON) 200 MG capsule    Sig: Take 1 capsule (200 mg total) by mouth 3 (three) times daily as needed for cough.    Dispense:  20 capsule    Refill:  0    Orders Placed This Encounter  Procedures  . Veritor Flu A/B Waived    Order Specific Question:   Source    Answer:   fever    Follow up as needed.  Claretta Fraise, MD

## 2018-01-27 DIAGNOSIS — I2510 Atherosclerotic heart disease of native coronary artery without angina pectoris: Secondary | ICD-10-CM | POA: Diagnosis not present

## 2018-01-27 DIAGNOSIS — I252 Old myocardial infarction: Secondary | ICD-10-CM | POA: Diagnosis not present

## 2018-01-27 DIAGNOSIS — I10 Essential (primary) hypertension: Secondary | ICD-10-CM | POA: Diagnosis not present

## 2018-01-27 DIAGNOSIS — E1165 Type 2 diabetes mellitus with hyperglycemia: Secondary | ICD-10-CM | POA: Diagnosis not present

## 2018-01-27 DIAGNOSIS — N3281 Overactive bladder: Secondary | ICD-10-CM | POA: Diagnosis not present

## 2018-01-27 DIAGNOSIS — K219 Gastro-esophageal reflux disease without esophagitis: Secondary | ICD-10-CM | POA: Diagnosis not present

## 2018-01-27 DIAGNOSIS — R32 Unspecified urinary incontinence: Secondary | ICD-10-CM | POA: Diagnosis not present

## 2018-01-27 DIAGNOSIS — Z720 Tobacco use: Secondary | ICD-10-CM | POA: Diagnosis not present

## 2018-01-27 DIAGNOSIS — E785 Hyperlipidemia, unspecified: Secondary | ICD-10-CM | POA: Diagnosis not present

## 2018-01-27 DIAGNOSIS — H409 Unspecified glaucoma: Secondary | ICD-10-CM | POA: Diagnosis not present

## 2018-02-22 ENCOUNTER — Other Ambulatory Visit: Payer: Self-pay | Admitting: Family Medicine

## 2018-03-03 ENCOUNTER — Other Ambulatory Visit: Payer: Self-pay | Admitting: Family Medicine

## 2018-03-06 DIAGNOSIS — H2589 Other age-related cataract: Secondary | ICD-10-CM | POA: Diagnosis not present

## 2018-03-06 DIAGNOSIS — E119 Type 2 diabetes mellitus without complications: Secondary | ICD-10-CM | POA: Diagnosis not present

## 2018-03-06 DIAGNOSIS — H40053 Ocular hypertension, bilateral: Secondary | ICD-10-CM | POA: Diagnosis not present

## 2018-03-06 DIAGNOSIS — Z961 Presence of intraocular lens: Secondary | ICD-10-CM | POA: Diagnosis not present

## 2018-03-06 LAB — HM DIABETES EYE EXAM

## 2018-03-26 ENCOUNTER — Other Ambulatory Visit: Payer: Self-pay | Admitting: Family Medicine

## 2018-04-22 ENCOUNTER — Encounter: Payer: Self-pay | Admitting: Family Medicine

## 2018-04-22 ENCOUNTER — Ambulatory Visit (INDEPENDENT_AMBULATORY_CARE_PROVIDER_SITE_OTHER): Payer: Medicare HMO

## 2018-04-22 ENCOUNTER — Ambulatory Visit: Payer: Medicare HMO | Admitting: Family Medicine

## 2018-04-22 ENCOUNTER — Encounter: Payer: Self-pay | Admitting: Internal Medicine

## 2018-04-22 VITALS — BP 128/64 | HR 71 | Temp 97.0°F | Ht 67.5 in | Wt 189.0 lb

## 2018-04-22 DIAGNOSIS — E559 Vitamin D deficiency, unspecified: Secondary | ICD-10-CM | POA: Diagnosis not present

## 2018-04-22 DIAGNOSIS — N400 Enlarged prostate without lower urinary tract symptoms: Secondary | ICD-10-CM | POA: Diagnosis not present

## 2018-04-22 DIAGNOSIS — E1122 Type 2 diabetes mellitus with diabetic chronic kidney disease: Secondary | ICD-10-CM

## 2018-04-22 DIAGNOSIS — C439 Malignant melanoma of skin, unspecified: Secondary | ICD-10-CM | POA: Diagnosis not present

## 2018-04-22 DIAGNOSIS — N183 Chronic kidney disease, stage 3 unspecified: Secondary | ICD-10-CM

## 2018-04-22 DIAGNOSIS — Z0000 Encounter for general adult medical examination without abnormal findings: Secondary | ICD-10-CM

## 2018-04-22 DIAGNOSIS — E1121 Type 2 diabetes mellitus with diabetic nephropathy: Secondary | ICD-10-CM

## 2018-04-22 DIAGNOSIS — D696 Thrombocytopenia, unspecified: Secondary | ICD-10-CM | POA: Diagnosis not present

## 2018-04-22 DIAGNOSIS — I10 Essential (primary) hypertension: Secondary | ICD-10-CM

## 2018-04-22 DIAGNOSIS — Z1211 Encounter for screening for malignant neoplasm of colon: Secondary | ICD-10-CM

## 2018-04-22 DIAGNOSIS — E785 Hyperlipidemia, unspecified: Secondary | ICD-10-CM

## 2018-04-22 DIAGNOSIS — I2510 Atherosclerotic heart disease of native coronary artery without angina pectoris: Secondary | ICD-10-CM

## 2018-04-22 LAB — URINALYSIS, COMPLETE
Bilirubin, UA: NEGATIVE
Ketones, UA: NEGATIVE
Leukocytes, UA: NEGATIVE
Nitrite, UA: NEGATIVE
Specific Gravity, UA: 1.02 (ref 1.005–1.030)
Urobilinogen, Ur: 0.2 mg/dL (ref 0.2–1.0)
pH, UA: 5 (ref 5.0–7.5)

## 2018-04-22 LAB — MICROSCOPIC EXAMINATION
Bacteria, UA: NONE SEEN
Epithelial Cells (non renal): NONE SEEN /hpf (ref 0–10)
Renal Epithel, UA: NONE SEEN /hpf
WBC, UA: NONE SEEN /hpf (ref 0–5)

## 2018-04-22 LAB — BAYER DCA HB A1C WAIVED: HB A1C (BAYER DCA - WAIVED): 6.8 % (ref <7.0)

## 2018-04-22 NOTE — Patient Instructions (Addendum)
Medicare Annual Wellness Visit  [facility] and the medical providers at [facility] strive to bring you the best medical care.  In doing so we not only want to address your current medical conditions and concerns but also to detect new conditions early and prevent illness, disease and health-related problems.    Medicare offers a yearly Wellness Visit which allows our clinical staff to assess your need for preventative services including immunizations, lifestyle education, counseling to decrease risk of preventable diseases and screening for fall risk and other medical concerns.    This visit is provided free of charge (no copay) for all Medicare recipients. The clinical pharmacists at [facility] have begun to conduct these Wellness Visits which will also include a thorough review of all your medications.    As you primary medical provider recommend that you make an appointment for your Annual Wellness Visit if you have not done so already this year.  You may set up this appointment before you leave today or you may call back ([phone]) and schedule an appointment.  Please make sure when you call that you mention that you are scheduling your Annual Wellness Visit with the clinical pharmacist so that the appointment may be made for the proper length of time.     Continue current medications. Continue good therapeutic lifestyle changes which include good diet and exercise. Fall precautions discussed with patient. If an FOBT was given today- please return it to our front desk. If you are over 10 years old - you may need Prevnar 44 or the adult Pneumonia vaccine.  **Flu shots are available--- please call and schedule a FLU-CLINIC appointment**  After your visit with [place] today you will receive a survey in the mail or online from [employer] regarding your care with [place]. Please take a moment to fill this out. Your feedback is very  important to [place] as you can help [place] better understand your patient needs as well as improve your experience and satisfaction. WE CARE ABOUT YOU!!!  Stay active physically and continue to watch her diet and lose weight through exercise and dieting Drink plenty of fluids and stay well-hydrated  Continue to follow-up regularly with dermatology because of past history of melanoma.

## 2018-04-22 NOTE — Progress Notes (Signed)
Subjective:    Patient ID: Timothy [name], male    DOB: 10-May-1941, 77 y.o.   MRN: [id number]  HPI Pt here for follow up and management of chronic medical problems which includes diabetes, hyperlipidemia and hypertension. He is taking medication regularly.  This patient is doing well overall with no specific complaints.  He is due to get his rectal exam and PSA today and a chest x-ray.  We will also get a urinalysis.  He has diabetes and hyperlipidemia with an elevated triglyceride.  Patient is doing well overall.  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  He denies any chest pain pressure tightness or shortness of breath.  He does see the cardiologist yearly in the summertime because of increased risk factors for heart disease including hyperlipidemia and triglycerides and diabetes.  I encouraged him to keep doing this.  He denies any trouble with his stomach including nausea vomiting diarrhea blood in the stool black tarry bowel movements reflux or change in bowel habits.  He continues to take the Nexium regularly and this seems to keep everything under control.  He is passing his water well but does use Myrbetriq which she gets from the urologist whom he sees yearly.  He says this is very expensive for him.  He is several months past due on his colonoscopy but because of a change in providers.  We will try to set him up an appointment as soon as possible because of a positive family history for colon cancer.   Patient Active Problem List   Diagnosis Date Noted  . Thrombocytopenia ([place]) 09/23/2016  . OSA (obstructive sleep apnea) 08/17/2016  . Abnormal serum lipase level 10/18/2015  . Elevated triglycerides with high cholesterol 10/18/2015  . CKD (chronic kidney disease) stage 3, GFR 30-59 ml/min (HCC) 05/13/2015  . Glaucoma 12/27/2014  . Overweight(278.02) 06/27/2011  . ASCVD (arteriosclerotic cardiovascular disease)   . Essential hypertension, benign   .  Hyperplasia of prostate   . 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 Colon polyp   . Type 2 diabetes mellitus with diabetic nephropathy ([place]) 05/24/2010  . Hyperlipidemia LDL goal <70 05/13/2009  . Coronary atherosclerosis 05/13/2009   Outpatient Encounter Medications as of 04/22/2018  Medication Sig  . amLODipine-olmesartan (AZOR) 5-40 MG tablet TAKE 1 TABLET DAILY  . aspirin 81 MG EC tablet Take 81 mg by mouth daily.    . Choline Fenofibrate (FENOFIBRIC ACID) 135 MG CPDR TAKE (1) CAPSULE DAILY  . esomeprazole (NEXIUM) 40 MG capsule TAKE (1) CAPSULE DAILY  . glimepiride (AMARYL) 4 MG tablet TAKE (1) TABLET TWICE A DAY.  . hydrochlorothiazide (HYDRODIURIL) 25 MG tablet TAKE 1 TABLET DAILY  . JANUMET XR 50-500 MG TB24 TAKE 1 TABLET DAILY  . latanoprost (XALATAN) 0.005 % ophthalmic solution Place 1 drop into both eyes at bedtime.  . metoprolol succinate (TOPROL-XL) 50 MG 24 hr tablet TAKE 1 TABLET ONCE A DAY WITH A MEAL  . MYRBETRIQ 50 MG TB24 tablet Take 50 mg by mouth daily.  . nitroGLYCERIN (NITROSTAT) 0.4 MG SL tablet Place 1 tablet (0.4 mg total) under the tongue every 5 (five) minutes as needed.  . rosuvastatin (CRESTOR) 10 MG tablet TAKE 1 TABLET DAILY  . VASCEPA 1 g CAPS TAKE (2) CAPSULES TWICE DAILY.  . [DISCONTINUED] benzonatate (TESSALON) 200 MG capsule Take 1 capsule (200 mg total) by mouth 3 (three) times daily as needed for cough.  . [DISCONTINUED] Carlyn Reichert  XR 50-500 MG TB24 TAKE 1 TABLET DAILY  . [DISCONTINUED] oseltamivir (TAMIFLU) 75 MG capsule Take 1 capsule (75 mg total) by mouth 2 (two) times daily.   No facility-administered encounter medications on file as of 04/22/2018.       Review of Systems  Constitutional: Negative.   HENT: Negative.   Eyes: Negative.   Respiratory: Negative.   Cardiovascular: Negative.   Gastrointestinal: Negative.   Endocrine: Negative.   Genitourinary: Negative.   Musculoskeletal: Negative.   Skin: Negative.     Allergic/Immunologic: Negative.   Neurological: Negative.   Hematological: Negative.   Psychiatric/Behavioral: Negative.        Objective:   Physical Exam  Constitutional: He is oriented to person, place, and time. He appears well-developed and well-nourished.  The patient is pleasant and alert  HENT:  Head: Normocephalic and atraumatic.  Right Ear: External ear normal.  Left Ear: External ear normal.  Nose: Nose normal.  Mouth/Throat: Oropharynx is clear and moist. No oropharyngeal exudate.  Eyes: Pupils are equal, round, and reactive to light. Conjunctivae and EOM are normal. Right eye exhibits no discharge. Left eye exhibits no discharge.  He is up-to-date on his eye exam and just saw Dr. Carolynn Sayers in April.  Neck: Normal range of motion. Neck supple. No thyromegaly present.  No bruits thyromegaly or anterior cervical adenopathy  Cardiovascular: Normal rate, regular rhythm, normal heart sounds and intact distal pulses.  No murmur heard. Heart has a regular rate and rhythm at 72/min  Pulmonary/Chest: Effort normal and breath sounds normal. He has no wheezes. He has no rales. He exhibits no tenderness.  Clear anteriorly and posteriorly and no axillary adenopathy  Abdominal: Soft. Bowel sounds are normal. He exhibits no mass. There is no tenderness. There is no guarding.  No abdominal tenderness masses or organ enlargement  Genitourinary: Rectum normal and penis normal.  Genitourinary Comments: Prostate remains slightly enlarged but smooth.  There were no lumps or masses.  There were no rectal masses.  The external genitalia were within normal limits and no inguinal hernias were palpated.  Musculoskeletal: Normal range of motion. He exhibits no edema.  Lymphadenopathy:    He has no cervical adenopathy.  Neurological: He is alert and oriented to person, place, and time. He has normal reflexes. No cranial nerve deficit.  Skin: Skin is warm and dry. No rash noted.  Multiple seborrheic  keratoses and actinic keratoses.  Patient has a history of melanoma and he sees his dermatologist regularly.  Psychiatric: He has a normal mood and affect. His behavior is normal. Judgment and thought content normal.  Patient is alert and in good spirits.  Nursing note and vitals reviewed.  BP 128/64 (BP Location: Left Arm)   Pulse 71   Temp (!) 97 F (36.1 C) (Oral)   Ht 5' 7.5" (1.715 m)   Wt 189 lb (85.7 kg)   BMI 29.16 kg/m   Chest x-ray with results pending===      Assessment & Plan:  1. Hyperlipidemia LDL goal <70 -Continue with aggressive therapeutic lifestyle changes watching diet closely and making all efforts to lose weight.  Continue with current treatment. - DG Chest 2 View; Future - CBC with Differential/Platelet - Lipid panel  2. Type 2 diabetes mellitus with stage 3 chronic kidney disease, without long-term current use of insulin ([place]) -Continue with aggressive therapeutic lifestyle changes and current treatment - BMP8+EGFR - CBC with Differential/Platelet - Bayer DCA Hb A1c Waived - Microalbumin / creatinine urine ratio  3. CKD (chronic kidney disease) stage 3, GFR 30-59 ml/min (HCC) -Continue to keep blood pressure under good control and avoid all NSAIDs like ibuprofen and Aleve - CBC with Differential/Platelet  4. ASCVD (arteriosclerotic cardiovascular disease) -Follow-up with cardiology as planned - DG Chest 2 View; Future - CBC with Differential/Platelet  5. Essential hypertension, benign -The blood pressure is good today and he will continue with current treatment - DG Chest 2 View; Future - BMP8+EGFR - CBC with Differential/Platelet - Hepatic function panel  6. Vitamin D deficiency -Continue with vitamin D replacement pending results of lab work - CBC with Differential/Platelet - VITAMIN D 25 Hydroxy (Vit-D Deficiency, Fractures)  7. Benign prostatic hyperplasia, unspecified whether lower urinary tract symptoms present -Continue regular  follow-up with urology - CBC with Differential/Platelet - PSA, total and free - Urinalysis, Complete  8. Health care maintenance - Thyroid Panel With TSH  9. Thrombocytopenia (HCC) -No increased bleeding or bruising noted  10. Melanoma of skin ([place]) -Continue follow-up with dermatology  11. Type 2 diabetes mellitus with diabetic nephropathy, without long-term current use of insulin ([place]) -Continue with aggressive management and current treatment pending results of lab work  Patient Instructions                       Medicare Annual Wellness Visit  [facility] and the medical providers at [facility] strive to bring you the best medical care.  In doing so we not only want to address your current medical conditions and concerns but also to detect new conditions early and prevent illness, disease and health-related problems.    Medicare offers a yearly Wellness Visit which allows our clinical staff to assess your need for preventative services including immunizations, lifestyle education, counseling to decrease risk of preventable diseases and screening for fall risk and other medical concerns.    This visit is provided free of charge (no copay) for all Medicare recipients. The clinical pharmacists at [facility] have begun to conduct these Wellness Visits which will also include a thorough review of all your medications.    As you primary medical provider recommend that you make an appointment for your Annual Wellness Visit if you have not done so already this year.  You may set up this appointment before you leave today or you may call back ([phone]) and schedule an appointment.  Please make sure when you call that you mention that you are scheduling your Annual Wellness Visit with the clinical pharmacist so that the appointment may be made for the proper length of time.     Continue current medications. Continue good therapeutic lifestyle  changes which include good diet and exercise. Fall precautions discussed with patient. If an FOBT was given today- please return it to our front desk. If you are over 65 years old - you may need Prevnar 1 or the adult Pneumonia vaccine.  **Flu shots are available--- please call and schedule a FLU-CLINIC appointment**  After your visit with [place] today you will receive a survey in the mail or online from [employer] regarding your care with [place]. Please take a moment to fill this out. Your feedback is very important to [place] as you can help [place] better understand your patient needs as well as improve your experience and satisfaction. WE CARE ABOUT YOU!!!  Stay active physically and continue to watch her diet and lose weight through exercise and dieting Drink plenty of fluids and stay well-hydrated  Continue to follow-up regularly with dermatology because of past history of melanoma.  Arrie Senate MD

## 2018-04-22 NOTE — Addendum Note (Signed)
Addended by: Zannie Cove on: 04/22/2018 09:25 AM   Modules accepted: Orders

## 2018-04-23 LAB — HEPATIC FUNCTION PANEL
ALT: 32 IU/L (ref 0–44)
AST: 31 IU/L (ref 0–40)
Albumin: 4.6 g/dL (ref 3.5–4.8)
Alkaline Phosphatase: 44 IU/L (ref 39–117)
Bilirubin Total: 0.5 mg/dL (ref 0.0–1.2)
Bilirubin, Direct: 0.17 mg/dL (ref 0.00–0.40)
Total Protein: 7.4 g/dL (ref 6.0–8.5)

## 2018-04-23 LAB — CBC WITH DIFFERENTIAL/PLATELET
Basophils Absolute: 0.1 10*3/uL (ref 0.0–0.2)
Basos: 1 %
EOS (ABSOLUTE): 0.1 10*3/uL (ref 0.0–0.4)
Eos: 2 %
Hematocrit: 41.1 % (ref 37.5–51.0)
Hemoglobin: 13.6 g/dL (ref 13.0–17.7)
Immature Grans (Abs): 0 10*3/uL (ref 0.0–0.1)
Immature Granulocytes: 1 %
Lymphocytes Absolute: 2.7 10*3/uL (ref 0.7–3.1)
Lymphs: 37 %
MCH: 29.7 pg (ref 26.6–33.0)
MCHC: 33.1 g/dL (ref 31.5–35.7)
MCV: 90 fL (ref 79–97)
Monocytes Absolute: 0.6 10*3/uL (ref 0.1–0.9)
Monocytes: 8 %
Neutrophils Absolute: 3.8 10*3/uL (ref 1.4–7.0)
Neutrophils: 51 %
Platelets: 171 10*3/uL (ref 150–379)
RBC: 4.58 x10E6/uL (ref 4.14–5.80)
RDW: 14.1 % (ref 12.3–15.4)
WBC: 7.4 10*3/uL (ref 3.4–10.8)

## 2018-04-23 LAB — LIPID PANEL
Chol/HDL Ratio: 8.2 ratio — ABNORMAL HIGH (ref 0.0–5.0)
Cholesterol, Total: 147 mg/dL (ref 100–199)
HDL: 18 mg/dL — ABNORMAL LOW (ref >39)
Triglycerides: 510 mg/dL — ABNORMAL HIGH (ref 0–149)

## 2018-04-23 LAB — BMP8+EGFR
BUN/Creatinine Ratio: 12 (ref 10–24)
BUN: 21 mg/dL (ref 8–27)
CO2: 20 mmol/L (ref 20–29)
Calcium: 9.5 mg/dL (ref 8.6–10.2)
Chloride: 100 mmol/L (ref 96–106)
Creatinine, Ser: 1.76 mg/dL — ABNORMAL HIGH (ref 0.76–1.27)
GFR calc Af Amer: 42 mL/min/{1.73_m2} — ABNORMAL LOW (ref >59)
GFR calc non Af Amer: 37 mL/min/{1.73_m2} — ABNORMAL LOW (ref >59)
Glucose: 177 mg/dL — ABNORMAL HIGH (ref 65–99)
Potassium: 4.3 mmol/L (ref 3.5–5.2)
Sodium: 137 mmol/L (ref 134–144)

## 2018-04-23 LAB — PSA, TOTAL AND FREE
PSA, Free Pct: 70 %
PSA, Free: 0.14 ng/mL
Prostate Specific Ag, Serum: 0.2 ng/mL (ref 0.0–4.0)

## 2018-04-23 LAB — THYROID PANEL WITH TSH
Free Thyroxine Index: 2 (ref 1.2–4.9)
T3 Uptake Ratio: 27 % (ref 24–39)
T4, Total: 7.5 ug/dL (ref 4.5–12.0)
TSH: 4.16 u[IU]/mL (ref 0.450–4.500)

## 2018-04-23 LAB — MICROALBUMIN / CREATININE URINE RATIO
Creatinine, Urine: 103.3 mg/dL
Microalb/Creat Ratio: 78 mg/g creat — ABNORMAL HIGH (ref 0.0–30.0)
Microalbumin, Urine: 80.6 ug/mL

## 2018-04-23 LAB — VITAMIN D 25 HYDROXY (VIT D DEFICIENCY, FRACTURES): Vit D, 25-Hydroxy: 15.2 ng/mL — ABNORMAL LOW (ref 30.0–100.0)

## 2018-05-02 ENCOUNTER — Telehealth: Payer: Self-pay

## 2018-05-02 NOTE — Telephone Encounter (Signed)
John, This pt is scheduled for a colon on 05/21/18 with Dr Carlean Purl. He has a history of CAD and stenosis. I did not see an echo but there is documentation on 09/04/17 regarding stenosis and having stents put in.  Can you please review his records and verify if pt is OK for his colon on [facility]. Thanks, Gwyndolyn Saxon in [facility]

## 2018-05-03 NOTE — Telephone Encounter (Signed)
Timothy [name],  He seems fine but I would like to see the cath report.  Not sure why I do not find it on Epic, could you obtain a copy?  Thanks,  [name]

## 2018-05-05 NOTE — Telephone Encounter (Signed)
John,  Please look in Epic under encounter tab, date 06/23/2010 "admission" Dr.Michael Burt Knack. That is the cardiac cath. Op. Note. Thank you! Robbin pv

## 2018-05-05 NOTE — Telephone Encounter (Signed)
Noted. Robbin pv

## 2018-05-05 NOTE — Telephone Encounter (Signed)
Robbin,  Thanks for the heads up.  This pt is cleared for anesthetic care at [facility].  Thanks,  [name]

## 2018-05-06 ENCOUNTER — Ambulatory Visit (AMBULATORY_SURGERY_CENTER): Payer: Self-pay

## 2018-05-06 VITALS — Ht 67.5 in | Wt 193.5 lb

## 2018-05-06 DIAGNOSIS — Z800 Family history of malignant neoplasm of digestive organs: Secondary | ICD-10-CM

## 2018-05-06 DIAGNOSIS — Z86010 Personal history of colonic polyps: Secondary | ICD-10-CM

## 2018-05-06 NOTE — Progress Notes (Signed)
Per pt, no allergies to soy or egg products.Pt not taking any weight loss meds or using  O2 at home.  Pt refused emmi video. 

## 2018-05-07 ENCOUNTER — Encounter: Payer: Self-pay | Admitting: Internal Medicine

## 2018-05-08 ENCOUNTER — Ambulatory Visit: Payer: Medicare HMO | Admitting: *Deleted

## 2018-05-21 ENCOUNTER — Encounter: Payer: Self-pay | Admitting: Internal Medicine

## 2018-05-21 ENCOUNTER — Ambulatory Visit (AMBULATORY_SURGERY_CENTER): Payer: Medicare HMO | Admitting: Internal Medicine

## 2018-05-21 ENCOUNTER — Other Ambulatory Visit: Payer: Self-pay

## 2018-05-21 VITALS — BP 109/52 | HR 67 | Temp 97.1°F | Resp 13 | Ht 69.0 in | Wt 189.0 lb

## 2018-05-21 DIAGNOSIS — I10 Essential (primary) hypertension: Secondary | ICD-10-CM | POA: Diagnosis not present

## 2018-05-21 DIAGNOSIS — I2510 Atherosclerotic heart disease of native coronary artery without angina pectoris: Secondary | ICD-10-CM | POA: Diagnosis not present

## 2018-05-21 DIAGNOSIS — I252 Old myocardial infarction: Secondary | ICD-10-CM | POA: Diagnosis not present

## 2018-05-21 DIAGNOSIS — D123 Benign neoplasm of transverse colon: Secondary | ICD-10-CM

## 2018-05-21 DIAGNOSIS — Z86010 Personal history of colonic polyps: Secondary | ICD-10-CM | POA: Diagnosis not present

## 2018-05-21 MED ORDER — SODIUM CHLORIDE 0.9 % IV SOLN
500.0000 mL | Freq: Once | INTRAVENOUS | Status: DC
Start: 2018-05-21 — End: 2018-05-21

## 2018-05-21 NOTE — Patient Instructions (Addendum)
   I found and removed one tiny polyp. It looks benign. I will let you know pathology results. I do not think I will recommend another colonoscopy on a routine basis.  I appreciate the opportunity to care for you. Gatha Mayer, MD, [facility]  *handouts given on polyps  YOU HAD AN ENDOSCOPIC PROCEDURE TODAY AT [facility]:   Refer to the procedure report that was given to you for any specific questions about what was found during the examination.  If the procedure report does not answer your questions, please call your gastroenterologist to clarify.  If you requested that your care partner not be given the details of your procedure findings, then the procedure report has been included in a sealed envelope for you to review at your convenience later.  YOU SHOULD EXPECT: Some feelings of bloating in the abdomen. Passage of more gas than usual.  Walking can help get rid of the air that was put into your GI tract during the procedure and reduce the bloating. If you had a lower endoscopy (such as a colonoscopy or flexible sigmoidoscopy) you may notice spotting of blood in your stool or on the toilet paper. If you underwent a bowel prep for your procedure, you may not have a normal bowel movement for a few days.  Please Note:  You might notice some irritation and congestion in your nose or some drainage.  This is from the oxygen used during your procedure.  There is no need for concern and it should clear up in a day or so.  SYMPTOMS TO REPORT IMMEDIATELY:   Following lower endoscopy (colonoscopy or flexible sigmoidoscopy):  Excessive amounts of blood in the stool  Significant tenderness or worsening of abdominal pains  Swelling of the abdomen that is new, acute  Fever of 100F or higher   For urgent or emergent issues, a gastroenterologist can be reached at any hour by calling [phone].   DIET:  We do recommend a small meal at first, but then you may proceed to your  regular diet.  Drink plenty of fluids but you should avoid alcoholic beverages for 24 hours.  ACTIVITY:  You should plan to take it easy for the rest of today and you should NOT DRIVE or use heavy machinery until tomorrow (because of the sedation medicines used during the test).    FOLLOW UP: Our staff will call the number listed on your records the next business day following your procedure to check on you and address any questions or concerns that you may have regarding the information given to you following your procedure. If we do not reach you, we will leave a message.  However, if you are feeling well and you are not experiencing any problems, there is no need to return our call.  We will assume that you have returned to your regular daily activities without incident.  If any biopsies were taken you will be contacted by phone or by letter within the next 1-3 weeks.  Please call us at [phone] if you have not heard about the biopsies in 3 weeks.    SIGNATURES/CONFIDENTIALITY: You and/or your care partner have signed paperwork which will be entered into your electronic medical record.  These signatures attest to the fact that that the information above on your After Visit Summary has been reviewed and is understood.  Full responsibility of the confidentiality of this discharge information lies with you and/or your care-partner.

## 2018-05-21 NOTE — Progress Notes (Signed)
Report given to PACU, vss 

## 2018-05-21 NOTE — Op Note (Signed)
[facility] Patient Name: Venus Ruhe Procedure Date: 05/21/2018 8:18 AM MRN: [id number] Endoscopist: Gatha Mayer , MD Age: 77 Referring MD:  Date of Birth: 07-Nov-1941 Gender: Male Account #: [id number] Procedure:                Colonoscopy Indications:              Surveillance: Personal history of adenomatous                            polyps on last colonoscopy 5 years ago Medicines:                Propofol per Anesthesia, Monitored Anesthesia Care Procedure:                Pre-Anesthesia Assessment:                           - Prior to the procedure, a History and Physical                            was performed, and patient medications and                            allergies were reviewed. The patient's tolerance of                            previous anesthesia was also reviewed. The risks                            and benefits of the procedure and the sedation                            options and risks were discussed with the patient.                            All questions were answered, and informed consent                            was obtained. Prior Anticoagulants: The patient has                            taken no previous anticoagulant or antiplatelet                            agents. ASA Grade Assessment: III - A patient with                            severe systemic disease. After reviewing the risks                            and benefits, the patient was deemed in                            satisfactory condition to undergo the procedure.  After obtaining informed consent, the colonoscope                            was passed under direct vision. Throughout the                            procedure, the patient's blood pressure, pulse, and                            oxygen saturations were monitored continuously. The                            Colonoscope was introduced through the anus and   advanced to the the cecum, identified by                            appendiceal orifice and ileocecal valve. The                            colonoscopy was somewhat difficult due to                            significant looping. Successful completion of the                            procedure was aided by applying abdominal pressure.                            The patient tolerated the procedure well. The                            quality of the bowel preparation was good. The                            ileocecal valve, appendiceal orifice, and rectum                            were photographed. The bowel preparation used was                            Miralax. Scope In: 8:30:14 AM Scope Out: 8:34:19 AM Scope Withdrawal Time: 0 hours 12 minutes 28 seconds  Total Procedure Duration: 0 hours 16 minutes 33 seconds  Findings:                 A diminutive polyp was found in the transverse                            colon. The polyp was sessile. The polyp was removed                            with a cold snare. Resection and retrieval were                            complete. Verification  of patient identification                            for the specimen was done. Estimated blood loss was                            minimal.                           The exam was otherwise without abnormality on                            direct and retroflexion views. cecal views limited                            by retained vegetable material. Complications:            No immediate complications. Estimated Blood Loss:     Estimated blood loss was minimal. Impression:               - One diminutive polyp in the transverse colon,                            removed with a cold snare. Resected and retrieved.                           - The examination was otherwise normal on direct                            and retroflexion views. Recommendation:           - Patient has a contact number available for                             emergencies. The signs and symptoms of potential                            delayed complications were discussed with the                            patient. Return to normal activities tomorrow.                            Written discharge instructions were provided to the                            patient.                           - Resume previous diet.                           - Continue present medications.                           - No repeat colonoscopy due to age. Has only had  one adenoma in past - brother had colon cancer but                            was elderly so do not think routine repeat merited. Gatha Mayer, MD 05/21/2018 8:54:36 AM This report has been signed electronically.

## 2018-05-21 NOTE — Progress Notes (Signed)
Called to room to assist during endoscopic procedure.  Patient ID and intended procedure confirmed with present staff. Received instructions for my participation in the procedure from the performing physician.  

## 2018-05-21 NOTE — Progress Notes (Signed)
Pt's states no medical or surgical changes since previsit or office visit. 

## 2018-05-22 ENCOUNTER — Telehealth: Payer: Self-pay

## 2018-05-22 NOTE — Telephone Encounter (Signed)
  Follow up Call-  Call back number 05/21/2018  Post procedure Call Back phone  # [phone]  Permission to leave phone message Yes  Some recent data might be hidden     Patient questions:  Do you have a fever, pain , or abdominal swelling? No. Pain Score  0 *  Have you tolerated food without any problems? Yes.    Have you been able to return to your normal activities? Yes.    Do you have any questions about your discharge instructions: Diet   No. Medications  No. Follow up visit  No.  Do you have questions or concerns about your Care? No.  Actions: * If pain score is 4 or above: No action needed, pain <4.

## 2018-05-23 ENCOUNTER — Other Ambulatory Visit: Payer: Self-pay | Admitting: Family Medicine

## 2018-05-27 ENCOUNTER — Other Ambulatory Visit: Payer: Self-pay | Admitting: Family Medicine

## 2018-05-28 ENCOUNTER — Encounter: Payer: Self-pay | Admitting: Internal Medicine

## 2018-05-28 NOTE — Progress Notes (Signed)
Diminutive adenoma No recall - age My Chart

## 2018-06-10 DIAGNOSIS — M5136 Other intervertebral disc degeneration, lumbar region: Secondary | ICD-10-CM | POA: Diagnosis not present

## 2018-08-15 ENCOUNTER — Emergency Department (HOSPITAL_COMMUNITY): Payer: Medicare HMO

## 2018-08-15 ENCOUNTER — Ambulatory Visit (INDEPENDENT_AMBULATORY_CARE_PROVIDER_SITE_OTHER): Payer: Medicare HMO | Admitting: Family Medicine

## 2018-08-15 ENCOUNTER — Inpatient Hospital Stay (HOSPITAL_COMMUNITY)
Admission: EM | Admit: 2018-08-15 | Discharge: 2018-08-18 | DRG: 372 | Disposition: A | Discharge status: Home / Self Care | Payer: Medicare HMO | Attending: Family Medicine | Admitting: Family Medicine

## 2018-08-15 ENCOUNTER — Encounter (HOSPITAL_COMMUNITY): Payer: Self-pay | Admitting: Emergency Medicine

## 2018-08-15 ENCOUNTER — Ambulatory Visit (INDEPENDENT_AMBULATORY_CARE_PROVIDER_SITE_OTHER): Payer: Medicare HMO

## 2018-08-15 ENCOUNTER — Other Ambulatory Visit: Payer: Self-pay

## 2018-08-15 ENCOUNTER — Encounter: Payer: Self-pay | Admitting: Family Medicine

## 2018-08-15 VITALS — BP 96/42 | HR 60 | Temp 96.8°F | Ht 69.0 in | Wt 185.0 lb

## 2018-08-15 DIAGNOSIS — G4733 Obstructive sleep apnea (adult) (pediatric): Secondary | ICD-10-CM | POA: Diagnosis present

## 2018-08-15 DIAGNOSIS — E872 Acidosis: Secondary | ICD-10-CM | POA: Diagnosis present

## 2018-08-15 DIAGNOSIS — I10 Essential (primary) hypertension: Secondary | ICD-10-CM

## 2018-08-15 DIAGNOSIS — E861 Hypovolemia: Secondary | ICD-10-CM | POA: Diagnosis present

## 2018-08-15 DIAGNOSIS — R1084 Generalized abdominal pain: Secondary | ICD-10-CM | POA: Diagnosis not present

## 2018-08-15 DIAGNOSIS — N183 Chronic kidney disease, stage 3 unspecified: Secondary | ICD-10-CM | POA: Diagnosis present

## 2018-08-15 DIAGNOSIS — Z9841 Cataract extraction status, right eye: Secondary | ICD-10-CM

## 2018-08-15 DIAGNOSIS — K56609 Unspecified intestinal obstruction, unspecified as to partial versus complete obstruction: Secondary | ICD-10-CM

## 2018-08-15 DIAGNOSIS — Z87891 Personal history of nicotine dependence: Secondary | ICD-10-CM

## 2018-08-15 DIAGNOSIS — K56600 Partial intestinal obstruction, unspecified as to cause: Secondary | ICD-10-CM | POA: Diagnosis not present

## 2018-08-15 DIAGNOSIS — A040 Enteropathogenic Escherichia coli infection: Principal | ICD-10-CM | POA: Diagnosis present

## 2018-08-15 DIAGNOSIS — Z9842 Cataract extraction status, left eye: Secondary | ICD-10-CM

## 2018-08-15 DIAGNOSIS — N189 Chronic kidney disease, unspecified: Secondary | ICD-10-CM

## 2018-08-15 DIAGNOSIS — E1121 Type 2 diabetes mellitus with diabetic nephropathy: Secondary | ICD-10-CM | POA: Diagnosis not present

## 2018-08-15 DIAGNOSIS — R197 Diarrhea, unspecified: Secondary | ICD-10-CM

## 2018-08-15 DIAGNOSIS — N179 Acute kidney failure, unspecified: Secondary | ICD-10-CM | POA: Diagnosis not present

## 2018-08-15 DIAGNOSIS — K529 Noninfective gastroenteritis and colitis, unspecified: Secondary | ICD-10-CM

## 2018-08-15 DIAGNOSIS — E860 Dehydration: Secondary | ICD-10-CM | POA: Diagnosis present

## 2018-08-15 DIAGNOSIS — E1122 Type 2 diabetes mellitus with diabetic chronic kidney disease: Secondary | ICD-10-CM | POA: Diagnosis present

## 2018-08-15 DIAGNOSIS — Z955 Presence of coronary angioplasty implant and graft: Secondary | ICD-10-CM | POA: Diagnosis not present

## 2018-08-15 DIAGNOSIS — I2510 Atherosclerotic heart disease of native coronary artery without angina pectoris: Secondary | ICD-10-CM | POA: Diagnosis present

## 2018-08-15 DIAGNOSIS — Z79899 Other long term (current) drug therapy: Secondary | ICD-10-CM

## 2018-08-15 DIAGNOSIS — Z7984 Long term (current) use of oral hypoglycemic drugs: Secondary | ICD-10-CM | POA: Diagnosis not present

## 2018-08-15 DIAGNOSIS — K760 Fatty (change of) liver, not elsewhere classified: Secondary | ICD-10-CM | POA: Diagnosis not present

## 2018-08-15 DIAGNOSIS — Z7982 Long term (current) use of aspirin: Secondary | ICD-10-CM | POA: Diagnosis not present

## 2018-08-15 DIAGNOSIS — E785 Hyperlipidemia, unspecified: Secondary | ICD-10-CM | POA: Diagnosis present

## 2018-08-15 DIAGNOSIS — Z885 Allergy status to narcotic agent status: Secondary | ICD-10-CM | POA: Diagnosis not present

## 2018-08-15 DIAGNOSIS — I252 Old myocardial infarction: Secondary | ICD-10-CM

## 2018-08-15 DIAGNOSIS — Z85820 Personal history of malignant melanoma of skin: Secondary | ICD-10-CM

## 2018-08-15 DIAGNOSIS — R109 Unspecified abdominal pain: Secondary | ICD-10-CM | POA: Diagnosis not present

## 2018-08-15 LAB — CBC
HCT: 42.6 % (ref 39.0–52.0)
HCT: 39.9 % (ref 39.0–52.0)
Hemoglobin: 13.7 g/dL (ref 13.0–17.0)
Hemoglobin: 13 g/dL (ref 13.0–17.0)
MCH: 30 pg (ref 26.0–34.0)
MCH: 30.7 pg (ref 26.0–34.0)
MCHC: 32.2 g/dL (ref 30.0–36.0)
MCHC: 32.6 g/dL (ref 30.0–36.0)
MCV: 93.4 fL (ref 78.0–100.0)
MCV: 94.3 fL (ref 78.0–100.0)
Platelets: 196 10*3/uL (ref 150–400)
Platelets: 151 10*3/uL (ref 150–400)
RBC: 4.56 MIL/uL (ref 4.22–5.81)
RBC: 4.23 MIL/uL (ref 4.22–5.81)
RDW: 14.3 % (ref 11.5–15.5)
RDW: 14.4 % (ref 11.5–15.5)
WBC: 10.4 10*3/uL (ref 4.0–10.5)
WBC: 8.3 10*3/uL (ref 4.0–10.5)

## 2018-08-15 LAB — BASIC METABOLIC PANEL
Anion gap: 15 (ref 5–15)
BUN: 109 mg/dL — ABNORMAL HIGH (ref 8–23)
CO2: 14 mmol/L — ABNORMAL LOW (ref 22–32)
Calcium: 8.1 mg/dL — ABNORMAL LOW (ref 8.9–10.3)
Chloride: 104 mmol/L (ref 98–111)
Creatinine, Ser: 7.26 mg/dL — ABNORMAL HIGH (ref 0.61–1.24)
GFR calc Af Amer: 7 mL/min — ABNORMAL LOW (ref >60)
GFR calc non Af Amer: 6 mL/min — ABNORMAL LOW (ref >60)
Glucose, Bld: 142 mg/dL — ABNORMAL HIGH (ref 70–99)
Potassium: 3.8 mmol/L (ref 3.5–5.1)
Sodium: 133 mmol/L — ABNORMAL LOW (ref 135–145)

## 2018-08-15 LAB — GLUCOSE, CAPILLARY: Glucose-Capillary: 172 mg/dL — ABNORMAL HIGH (ref 70–99)

## 2018-08-15 LAB — COMPREHENSIVE METABOLIC PANEL
ALT: 21 U/L (ref 0–44)
AST: 19 U/L (ref 15–41)
Albumin: 4 g/dL (ref 3.5–5.0)
Alkaline Phosphatase: 41 U/L (ref 38–126)
Anion gap: 15 (ref 5–15)
BUN: 106 mg/dL — ABNORMAL HIGH (ref 8–23)
CO2: 15 mmol/L — ABNORMAL LOW (ref 22–32)
Calcium: 8.7 mg/dL — ABNORMAL LOW (ref 8.9–10.3)
Chloride: 102 mmol/L (ref 98–111)
Creatinine, Ser: 8.07 mg/dL — ABNORMAL HIGH (ref 0.61–1.24)
GFR calc Af Amer: 7 mL/min — ABNORMAL LOW (ref >60)
GFR calc non Af Amer: 6 mL/min — ABNORMAL LOW (ref >60)
Glucose, Bld: 239 mg/dL — ABNORMAL HIGH (ref 70–99)
Potassium: 4.1 mmol/L (ref 3.5–5.1)
Sodium: 132 mmol/L — ABNORMAL LOW (ref 135–145)
Total Bilirubin: 0.8 mg/dL (ref 0.3–1.2)
Total Protein: 7.2 g/dL (ref 6.5–8.1)

## 2018-08-15 LAB — LIPASE, BLOOD: Lipase: 85 U/L — ABNORMAL HIGH (ref 11–51)

## 2018-08-15 MED ORDER — SODIUM CHLORIDE 0.9 % IV SOLN
INTRAVENOUS | Status: DC
  Administered 2018-08-15 – 2018-08-17 (×6): via INTRAVENOUS

## 2018-08-15 MED ORDER — AMLODIPINE-OLMESARTAN 5-40 MG PO TABS: 1.0000 | ORAL_TABLET | Freq: Every day | ORAL | Status: DC

## 2018-08-15 MED ORDER — MORPHINE SULFATE (PF) 2 MG/ML IV SOLN
2.0000 mg | Freq: Once | INTRAVENOUS | Status: DC
  Filled 2018-08-15: qty 1

## 2018-08-15 MED ORDER — IRBESARTAN 300 MG PO TABS: 300.0000 mg | ORAL_TABLET | Freq: Every day | ORAL | Status: DC

## 2018-08-15 MED ORDER — LATANOPROST 0.005 % OP SOLN
1.0000 [drp] | Freq: Every day | OPHTHALMIC | Status: DC
  Administered 2018-08-15 – 2018-08-17 (×3): 1 [drp] via OPHTHALMIC
  Filled 2018-08-15: qty 2.5

## 2018-08-15 MED ORDER — SODIUM CHLORIDE 0.9 % IV BOLUS
500.0000 mL | Freq: Once | INTRAVENOUS | Status: AC
  Administered 2018-08-15: 500 mL via INTRAVENOUS

## 2018-08-15 MED ORDER — OMEGA-3-ACID ETHYL ESTERS 1 G PO CAPS
2.0000 g | ORAL_CAPSULE | Freq: Two times a day (BID) | ORAL | Status: DC
  Administered 2018-08-16: 2 g via ORAL
  Filled 2018-08-15 (×5): qty 2

## 2018-08-15 MED ORDER — METOPROLOL SUCCINATE ER 50 MG PO TB24
50.0000 mg | ORAL_TABLET | Freq: Every day | ORAL | Status: DC
  Administered 2018-08-16 – 2018-08-18 (×3): 50 mg via ORAL
  Filled 2018-08-15 (×3): qty 1

## 2018-08-15 MED ORDER — GI COCKTAIL ~~LOC~~
30.0000 mL | Freq: Once | ORAL | Status: DC
  Filled 2018-08-15: qty 30

## 2018-08-15 MED ORDER — ASPIRIN EC 81 MG PO TBEC
81.0000 mg | DELAYED_RELEASE_TABLET | Freq: Every day | ORAL | Status: DC
  Administered 2018-08-16 – 2018-08-18 (×3): 81 mg via ORAL
  Filled 2018-08-15 (×3): qty 1

## 2018-08-15 MED ORDER — BOOST / RESOURCE BREEZE PO LIQD CUSTOM
1.0000 | Freq: Three times a day (TID) | ORAL | Status: DC
  Administered 2018-08-16 – 2018-08-17 (×6): 1 via ORAL

## 2018-08-15 MED ORDER — INSULIN ASPART 100 UNIT/ML ~~LOC~~ SOLN: 0.0000 [IU] | Freq: Every day | SUBCUTANEOUS | Status: DC

## 2018-08-15 MED ORDER — ONDANSETRON HCL 4 MG/2ML IJ SOLN: 4.0000 mg | Freq: Three times a day (TID) | INTRAMUSCULAR | Status: DC | PRN

## 2018-08-15 MED ORDER — ROSUVASTATIN CALCIUM 10 MG PO TABS
10.0000 mg | ORAL_TABLET | Freq: Every day | ORAL | Status: DC
  Administered 2018-08-15 – 2018-08-17 (×3): 10 mg via ORAL
  Filled 2018-08-15 (×3): qty 1

## 2018-08-15 MED ORDER — RISAQUAD PO CAPS
1.0000 | ORAL_CAPSULE | Freq: Every day | ORAL | Status: DC
  Administered 2018-08-16 – 2018-08-18 (×3): 1 via ORAL
  Filled 2018-08-15 (×3): qty 1

## 2018-08-15 MED ORDER — INSULIN ASPART 100 UNIT/ML ~~LOC~~ SOLN
0.0000 [IU] | Freq: Three times a day (TID) | SUBCUTANEOUS | Status: DC
  Administered 2018-08-16: 2 [IU] via SUBCUTANEOUS
  Administered 2018-08-17: 3 [IU] via SUBCUTANEOUS
  Administered 2018-08-17: 2 [IU] via SUBCUTANEOUS
  Administered 2018-08-18: 5 [IU] via SUBCUTANEOUS
  Administered 2018-08-18: 2 [IU] via SUBCUTANEOUS

## 2018-08-15 MED ORDER — PANTOPRAZOLE SODIUM 40 MG PO TBEC: 40.0000 mg | DELAYED_RELEASE_TABLET | Freq: Every day | ORAL | Status: DC

## 2018-08-15 MED ORDER — AMLODIPINE BESYLATE 5 MG PO TABS
5.0000 mg | ORAL_TABLET | Freq: Every day | ORAL | Status: DC
  Administered 2018-08-16 – 2018-08-18 (×3): 5 mg via ORAL
  Filled 2018-08-15 (×3): qty 1

## 2018-08-15 MED ORDER — ONDANSETRON HCL 4 MG/2ML IJ SOLN
4.0000 mg | Freq: Once | INTRAMUSCULAR | Status: AC
  Administered 2018-08-15: 4 mg via INTRAVENOUS
  Filled 2018-08-15: qty 2

## 2018-08-15 MED ORDER — HEPARIN SODIUM (PORCINE) 5000 UNIT/ML IJ SOLN
5000.0000 [IU] | Freq: Three times a day (TID) | INTRAMUSCULAR | Status: DC
  Administered 2018-08-15 – 2018-08-18 (×8): 5000 [IU] via SUBCUTANEOUS
  Filled 2018-08-15 (×8): qty 1

## 2018-08-15 MED ORDER — NITROGLYCERIN 0.4 MG SL SUBL: 0.4000 mg | SUBLINGUAL_TABLET | SUBLINGUAL | Status: DC | PRN

## 2018-08-15 MED ORDER — IOPAMIDOL (ISOVUE-300) INJECTION 61%
INTRAVENOUS | Status: AC
  Filled 2018-08-15: qty 30

## 2018-08-15 MED ORDER — GI COCKTAIL ~~LOC~~
30.0000 mL | Freq: Three times a day (TID) | ORAL | Status: DC | PRN
  Administered 2018-08-15: 30 mL via ORAL

## 2018-08-15 NOTE — ED Provider Notes (Signed)
[facility] EMERGENCY DEPARTMENT Provider Note   CSN: [id number] Arrival date & time: 08/15/18  1159     History   Chief Complaint Chief Complaint  Patient presents with  . Abdominal Pain  . Emesis  . Diarrhea    HPI Timothy [name] is a 77 y.o. male.  Patient with history of cholecystectomy, appendectomy, hernia repair --presents to the emergency department today with complaint of possible bowel obstruction.  Patient has had 4 days of abdominal pain, nausea, vomiting, and decreased bowel movements.  Patient denies passing gas.  He states that he has had a couple "spoonfuls" of diarrhea without blood.  No chest pain or shortness of breath.  He went to see his primary care doctor today who did an x-ray which suggested a partial small bowel obstruction and he was referred to the emergency department.  No urinary symptoms.  Patient reports that he has been able to keep down a little bit of Gatorade.  Cold water makes the pain a lot worse.  He states that he has been in bed for most of the past 4 days.  The onset of this condition was acute. The course is constant. Aggravating factors: oral intake. Alleviating factors: none.       Past Medical History:  Diagnosis Date  . Cataract   . Chronic kidney disease   . Colon polyp   . Coronary artery disease    (left main normal, LAD 25-30% stenosis, distal 30-40% stenosis, circumflex obtuse marginal 50% stenosis,  right coronary artery dominant with long 75% followed by mid 80%  stenosis followed by 9% stenosis at the ostium of the PDA.  He had  stenting of the PDA by Dr. [name].  This was a Cypher stent.   First angioplasty was 1994, Cath 2011 100% RCA ).      . Diabetes mellitus without complication ([place])   . Essential hypertension, benign   . Glaucoma   . Hyperlipidemia   . Hyperplasia of prostate   . 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 Melanoma ([place])    melanoma both sides face, right ear   . 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 OSA (obstructive sleep apnea) 08/17/2016  . Sleep apnea    no c-pap    Patient Active Problem List   Diagnosis Date Noted  . Thrombocytopenia ([place]) 09/23/2016  . OSA (obstructive sleep apnea) 08/17/2016  . Elevated triglycerides with high cholesterol 10/18/2015  . CKD (chronic kidney disease) stage 3, GFR 30-59 ml/min ([facility]) 05/13/2015  . Glaucoma 12/27/2014  . Overweight(278.02) 06/27/2011  . ASCVD (arteriosclerotic cardiovascular disease)   . Essential hypertension, benign   . Hyperplasia of prostate   . 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 Hyperlipidemia LDL goal <70 05/13/2009  . Coronary atherosclerosis 05/13/2009    Past Surgical History:  Procedure Laterality Date  . APPENDECTOMY    . CERVICAL SPINE SURGERY    . CHOLECYSTECTOMY    . COLONOSCOPY  2004   multiple since  . CORONARY ANGIOPLASTY WITH STENT PLACEMENT     3 sents  . EYE SURGERY Bilateral    cataracts  . HERNIA REPAIR Bilateral   . SKIN CANCER EXCISION  03/17/2017        Home Medications    Prior to Admission medications   Medication Sig  Start Date End Date Taking? Authorizing Provider  amLODipine-olmesartan (AZOR) 5-40 MG tablet TAKE 1 TABLET DAILY 05/23/18   Chipper Herb, MD  aspirin 81 MG EC tablet Take 81 mg by mouth daily.      [provider]  bisacodyl (DULCOLAX) 5 MG EC tablet Take 5 mg by mouth. Dulcolax 5 mg bowel prep #4-Take as directed    [provider]  Choline Fenofibrate (FENOFIBRIC ACID) 135 MG CPDR TAKE (1) CAPSULE DAILY 05/23/18   Chipper Herb, MD  esomeprazole (NEXIUM) 40 MG capsule TAKE (1) CAPSULE DAILY 05/23/18   Chipper Herb, MD  glimepiride (AMARYL) 4 MG tablet TAKE (1) TABLET TWICE A DAY. 05/23/18   Chipper Herb, MD  hydrochlorothiazide (HYDRODIURIL) 25 MG tablet TAKE 1 TABLET  DAILY 05/23/18   Chipper Herb, MD  JANUMET XR 50-500 MG TB24 TAKE 1 TABLET DAILY 03/20/17   Chipper Herb, MD  latanoprost (XALATAN) 0.005 % ophthalmic solution Place 1 drop into both eyes at bedtime.    [provider]  metoprolol succinate (TOPROL-XL) 50 MG 24 hr tablet TAKE 1 TABLET ONCE A DAY WITH A MEAL 05/23/18   Chipper Herb, MD  MYRBETRIQ 50 MG TB24 tablet Take 50 mg by mouth daily. 08/28/17   [provider]  nitroGLYCERIN (NITROSTAT) 0.4 MG SL tablet Place 1 tablet (0.4 mg total) under the tongue every 5 (five) minutes as needed. 07/26/14   Chipper Herb, MD  rosuvastatin (CRESTOR) 10 MG tablet TAKE 1 TABLET DAILY 05/28/18   Chipper Herb, MD  VASCEPA 1 g CAPS TAKE (2) CAPSULES TWICE DAILY. 12/23/15   Chipper Herb, MD    Family History Family History  Problem Relation Age of Onset  . Cancer Mother        unsure of type  . Glaucoma Mother   . Heart disease Father   . Coronary artery disease Father   . Cancer Sister 18       granlocytosis (Wergerner's)  . Colon cancer Brother 32  . Nephritis Sister        20's  . Cancer Brother        lung     Social History Social History   Tobacco Use  . Smoking status: Former Smoker    Packs/day: 2.00    Years: 25.00    Pack years: 50.00    Types: Cigarettes    Last attempt to quit: 08/14/1991    Years since quitting: 27.0  . Smokeless tobacco: Never Used  Substance Use Topics  . Alcohol use: Yes    Comment: rare  . Drug use: No     Allergies   Hydrocodone-acetaminophen   Review of Systems Review of Systems  Constitutional: Negative for fever.  HENT: Negative for rhinorrhea and sore throat.   Eyes: Negative for redness.  Respiratory: Negative for cough and shortness of breath.   Cardiovascular: Negative for chest pain.  Gastrointestinal: Positive for abdominal pain, diarrhea, nausea and vomiting.  Genitourinary: Positive for decreased urine volume. Negative for dysuria.  Musculoskeletal:  Negative for myalgias.  Skin: Negative for rash.  Neurological: Negative for headaches.     Physical Exam Updated Vital Signs BP (!) 108/49 (BP Location: Left Arm)   Pulse (!) 101   Temp 97.6 F (36.4 C) (Oral)   Resp 18   Ht 5' 7.5" (1.715 m)   Wt 82.6 kg   SpO2 98%   BMI 28.08 kg/m   Physical Exam  Constitutional: He appears well-developed and well-nourished.  HENT:  Head: Normocephalic and atraumatic.  Eyes: Conjunctivae are normal. Right eye exhibits no discharge. Left eye exhibits no discharge.  Neck: Normal range of motion. Neck supple.  Cardiovascular: Regular rhythm and normal heart sounds. Tachycardia present.  Pulmonary/Chest: Effort normal and breath sounds normal.  Abdominal: Soft. Bowel sounds are decreased. There is tenderness (mild to moderate, worse epigastric and periumbilical) in the epigastric area. There is no rigidity, no rebound and no guarding.  Neurological: He is alert.  Skin: Skin is warm and dry.  Psychiatric: He has a normal mood and affect.  Nursing note and vitals reviewed.    ED Treatments / Results  Labs (all labs ordered are listed, but only abnormal results are displayed) Labs Reviewed  LIPASE, BLOOD - Abnormal; Notable for the following components:      Result Value   Lipase 85 (*)    All other components within normal limits  COMPREHENSIVE METABOLIC PANEL - Abnormal; Notable for the following components:   Sodium 132 (*)    CO2 15 (*)    Glucose, Bld 239 (*)    BUN 106 (*)    Creatinine, Ser 8.07 (*)    Calcium 8.7 (*)    GFR calc non Af Amer 6 (*)    GFR calc Af Amer 7 (*)    All other components within normal limits  CBC  URINALYSIS, ROUTINE W REFLEX MICROSCOPIC    EKG None  Radiology Ct Abdomen Pelvis Wo Contrast  Result Date: 08/15/2018 CLINICAL DATA:  Low-grade bowel obstruction. Nausea, diarrhea, and cramping for 4 days EXAM: CT ABDOMEN AND PELVIS WITHOUT CONTRAST TECHNIQUE: Multidetector CT imaging of the  abdomen and pelvis was performed following the standard protocol without IV contrast. COMPARISON:  None. FINDINGS: Lower chest:  Extensive coronary atherosclerosis. Hepatobiliary: Hepatic steatosis without focal abnormality.The gallbladder is absent. No ductal dilatation. Pancreas: Unremarkable. Spleen: Unremarkable. Adrenals/Urinary Tract: Negative adrenals. No hydronephrosis or stone. Unremarkable bladder. Stomach/Bowel: Contrast has only reached the proximal small bowel. There are proximal small bowel fluid levels and ileal loops are decompressed, but no discrete transition point is seen and there is no fecalization. Fluid is also seen throughout the colon, reaching the sigmoid, and correlating with history of diarrhea. No detected bowel wall thickening. Appendectomy. Vascular/Lymphatic: Atherosclerotic calcification. No mass or adenopathy. Reproductive:Negative. Other: No ascites or pneumoperitoneum. Calcified nodule in the left lower quadrant attributed to old inflammation. Remote bilateral inguinal hernia repair. Musculoskeletal: Advanced spondylosis and degenerative disc narrowing with posterior element hypertrophy. No acute osseous finding. IMPRESSION: 1. There is proximal small bowel fluid levels and distal small bowel collapse but no transition point is seen and there is fluid throughout the colon. Enteritis and diarrheal illness is favored over partial small bowel obstruction. Radiographs could be used to follow the oral contrast through the bowel if continued clinical concern for obstruction. 2. Prominent hepatic steatosis. 3. Atherosclerosis that is extensive in the coronaries. Electronically Signed   By: Monte Fantasia M.D.   On: 08/15/2018 16:37   Dg Abd 2 Views  Result Date: 08/15/2018 CLINICAL DATA:  Abdominal pain for several days EXAM: ABDOMEN - 2 VIEW COMPARISON:  None. FINDINGS: Scattered large and small bowel gas is noted. A few mildly prominent loops of small bowel are noted with  air-fluid levels. This may be related to the patient's given clinical history of diarrhea although the possibility of a partial small bowel obstruction could not be totally excluded. The need for further evaluation  can be determined on a clinical basis. IMPRESSION: Mildly dilated small bowel with air-fluid levels. Correlation with the clinical exam is recommended. This may represent a partial small bowel obstruction. Electronically Signed   By: Inez Catalina M.D.   On: 08/15/2018 11:00    Procedures Procedures (including critical care time)  Medications Ordered in ED Medications  iopamidol (ISOVUE-300) 61 % injection (has no administration in time range)  sodium chloride 0.9 % bolus 500 mL (has no administration in time range)  0.9 %  sodium chloride infusion (has no administration in time range)  sodium chloride 0.9 % bolus 500 mL (0 mLs Intravenous Stopped 08/15/18 1415)  ondansetron (ZOFRAN) injection 4 mg (4 mg Intravenous Given 08/15/18 1316)     Initial Impression / Assessment and Plan / ED Course  I have reviewed the triage vital signs and the nursing notes.  Pertinent labs & imaging results that were available during my care of the patient were reviewed by me and considered in my medical decision making (see chart for details).     Patient seen and examined. Work-up initiated. Medications ordered.   Vital signs reviewed and are as follows: BP 114/76   Pulse 64   Temp 97.6 F (36.4 C) (Oral)   Resp (!) 27   Ht 5' 7.5" (1.715 m)   Wt 82.6 kg   SpO2 97%   BMI 28.08 kg/m   AKI noted. Hyperglycemia.   4:49 PM Patient back from CT. Appears more like enteritis than partial SBO. Pt and family updated. Hold NG. Continue hydration. Patient without vomiting. Given ice chips. Will admit. PCP [place].  5:02 PM Spoke with Dr. Steffanie Dunn of [facility] who will see.    Final Clinical Impressions(s) / ED Diagnoses   Final diagnoses:  Acute kidney injury ([place])  Enteritis    Admit.    ED Discharge Orders    None       Carlisle Cater, Hershal Coria 08/15/18 1703    Maudie Flakes, MD 08/15/18 2013

## 2018-08-15 NOTE — ED Notes (Signed)
Patient aware of need for urine sample - states he is unable to go at this time, but will try soon.

## 2018-08-15 NOTE — H&P (Signed)
History and Physical    Timothy [name] WUJ:[id number] DOB: 06-22-1941 DOA: 08/15/2018  PCP: Chipper Herb, MD Consultants:  none Patient coming from: home- lives with wife  Chief Complaint: Abdominal pain, N/V  HPI: Timothy [name] is a 77 y.o. male with medical history significant for T2DM, HLD, CAD, HTN, B12 deficiency, CKD III, who presented to the ED today with c/o  4 days abd pain, N/V and diarrhea.  He was in his usual state of health until late Monday night when he went to bed with a vague stomachache.  Around 2 AM he was woken up by sudden nausea and had an episode of nonbilious nonbloody emesis. He has continued to have recurrent nausea and vomiting since then.  He has been able to maintain some fluids and kept down egg sandwich this morning.  He is also had episodic diarrhea since Wednesday.  He states he had one episode of melena on Wednesday which is now resolved.  He says he has about a spoonful of yellowish-brown liquidy stool 4-5 times a day.  He is only urinating about 2 times a day and his urine is dark.  He went to his primary care doctor today who did plain films which showed possible partial small bowel obstruction.  He was therefore sent to the ED. He cannot recall any suspect foods he might have eaten that may be the culprit. He has had no sick contacts.  He states he has had no fevers but has been having more chills at night.  ED Course: Given 2 500 cc boluses, zofran 4 mg CT of the abdomen and pelvis showed proximal small bowel fluid levels and a distal small bowel collapse but no transition point and there was fluid seen throughout the colon.  Favor enteritis and diarrheal illness over partial small bowel obstruction.  Review of Systems: As per HPI; otherwise review of systems reviewed and negative.   Ambulatory Status:  Ambulates without assistance  Past Medical History:  Diagnosis Date  . Cataract   . Chronic kidney disease   . Colon polyp   . Coronary artery  disease    (left main normal, LAD 25-30% stenosis, distal 30-40% stenosis, circumflex obtuse marginal 50% stenosis,  right coronary artery dominant with long 75% followed by mid 80%  stenosis followed by 9% stenosis at the ostium of the PDA.  He had  stenting of the PDA by Dr. [name].  This was a Cypher stent.   First angioplasty was 1994, Cath 2011 100% RCA ).      . Diabetes mellitus without complication ([place])   . Essential hypertension, benign   . Glaucoma   . Hyperlipidemia   . Hyperplasia of prostate   . 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 Melanoma ([place])    melanoma both sides face, right ear  . Myocardial infarction ([place])    per pt/ had mild heart attacks/has 3 stents  . OSA (obstructive sleep apnea) 08/17/2016  . Sleep apnea    no c-pap    Past Surgical History:  Procedure Laterality Date  . APPENDECTOMY    . CERVICAL SPINE SURGERY    . CHOLECYSTECTOMY    . COLONOSCOPY  2004   multiple since  . CORONARY ANGIOPLASTY WITH STENT PLACEMENT     3 sents  . EYE SURGERY Bilateral    cataracts  . HERNIA REPAIR Bilateral   . SKIN CANCER EXCISION  03/17/2017    Social History   Socioeconomic  History  . Marital status: Married    Spouse name: Not on file  . Number of children: 2  . Years of education: Not on file  . Highest education level: Not on file  Occupational History  . Occupation: retired  Scientific laboratory technician  . Financial resource strain: Not on file  . Food insecurity:    Worry: Not on file    Inability: Not on file  . Transportation needs:    Medical: Not on file    Non-medical: Not on file  Tobacco Use  . Smoking status: Former Smoker    Packs/day: 2.00    Years: 25.00    Pack years: 50.00    Types: Cigarettes    Last attempt to quit: 08/14/1991    Years since quitting: 27.0  . Smokeless tobacco: Never Used  Substance and Sexual Activity  . Alcohol use: Yes    Comment: rare  . Drug use: No  . Sexual activity: Yes  Lifestyle  .  Physical activity:    Days per week: Not on file    Minutes per session: Not on file  . Stress: Not on file  Relationships  . Social connections:    Talks on phone: Not on file    Gets together: Not on file    Attends religious service: Not on file    Active member of club or organization: Not on file    Attends meetings of clubs or organizations: Not on file    Relationship status: Not on file  . Intimate partner violence:    Fear of current or ex partner: Not on file    Emotionally abused: Not on file    Physically abused: Not on file    Forced sexual activity: Not on file  Other Topics Concern  . Not on file  Social History Narrative  . Not on file    Allergies  Allergen Reactions  . Hydrocodone-Acetaminophen Other (See Comments)    Makes pt. Feel loopy    Family History  Problem Relation Age of Onset  . Cancer Mother        unsure of type  . Glaucoma Mother   . Heart disease Father   . Coronary artery disease Father   . Cancer Sister 38       granlocytosis (Wergerner's)  . Colon cancer Brother 47  . Nephritis Sister        20's  . Cancer Brother        lung     Prior to Admission medications   Medication Sig Start Date End Date Taking? Authorizing Provider  amLODipine-olmesartan (AZOR) 5-40 MG tablet TAKE 1 TABLET DAILY Patient taking differently: Take 1 tablet by mouth daily.  05/23/18  Yes Chipper Herb, MD  aspirin 81 MG EC tablet Take 81 mg by mouth daily.     Yes [provider]  Choline Fenofibrate (FENOFIBRIC ACID) 135 MG CPDR TAKE (1) CAPSULE DAILY Patient taking differently: Take 135 mg by mouth at bedtime. TAKE (1) CAPSULE DAILY 05/23/18  Yes Chipper Herb, MD  esomeprazole (NEXIUM) 40 MG capsule TAKE (1) CAPSULE DAILY Patient taking differently: Take 40 mg by mouth daily at 12 noon. TAKE (1) CAPSULE DAILY 05/23/18  Yes Chipper Herb, MD  glimepiride (AMARYL) 4 MG tablet TAKE (1) TABLET TWICE A DAY. Patient taking differently: Take 4 mg by  mouth 2 (two) times daily.  05/23/18  Yes Chipper Herb, MD  hydrochlorothiazide (HYDRODIURIL) 25 MG tablet TAKE 1  TABLET DAILY Patient taking differently: Take 25 mg by mouth daily.  05/23/18  Yes Chipper Herb, MD  JANUMET XR 50-500 MG TB24 TAKE 1 TABLET DAILY Patient taking differently: Take 1 tablet by mouth at bedtime.  03/20/17  Yes Chipper Herb, MD  latanoprost (XALATAN) 0.005 % ophthalmic solution Place 1 drop into both eyes at bedtime.   Yes [provider]  metoprolol succinate (TOPROL-XL) 50 MG 24 hr tablet TAKE 1 TABLET ONCE A DAY WITH A MEAL Patient taking differently: Take 50 mg by mouth daily. TAKE 1 TABLET ONCE A DAY WITH A MEAL 05/23/18  Yes Chipper Herb, MD  nitroGLYCERIN (NITROSTAT) 0.4 MG SL tablet Place 1 tablet (0.4 mg total) under the tongue every 5 (five) minutes as needed. 07/26/14  Yes Chipper Herb, MD  Probiotic Product (PROBIOTIC DAILY PO) Take 1 application by mouth daily at 12 noon.   Yes [provider]  rosuvastatin (CRESTOR) 10 MG tablet TAKE 1 TABLET DAILY Patient taking differently: Take 10 mg by mouth at bedtime.  05/28/18  Yes Chipper Herb, MD  VASCEPA 1 g CAPS TAKE (2) CAPSULES TWICE DAILY. Patient taking differently: Take 2 g by mouth as needed.  12/23/15  Yes Chipper Herb, MD  MYRBETRIQ 50 MG TB24 tablet Take 50 mg by mouth daily. 08/28/17   [provider]    Physical Exam: Vitals:   08/15/18 1345 08/15/18 1400 08/15/18 1415 08/15/18 1430  BP: (!) 116/54 (!) 130/49 (!) 122/57 (!) 137/40  Pulse: 92 90 94 (!) 57  Resp: 19 20 (!) 23 17  Temp:      TempSrc:      SpO2: 97% 95% 96% 96%  Weight:      Height:         . General:  Appears calm, moderately uncomfortable, and is in NAD . Eyes:  PERRL, EOMI, normal lids, iris . ENT:  grossly normal hearing, lips & tongue, mmm; appropriate dentition . Neck:  no LAD, masses or thyromegaly; no carotid bruits . Cardiovascular:  tachy rate, reg rhythm, no  murmur. Marland Kitchen Respiratory:   CTA bilaterally with no wheezes/rales/rhonchi.  Normal respiratory effort. . Abdomen: soft, mildly protuberant, mildly TTP diffusely on R side; hypoactive BS, no R/G . Back:   grossly normal alignment, no CVAT . Skin:  no rash or induration seen on limited exam . Musculoskeletal:  grossly normal tone BUE/BLE, good ROM, no bony abnormality or obvious joint deformity . Lower extremity:  No LE edema.  Limited foot exam with no ulcerations.  2+ distal pulses. Marland Kitchen Psychiatric:  grossly normal mood and affect, speech fluent and appropriate, AOx3 . Neurologic:  CN 2-12 grossly intact, moves all extremities in coordinated fashion, sensation intact    Radiological Exams on Admission: Ct Abdomen Pelvis Wo Contrast  Result Date: 08/15/2018 CLINICAL DATA:  Low-grade bowel obstruction. Nausea, diarrhea, and cramping for 4 days EXAM: CT ABDOMEN AND PELVIS WITHOUT CONTRAST TECHNIQUE: Multidetector CT imaging of the abdomen and pelvis was performed following the standard protocol without IV contrast. COMPARISON:  None. FINDINGS: Lower chest:  Extensive coronary atherosclerosis. Hepatobiliary: Hepatic steatosis without focal abnormality.The gallbladder is absent. No ductal dilatation. Pancreas: Unremarkable. Spleen: Unremarkable. Adrenals/Urinary Tract: Negative adrenals. No hydronephrosis or stone. Unremarkable bladder. Stomach/Bowel: Contrast has only reached the proximal small bowel. There are proximal small bowel fluid levels and ileal loops are decompressed, but no discrete transition point is seen and there is no fecalization. Fluid is also seen throughout the  colon, reaching the sigmoid, and correlating with history of diarrhea. No detected bowel wall thickening. Appendectomy. Vascular/Lymphatic: Atherosclerotic calcification. No mass or adenopathy. Reproductive:Negative. Other: No ascites or pneumoperitoneum. Calcified nodule in the left lower quadrant attributed to old inflammation.  Remote bilateral inguinal hernia repair. Musculoskeletal: Advanced spondylosis and degenerative disc narrowing with posterior element hypertrophy. No acute osseous finding. IMPRESSION: 1. There is proximal small bowel fluid levels and distal small bowel collapse but no transition point is seen and there is fluid throughout the colon. Enteritis and diarrheal illness is favored over partial small bowel obstruction. Radiographs could be used to follow the oral contrast through the bowel if continued clinical concern for obstruction. 2. Prominent hepatic steatosis. 3. Atherosclerosis that is extensive in the coronaries. Electronically Signed   By: Monte Fantasia M.D.   On: 08/15/2018 16:37   Dg Abd 2 Views  Result Date: 08/15/2018 CLINICAL DATA:  Abdominal pain for several days EXAM: ABDOMEN - 2 VIEW COMPARISON:  None. FINDINGS: Scattered large and small bowel gas is noted. A few mildly prominent loops of small bowel are noted with air-fluid levels. This may be related to the patient's given clinical history of diarrhea although the possibility of a partial small bowel obstruction could not be totally excluded. The need for further evaluation can be determined on a clinical basis. IMPRESSION: Mildly dilated small bowel with air-fluid levels. Correlation with the clinical exam is recommended. This may represent a partial small bowel obstruction. Electronically Signed   By: Inez Catalina M.D.   On: 08/15/2018 11:00    EKG: N/A  Labs on Admission: I have personally reviewed the available labs and imaging studies at the time of the admission.  Pertinent labs:  Na 132 K 4.1 Cl 102 CO2 15 Gluc 239 BUN 106 Creat 8.07 (baseline 1.8) WBC 10.4 Hgb 13.7 Plt 196  Assessment/Plan Principal Problem:   Enteritis Active Problems:   Type 2 diabetes mellitus with diabetic nephropathy (HCC)   Hyperlipidemia LDL goal <70   Coronary atherosclerosis   Essential hypertension, benign   CKD (chronic kidney  disease) stage 3, GFR 30-59 ml/min (HCC)   OSA (obstructive sleep apnea)   AKI (acute kidney injury) ([place])   Enteritis, likely infectious, ie viral vs bacterial -with hypovolemia due to fluid losses and inability to keep down po -send GIP, C diff -s/p 500 cc bolus x 2. Will give mIVF at 150 cc/hour -zofran prn -avoid morphine given AKI -GI cocktail prn -pt reports pain now at a 4; he does not feel that he needs opiate pain meds at this time; if this changes, would give dilaudid or other short-acting, non-renally metabolized opiate. He is allergic to hydrocodone. -serial abdominal exams  AKI on CKD with metabolic acidosis, uremia; most likely prerenal from diarrheal illness in combination with taking HCTZ, ARB -creatinine up to 8.07 from a baseline of ~1.8 -CO2 is 15 -BUN 106 -NS at 150 cc/hour -BMP twice daily until establish trend -avoid all nephrotoxic meds -dose all meds for CrCl <10 -hold PPI -strict I/O -if renal function worsens or fails to improve, would check urine lytes, urine protein, renal ultrasound  Essential HTN -cont home amlodipine -hold home HCTZ 2/2 renal failure -hold home ARB 2/2 AKI   Dyslipidemia -hold fenofibrate 2/2 AKI -cont crestor  T2DM -hold oral hypoglycemics -SSI  CAD, stable -cont statin -hold ARB 2/2 AKI -cont ASA 81 mg   DVT prophylaxis: heparin [place] Code Status:  Full - confirmed with patient/family Family Communication: wife, etc  at bedside  Disposition Plan:  Home once clinically improved Consults called: none  Admission status: Admit - It is my clinical opinion that admission to INPATIENT is reasonable and necessary because of the expectation that this patient will require hospital care that crosses at least 2 midnights to treat this condition based on the medical complexity of the problems presented.  Given the aforementioned information, the predictability of an adverse outcome is felt to be significant.    Janora Norlander  MD [facility]  If note is complete, please contact covering daytime or nighttime physician. [email or web address] Password [facility]  08/15/2018, 4:56 PM

## 2018-08-15 NOTE — ED Notes (Signed)
Family at bedside. 

## 2018-08-15 NOTE — Progress Notes (Signed)
Subjective:  Patient ID: Timothy [name], male    DOB: 1941-07-15  Age: 77 y.o. MRN: [id number]  CC: Gi upset (diarrhea, nausea and vomiting - that started MON 2 am)   HPI [name] presents for 4 days of increasing abdominal discomfort with nausea and vomiting of most p.o. Intake .Also having diarrhea. No improvement using OTC immodium and pepto bismal. Abd feels swollen and crampy.   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   He has a past surgical history that includes Appendectomy; Cervical spine surgery; Eye surgery (Bilateral); Cholecystectomy; Skin cancer excision (03/17/2017); Colonoscopy (2004); Hernia repair (Bilateral); and Coronary angioplasty with stent.   His family history includes Cancer in his brother and mother; Cancer (age of onset: 83) in his sister; Colon cancer (age of onset: 42) in his brother; Coronary artery disease in his father; Glaucoma in his mother; Heart disease in his father; Nephritis in his sister.He reports that he quit smoking about 27 years ago. His smoking use included cigarettes. He has a 50.00 pack-year smoking history. He has never used smokeless tobacco. He reports that he drinks alcohol. He reports that he does not use drugs.    ROS Review of Systems  Constitutional: Negative.   HENT: Negative.   Eyes: Negative for visual disturbance.  Respiratory: Negative for cough and shortness of breath.   Cardiovascular: Negative for chest pain and  leg swelling.  Gastrointestinal: Positive for abdominal distention, abdominal pain, diarrhea, nausea and vomiting.  Genitourinary: Negative for difficulty urinating.  Musculoskeletal: Negative for arthralgias and myalgias.  Skin: Negative for rash.  Neurological: Negative for headaches.  Psychiatric/Behavioral: Negative for sleep disturbance.  Abd XR- differential air fluid levels suggestive of SBO  Objective:  BP (!) 96/42 (BP Location: Right Arm)   Pulse 60   Temp (!) 96.8 F (36 C) (Oral)   Ht _0  (1.753 m)   Wt 185 lb (83.9 kg)   BMI 27.32 kg/m   BP Readings from Last 3 Encounters:  08/18/18 (!) 142/64  08/15/18 (!) 96/42  05/21/18 (!) 109/52    Wt Readings from Last 3 Encounters:  08/18/18 194 lb 0.1 oz (88 kg)  08/15/18 185 lb (83.9 kg)  05/21/18 189 lb (85.7 kg)     Physical Exam    Assessment & Plan:   Ilia was seen today for gi upset.  Diagnoses and all orders for this visit:  Small bowel obstruction ([place])  Diarrhea, unspecified type -     DG Abd 2 Views; Future -     CBC with Differential/Platelet -     CMP14+EGFR -     Cancel: Urine Culture -     Cancel: Urinalysis, Complete -     Cancel: Urinalysis  Generalized abdominal pain -     DG Abd 2 Views; Future -     CBC with Differential/Platelet -     CMP14+EGFR -     Cancel: Urine Culture -     Cancel: Urinalysis, Complete -     Cancel: Urinalysis  I have discontinued Jotham Kisiel's JANUMET XR. I am also having him maintain his aspirin, nitroGLYCERIN, latanoprost, VASCEPA, MYRBETRIQ, Fenofibric Acid, esomeprazole, and rosuvastatin.  Allergies as of 08/15/2018      Reactions   Hydrocodone-acetaminophen Other (See Comments)   Makes pt. Feel loopy      Medication List        Accurate as of 08/15/18 12:02 PM. Always use your most recent med list.          amLODipine 10 MG tablet Commonly known as:  NORVASC Take 1 tablet (10 mg total) by mouth daily.     amLODipine-olmesartan 5-40 MG tablet Commonly known as:  AZOR TAKE 1 TABLET DAILY   aspirin 81 MG EC tablet Take 81 mg by mouth daily.   azithromycin 500 MG tablet Commonly known as:  ZITHROMAX Take 1 tablet (500 mg total) by mouth daily for 2 days.   bisacodyl 5 MG EC tablet Commonly known as:  DULCOLAX Take 5 mg by mouth. Dulcolax 5 mg bowel prep #4-Take as directed   esomeprazole 40 MG capsule Commonly known as:  NEXIUM TAKE (1) CAPSULE DAILY   Fenofibric Acid 135 MG Cpdr TAKE (1) CAPSULE DAILY   glimepiride 4 MG tablet Commonly known as:  AMARYL TAKE (1) TABLET TWICE A DAY.   hydrochlorothiazide 25 MG tablet Commonly known as:  HYDRODIURIL TAKE 1 TABLET DAILY   JANUMET XR 50-500 MG Tb24 Generic drug:  SitaGLIPtin-MetFORMIN HCl TAKE 1 TABLET DAILY   lactobacillus acidophilus & bulgar chewable tablet Chew 1 tablet by mouth 3 (three) times daily with meals for 10 days.   latanoprost 0.005 % ophthalmic solution Commonly known as:  XALATAN Place 1 drop into both eyes at bedtime.   metoprolol succinate 50 MG 24 hr tablet Commonly known as:  TOPROL-XL TAKE 1 TABLET ONCE A DAY WITH A MEAL   metoprolol succinate 50 MG 24 hr tablet Commonly known as:  TOPROL-XL TAKE 1 TABLET ONCE A DAY WITH A MEAL   MYRBETRIQ 50 MG Tb24 tablet Generic drug:  mirabegron ER Take 50 mg by mouth daily.   nitroGLYCERIN 0.4 MG SL tablet Commonly known as:  NITROSTAT Place 1 tablet (0.4 mg total) under the tongue every 5 (five) minutes as needed.   rosuvastatin 10 MG tablet Commonly known as:  CRESTOR TAKE 1 TABLET DAILY   sitaGLIPtin 50 MG tablet Commonly known as:  JANUVIA Take 1 tablet (50 mg total) by mouth daily.   sitaGLIPtin 50 MG tablet Commonly known as:  JANUVIA Take 0.5 tablets (25 mg total) by mouth daily. For diabetes   sodium bicarbonate 650 MG tablet Take 1 tablet (650 mg total) by mouth 3 (three) times daily for 5 days.   tamsulosin 0.4 MG Caps  capsule Commonly known as:  FLOMAX Take 1 capsule (0.4 mg total) by mouth daily after supper. For urine   VASCEPA 1 g Caps Generic drug:  Icosapent Ethyl TAKE (2) CAPSULES TWICE DAILY.      Pt. Sent to hospital for evaluation for admission.  Follow-up: Return in about 3 days (around 08/18/2018), or after hospital DC.  Claretta Fraise, M.D.

## 2018-08-15 NOTE — ED Notes (Signed)
Patient transported to CT 

## 2018-08-15 NOTE — ED Triage Notes (Signed)
Onset 4 days ago developed abdominal pain, nausea, emesis, and diarrhea. Seen at [facility] office today xray taken told patient there is a blockage in his intestines. Pain currently 3/10 achy sore.

## 2018-08-16 DIAGNOSIS — K529 Noninfective gastroenteritis and colitis, unspecified: Secondary | ICD-10-CM

## 2018-08-16 LAB — CBC WITH DIFFERENTIAL/PLATELET
Basophils Absolute: 0 10*3/uL (ref 0.0–0.2)
Basos: 0 %
EOS (ABSOLUTE): 0 10*3/uL (ref 0.0–0.4)
Eos: 0 %
Hematocrit: 41 % (ref 37.5–51.0)
Hemoglobin: 14.1 g/dL (ref 13.0–17.7)
Immature Grans (Abs): 0.1 10*3/uL (ref 0.0–0.1)
Immature Granulocytes: 1 %
Lymphocytes Absolute: 1.5 10*3/uL (ref 0.7–3.1)
Lymphs: 14 %
MCH: 31 pg (ref 26.6–33.0)
MCHC: 34.4 g/dL (ref 31.5–35.7)
MCV: 90 fL (ref 79–97)
Monocytes Absolute: 0.9 10*3/uL (ref 0.1–0.9)
Monocytes: 8 %
Neutrophils Absolute: 8 10*3/uL — ABNORMAL HIGH (ref 1.4–7.0)
Neutrophils: 77 %
Platelets: 199 10*3/uL (ref 150–450)
RBC: 4.55 x10E6/uL (ref 4.14–5.80)
RDW: 13.5 % (ref 12.3–15.4)
WBC: 10.5 10*3/uL (ref 3.4–10.8)

## 2018-08-16 LAB — CMP14+EGFR
ALT: 18 IU/L (ref 0–44)
AST: 12 IU/L (ref 0–40)
Albumin/Globulin Ratio: 1.5 (ref 1.2–2.2)
Albumin: 4.4 g/dL (ref 3.5–4.8)
Alkaline Phosphatase: 47 IU/L (ref 39–117)
BUN/Creatinine Ratio: 13 (ref 10–24)
BUN: 105 mg/dL (ref 8–27)
Bilirubin Total: 0.5 mg/dL (ref 0.0–1.2)
CO2: 12 mmol/L — ABNORMAL LOW (ref 20–29)
Calcium: 8.9 mg/dL (ref 8.6–10.2)
Chloride: 95 mmol/L — ABNORMAL LOW (ref 96–106)
Creatinine, Ser: 7.78 mg/dL (ref 0.76–1.27)
GFR calc Af Amer: 7 mL/min/{1.73_m2} — ABNORMAL LOW (ref >59)
GFR calc non Af Amer: 6 mL/min/{1.73_m2} — ABNORMAL LOW (ref >59)
Globulin, Total: 3 g/dL (ref 1.5–4.5)
Glucose: 267 mg/dL — ABNORMAL HIGH (ref 65–99)
Potassium: 4.7 mmol/L (ref 3.5–5.2)
Sodium: 133 mmol/L — ABNORMAL LOW (ref 134–144)
Total Protein: 7.4 g/dL (ref 6.0–8.5)

## 2018-08-16 LAB — URINALYSIS, ROUTINE W REFLEX MICROSCOPIC
Bacteria, UA: NONE SEEN
Bilirubin Urine: NEGATIVE
Glucose, UA: 50 mg/dL — AB
Ketones, ur: NEGATIVE mg/dL
Leukocytes, UA: NEGATIVE
Nitrite: NEGATIVE
Protein, ur: NEGATIVE mg/dL
Specific Gravity, Urine: 1.012 (ref 1.005–1.030)
pH: 5 (ref 5.0–8.0)

## 2018-08-16 LAB — GLUCOSE, CAPILLARY
Glucose-Capillary: 92 mg/dL (ref 70–99)
Glucose-Capillary: 117 mg/dL — ABNORMAL HIGH (ref 70–99)
Glucose-Capillary: 123 mg/dL — ABNORMAL HIGH (ref 70–99)
Glucose-Capillary: 142 mg/dL — ABNORMAL HIGH (ref 70–99)

## 2018-08-16 LAB — GASTROINTESTINAL PANEL BY PCR, STOOL (REPLACES STOOL CULTURE)

## 2018-08-16 LAB — BASIC METABOLIC PANEL
Anion gap: 16 — ABNORMAL HIGH (ref 5–15)
BUN: 108 mg/dL — ABNORMAL HIGH (ref 8–23)
CO2: 15 mmol/L — ABNORMAL LOW (ref 22–32)
Calcium: 7.9 mg/dL — ABNORMAL LOW (ref 8.9–10.3)
Chloride: 107 mmol/L (ref 98–111)
Creatinine, Ser: 6.58 mg/dL — ABNORMAL HIGH (ref 0.61–1.24)
GFR calc Af Amer: 8 mL/min — ABNORMAL LOW (ref >60)
GFR calc non Af Amer: 7 mL/min — ABNORMAL LOW (ref >60)
Glucose, Bld: 67 mg/dL — ABNORMAL LOW (ref 70–99)
Potassium: 3.5 mmol/L (ref 3.5–5.1)
Sodium: 138 mmol/L (ref 135–145)

## 2018-08-16 LAB — C DIFFICILE QUICK SCREEN W PCR REFLEX
C Diff antigen: NEGATIVE
C Diff interpretation: NOT DETECTED
C Diff toxin: NEGATIVE

## 2018-08-16 MED ORDER — SODIUM BICARBONATE 650 MG PO TABS
650.0000 mg | ORAL_TABLET | Freq: Three times a day (TID) | ORAL | Status: DC
  Administered 2018-08-16 – 2018-08-18 (×7): 650 mg via ORAL
  Filled 2018-08-16 (×7): qty 1

## 2018-08-16 MED ORDER — TAMSULOSIN HCL 0.4 MG PO CAPS
0.4000 mg | ORAL_CAPSULE | Freq: Every day | ORAL | Status: DC
  Administered 2018-08-16 – 2018-08-17 (×2): 0.4 mg via ORAL
  Filled 2018-08-16 (×2): qty 1

## 2018-08-16 MED ORDER — HYDRALAZINE HCL 20 MG/ML IJ SOLN: 10.0000 mg | Freq: Four times a day (QID) | INTRAMUSCULAR | Status: DC | PRN

## 2018-08-16 MED ORDER — AZITHROMYCIN 500 MG PO TABS
500.0000 mg | ORAL_TABLET | Freq: Every day | ORAL | Status: DC
  Administered 2018-08-16 – 2018-08-18 (×3): 500 mg via ORAL
  Filled 2018-08-16 (×3): qty 1

## 2018-08-16 NOTE — Progress Notes (Signed)
Initial Nutrition Assessment  DOCUMENTATION CODES:   Not applicable  INTERVENTION:  Continue Boost Breeze po TID, each supplement provides 250 kcal and 9 grams of protein.  Encourage adequate PO intake.   NUTRITION DIAGNOSIS:   Inadequate oral intake related to altered GI function(enteritis) as evidenced by per patient/family report.  GOAL:   Patient will meet greater than or equal to 90% of their needs  MONITOR:   PO intake, Supplement acceptance, Diet advancement, Labs, I & O's, Skin  REASON FOR ASSESSMENT:   Malnutrition Screening Tool    ASSESSMENT:   77 y.o. male with medical history significant for T2DM, HLD, CAD, HTN, B12 deficiency, CKD III presents with 4 days abd pain, N/V and diarrhea. Pt with enteritis and diarrheal illness.   Diet has been advanced to a full liquid diet. Meal completion 100%. Pt able to tolerate breakfast tray this morning. Pt does report slight abdominal discomfort during time of visit. Pt reports poor po intake since Monday, 8/26. He reports unable to keep most to any food down. Prior to acute illness, pt with eating well with no other difficulties. Pt with no weight loss. Pt currently has Boost Breeze ordered. As diet has been advanced, RD offered to order Ensure shake instead of Boost Breeze, however pt refused reporting the "milky" supplement will possibly exacerbate abdominal pains/discomfort. RD to continue with Boost Breeze orders. Family at bedside has been encouraging pt po intake. RD additionally educated on a low sodium diet regarding pt history of hypertension (per MD request) to follow post discharge after resolution of acute illness. Family and pt report understanding of information discussed.  Labs and medications reviewed.   NUTRITION - FOCUSED PHYSICAL EXAM:    Most Recent Value  Orbital Region  No depletion  Upper Arm Region  No depletion  Thoracic and Lumbar Region  No depletion  Buccal Region  No depletion  Temple Region   No depletion  Clavicle Bone Region  No depletion  Clavicle and Acromion Bone Region  No depletion  Scapular Bone Region  No depletion  Dorsal Hand  No depletion  Patellar Region  No depletion  Anterior Thigh Region  No depletion  Posterior Calf Region  No depletion  Edema (RD Assessment)  None  Hair  Reviewed  Eyes  Reviewed  Mouth  Reviewed  Skin  Reviewed  Nails  Reviewed       Diet Order:   Diet Order            Diet full liquid Room service appropriate? Yes; Fluid consistency: Thin  Diet effective now              EDUCATION NEEDS:   Education needs have been addressed  Skin:  Skin Assessment: Reviewed RN Assessment  Last BM:  8/31  Height:   Ht Readings from Last 1 Encounters:  08/15/18 5' 7.5" (1.715 m)    Weight:   Wt Readings from Last 1 Encounters:  08/15/18 82.6 kg    Ideal Body Weight:  68.6 kg  BMI:  Body mass index is 28.1 kg/m.  Estimated Nutritional Needs:   Kcal:  1610-9604  Protein:  85-100 grams  Fluid:  >/= 1.9 L/day    Corrin Parker, MS, RD, LDN Pager # [phone] After hours/ weekend pager # [phone]

## 2018-08-16 NOTE — Progress Notes (Addendum)
Patient Demographics:    Timothy [name], is a 77 y.o. male, DOB - 07/04/1941, TML:[id number]  Admit date - 08/15/2018   Admitting Physician Janora Norlander, MD  Outpatient Primary MD for the patient is Chipper Herb, MD  LOS - 1   Chief Complaint  Patient presents with  . Abdominal Pain  . Emesis  . Diarrhea        Subjective:    Timothy [name] today has no fevers,   No chest pain,  abd pain and nausea persist , patient's son and daughter at bedside  Assessment  & Plan :    Principal Problem:   Enteritis Active Problems:   Type 2 diabetes mellitus with diabetic nephropathy (HCC)   Hyperlipidemia LDL goal <70   Coronary atherosclerosis   Essential hypertension, benign   CKD (chronic kidney disease) stage 3, GFR 30-59 ml/min (HCC)   AKI (acute kidney injury) [facility])  Brief Summary 77 year old male with  pmhx  T2DM, HLD, CAD, HTN, B12 deficiency, CKD III admitted on 08/15/2018 with worsening renal function in the setting of nausea vomiting abdominal pain and diarrhea for 5 days, CT abdomen and pelvis with enteritis type findings, no obstructive findings.   Plan:- 1)Enteropathogenic E. coli Enteritis----patient and his son had similar symptoms within 24 hours after eating breakfast that included sausage,  biscuit bacon at [employer], discussed with on-call infectious disease specialist Dr. Megan Salon, okay to treat empirically with azithromycin 500 mg daily for 3 days, hiccups as resolved, nausea persist, no fevers no vomiting  2)AKI----acute kidney injury on CKD stage - III, AKI secondary to dehydration in the setting of significant GI losses with vomiting and diarrhea,     creatinine on admission= 7.78 ,   baseline creatinine = 1.8   , creatinine is now= 6.58 (peak 8.0)     , renally adjust medications, avoid nephrotoxic agents/dehydration/hypotension, hold on Olmesartan also hold HCTZ and Janumet,  continue IV fluids, add sodium bicarb tablets, monitor fluid output and input closely   3)DM2-A1c apparently has been under 7, Use Novolog/Humalog Sliding scale insulin with Accu-Cheks/Fingersticks as ordered   4)HTN--- olmesartan and HCTz on hold due to kidney concerns, continue Toprol-XL 50 mg daily, amlodipine 5 mg daily,  may use IV Hydralazine 10 mg  Every 4 hours Prn for systolic blood pressure over 160 mmhg   Disposition/Need for in-Hospital Stay- patient unable to be discharged at this time due to significant worsening of renal function requiring IV hydration and possibly bicarb infusions,   Code Status : full  Disposition Plan  : home   Consults  : Phone consult with Dr. Megan Salon  from infectious disease/consider nephrology consult if renal function fails to improve as anticipated with iv hydration   DVT Prophylaxis  :   Heparin   Lab Results  Component Value Date   PLT 151 08/15/2018    Inpatient Medications  Scheduled Meds: . acidophilus  1 capsule Oral Q1200  . amLODipine  5 mg Oral Daily  . aspirin EC  81 mg Oral Daily  . azithromycin  500 mg Oral Daily  . feeding supplement  1 Container Oral TID BM  . gi cocktail  30 mL Oral Once  . heparin  5,000 Units Subcutaneous Q8H  .  insulin aspart  0-15 Units Subcutaneous TID WC  . insulin aspart  0-5 Units Subcutaneous QHS  . latanoprost  1 drop Both Eyes QHS  . metoprolol succinate  50 mg Oral Daily  . omega-3 acid ethyl esters  2 g Oral BID  . rosuvastatin  10 mg Oral QHS  . sodium bicarbonate  650 mg Oral TID  . tamsulosin  0.4 mg Oral QPC supper   Continuous Infusions: . sodium chloride 150 mL/hr at 08/16/18 1341   PRN Meds:.gi cocktail, nitroGLYCERIN, ondansetron (ZOFRAN) IV    Anti-infectives (From admission, onward)   Start     Dose/Rate Route Frequency Ordered Stop   08/16/18 1800  azithromycin (ZITHROMAX) tablet 500 mg     500 mg Oral Daily 08/16/18 1614          Objective:   Vitals:    08/15/18 1829 08/15/18 2241 08/16/18 0611 08/16/18 1439  BP: (!) 124/57 (!) 98/47 (!) 109/95 104/65  Pulse: 96 76  78  Resp: (!) 22 20 19 18   Temp: 97.9 F (36.6 C) 98.6 F (37 C) 97.6 F (36.4 C) 97.8 F (36.6 C)  TempSrc: Oral Oral Oral Oral  SpO2: 96% 93% 97% 98%  Weight: 82.6 kg     Height: 5' 7.5" (1.715 m)       Wt Readings from Last 3 Encounters:  08/15/18 82.6 kg  08/15/18 83.9 kg  05/21/18 85.7 kg     Intake/Output Summary (Last 24 hours) at 08/16/2018 1617 Last data filed at 08/16/2018 1600 Gross per 24 hour  Intake 3650 ml  Output 550 ml  Net 3100 ml     Physical Exam Patient is examined daily including today on 08/16/18  , exams remain the same as of yesterday except that has changed   Gen:- Awake Alert,  In no apparent distress  HEENT:- [place].AT, No sclera icterus Neck-Supple Neck,No JVD,.  Lungs-  CTAB , good air movement CV- S1, S2 normal Abd-  +ve B.Sounds, Abd Soft, generalized abdominal discomfort without rebound or guarding,   no CVA tenderness Extremity/Skin:- No  edema,   good pulses Psych-affect is appropriate, oriented x3 Neuro-no new focal deficits, no tremors   Data Review:   Micro Results Recent Results (from the past 240 hour(s))  Gastrointestinal Panel by PCR , Stool     Status: Abnormal   Collection Time: 08/16/18  1:49 AM  Result Value Ref Range Status   Campylobacter species NOT DETECTED NOT DETECTED Final   Plesimonas shigelloides NOT DETECTED NOT DETECTED Final   Salmonella species NOT DETECTED NOT DETECTED Final   Yersinia enterocolitica NOT DETECTED NOT DETECTED Final   Vibrio species NOT DETECTED NOT DETECTED Final   Vibrio cholerae NOT DETECTED NOT DETECTED Final   Enteroaggregative E coli (EAEC) NOT DETECTED NOT DETECTED Final   Enteropathogenic E coli (EPEC) DETECTED (A) NOT DETECTED Final    Comment: RESULT CALLED TO, READ BACK BY AND VERIFIED WITH: CASSIDY GANOE 08/16/18 @ 1522  MLK    Enterotoxigenic E coli (ETEC) NOT  DETECTED NOT DETECTED Final   Shiga like toxin producing E coli (STEC) NOT DETECTED NOT DETECTED Final   Shigella/Enteroinvasive E coli (EIEC) NOT DETECTED NOT DETECTED Final   Cryptosporidium NOT DETECTED NOT DETECTED Final   Cyclospora cayetanensis NOT DETECTED NOT DETECTED Final   Entamoeba histolytica NOT DETECTED NOT DETECTED Final   Giardia lamblia NOT DETECTED NOT DETECTED Final   Adenovirus F40/41 NOT DETECTED NOT DETECTED Final   Astrovirus NOT DETECTED NOT  DETECTED Final   Norovirus GI/GII NOT DETECTED NOT DETECTED Final   Rotavirus A NOT DETECTED NOT DETECTED Final   Sapovirus (I, II, IV, and V) NOT DETECTED NOT DETECTED Final    Comment: Performed at [facility], [address] [place], [place] [address]  C difficile quick scan w PCR reflex     Status: None   Collection Time: 08/16/18  1:49 AM  Result Value Ref Range Status   C Diff antigen NEGATIVE NEGATIVE Final   C Diff toxin NEGATIVE NEGATIVE Final   C Diff interpretation No C. difficile detected.  Final    Comment: Performed at [facility], [place] [address] [place], [place] [address]    Radiology Reports Ct Abdomen Pelvis Wo Contrast  Result Date: 08/15/2018 CLINICAL DATA:  Low-grade bowel obstruction. Nausea, diarrhea, and cramping for 4 days EXAM: CT ABDOMEN AND PELVIS WITHOUT CONTRAST TECHNIQUE: Multidetector CT imaging of the abdomen and pelvis was performed following the standard protocol without IV contrast. COMPARISON:  None. FINDINGS: Lower chest:  Extensive coronary atherosclerosis. Hepatobiliary: Hepatic steatosis without focal abnormality.The gallbladder is absent. No ductal dilatation. Pancreas: Unremarkable. Spleen: Unremarkable. Adrenals/Urinary Tract: Negative adrenals. No hydronephrosis or stone. Unremarkable bladder. Stomach/Bowel: Contrast has only reached the proximal small bowel. There are proximal small bowel fluid levels and ileal loops are decompressed, but no discrete transition  point is seen and there is no fecalization. Fluid is also seen throughout the colon, reaching the sigmoid, and correlating with history of diarrhea. No detected bowel wall thickening. Appendectomy. Vascular/Lymphatic: Atherosclerotic calcification. No mass or adenopathy. Reproductive:Negative. Other: No ascites or pneumoperitoneum. Calcified nodule in the left lower quadrant attributed to old inflammation. Remote bilateral inguinal hernia repair. Musculoskeletal: Advanced spondylosis and degenerative disc narrowing with posterior element hypertrophy. No acute osseous finding. IMPRESSION: 1. There is proximal small bowel fluid levels and distal small bowel collapse but no transition point is seen and there is fluid throughout the colon. Enteritis and diarrheal illness is favored over partial small bowel obstruction. Radiographs could be used to follow the oral contrast through the bowel if continued clinical concern for obstruction. 2. Prominent hepatic steatosis. 3. Atherosclerosis that is extensive in the coronaries. Electronically Signed   By: Monte Fantasia M.D.   On: 08/15/2018 16:37   Dg Abd 2 Views  Result Date: 08/15/2018 CLINICAL DATA:  Abdominal pain for several days EXAM: ABDOMEN - 2 VIEW COMPARISON:  None. FINDINGS: Scattered large and small bowel gas is noted. A few mildly prominent loops of small bowel are noted with air-fluid levels. This may be related to the patient's given clinical history of diarrhea although the possibility of a partial small bowel obstruction could not be totally excluded. The need for further evaluation can be determined on a clinical basis. IMPRESSION: Mildly dilated small bowel with air-fluid levels. Correlation with the clinical exam is recommended. This may represent a partial small bowel obstruction. Electronically Signed   By: Inez Catalina M.D.   On: 08/15/2018 11:00     CBC Recent Labs  Lab 08/15/18 1023 08/15/18 1213 08/15/18 1836  WBC 10.5 10.4 8.3  HGB  14.1 13.7 13.0  HCT 41.0 42.6 39.9  PLT 199 196 151  MCV 90 93.4 94.3  MCH 31.0 30.0 30.7  MCHC 34.4 32.2 32.6  RDW 13.5 14.3 14.4  LYMPHSABS 1.5  --   --   EOSABS 0.0  --   --   BASOSABS 0.0  --   --  Chemistries  Recent Labs  Lab 08/15/18 1023 08/15/18 1213 08/15/18 1846 08/16/18 0451  NA 133* 132* 133* 138  K 4.7 4.1 3.8 3.5  CL 95* 102 104 107  CO2 12* 15* 14* 15*  GLUCOSE 267* 239* 142* 67*  BUN 105* 106* 109* 108*  CREATININE 7.78* 8.07* 7.26* 6.58*  CALCIUM 8.9 8.7* 8.1* 7.9*  AST 12 19  --   --   ALT 18 21  --   --   ALKPHOS 47 41  --   --   BILITOT 0.5 0.8  --   --    ------------------------------------------------------------------------------------------------------------------ No results for input(s): CHOL, HDL, LDLCALC, TRIG, CHOLHDL, LDLDIRECT in the last 72 hours.  Lab Results  Component Value Date   HGBA1C 6.9 12/28/2015   ------------------------------------------------------------------------------------------------------------------ No results for input(s): TSH, T4TOTAL, T3FREE, THYROIDAB in the last 72 hours.  Invalid input(s): FREET3 ------------------------------------------------------------------------------------------------------------------ No results for input(s): VITAMINB12, FOLATE, FERRITIN, TIBC, IRON, RETICCTPCT in the last 72 hours.  Coagulation profile No results for input(s): INR, PROTIME in the last 168 hours.  No results for input(s): DDIMER in the last 72 hours.  Cardiac Enzymes No results for input(s): CKMB, TROPONINI, MYOGLOBIN in the last 168 hours.  Invalid input(s): CK ------------------------------------------------------------------------------------------------------------------ No results found for: BNP   Roxan Hockey M.D on 08/16/2018 at 4:17 PM  Pager---[phone] Go to [email or web address] - password [email or web address] for contact info  [facility] - Office  [phone]

## 2018-08-16 NOTE — Progress Notes (Signed)
Received call from lab stating that patient is positive for enteropathogenic ecoli. Dr Denton Brick notified. Will continue enteric precautions.

## 2018-08-17 LAB — BASIC METABOLIC PANEL
Anion gap: 8 (ref 5–15)
BUN: 84 mg/dL — ABNORMAL HIGH (ref 8–23)
CO2: 16 mmol/L — ABNORMAL LOW (ref 22–32)
Calcium: 7.5 mg/dL — ABNORMAL LOW (ref 8.9–10.3)
Chloride: 113 mmol/L — ABNORMAL HIGH (ref 98–111)
Creatinine, Ser: 3.81 mg/dL — ABNORMAL HIGH (ref 0.61–1.24)
GFR calc Af Amer: 16 mL/min — ABNORMAL LOW (ref >60)
GFR calc non Af Amer: 14 mL/min — ABNORMAL LOW (ref >60)
Glucose, Bld: 87 mg/dL (ref 70–99)
Potassium: 3.5 mmol/L (ref 3.5–5.1)
Sodium: 137 mmol/L (ref 135–145)

## 2018-08-17 LAB — GLUCOSE, CAPILLARY
Glucose-Capillary: 109 mg/dL — ABNORMAL HIGH (ref 70–99)
Glucose-Capillary: 169 mg/dL — ABNORMAL HIGH (ref 70–99)
Glucose-Capillary: 146 mg/dL — ABNORMAL HIGH (ref 70–99)
Glucose-Capillary: 156 mg/dL — ABNORMAL HIGH (ref 70–99)

## 2018-08-17 NOTE — Progress Notes (Signed)
Patient Demographics:    Timothy [name], is a 77 y.o. male, DOB - January 21, 1941, EVO:[id number]  Admit date - 08/15/2018   Admitting Physician Timothy Norlander, MD  Outpatient Primary MD for the patient is Timothy Herb, MD  LOS - 2   Chief Complaint  Patient presents with  . Abdominal Pain  . Emesis  . Diarrhea        Subjective:    Timothy [name] today has no fevers,   No chest pain, one loose stool in the last 12 hours, nausea is better, requesting that diet be advanced  Assessment  & Plan :    Principal Problem:   Enteritis Active Problems:   Type 2 diabetes mellitus with diabetic nephropathy (HCC)   Hyperlipidemia LDL goal <70   Coronary atherosclerosis   Essential hypertension, benign   CKD (chronic kidney disease) stage 3, GFR 30-59 ml/min (HCC)   AKI (acute kidney injury) [name])  Brief Summary 77 year old male with  pmhx  T2DM, HLD, CAD, HTN, B12 deficiency, CKD III admitted on 08/15/2018 with worsening renal function in the setting of nausea vomiting abdominal pain and diarrhea for 5 days, CT abdomen and pelvis with enteritis type findings, no obstructive findings.   Plan:- 1)Enteropathogenic E. coli Enteritis----patient and his son had similar symptoms within 24 hours after eating breakfast that included sausage,  biscuit bacon at [employer], discussed with on-call infectious disease specialist Timothy [name], okay to treat empirically with azithromycin 500 mg daily for 3 days, improving slowly, one loose stool in the last 12 hours, nausea is better, requesting that diet be advanced   2)AKI----acute kidney injury on CKD stage - III, AKI secondary to dehydration in the setting of significant GI losses with vomiting and diarrhea,     creatinine on admission= 7.78 ,   baseline creatinine = 1.8   , creatinine is now=  3.81 (peak 8.0) , renally adjust medications, avoid nephrotoxic  agents/dehydration/hypotension, hold on Olmesartan also hold HCTZ and Janumet, continue IV fluids, add sodium bicarb tablets, monitor fluid output and input closely  3)DM2-A1c apparently has been under 7, Use Novolog/Humalog Sliding scale insulin with Accu-Cheks/Fingersticks as ordered   4)HTN--- olmesartan and HCTz on hold due to kidney concerns, continue Toprol-XL 50 mg daily, amlodipine 5 mg daily,  may use IV Hydralazine 10 mg  Every 4 hours Prn for systolic blood pressure over 160 mmhg   Disposition/Need for in-Hospital Stay- patient unable to be discharged at this time due to significant worsening of renal function requiring IV hydration and possibly bicarb infusions, continue IV hydration, possible discharge in 1 to 2 days if renal function continues to improve with above measures  Code Status : full  Disposition Plan  : home   Consults  : Phone consult with Timothy [name]  from infectious disease/consider nephrology consult if renal function fails to improve as anticipated with iv hydration   DVT Prophylaxis  :   Heparin   Lab Results  Component Value Date   PLT 151 08/15/2018    Inpatient Medications  Scheduled Meds: . acidophilus  1 capsule Oral Q1200  . amLODipine  5 mg Oral Daily  . aspirin EC  81 mg Oral Daily  . azithromycin  500 mg Oral Daily  .  feeding supplement  1 Container Oral TID BM  . gi cocktail  30 mL Oral Once  . heparin  5,000 Units Subcutaneous Q8H  . insulin aspart  0-15 Units Subcutaneous TID WC  . insulin aspart  0-5 Units Subcutaneous QHS  . latanoprost  1 drop Both Eyes QHS  . metoprolol succinate  50 mg Oral Daily  . omega-3 acid ethyl esters  2 g Oral BID  . rosuvastatin  10 mg Oral QHS  . sodium bicarbonate  650 mg Oral TID  . tamsulosin  0.4 mg Oral QPC supper   Continuous Infusions: . sodium chloride 50 mL/hr at 08/17/18 1249   PRN Meds:.gi cocktail, hydrALAZINE, nitroGLYCERIN, ondansetron (ZOFRAN) IV    Anti-infectives (From  admission, onward)   Start     Dose/Rate Route Frequency Ordered Stop   08/16/18 1800  azithromycin (ZITHROMAX) tablet 500 mg     500 mg Oral Daily 08/16/18 1614          Objective:   Vitals:   08/16/18 2048 08/17/18 0552 08/17/18 1012 08/17/18 1322  BP: (!) 121/54 (!) 116/102 133/65 119/64  Pulse: 60 71 72 69  Resp:  18  20  Temp: 97.8 F (36.6 C) 98.1 F (36.7 C) 98 F (36.7 C) 97.6 F (36.4 C)  TempSrc:   Oral Oral  SpO2: 99% 99% 97% 97%  Weight:  88.1 kg    Height:        Wt Readings from Last 3 Encounters:  08/17/18 88.1 kg  08/15/18 83.9 kg  05/21/18 85.7 kg     Intake/Output Summary (Last 24 hours) at 08/17/2018 1644 Last data filed at 08/17/2018 1300 Gross per 24 hour  Intake 2920 ml  Output 1850 ml  Net 1070 ml     Physical Exam Patient is examined daily including today on 08/17/18  , exams remain the same as of yesterday except that has changed   Gen:- Awake Alert,  In no apparent distress  HEENT:- [place].AT, No sclera icterus Neck-Supple Neck,No JVD,.  Lungs-  CTAB , good air movement CV- S1, S2 normal, regular Abd-  +ve B.Sounds, Abd Soft, generalized abdominal discomfort without rebound or guarding,  increased truncal adiposity Extremity/Skin:- No  edema,   good pulses Psych-affect is appropriate, oriented x3 Neuro-no new focal deficits, no tremors   Data Review:   Micro Results Recent Results (from the past 240 hour(s))  Gastrointestinal Panel by PCR , Stool     Status: Abnormal   Collection Time: 08/16/18  1:49 AM  Result Value Ref Range Status   Campylobacter species NOT DETECTED NOT DETECTED Final   Plesimonas shigelloides NOT DETECTED NOT DETECTED Final   Salmonella species NOT DETECTED NOT DETECTED Final   Yersinia enterocolitica NOT DETECTED NOT DETECTED Final   Vibrio species NOT DETECTED NOT DETECTED Final   Vibrio cholerae NOT DETECTED NOT DETECTED Final   Enteroaggregative E coli (EAEC) NOT DETECTED NOT DETECTED Final    Enteropathogenic E coli (EPEC) DETECTED (A) NOT DETECTED Final    Comment: RESULT CALLED TO, READ BACK BY AND VERIFIED WITH: CASSIDY GANOE 08/16/18 @ 1522  MLK    Enterotoxigenic E coli (ETEC) NOT DETECTED NOT DETECTED Final   Shiga like toxin producing E coli (STEC) NOT DETECTED NOT DETECTED Final   Shigella/Enteroinvasive E coli (EIEC) NOT DETECTED NOT DETECTED Final   Cryptosporidium NOT DETECTED NOT DETECTED Final   Cyclospora cayetanensis NOT DETECTED NOT DETECTED Final   Entamoeba histolytica NOT DETECTED NOT DETECTED Final  Giardia lamblia NOT DETECTED NOT DETECTED Final   Adenovirus F40/41 NOT DETECTED NOT DETECTED Final   Astrovirus NOT DETECTED NOT DETECTED Final   Norovirus GI/GII NOT DETECTED NOT DETECTED Final   Rotavirus A NOT DETECTED NOT DETECTED Final   Sapovirus (I, II, IV, and V) NOT DETECTED NOT DETECTED Final    Comment: Performed at [name], [address] [place], [place] [address]  C difficile quick scan w PCR reflex     Status: None   Collection Time: 08/16/18  1:49 AM  Result Value Ref Range Status   C Diff antigen NEGATIVE NEGATIVE Final   C Diff toxin NEGATIVE NEGATIVE Final   C Diff interpretation No C. difficile detected.  Final    Comment: Performed at [facility], [place] [address] [place], [place] [address]    Radiology Reports Ct Abdomen Pelvis Wo Contrast  Result Date: 08/15/2018 CLINICAL DATA:  Low-grade bowel obstruction. Nausea, diarrhea, and cramping for 4 days EXAM: CT ABDOMEN AND PELVIS WITHOUT CONTRAST TECHNIQUE: Multidetector CT imaging of the abdomen and pelvis was performed following the standard protocol without IV contrast. COMPARISON:  None. FINDINGS: Lower chest:  Extensive coronary atherosclerosis. Hepatobiliary: Hepatic steatosis without focal abnormality.The gallbladder is absent. No ductal dilatation. Pancreas: Unremarkable. Spleen: Unremarkable. Adrenals/Urinary Tract: Negative adrenals. No hydronephrosis or  stone. Unremarkable bladder. Stomach/Bowel: Contrast has only reached the proximal small bowel. There are proximal small bowel fluid levels and ileal loops are decompressed, but no discrete transition point is seen and there is no fecalization. Fluid is also seen throughout the colon, reaching the sigmoid, and correlating with history of diarrhea. No detected bowel wall thickening. Appendectomy. Vascular/Lymphatic: Atherosclerotic calcification. No mass or adenopathy. Reproductive:Negative. Other: No ascites or pneumoperitoneum. Calcified nodule in the left lower quadrant attributed to old inflammation. Remote bilateral inguinal hernia repair. Musculoskeletal: Advanced spondylosis and degenerative disc narrowing with posterior element hypertrophy. No acute osseous finding. IMPRESSION: 1. There is proximal small bowel fluid levels and distal small bowel collapse but no transition point is seen and there is fluid throughout the colon. Enteritis and diarrheal illness is favored over partial small bowel obstruction. Radiographs could be used to follow the oral contrast through the bowel if continued clinical concern for obstruction. 2. Prominent hepatic steatosis. 3. Atherosclerosis that is extensive in the coronaries. Electronically Signed   By: Monte Fantasia M.D.   On: 08/15/2018 16:37   Dg Abd 2 Views  Result Date: 08/15/2018 CLINICAL DATA:  Abdominal pain for several days EXAM: ABDOMEN - 2 VIEW COMPARISON:  None. FINDINGS: Scattered large and small bowel gas is noted. A few mildly prominent loops of small bowel are noted with air-fluid levels. This may be related to the patient's given clinical history of diarrhea although the possibility of a partial small bowel obstruction could not be totally excluded. The need for further evaluation can be determined on a clinical basis. IMPRESSION: Mildly dilated small bowel with air-fluid levels. Correlation with the clinical exam is recommended. This may represent a  partial small bowel obstruction. Electronically Signed   By: Inez Catalina M.D.   On: 08/15/2018 11:00     CBC Recent Labs  Lab 08/15/18 1023 08/15/18 1213 08/15/18 1836  WBC 10.5 10.4 8.3  HGB 14.1 13.7 13.0  HCT 41.0 42.6 39.9  PLT 199 196 151  MCV 90 93.4 94.3  MCH 31.0 30.0 30.7  MCHC 34.4 32.2 32.6  RDW 13.5 14.3 14.4  LYMPHSABS 1.5  --   --  EOSABS 0.0  --   --   BASOSABS 0.0  --   --     Chemistries  Recent Labs  Lab 08/15/18 1023 08/15/18 1213 08/15/18 1846 08/16/18 0451 08/17/18 0407  NA 133* 132* 133* 138 137  K 4.7 4.1 3.8 3.5 3.5  CL 95* 102 104 107 113*  CO2 12* 15* 14* 15* 16*  GLUCOSE 267* 239* 142* 67* 87  BUN 105* 106* 109* 108* 84*  CREATININE 7.78* 8.07* 7.26* 6.58* 3.81*  CALCIUM 8.9 8.7* 8.1* 7.9* 7.5*  AST 12 19  --   --   --   ALT 18 21  --   --   --   ALKPHOS 47 41  --   --   --   BILITOT 0.5 0.8  --   --   --    ------------------------------------------------------------------------------------------------------------------ No results for input(s): CHOL, HDL, LDLCALC, TRIG, CHOLHDL, LDLDIRECT in the last 72 hours.  Lab Results  Component Value Date   HGBA1C 6.9 12/28/2015   ------------------------------------------------------------------------------------------------------------------ No results for input(s): TSH, T4TOTAL, T3FREE, THYROIDAB in the last 72 hours.  Invalid input(s): FREET3 ------------------------------------------------------------------------------------------------------------------ No results for input(s): VITAMINB12, FOLATE, FERRITIN, TIBC, IRON, RETICCTPCT in the last 72 hours.  Coagulation profile No results for input(s): INR, PROTIME in the last 168 hours.  No results for input(s): DDIMER in the last 72 hours.  Cardiac Enzymes No results for input(s): CKMB, TROPONINI, MYOGLOBIN in the last 168 hours.  Invalid input(s):  CK ------------------------------------------------------------------------------------------------------------------ No results found for: BNP   Roxan Hockey M.D on 08/17/2018 at 4:44 PM  Pager---[phone] Go to [email or web address] - password [email or web address] for contact info  [facility] - Office  [phone]

## 2018-08-18 LAB — BASIC METABOLIC PANEL
Anion gap: 8 (ref 5–15)
Anion gap: 9 (ref 5–15)
BUN: 57 mg/dL — ABNORMAL HIGH (ref 8–23)
BUN: 48 mg/dL — ABNORMAL HIGH (ref 8–23)
CO2: 17 mmol/L — ABNORMAL LOW (ref 22–32)
CO2: 17 mmol/L — ABNORMAL LOW (ref 22–32)
Calcium: 8 mg/dL — ABNORMAL LOW (ref 8.9–10.3)
Calcium: 8.5 mg/dL — ABNORMAL LOW (ref 8.9–10.3)
Chloride: 115 mmol/L — ABNORMAL HIGH (ref 98–111)
Chloride: 115 mmol/L — ABNORMAL HIGH (ref 98–111)
Creatinine, Ser: 2.62 mg/dL — ABNORMAL HIGH (ref 0.61–1.24)
Creatinine, Ser: 2.38 mg/dL — ABNORMAL HIGH (ref 0.61–1.24)
GFR calc Af Amer: 25 mL/min — ABNORMAL LOW (ref >60)
GFR calc Af Amer: 29 mL/min — ABNORMAL LOW (ref >60)
GFR calc non Af Amer: 22 mL/min — ABNORMAL LOW (ref >60)
GFR calc non Af Amer: 25 mL/min — ABNORMAL LOW (ref >60)
Glucose, Bld: 159 mg/dL — ABNORMAL HIGH (ref 70–99)
Glucose, Bld: 141 mg/dL — ABNORMAL HIGH (ref 70–99)
Potassium: 3.7 mmol/L (ref 3.5–5.1)
Potassium: 3.8 mmol/L (ref 3.5–5.1)
Sodium: 140 mmol/L (ref 135–145)
Sodium: 141 mmol/L (ref 135–145)

## 2018-08-18 LAB — CBC
HCT: 31.7 % — ABNORMAL LOW (ref 39.0–52.0)
Hemoglobin: 10.7 g/dL — ABNORMAL LOW (ref 13.0–17.0)
MCH: 31.1 pg (ref 26.0–34.0)
MCHC: 33.8 g/dL (ref 30.0–36.0)
MCV: 92.2 fL (ref 78.0–100.0)
Platelets: 88 10*3/uL — ABNORMAL LOW (ref 150–400)
RBC: 3.44 MIL/uL — ABNORMAL LOW (ref 4.22–5.81)
RDW: 13.9 % (ref 11.5–15.5)
WBC: 4 10*3/uL (ref 4.0–10.5)

## 2018-08-18 LAB — GLUCOSE, CAPILLARY
Glucose-Capillary: 145 mg/dL — ABNORMAL HIGH (ref 70–99)
Glucose-Capillary: 215 mg/dL — ABNORMAL HIGH (ref 70–99)

## 2018-08-18 MED ORDER — AMLODIPINE BESYLATE 10 MG PO TABS: 10.0000 mg | ORAL_TABLET | Freq: Every day | ORAL | 2 refills | Status: DC

## 2018-08-18 MED ORDER — METOPROLOL SUCCINATE ER 50 MG PO TB24: ORAL_TABLET | ORAL | 1 refills | Status: DC

## 2018-08-18 MED ORDER — TAMSULOSIN HCL 0.4 MG PO CAPS: 0.4000 mg | ORAL_CAPSULE | Freq: Every day | ORAL | 1 refills | Status: DC

## 2018-08-18 MED ORDER — AZITHROMYCIN 500 MG PO TABS: 500.0000 mg | ORAL_TABLET | Freq: Every day | ORAL | 0 refills | Status: DC

## 2018-08-18 MED ORDER — LACTINEX PO CHEW: 1.0000 | CHEWABLE_TABLET | Freq: Three times a day (TID) | ORAL | 0 refills | Status: AC

## 2018-08-18 MED ORDER — SITAGLIPTIN PHOSPHATE 50 MG PO TABS: 25.0000 mg | ORAL_TABLET | Freq: Every day | ORAL | 3 refills | Status: DC

## 2018-08-18 MED ORDER — SITAGLIPTIN PHOSPHATE 50 MG PO TABS: 50.0000 mg | ORAL_TABLET | Freq: Every day | ORAL | 3 refills | Status: DC

## 2018-08-18 MED ORDER — SODIUM BICARBONATE 650 MG PO TABS: 650.0000 mg | ORAL_TABLET | Freq: Three times a day (TID) | ORAL | 0 refills | Status: AC

## 2018-08-18 NOTE — Discharge Summary (Signed)
Timothy [name], is a 77 y.o. male  DOB 10-14-1941  MRN [id number].  Admission date:  08/15/2018  Admitting Physician  Janora Norlander, MD  Discharge Date:  08/18/2018   Primary MD  Chipper Herb, MD  Recommendations for primary care physician for things to follow:   1) repeat BMP/kidney test on Monday, 08/25/2018 with your primary care doctor 2)Avoid ibuprofen/Advil/Aleve/Motrin/Goody Powders/Naproxen/BC powders/Meloxicam/Diclofenac/Indomethacin and other Nonsteroidal anti-inflammatory medications as these will make you more likely to bleed and can cause stomach ulcers, can also cause Kidney problems.  3)STOP olmesartan and metformin due to kidney concerns,  4)okay to take amlodipine 10 mg daily for blood pressure and Januvia 25 mg daily for diabetes 4)STOP  HCTZ   due to kidney concerns   Admission Diagnosis  Enteritis [K52.9] Acute kidney injury superimposed on chronic kidney disease ([facility]) [N17.9, N18.9]   Discharge Diagnosis  Enteritis [K52.9] Acute kidney injury superimposed on chronic kidney disease ([place]) [N17.9, N18.9]    Principal Problem:   Enteritis Active Problems:   Type 2 diabetes mellitus with diabetic nephropathy (HCC)   Hyperlipidemia LDL goal <70   Coronary atherosclerosis   Essential hypertension, benign   CKD (chronic kidney disease) stage 3, GFR 30-59 ml/min (HCC)   AKI (acute kidney injury) ([place])      Past Medical History:  Diagnosis Date  . Cataract   . Chronic kidney disease   . Colon polyp   . Coronary artery disease    (left main normal, LAD 25-30% stenosis, distal 30-40% stenosis, circumflex obtuse marginal 50% stenosis,  right coronary artery dominant with long 75% followed by mid 80%  stenosis followed by 9% stenosis at the ostium of the PDA.  He had  stenting of the PDA by Dr. [name].  This was a Cypher stent.   First angioplasty was 1994, Cath 2011 100% RCA ).        . Diabetes mellitus without complication ([place])   . Essential hypertension, benign   . Glaucoma   . Hyperlipidemia   . Hyperplasia of prostate   . 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 Melanoma ([place])    melanoma both sides face, right ear  . Myocardial infarction ([place])    per pt/ had mild heart attacks/has 3 stents  . OSA (obstructive sleep apnea) 08/17/2016  . Sleep apnea    no c-pap    Past Surgical History:  Procedure Laterality Date  . APPENDECTOMY    . CERVICAL SPINE SURGERY    . CHOLECYSTECTOMY    . COLONOSCOPY  2004   multiple since  . CORONARY ANGIOPLASTY WITH STENT PLACEMENT     3 sents  . EYE SURGERY Bilateral    cataracts  . HERNIA REPAIR Bilateral   . SKIN CANCER EXCISION  03/17/2017     HPI  from the history and physical done on the day of admission:    Chief Complaint: Abdominal pain, N/V  HPI: Timothy [name] is a 77 y.o. male with medical history significant  for T2DM, HLD, CAD, HTN, B12 deficiency, CKD III, who presented to the ED today with c/o  4 days abd pain, N/V and diarrhea.  He was in his usual state of health until late Monday night when he went to bed with a vague stomachache.  Around 2 AM he was woken up by sudden nausea and had an episode of nonbilious nonbloody emesis. He has continued to have recurrent nausea and vomiting since then.  He has been able to maintain some fluids and kept down egg sandwich this morning.  He is also had episodic diarrhea since Wednesday.  He states he had one episode of melena on Wednesday which is now resolved.  He says he has about a spoonful of yellowish-brown liquidy stool 4-5 times a day.  He is only urinating about 2 times a day and his urine is dark.  He went to his primary care doctor today who did plain films which showed possible partial small bowel obstruction.  He was therefore sent to the ED. He cannot recall any suspect foods he might have eaten that may be the culprit. He has had no  sick contacts.  He states he has had no fevers but has been having more chills at night.  ED Course: Given 2 500 cc boluses, zofran 4 mg CT of the abdomen and pelvis showed proximal small bowel fluid levels and a distal small bowel collapse but no transition point and there was fluid seen throughout the colon.  Favor enteritis and diarrheal illness over partial small bowel obstructio     Hospital Course:   Brief Summary 77 year old male with  pmhx T2DM, HLD, CAD, HTN,B12 deficiency, CKD III admitted on 08/15/2018 with worsening renal function in the setting of nausea vomiting abdominal pain and diarrhea for 5 days, CT abdomen and pelvis with enteritis type findings, no obstructive findings.   Plan:- 1)Enteropathogenic E. coli Enteritis----patient and his son had similar symptoms within 24 hours after eating breakfast that included sausage,  biscuit bacon at [employer], discussed with on-call infectious disease specialist Dr. Megan Salon, treated with  azithromycin 500 mg daily for 3 days, improved,  tolerating soft GI diet well  2)AKI----acute kidney injury on CKD stage - III, AKI secondary to dehydration in the setting of significant GI losses with vomiting and diarrhea,  creatinine on admission= 7.78 ,   baseline creatinine = 1.8   , creatinine is now=  2.38 (peak 8.0) , renally adjust medications, avoid nephrotoxic agents/dehydration/hypotension, STopped Olmesartan also  HCTZ and metformin, responded well to IV fluids, okay to complete additional 5 days of sodium bicarb tablets, urine output has been pretty good  3)DM2-A1c apparently has been under 7, hold metformin due to kidney concerns  4)HTN--- we have stopped olmesartan and HCTz due to kidney concerns, continue Toprol-XL 50 mg daily, amlodipine 10 mg daily,     Code Status : full  Disposition Plan  : home   Consults  : Phone consult with Dr. Megan Salon  from infectious disease due to E. coli enteritis  Discharge Condition:  stable  Follow UP---PCP for repeat BMP  Diet and Activity recommendation:  As advised  Discharge Instructions    Discharge Instructions    Call MD for:  difficulty breathing, headache or visual disturbances   Complete by:  As directed    Call MD for:  persistant dizziness or light-headedness   Complete by:  As directed    Call MD for:  persistant nausea and vomiting   Complete by:  As directed    Call MD for:  severe uncontrolled pain   Complete by:  As directed    Call MD for:  temperature >100.4   Complete by:  As directed    Diet - low sodium heart healthy   Complete by:  As directed    Diet Carb Modified   Complete by:  As directed    Discharge instructions   Complete by:  As directed    1) repeat BMP/kidney test on Monday, 08/25/2018 with your primary care doctor 2)Avoid ibuprofen/Advil/Aleve/Motrin/Goody Powders/Naproxen/BC powders/Meloxicam/Diclofenac/Indomethacin and other Nonsteroidal anti-inflammatory medications as these will make you more likely to bleed and can cause stomach ulcers, can also cause Kidney problems.  3)STOP olmesartan and metformin due to kidney concerns,  4)okay to take amlodipine 10 mg daily for blood pressure and Januvia 25 mg daily for diabetes 4)STOP  HCTZ   due to kidney concerns   Increase activity slowly   Complete by:  As directed         Discharge Medications     Allergies as of 08/18/2018      Reactions   Hydrocodone-acetaminophen Other (See Comments)   Makes pt. Feel loopy      Medication List    STOP taking these medications   amLODipine-olmesartan 5-40 MG tablet Commonly known as:  AZOR   hydrochlorothiazide 25 MG tablet Commonly known as:  HYDRODIURIL   JANUMET XR 50-500 MG Tb24 Generic drug:  SitaGLIPtin-MetFORMIN HCl     TAKE these medications   amLODipine 10 MG tablet Commonly known as:  NORVASC Take 1 tablet (10 mg total) by mouth daily.   aspirin 81 MG EC tablet Take 81 mg by mouth daily.   azithromycin 500  MG tablet Commonly known as:  ZITHROMAX Take 1 tablet (500 mg total) by mouth daily for 2 days. Start taking on:  08/19/2018   esomeprazole 40 MG capsule Commonly known as:  [place]ell (1) CAPSULE DAILY What changed:  See the new instructions.   Fenofibric Acid 135 MG Cpdr TAKE (1) CAPSULE DAILY What changed:  See the new instructions.   glimepiride 4 MG tablet Commonly known as:  AMARYL TAKE (1) TABLET TWICE A DAY. What changed:  See the new instructions.   lactobacillus acidophilus & bulgar chewable tablet Chew 1 tablet by mouth 3 (three) times daily with meals for 10 days.   latanoprost 0.005 % ophthalmic solution Commonly known as:  XALATAN Place 1 drop into both eyes at bedtime.   metoprolol succinate 50 MG 24 hr tablet Commonly known as:  TOPROL-XL TAKE 1 TABLET ONCE A DAY WITH A MEAL What changed:  See the new instructions.   MYRBETRIQ 50 MG Tb24 tablet Generic drug:  mirabegron ER Take 50 mg by mouth daily.   nitroGLYCERIN 0.4 MG SL tablet Commonly known as:  NITROSTAT Place 1 tablet (0.4 mg total) under the tongue every 5 (five) minutes as needed.   PROBIOTIC DAILY PO Take 1 application by mouth daily at 12 noon.   rosuvastatin 10 MG tablet Commonly known as:  CRESTOR TAKE 1 TABLET DAILY What changed:  when to take this   sitaGLIPtin 50 MG tablet Commonly known as:  JANUVIA Take 0.5 tablets (25 mg total) by mouth daily. For diabetes   sodium bicarbonate 650 MG tablet Take 1 tablet (650 mg total) by mouth 3 (three) times daily for 5 days.   VASCEPA 1 g Caps Generic drug:  Icosapent Ethyl TAKE (2) CAPSULES TWICE DAILY. What changed:  See the new instructions.       Major procedures and Radiology Reports - PLEASE review detailed and final reports for all details, in brief -   Ct Abdomen Pelvis Wo Contrast  Result Date: 08/15/2018 CLINICAL DATA:  Low-grade bowel obstruction. Nausea, diarrhea, and cramping for 4 days EXAM: CT ABDOMEN AND PELVIS  WITHOUT CONTRAST TECHNIQUE: Multidetector CT imaging of the abdomen and pelvis was performed following the standard protocol without IV contrast. COMPARISON:  None. FINDINGS: Lower chest:  Extensive coronary atherosclerosis. Hepatobiliary: Hepatic steatosis without focal abnormality.The gallbladder is absent. No ductal dilatation. Pancreas: Unremarkable. Spleen: Unremarkable. Adrenals/Urinary Tract: Negative adrenals. No hydronephrosis or stone. Unremarkable bladder. Stomach/Bowel: Contrast has only reached the proximal small bowel. There are proximal small bowel fluid levels and ileal loops are decompressed, but no discrete transition point is seen and there is no fecalization. Fluid is also seen throughout the colon, reaching the sigmoid, and correlating with history of diarrhea. No detected bowel wall thickening. Appendectomy. Vascular/Lymphatic: Atherosclerotic calcification. No mass or adenopathy. Reproductive:Negative. Other: No ascites or pneumoperitoneum. Calcified nodule in the left lower quadrant attributed to old inflammation. Remote bilateral inguinal hernia repair. Musculoskeletal: Advanced spondylosis and degenerative disc narrowing with posterior element hypertrophy. No acute osseous finding. IMPRESSION: 1. There is proximal small bowel fluid levels and distal small bowel collapse but no transition point is seen and there is fluid throughout the colon. Enteritis and diarrheal illness is favored over partial small bowel obstruction. Radiographs could be used to follow the oral contrast through the bowel if continued clinical concern for obstruction. 2. Prominent hepatic steatosis. 3. Atherosclerosis that is extensive in the coronaries. Electronically Signed   By: Monte Fantasia M.D.   On: 08/15/2018 16:37   Dg Abd 2 Views  Result Date: 08/15/2018 CLINICAL DATA:  Abdominal pain for several days EXAM: ABDOMEN - 2 VIEW COMPARISON:  None. FINDINGS: Scattered large and small bowel gas is noted. A few  mildly prominent loops of small bowel are noted with air-fluid levels. This may be related to the patient's given clinical history of diarrhea although the possibility of a partial small bowel obstruction could not be totally excluded. The need for further evaluation can be determined on a clinical basis. IMPRESSION: Mildly dilated small bowel with air-fluid levels. Correlation with the clinical exam is recommended. This may represent a partial small bowel obstruction. Electronically Signed   By: Inez Catalina M.D.   On: 08/15/2018 11:00    Micro Results    Recent Results (from the past 240 hour(s))  Gastrointestinal Panel by PCR , Stool     Status: Abnormal   Collection Time: 08/16/18  1:49 AM  Result Value Ref Range Status   Campylobacter species NOT DETECTED NOT DETECTED Final   Plesimonas shigelloides NOT DETECTED NOT DETECTED Final   Salmonella species NOT DETECTED NOT DETECTED Final   Yersinia enterocolitica NOT DETECTED NOT DETECTED Final   Vibrio species NOT DETECTED NOT DETECTED Final   Vibrio cholerae NOT DETECTED NOT DETECTED Final   Enteroaggregative E coli (EAEC) NOT DETECTED NOT DETECTED Final   Enteropathogenic E coli (EPEC) DETECTED (A) NOT DETECTED Final    Comment: RESULT CALLED TO, READ BACK BY AND VERIFIED WITH: CASSIDY GANOE 08/16/18 @ 1522  MLK    Enterotoxigenic E coli (ETEC) NOT DETECTED NOT DETECTED Final   Shiga like toxin producing E coli (STEC) NOT DETECTED NOT DETECTED Final   Shigella/Enteroinvasive E coli (EIEC) NOT DETECTED NOT DETECTED Final   Cryptosporidium NOT DETECTED NOT  DETECTED Final   Cyclospora cayetanensis NOT DETECTED NOT DETECTED Final   Entamoeba histolytica NOT DETECTED NOT DETECTED Final   Giardia lamblia NOT DETECTED NOT DETECTED Final   Adenovirus F40/41 NOT DETECTED NOT DETECTED Final   Astrovirus NOT DETECTED NOT DETECTED Final   Norovirus GI/GII NOT DETECTED NOT DETECTED Final   Rotavirus A NOT DETECTED NOT DETECTED Final   Sapovirus  (I, II, IV, and V) NOT DETECTED NOT DETECTED Final    Comment: Performed at [facility], [address] [place], [place] [address]  C difficile quick scan w PCR reflex     Status: None   Collection Time: 08/16/18  1:49 AM  Result Value Ref Range Status   C Diff antigen NEGATIVE NEGATIVE Final   C Diff toxin NEGATIVE NEGATIVE Final   C Diff interpretation No C. difficile detected.  Final    Comment: Performed at [facility], [place] [address] [place], [place] [address]       Today   Subjective    Timothy [name] today has no new concerns,  Eating and drinking okay, voiding well, no further diarrhea, no fevers no chest pains        Patient has been seen and examined prior to discharge   Objective   Blood pressure (!) 142/64, pulse 74, temperature 97.9 F (36.6 C), temperature source Oral, resp. rate 19, height 5' 7.5" (1.715 m), weight 88 kg, SpO2 99 %.   Intake/Output Summary (Last 24 hours) at 08/18/2018 1620 Last data filed at 08/18/2018 1155 Gross per 24 hour  Intake 660 ml  Output 4250 ml  Net -3590 ml    Exam  Gen:- Awake Alert,  In no apparent distress  HEENT:- [place].AT, No sclera icterus Neck-Supple Neck,No JVD,.  Lungs-  CTAB , good air movement CV- S1, S2 normal, regular Abd-  +ve B.Sounds, Abd Soft, no significant abdominal tenderness, increased truncal adiposity Extremity/Skin:- No  edema,   good pulses Psych-affect is appropriate, oriented x3 Neuro-no new focal deficits, no tremors   Data Review   CBC w Diff:  Lab Results  Component Value Date   WBC 4.0 08/18/2018   HGB 10.7 (L) 08/18/2018   HGB 14.1 08/15/2018   HCT 31.7 (L) 08/18/2018   HCT 41.0 08/15/2018   PLT 88 (L) 08/18/2018   PLT 199 08/15/2018   LYMPHOPCT 31.4 06/22/2010   MONOPCT 6.5 06/22/2010   EOSPCT 0.9 06/22/2010   BASOPCT 0.3 06/22/2010    CMP:  Lab Results  Component Value Date   NA 141 08/18/2018   NA 133 (L) 08/15/2018   K 3.8 08/18/2018   CL 115 (H)  08/18/2018   CO2 17 (L) 08/18/2018   BUN 48 (H) 08/18/2018   BUN 105 (HH) 08/15/2018   CREATININE 2.38 (H) 08/18/2018   PROT 7.2 08/15/2018   PROT 7.4 08/15/2018   ALBUMIN 4.0 08/15/2018   ALBUMIN 4.4 08/15/2018   BILITOT 0.8 08/15/2018   BILITOT 0.5 08/15/2018   ALKPHOS 41 08/15/2018   AST 19 08/15/2018   ALT 21 08/15/2018  .   Total Discharge time is about 33 minutes  Roxan Hockey M.D on 08/18/2018 at 4:20 PM  Pager---[phone]  Go to [email or web address] - password [email or web address] for contact info  [facility] - Office  [phone]

## 2018-08-18 NOTE — Discharge Instructions (Signed)
1) repeat BMP/kidney test on Monday, 08/25/2018 with your primary care doctor 2)Avoid ibuprofen/Advil/Aleve/Motrin/Goody Powders/Naproxen/BC powders/Meloxicam/Diclofenac/Indomethacin and other Nonsteroidal anti-inflammatory medications as these will make you more likely to bleed and can cause stomach ulcers, can also cause Kidney problems.  3)STOP olmesartan and metformin due to kidney concerns,  4)okay to take amlodipine 10 mg daily for blood pressure and Januvia 25 mg daily for diabetes 4)STOP  HCTZ   due to kidney concerns  \ Enteropathogenic E. Coli (EPEC) Enteropathogenic Escherichia coli (EPEC) is a gram-negative bacterial pathogen that adheres to intestinal epithelial cells, causing diarrhoea. It constitutes a significant risk to human health and remains an important cause of infant mortality in developing countries. Escherichia coli (E. coli) bacteria normally live in the intestines of healthy people and animals. Most varieties of E. coli are harmless or cause relatively brief diarrhea. But a few particularly nasty strains, such as E. coli, can cause severe abdominal cramps, bloody diarrhea and vomiting.  You may be exposed to E. coli from contaminated water or food -- especially raw vegetables and undercooked ground beef. Healthy adults usually recover from infection with E. coli within a week, but young children and older adults have a greater risk of developing a life-threatening form of kidney failure called hemolytic uremic syndrome. Signs and symptoms of E. coli infection typically begin three or four days after exposure to the bacteria, though you may become ill as soon as one day after to more than a week later. Signs and symptoms include: Diarrhea, which may range from mild and watery to severe and bloody Abdominal cramping, pain or tenderness Nausea and vomiting, in some people Causes Among the many strains of E. coli, only a few trigger diarrhea. One group of E. coli -- which  includes -- produces a powerful toxin that damages the lining of the small intestine, which can cause bloody diarrhea. You develop an E. coli infection when you ingest this strain of bacteria. Unlike many other disease-causing bacteria, E. coli can cause an infection even if you ingest only small amounts. Because of this, you can be sickened by E. coli from eating a slightly undercooked hamburger or from swallowing a mouthful of contaminated pool water. Potential sources of exposure include contaminated food or water and person-to-person contact. Contaminated food The most common way to acquire an E. coli infection is by eating contaminated food, such as: Ground beef. When cattle are slaughtered and processed, E. coli bacteria in their intestines can get on the meat. Ground beef combines meat from many different cattle, increasing the risk of contamination. Unpasteurized milk. E. coli bacteria on a cow's udder or on milking equipment can get into raw milk. Fresh produce. Runoff from cattle farms can contaminate fields where fresh produce is grown. Certain vegetables, such as spinach and lettuce, are particularly vulnerable to this type of contamination. Contaminated water Human and animal feces may pollute ground and surface water, including streams, rivers, lakes and water used to irrigate crops. Although public water systems use chlorine, ultraviolet light or ozone to kill E. coli, some outbreaks have been linked to contaminated municipal water supplies. Private wells are a greater cause for concern because they don't often have any disinfecting system. Rural water supplies are the most likely to be contaminated. Some people also have been infected after swimming in pools or lakes contaminated with feces. Personal contact E. coli bacteria can easily travel from person to person, especially when infected adults and children don't wash their hands properly. Family members of  young children with E. coli  infection are especially likely to acquire it themselves. Outbreaks have also occurred among children visiting petting zoos and in animal barns at [employer].  1) repeat BMP/kidney test on Monday, 08/25/2018 with your primary care doctor 2)Avoid ibuprofen/Advil/Aleve/Motrin/Goody Powders/Naproxen/BC powders/Meloxicam/Diclofenac/Indomethacin and other Nonsteroidal anti-inflammatory medications as these will make you more likely to bleed and can cause stomach ulcers, can also cause Kidney problems.  3)STOP olmesartan and metformin due to kidney concerns,  4)okay to take amlodipine 10 mg daily for blood pressure and Januvia 25 mg daily for diabetes 4)STOP  HCTZ   due to kidney concerns

## 2018-08-19 ENCOUNTER — Other Ambulatory Visit: Payer: Self-pay | Admitting: Family Medicine

## 2018-08-19 ENCOUNTER — Telehealth: Payer: Self-pay | Admitting: *Deleted

## 2018-08-20 ENCOUNTER — Encounter: Payer: Self-pay | Admitting: Family Medicine

## 2018-08-20 NOTE — Telephone Encounter (Signed)
OV 08/25/18

## 2018-08-21 ENCOUNTER — Ambulatory Visit: Payer: Medicare HMO | Admitting: Pediatrics

## 2018-08-21 ENCOUNTER — Encounter: Payer: Self-pay | Admitting: Pediatrics

## 2018-08-21 VITALS — BP 169/81 | HR 74 | Temp 97.4°F | Ht 67.5 in | Wt 197.8 lb

## 2018-08-21 DIAGNOSIS — R609 Edema, unspecified: Secondary | ICD-10-CM | POA: Diagnosis not present

## 2018-08-21 DIAGNOSIS — I10 Essential (primary) hypertension: Secondary | ICD-10-CM

## 2018-08-21 DIAGNOSIS — N179 Acute kidney failure, unspecified: Secondary | ICD-10-CM | POA: Diagnosis not present

## 2018-08-21 MED ORDER — CARVEDILOL 12.5 MG PO TABS: 12.5000 mg | ORAL_TABLET | Freq: Two times a day (BID) | ORAL | 2 refills | Status: DC

## 2018-08-21 NOTE — Patient Instructions (Signed)
Stop metoprolol Take carvedilol twice a day

## 2018-08-21 NOTE — Progress Notes (Signed)
  Subjective:   Patient ID: Timothy [name], male    DOB: 1941-10-06, 77 y.o.   MRN: [id number] CC: Edema (Bilateral feet, hands and face)  HPI: Timothy [name] is a 77 y.o. male   Here today with his son.  Recently admitted for enteritis and acute on chronic kidney injury.  Baseline creatinine 1.7-1.8.  At admission creatinine up to 8.  He was given fluids, GI symptoms controlled.  Enteritis was treated with azithromycin.  GI symptoms have completely resolved now.  He says his appetite is now completely back to normal.  Discharge from hospital 2 days ago.  Yesterday son noticed more swelling in his lower legs and knees.  During hospitalization medications were changed due to acute kidney injury, blood pressure medicines including olmesartan and hydrochlorthiazide were held.  He has still been taking amlodipine and metoprolol at home.  Has a blood pressure cuff at home, is not sure what his blood pressures been for the last couple days.  No shortness of breath or trouble breathing.  Relevant past medical, surgical, family and social history reviewed. Allergies and medications reviewed and updated. Social History   Tobacco Use  Smoking Status Former Smoker  . Packs/day: 2.00  . Years: 25.00  . Pack years: 50.00  . Types: Cigarettes  . Last attempt to quit: 08/14/1991  . Years since quitting: 27.0  Smokeless Tobacco Never Used   ROS: Per HPI   Objective:    BP (!) 169/81   Pulse 74   Temp (!) 97.4 F (36.3 C) (Oral)   Ht 5' 7.5" (1.715 m)   Wt 197 lb 12.8 oz (89.7 kg)   BMI 30.52 kg/m   Wt Readings from Last 3 Encounters:  08/21/18 197 lb 12.8 oz (89.7 kg)  08/18/18 194 lb 0.1 oz (88 kg)  08/15/18 185 lb (83.9 kg)    Gen: NAD, alert, cooperative with exam, NCAT EYES: EOMI, no conjunctival injection, or no icterus ENT:OP without erythema CV: NRRR, normal S1/S2, no murmur, distal pulses 2+ b/l Resp: CTABL, no wheezes, normal WOB Abd: +BS, soft, NTND. no guarding or  organomegaly Ext: Trace pitting edema bilateral ankles, warm Neuro: Alert and oriented, strength equal b/l UE and LE, coordination grossly normal MSK: normal muscle bulk  Assessment & Plan:  Timothy [name] was seen today for edema.  Diagnoses and all orders for this visit:  AKI (acute kidney injury) ([place]) -     BMP8+EGFR  Essential hypertension Elevated off of olmesartan and hydrochlorthiazide.  Continue amlodipine.  Stop metoprolol.  Start carvedilol for slightly improved blood pressure control.  Check blood pressure twice a day.  Has follow-up appointment early next week with his primary care doctor. -     carvedilol (COREG) 12.5 MG tablet; Take 1 tablet (12.5 mg total) by mouth 2 (two) times daily with a meal.  Edema, unspecified type Suspect will continue to improve with good blood pressure control over the next few days.  Received several liters fluid resuscitation while in the hospital, likely contributing to edema now.  Follow up plan: Return if symptoms worsen or fail to improve. Assunta Found, MD [place]

## 2018-08-22 LAB — BMP8+EGFR
BUN/Creatinine Ratio: 10 (ref 10–24)
BUN: 21 mg/dL (ref 8–27)
CO2: 22 mmol/L (ref 20–29)
Calcium: 8.9 mg/dL (ref 8.6–10.2)
Chloride: 106 mmol/L (ref 96–106)
Creatinine, Ser: 2.13 mg/dL — ABNORMAL HIGH (ref 0.76–1.27)
GFR calc Af Amer: 33 mL/min/{1.73_m2} — ABNORMAL LOW (ref >59)
GFR calc non Af Amer: 29 mL/min/{1.73_m2} — ABNORMAL LOW (ref >59)
Glucose: 180 mg/dL — ABNORMAL HIGH (ref 65–99)
Potassium: 4.1 mmol/L (ref 3.5–5.2)
Sodium: 144 mmol/L (ref 134–144)

## 2018-08-22 NOTE — Telephone Encounter (Signed)
Erroneous encounter

## 2018-08-25 ENCOUNTER — Encounter: Payer: Self-pay | Admitting: Family Medicine

## 2018-08-25 ENCOUNTER — Ambulatory Visit: Payer: Medicare HMO | Admitting: Family Medicine

## 2018-08-25 VITALS — BP 157/82 | HR 78 | Temp 97.0°F | Ht 67.5 in | Wt 193.0 lb

## 2018-08-25 DIAGNOSIS — R609 Edema, unspecified: Secondary | ICD-10-CM

## 2018-08-25 DIAGNOSIS — Z09 Encounter for follow-up examination after completed treatment for conditions other than malignant neoplasm: Secondary | ICD-10-CM | POA: Diagnosis not present

## 2018-08-25 DIAGNOSIS — I10 Essential (primary) hypertension: Secondary | ICD-10-CM

## 2018-08-25 DIAGNOSIS — N183 Chronic kidney disease, stage 3 unspecified: Secondary | ICD-10-CM

## 2018-08-25 NOTE — Progress Notes (Signed)
Subjective:    Patient ID: Timothy [name], male    DOB: 11-09-1941, 77 y.o.   MRN: [id number]  HPI Pt here today for hospital follow up from [facility] where he was admitted from 08/15/18-08/18/18. His primary diagnosis was enteritis and acute kidney injury.  The patient has been in the hospital recently because of a protracted gastroenteritis with dehydration and acute kidney injury.  He recently finished a Z-Pak.  He is on both Myrbetriq and Flomax.  He has some recent edema and metoprolol was stopped but Coreg was started on 9 5.  We will recheck the blood pressure for a second reading.  Outside readings were brought in today and the majority of these are running in the 1 50-1 60 range for the systolic readings but lower for the diastolic readings.  He is on both Myrbetriq and Flomax.  Not sure why he is on both of these medicines.  Today we will get a CBC LFTs and BMP.  She is currently taking Coreg 12.5 twice daily instead of metoprolol.  He is not taking any more metformin or HCTZ.  The blood pressures he brings in as already mentioned or somewhat elevated.  He is feeling better he is eating and drinking and watching his sodium intake and not taking any NSAIDs.  His stools are formed and normal colored.  He denies any chest pain pressure tightness or shortness of breath.  He is weak from the episode of enteritis and elevated creatinine.  But he is feeling better.  He is passing his water without problems.  We will discontinue the Flomax and keep on with the Myrbetriq.  The patient has seen Dr. Mercy Moore, the nephrologist in the past.    Patient Active Problem List   Diagnosis Date Noted  . Enteritis 08/15/2018  . AKI (acute kidney injury) ([place]) 08/15/2018  . Thrombocytopenia ([place]) 09/23/2016  . Elevated triglycerides with high cholesterol 10/18/2015  . CKD (chronic kidney disease) stage 3, GFR 30-59 ml/min (HCC) 05/13/2015  . Glaucoma 12/27/2014  . Overweight(278.02) 06/27/2011  . ASCVD  (arteriosclerotic cardiovascular disease)   . Essential hypertension, benign   . Hyperplasia of prostate   . 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 Type 2 diabetes mellitus with diabetic nephropathy ([place]) 05/24/2010  . Hyperlipidemia LDL goal <70 05/13/2009  . Coronary atherosclerosis 05/13/2009   Outpatient Encounter Medications as of 08/25/2018  Medication Sig  . amLODipine (NORVASC) 10 MG tablet Take 1 tablet (10 mg total) by mouth daily.  Marland Kitchen aspirin 81 MG EC tablet Take 81 mg by mouth daily.    . carvedilol (COREG) 12.5 MG tablet Take 1 tablet (12.5 mg total) by mouth 2 (two) times daily with a meal.  . Choline Fenofibrate (FENOFIBRIC ACID) 135 MG CPDR TAKE (1) CAPSULE DAILY (Patient taking differently: Take 135 mg by mouth at bedtime. TAKE (1) CAPSULE DAILY)  . esomeprazole (NEXIUM) 40 MG capsule TAKE (1) CAPSULE DAILY (Patient taking differently: Take 40 mg by mouth daily at 12 noon. TAKE (1) CAPSULE DAILY)  . glimepiride (AMARYL) 4 MG tablet TAKE (1) TABLET TWICE A DAY.  Marland Kitchen lactobacillus acidophilus & bulgar (LACTINEX) chewable tablet Chew 1 tablet by mouth 3 (three) times daily with meals for 10 days.  Marland Kitchen latanoprost (XALATAN) 0.005 % ophthalmic solution Place 1 drop into both eyes at bedtime.  Marland Kitchen MYRBETRIQ 50 MG TB24 tablet Take 50 mg by mouth daily.  . nitroGLYCERIN (NITROSTAT) 0.4 MG SL tablet Place 1  tablet (0.4 mg total) under the tongue every 5 (five) minutes as needed.  . Probiotic Product (PROBIOTIC DAILY PO) Take 1 application by mouth daily at 12 noon.  . rosuvastatin (CRESTOR) 10 MG tablet TAKE 1 TABLET DAILY (Patient taking differently: Take 10 mg by mouth at bedtime. )  . sitaGLIPtin (JANUVIA) 50 MG tablet Take 0.5 tablets (25 mg total) by mouth daily. For diabetes  . tamsulosin (FLOMAX) 0.4 MG CAPS capsule   . VASCEPA 1 g CAPS TAKE (2) CAPSULES TWICE DAILY. (Patient taking differently: Take 2 g by mouth as needed. )   No facility-administered  encounter medications on file as of 08/25/2018.      Review of Systems  Constitutional: Negative.   HENT: Negative.   Eyes: Negative.   Respiratory: Negative.   Cardiovascular: Positive for leg swelling.  Gastrointestinal: Negative.   Endocrine: Negative.   Genitourinary: Negative.   Musculoskeletal: Negative.   Skin: Negative.   Allergic/Immunologic: Negative.   Neurological: Positive for weakness (getting strength back ).  Hematological: Negative.   Psychiatric/Behavioral: Negative.        Objective:   Physical Exam  Constitutional: He is oriented to person, place, and time. He appears well-developed and well-nourished. No distress.  Comes to the visit today with his wife who is a retired Therapist, sports.  He is pleasant and alert and doing much better.  HENT:  Head: Normocephalic and atraumatic.  Eyes: Pupils are equal, round, and reactive to light. Conjunctivae and EOM are normal. Right eye exhibits no discharge. Left eye exhibits no discharge. No scleral icterus.  Neck: Normal range of motion. Neck supple. No thyromegaly present.  Cardiovascular: Normal rate, regular rhythm and normal heart sounds.  No murmur heard. The heart is regular at 72/min with diminished pedal pulses on the right compared to the left.  Pulmonary/Chest: Effort normal and breath sounds normal. He has no wheezes. He has no rales. He exhibits no tenderness.  Clear anteriorly and posteriorly and no axillary adenopathy  Abdominal: Soft. Bowel sounds are normal. He exhibits no mass. There is no tenderness. There is no rebound and no guarding.  Musculoskeletal: Normal range of motion. He exhibits edema.  Bilateral pedal edema no pretibial edema.  Lymphadenopathy:    He has no cervical adenopathy.  Neurological: He is alert and oriented to person, place, and time. He has normal reflexes.  Skin: Skin is warm and dry. No rash noted.  Psychiatric: He has a normal mood and affect. His behavior is normal. Judgment and  thought content normal.  Mood affect and behavior were all normal for this patient.  Nursing note and vitals reviewed.   BP (!) 157/82 (BP Location: Left Arm)   Pulse 78   Temp (!) 97 F (36.1 C) (Oral)   Ht 5' 7.5" (1.715 m)   Wt 193 lb (87.5 kg)   BMI 29.78 kg/m        Assessment & Plan:  1. Hospital discharge follow-up -Check blood pressure readings at home and consider increasing Coreg by 1/2 pill twice daily if they remain elevated in the 150s. - CBC with Differential/Platelet - Hepatic function panel - BMP8+EGFR  2. Essential hypertension -Continue with current medicines pending results of future readings.  3. Edema, unspecified type -Wear support hose if the patient can find any from home or at least a light-duty pair of hose that are supportive that should be put on both feet the first thing when getting out of the bed in the morning.  4.  CKD (chronic kidney disease) stage 3, GFR 30-59 ml/min (HCC) -With nephrology if needed and if not a continued improvement with the creatinine.  Patient Instructions  Continue with water and fluids Continue with sugar and sodium restriction Continue with current medications Have wife check blood pressure and if the systolic readings remain elevated in the 150s we would want to increase the Coreg 1-1/2 pills twice daily until the patient is rechecked in 2 weeks. Call with the blood pressure readings in 1 week. The patient should keep his appointment with the cardiologist as planned If he needs to go see the nephrologist, he has seen Dr. Mercy Moore in the past.  Arrie Senate MD

## 2018-08-25 NOTE — Patient Instructions (Signed)
Continue with water and fluids Continue with sugar and sodium restriction Continue with current medications Have wife check blood pressure and if the systolic readings remain elevated in the 150s we would want to increase the Coreg 1-1/2 pills twice daily until the patient is rechecked in 2 weeks. Call with the blood pressure readings in 1 week. The patient should keep his appointment with the cardiologist as planned If he needs to go see the nephrologist, he has seen Dr. Mercy Moore in the past.

## 2018-08-26 LAB — CBC WITH DIFFERENTIAL/PLATELET
Basophils Absolute: 0 10*3/uL (ref 0.0–0.2)
Basos: 1 %
EOS (ABSOLUTE): 0.1 10*3/uL (ref 0.0–0.4)
Eos: 3 %
Hematocrit: 33.7 % — ABNORMAL LOW (ref 37.5–51.0)
Hemoglobin: 11.5 g/dL — ABNORMAL LOW (ref 13.0–17.7)
Immature Grans (Abs): 0 10*3/uL (ref 0.0–0.1)
Immature Granulocytes: 1 %
Lymphocytes Absolute: 1.5 10*3/uL (ref 0.7–3.1)
Lymphs: 28 %
MCH: 31.3 pg (ref 26.6–33.0)
MCHC: 34.1 g/dL (ref 31.5–35.7)
MCV: 92 fL (ref 79–97)
Monocytes Absolute: 0.4 10*3/uL (ref 0.1–0.9)
Monocytes: 8 %
Neutrophils Absolute: 3.2 10*3/uL (ref 1.4–7.0)
Neutrophils: 59 %
Platelets: 151 10*3/uL (ref 150–450)
RBC: 3.68 x10E6/uL — ABNORMAL LOW (ref 4.14–5.80)
RDW: 13.6 % (ref 12.3–15.4)
WBC: 5.3 10*3/uL (ref 3.4–10.8)

## 2018-08-26 LAB — HEPATIC FUNCTION PANEL
ALT: 20 IU/L (ref 0–44)
AST: 20 IU/L (ref 0–40)
Albumin: 3.9 g/dL (ref 3.5–4.8)
Alkaline Phosphatase: 40 IU/L (ref 39–117)
Bilirubin Total: <0.2 mg/dL (ref 0.0–1.2)
Bilirubin, Direct: 0.1 mg/dL (ref 0.00–0.40)
Total Protein: 6.3 g/dL (ref 6.0–8.5)

## 2018-08-26 LAB — BMP8+EGFR
BUN/Creatinine Ratio: 9 — ABNORMAL LOW (ref 10–24)
BUN: 17 mg/dL (ref 8–27)
CO2: 23 mmol/L (ref 20–29)
Calcium: 8.8 mg/dL (ref 8.6–10.2)
Chloride: 103 mmol/L (ref 96–106)
Creatinine, Ser: 1.9 mg/dL — ABNORMAL HIGH (ref 0.76–1.27)
GFR calc Af Amer: 38 mL/min/{1.73_m2} — ABNORMAL LOW (ref >59)
GFR calc non Af Amer: 33 mL/min/{1.73_m2} — ABNORMAL LOW (ref >59)
Glucose: 165 mg/dL — ABNORMAL HIGH (ref 65–99)
Potassium: 3.6 mmol/L (ref 3.5–5.2)
Sodium: 142 mmol/L (ref 134–144)

## 2018-08-29 ENCOUNTER — Telehealth: Payer: Self-pay | Admitting: Family Medicine

## 2018-08-29 NOTE — Telephone Encounter (Signed)
Dr Laurance Flatten, what do you think about an iron supplement.

## 2018-08-31 NOTE — Telephone Encounter (Signed)
The Hgb has improved since he was in the hospital and I would suspect that it will continue to do so on its own hopefully back to what it was before he got sick, as his renal function improves. Just repeat the CBC in 4-6 weeks.

## 2018-09-01 NOTE — Telephone Encounter (Signed)
PT AWARE  

## 2018-09-09 ENCOUNTER — Ambulatory Visit: Payer: Medicare HMO | Admitting: Family Medicine

## 2018-09-09 ENCOUNTER — Encounter: Payer: Self-pay | Admitting: Family Medicine

## 2018-09-09 VITALS — BP 140/60 | HR 80 | Temp 96.8°F | Ht 67.5 in | Wt 194.0 lb

## 2018-09-09 DIAGNOSIS — I10 Essential (primary) hypertension: Secondary | ICD-10-CM

## 2018-09-09 DIAGNOSIS — N179 Acute kidney failure, unspecified: Secondary | ICD-10-CM

## 2018-09-09 DIAGNOSIS — N183 Chronic kidney disease, stage 3 unspecified: Secondary | ICD-10-CM

## 2018-09-09 MED ORDER — FUROSEMIDE 20 MG PO TABS: 10.0000 mg | ORAL_TABLET | Freq: Every day | ORAL | 3 refills | Status: DC

## 2018-09-09 NOTE — Progress Notes (Signed)
Subjective:    Patient ID: Timothy [name], male    DOB: 27-Mar-1941, 77 y.o.   MRN: [id number]  HPI Patient here today for 2 week follow up on HTN and edema. He states that his BP is still running high most of the time, but the edema is some better.  Weight is 194 today previously was 193.  Blood pressure by the nurse was 129/65 but the patient says his blood pressures at home of been running in the 150 over the 70 range.  The patient was in the hospital for gastroenteritis and went into acute renal failure because of that.  The most recent creatinine was 1.90.  Baseline is lower than this.  The most recent CBC had a white count that was normal and a hemoglobin that remains slightly decreased at 11.5.  Patient continues to feel better though home blood pressure readings are still running in the 150 range.  The reading that I got today was 140/60.  The nurse got 129/65.  The patient still having some slight pretibial edema.  He denies any chest pain or problems with his bowels.  His bowels are formed now.  He denies any trouble with passing his water.    Patient Active Problem List   Diagnosis Date Noted  . Enteritis 08/15/2018  . AKI (acute kidney injury) ([place]) 08/15/2018  . Thrombocytopenia ([place]) 09/23/2016  . Elevated triglycerides with high cholesterol 10/18/2015  . CKD (chronic kidney disease) stage 3, GFR 30-59 ml/min (HCC) 05/13/2015  . Glaucoma 12/27/2014  . Overweight(278.02) 06/27/2011  . ASCVD (arteriosclerotic cardiovascular disease)   . Essential hypertension, benign   . Hyperplasia of prostate   . 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 Type 2 diabetes mellitus with diabetic nephropathy ([place]) 05/24/2010  . Hyperlipidemia LDL goal <70 05/13/2009  . Coronary atherosclerosis 05/13/2009   Outpatient Encounter Medications as of 09/09/2018  Medication Sig  . amLODipine (NORVASC) 10 MG tablet Take 1 tablet (10 mg total) by mouth daily.  Marland Kitchen aspirin 81 MG EC  tablet Take 81 mg by mouth daily.    . carvedilol (COREG) 12.5 MG tablet Take 1 tablet (12.5 mg total) by mouth 2 (two) times daily with a meal.  . Choline Fenofibrate (FENOFIBRIC ACID) 135 MG CPDR TAKE (1) CAPSULE DAILY (Patient taking differently: Take 135 mg by mouth at bedtime. TAKE (1) CAPSULE DAILY)  . esomeprazole (NEXIUM) 40 MG capsule TAKE (1) CAPSULE DAILY (Patient taking differently: Take 40 mg by mouth daily at 12 noon. TAKE (1) CAPSULE DAILY)  . glimepiride (AMARYL) 4 MG tablet TAKE (1) TABLET TWICE A DAY.  Marland Kitchen latanoprost (XALATAN) 0.005 % ophthalmic solution Place 1 drop into both eyes at bedtime.  Marland Kitchen MYRBETRIQ 50 MG TB24 tablet Take 50 mg by mouth daily.  . nitroGLYCERIN (NITROSTAT) 0.4 MG SL tablet Place 1 tablet (0.4 mg total) under the tongue every 5 (five) minutes as needed.  . Probiotic Product (PROBIOTIC DAILY PO) Take 1 application by mouth daily at 12 noon.  . rosuvastatin (CRESTOR) 10 MG tablet TAKE 1 TABLET DAILY (Patient taking differently: Take 10 mg by mouth at bedtime. )  . sitaGLIPtin (JANUVIA) 50 MG tablet Take 0.5 tablets (25 mg total) by mouth daily. For diabetes  . tamsulosin (FLOMAX) 0.4 MG CAPS capsule   . VASCEPA 1 g CAPS TAKE (2) CAPSULES TWICE DAILY. (Patient taking differently: Take 2 g by mouth as needed. )   No facility-administered encounter medications on file as  of 09/09/2018.       Review of Systems  Constitutional: Negative.   HENT: Negative.   Eyes: Negative.   Respiratory: Negative.   Cardiovascular: Positive for leg swelling (some - better ).       BP runs avg of 150/70  Gastrointestinal: Negative.   Endocrine: Negative.   Genitourinary: Negative.   Musculoskeletal: Negative.   Skin: Negative.   Allergic/Immunologic: Negative.   Neurological: Negative.   Hematological: Negative.   Psychiatric/Behavioral: Negative.        Objective:   Physical Exam  Constitutional: He is oriented to person, place, and time. He appears well-developed  and well-nourished. No distress.  HENT:  Head: Normocephalic and atraumatic.  Eyes: Pupils are equal, round, and reactive to light. Conjunctivae and EOM are normal. Right eye exhibits no discharge. Left eye exhibits no discharge. No scleral icterus.  Neck: Normal range of motion.  Cardiovascular: Normal rate, regular rhythm and normal heart sounds.  No murmur heard. Pulmonary/Chest: Effort normal and breath sounds normal. He has no wheezes. He has no rales.  Musculoskeletal: Normal range of motion. He exhibits edema.  Minimal pretibial edema bilaterally  Neurological: He is alert and oriented to person, place, and time.  Skin: Skin is warm and dry. No rash noted.  Psychiatric: He has a normal mood and affect. His behavior is normal. Judgment and thought content normal.  Nursing note and vitals reviewed.   BP 129/65 (BP Location: Left Arm)   Pulse 80   Temp (!) 96.8 F (36 C) (Oral)   Ht 5' 7.5" (1.715 m)   Wt 194 lb (88 kg)   BMI 29.94 kg/m        Assessment & Plan:  1. AKI (acute kidney injury) ([place]) -This seems to be resolving following his hospitalization and acute enteritis that went on for several days.  2. CKD (chronic kidney disease) stage 3, GFR 30-59 ml/min (HCC) -The recent creatinine was lower and we will check another BMP today.  3. Essential hypertension -Home blood pressures are still running in the 150 range and when I checked it here today manually I got 140/60 with a large cuff in the right arm sitting.  We will add Lasix 20 mg one half daily and have him come by in a couple weeks for another blood pressure check and BMP.  If his blood pressure starts running a lot lower we can reduce the Coreg to 12 and half milligrams twice daily again.  Meds ordered this encounter  Medications  . furosemide (LASIX) 20 MG tablet    Sig: Take 0.5 tablets (10 mg total) by mouth daily.    Dispense:  30 tablet    Refill:  3   Patient Instructions  Add Lasix 20 mg 1/2 pill  daily and have the patient continue to check blood pressure readings at home watch his salt intake and return to the office in a couple weeks for repeat blood pressure and BMP at that time. If the blood pressure runs too low, the patient can reduce the Coreg back to 12-1/2 mg twice daily and stay on the Lasix 20 one half daily.   Arrie Senate MD

## 2018-09-09 NOTE — Patient Instructions (Addendum)
Add Lasix 20 mg 1/2 pill daily and have the patient continue to check blood pressure readings at home watch his salt intake and return to the office in a couple weeks for repeat blood pressure and BMP at that time. If the blood pressure runs too low, the patient can reduce the Coreg back to 12-1/2 mg twice daily and stay on the Lasix 20 one half daily.

## 2018-09-10 ENCOUNTER — Other Ambulatory Visit: Payer: Self-pay | Admitting: *Deleted

## 2018-09-10 DIAGNOSIS — E1122 Type 2 diabetes mellitus with diabetic chronic kidney disease: Secondary | ICD-10-CM

## 2018-09-10 DIAGNOSIS — N183 Chronic kidney disease, stage 3 unspecified: Secondary | ICD-10-CM

## 2018-09-10 DIAGNOSIS — I10 Essential (primary) hypertension: Secondary | ICD-10-CM

## 2018-09-10 LAB — CBC WITH DIFFERENTIAL/PLATELET
Basophils Absolute: 0 10*3/uL (ref 0.0–0.2)
Basos: 1 %
EOS (ABSOLUTE): 0.1 10*3/uL (ref 0.0–0.4)
Eos: 2 %
Hematocrit: 36.7 % — ABNORMAL LOW (ref 37.5–51.0)
Hemoglobin: 11.9 g/dL — ABNORMAL LOW (ref 13.0–17.7)
Immature Grans (Abs): 0 10*3/uL (ref 0.0–0.1)
Immature Granulocytes: 0 %
Lymphocytes Absolute: 1.9 10*3/uL (ref 0.7–3.1)
Lymphs: 36 %
MCH: 30.1 pg (ref 26.6–33.0)
MCHC: 32.4 g/dL (ref 31.5–35.7)
MCV: 93 fL (ref 79–97)
Monocytes Absolute: 0.5 10*3/uL (ref 0.1–0.9)
Monocytes: 9 %
Neutrophils Absolute: 2.8 10*3/uL (ref 1.4–7.0)
Neutrophils: 52 %
Platelets: 165 10*3/uL (ref 150–450)
RBC: 3.96 x10E6/uL — ABNORMAL LOW (ref 4.14–5.80)
RDW: 14.5 % (ref 12.3–15.4)
WBC: 5.3 10*3/uL (ref 3.4–10.8)

## 2018-09-10 LAB — BMP8+EGFR
BUN/Creatinine Ratio: 9 — ABNORMAL LOW (ref 10–24)
BUN: 17 mg/dL (ref 8–27)
CO2: 21 mmol/L (ref 20–29)
Calcium: 9.2 mg/dL (ref 8.6–10.2)
Chloride: 103 mmol/L (ref 96–106)
Creatinine, Ser: 1.8 mg/dL — ABNORMAL HIGH (ref 0.76–1.27)
GFR calc Af Amer: 41 mL/min/{1.73_m2} — ABNORMAL LOW (ref >59)
GFR calc non Af Amer: 36 mL/min/{1.73_m2} — ABNORMAL LOW (ref >59)
Glucose: 173 mg/dL — ABNORMAL HIGH (ref 65–99)
Potassium: 3.8 mmol/L (ref 3.5–5.2)
Sodium: 142 mmol/L (ref 134–144)

## 2018-09-15 ENCOUNTER — Other Ambulatory Visit: Payer: Self-pay | Admitting: *Deleted

## 2018-09-15 MED ORDER — SITAGLIPTIN PHOSPHATE 25 MG PO TABS: 25.0000 mg | ORAL_TABLET | Freq: Every day | ORAL | 3 refills | Status: DC

## 2018-09-16 ENCOUNTER — Other Ambulatory Visit: Payer: Self-pay | Admitting: Family Medicine

## 2018-09-24 ENCOUNTER — Ambulatory Visit (INDEPENDENT_AMBULATORY_CARE_PROVIDER_SITE_OTHER): Payer: Medicare HMO | Admitting: *Deleted

## 2018-09-24 DIAGNOSIS — E1122 Type 2 diabetes mellitus with diabetic chronic kidney disease: Secondary | ICD-10-CM | POA: Diagnosis not present

## 2018-09-24 DIAGNOSIS — Z23 Encounter for immunization: Secondary | ICD-10-CM | POA: Diagnosis not present

## 2018-09-24 DIAGNOSIS — I10 Essential (primary) hypertension: Secondary | ICD-10-CM | POA: Diagnosis not present

## 2018-09-24 DIAGNOSIS — N183 Chronic kidney disease, stage 3 unspecified: Secondary | ICD-10-CM

## 2018-09-24 LAB — BMP8+EGFR
BUN/Creatinine Ratio: 8 — ABNORMAL LOW (ref 10–24)
BUN: 17 mg/dL (ref 8–27)
CO2: 20 mmol/L (ref 20–29)
Calcium: 9.2 mg/dL (ref 8.6–10.2)
Chloride: 100 mmol/L (ref 96–106)
Creatinine, Ser: 2.06 mg/dL — ABNORMAL HIGH (ref 0.76–1.27)
GFR calc Af Amer: 35 mL/min/{1.73_m2} — ABNORMAL LOW (ref >59)
GFR calc non Af Amer: 30 mL/min/{1.73_m2} — ABNORMAL LOW (ref >59)
Glucose: 256 mg/dL — ABNORMAL HIGH (ref 65–99)
Potassium: 4 mmol/L (ref 3.5–5.2)
Sodium: 139 mmol/L (ref 134–144)

## 2018-09-29 NOTE — Progress Notes (Signed)
HPI The patient presents for one year followup. Since I last saw him he was in the hospital in August with enteritis and dehydration.  He had AKI.  I reviewed these records for this visit.    He was sent home with out HCTZ and his ARB.   Creat did improve from 2.38 to 2.06 on 10/9.     He said he has been weak since this time.  He is doing a little work around the house but is not been pushing his mower, working on his cabinet doors or doing his yard work for his woodwork.  He denies any overt symptoms such as chest discomfort, neck or arm discomfort.  He said no new shortness of breath, PND or orthopnea.  He has had no palpitations, presyncope or syncope.  He has had no weight gain or edema.    Allergies  Allergen Reactions  . Hydrocodone-Acetaminophen Other (See Comments)    Makes pt. Feel loopy    Current Outpatient Medications  Medication Sig Dispense Refill  . amLODipine (NORVASC) 10 MG tablet Take 1 tablet (10 mg total) by mouth daily. 30 tablet 2  . aspirin 81 MG EC tablet Take 81 mg by mouth daily.      . carvedilol (COREG) 12.5 MG tablet Take 1 tablet (12.5 mg total) by mouth 2 (two) times daily with a meal. 60 tablet 2  . Choline Fenofibrate (FENOFIBRIC ACID) 135 MG CPDR TAKE (1) CAPSULE DAILY (Patient taking differently: Take 135 mg by mouth at bedtime. TAKE (1) CAPSULE DAILY) 90 capsule 1  . esomeprazole (NEXIUM) 40 MG capsule TAKE (1) CAPSULE DAILY (Patient taking differently: Take 40 mg by mouth daily at 12 noon. TAKE (1) CAPSULE DAILY) 90 capsule 1  . furosemide (LASIX) 20 MG tablet Take 0.5 tablets (10 mg total) by mouth daily. 30 tablet 3  . glimepiride (AMARYL) 4 MG tablet TAKE (1) TABLET TWICE A DAY. 60 tablet 2  . latanoprost (XALATAN) 0.005 % ophthalmic solution Place 1 drop into both eyes at bedtime.    Marland Kitchen MYRBETRIQ 50 MG TB24 tablet Take 50 mg by mouth daily.    . nitroGLYCERIN (NITROSTAT) 0.4 MG SL tablet Place 1 tablet (0.4 mg total) under the tongue every 5  (five) minutes as needed. 25 tablet 3  . Probiotic Product (PROBIOTIC DAILY PO) Take 1 application by mouth daily at 12 noon.    . rosuvastatin (CRESTOR) 10 MG tablet TAKE 1 TABLET DAILY (Patient taking differently: Take 10 mg by mouth at bedtime. ) 90 tablet 1  . sitaGLIPtin (JANUVIA) 25 MG tablet Take 1 tablet (25 mg total) by mouth daily. For diabetes 90 tablet 3  . tamsulosin (FLOMAX) 0.4 MG CAPS capsule Take 0.4 mg by mouth daily.     Marland Kitchen VASCEPA 1 g CAPS TAKE (2) CAPSULES TWICE DAILY. (Patient taking differently: Take 2 g by mouth as needed. ) 120 capsule 5   No current facility-administered medications for this visit.     Past Medical History:  Diagnosis Date  . Cataract   . Chronic kidney disease   . Colon polyp   . Coronary artery disease    (left main normal, LAD 25-30% stenosis, distal 30-40% stenosis, circumflex obtuse marginal 50% stenosis,  right coronary artery dominant with long 75% followed by mid 80%  stenosis followed by 9% stenosis at the ostium of the PDA.  He had  stenting of the PDA by Dr. [name].  This was a Cypher stent.  First angioplasty was 1994, Cath 2011 100% RCA ).      . Diabetes mellitus without complication ([place])   . Essential hypertension, benign   . Glaucoma   . Hyperlipidemia   . Hyperplasia of prostate   . 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 Melanoma ([place])    melanoma both sides face, right ear  . Myocardial infarction ([place])    per pt/ had mild heart attacks/has 3 stents  . OSA (obstructive sleep apnea) 08/17/2016  . Sleep apnea    no c-pap    Past Surgical History:  Procedure Laterality Date  . APPENDECTOMY    . CERVICAL SPINE SURGERY    . CHOLECYSTECTOMY    . COLONOSCOPY  2004   multiple since  . CORONARY ANGIOPLASTY WITH STENT PLACEMENT     3 sents  . EYE SURGERY Bilateral    cataracts  . HERNIA REPAIR Bilateral   . SKIN CANCER EXCISION  03/17/2017    ROS:  As stated in the HPI and negative for all other  systems.  PHYSICAL EXAM BP 140/64   Pulse 82   Ht 5' 7.5" (1.715 m)   Wt 195 lb (88.5 kg)   BMI 30.09 kg/m   GENERAL:  Well appearing NECK:  No jugular venous distention, waveform within normal limits, carotid upstroke brisk and symmetric, no bruits, no thyromegaly LUNGS:  Clear to auscultation bilaterally CHEST:  Unremarkable HEART:  PMI not displaced or sustained,S1 and S2 within normal limits, no S3, no S4, no clicks, no rubs, no murmurs ABD:  Flat, positive bowel sounds normal in frequency in pitch, no bruits, no rebound, no guarding, no midline pulsatile mass, no hepatomegaly, no splenomegaly EXT:  2 plus pulses throughout, no edema, no cyanosis no clubbing    EKG: Sinus rhythm, rate 82, axis within normal limits, intervals within normal limits, nonspecific ST-T wave changes, early transition in lead V2 , no change from previous. PVCs  10/01/2018  Lab Results  Component Value Date   CHOL 147 04/22/2018   TRIG 510 (H) 04/22/2018   HDL 18 (L) 04/22/2018   [place] Comment 04/22/2018   LDLDIRECT 60 02/17/2016    ASSESSMENT AND PLAN  CAD -  The patient has no new sypmtoms.  No further cardiovascular testing is indicated.  We will continue with aggressive risk reduction and meds as listed.  Essential hypertension, benign -  The blood pressure is at target barely.  However, with his recent worsening renal function and fatigue I will not titrate meds at this point.   DYSLIPIDEMIA -  Triglycerides were still elevated as above.  Despite being on 2 medications his triglycerides remain elevated.  I am to have him come down to see Dr. [name] in our [facility].   SLEEP APNEA - He could not afford CPAP.   CKD STAGE III - Creat as above.   Follow.    DM - A1C was 6.8.  Continue current therapy.  Hospital records reviewed and chart updated.

## 2018-10-01 ENCOUNTER — Ambulatory Visit (INDEPENDENT_AMBULATORY_CARE_PROVIDER_SITE_OTHER): Payer: Medicare HMO | Admitting: Cardiology

## 2018-10-01 ENCOUNTER — Encounter: Payer: Self-pay | Admitting: Cardiology

## 2018-10-01 VITALS — BP 140/64 | HR 82 | Ht 67.5 in | Wt 195.0 lb

## 2018-10-01 DIAGNOSIS — N184 Chronic kidney disease, stage 4 (severe): Secondary | ICD-10-CM

## 2018-10-01 DIAGNOSIS — I2510 Atherosclerotic heart disease of native coronary artery without angina pectoris: Secondary | ICD-10-CM | POA: Diagnosis not present

## 2018-10-01 DIAGNOSIS — I10 Essential (primary) hypertension: Secondary | ICD-10-CM

## 2018-10-01 DIAGNOSIS — E118 Type 2 diabetes mellitus with unspecified complications: Secondary | ICD-10-CM | POA: Diagnosis not present

## 2018-10-01 DIAGNOSIS — E781 Pure hyperglyceridemia: Secondary | ICD-10-CM | POA: Diagnosis not present

## 2018-10-01 NOTE — Patient Instructions (Signed)
Medication Instructions:  The current medical regimen is effective;  continue present plan and medications.  If you need a refill on your cardiac medications before your next appointment, please call your pharmacy.   You have been referred to Dr [name] for the evaluation and treatment of Hypertriglyceridemia.   You will be contacted to schedule an appointment for this.  If for some reason you are not contacted by out office, please call [phone] to schedule.  Follow-Up: . Follow up in 6 months with Dr. Percival Spanish in [place].  Please contact out office to schedule your April 2020 appointment with Dr Percival Spanish in [place] in February.  Thank you for choosing [facility]!!

## 2018-10-09 ENCOUNTER — Encounter: Payer: Self-pay | Admitting: Internal Medicine

## 2018-10-09 ENCOUNTER — Ambulatory Visit: Payer: Medicare HMO | Admitting: Internal Medicine

## 2018-10-09 VITALS — BP 138/62 | HR 82 | Ht 67.5 in | Wt 196.0 lb

## 2018-10-09 DIAGNOSIS — E785 Hyperlipidemia, unspecified: Secondary | ICD-10-CM | POA: Insufficient documentation

## 2018-10-09 DIAGNOSIS — I2510 Atherosclerotic heart disease of native coronary artery without angina pectoris: Secondary | ICD-10-CM

## 2018-10-09 DIAGNOSIS — N184 Chronic kidney disease, stage 4 (severe): Secondary | ICD-10-CM | POA: Diagnosis not present

## 2018-10-09 DIAGNOSIS — E1169 Type 2 diabetes mellitus with other specified complication: Secondary | ICD-10-CM | POA: Insufficient documentation

## 2018-10-09 DIAGNOSIS — E782 Mixed hyperlipidemia: Secondary | ICD-10-CM | POA: Diagnosis not present

## 2018-10-09 DIAGNOSIS — E781 Pure hyperglyceridemia: Secondary | ICD-10-CM | POA: Diagnosis not present

## 2018-10-09 MED ORDER — ICOSAPENT ETHYL 1 G PO CAPS: 2.0000 g | ORAL_CAPSULE | Freq: Two times a day (BID) | ORAL | 11 refills | Status: DC

## 2018-10-09 NOTE — Patient Instructions (Addendum)
Medication Instructions:  Continue current medications Take vascepa 2 gram (2 capsules) by mouth twice daily -- 5 boxes of samples provided  If you need a refill on your cardiac medications before your next appointment, please call your pharmacy.   Lab work: FASTING lab work as soon as you are able - to check cholesterol This can be done at your primary care provider's office or any [facility] that is convenient for you  FASTING lab work in 3-4 months to recheck cholesterol (prior to next visit with Dr. [name])  If you have labs (blood work) drawn today and your tests are completely normal, you will receive your results only by: Marland Kitchen MyChart Message (if you have MyChart) OR . A paper copy in the mail If you have any lab test that is abnormal or we need to change your treatment, we will call you to review the results.  Testing/Procedures: NONE  Follow-Up: At [facility], you and your health needs are our priority.  As part of our continuing mission to provide you with exceptional heart care, we have created designated Provider Care Teams.  These Care Teams include your primary Cardiologist (physician) and Advanced Practice Providers (APPs -  Physician Assistants and Nurse Practitioners) who all work together to provide you with the care you need, when you need it. You will need a follow up appointment in 3 - 4 months with Dr. [name] (lipid clinic)  Any Other Special Instructions Will Be Listed Below (If Applicable).

## 2018-10-09 NOTE — Progress Notes (Signed)
OFFICE CONSULT NOTE  Chief Complaint:  Manage dyslipidemia  Primary Care Physician: Timothy Herb, MD  HPI:  Timothy [name] is a 77 y.o. male who is being seen today for the evaluation of dyslipidemia at the request of Timothy File, MD. This is a pleasant 77 year old male patient of Dr. Percival [name] who sees Dr. Lynnette [name] his primary care provider.  He is referred for evaluation and management of dyslipidemia.  He has a strong history of coronary artery disease with multiple heart catheterizations and prior coronary intervention, dating back to 53.  He also has chronic kidney disease, hypertension and type 2 diabetes.  He had significant dyslipidemia with primary elevated LDL, triglycerides and low HDL less than 20.  This is a typical diabetic or metabolic dyslipidemia.  Recent labs were 5 months ago indicating a total cholesterol 147, triglycerides 510, HDL 18 and LDL was not calculated.  He also had a lipoprotein NMR 2 years ago which showed LDL particle number of 1229 and triglycerides again over 500 with a high small LDL particle number of 1086.  Timothy [name] has a history of intolerance to atorvastatin which caused hot flashes.  He is currently on rosuvastatin 10 mg but has not tried higher doses.  He also is written to take fenofibrate 135 mg daily and Vascepa 2 g twice daily.  After much discussion it seems that he is not regularly taking the Vascepa.  He said initially was that they are larger capsules and did not fit in his pillbox therefore he has a separate container but often forgets to take the medication.  Review of filled prescriptions show that he did not get a scription filled for Vascepa since 2017.  We do know that he was getting some samples from his primary care provider however the suggest that compliance with the medication is not where it needs to be.  He does report somewhat of an atherogenic diet and denies any routine alcohol use.  With regards to A1c, this was most recently 6.8,  from 6.110 months ago.  PMHx:  Past Medical History:  Diagnosis Date  . Cataract   . Chronic kidney disease   . Colon polyp   . Coronary artery disease    (left main normal, LAD 25-30% stenosis, distal 30-40% stenosis, circumflex obtuse marginal 50% stenosis,  right coronary artery dominant with long 75% followed by mid 80%  stenosis followed by 9% stenosis at the ostium of the PDA.  He had  stenting of the PDA by Dr. [name].  This was a Cypher stent.   First angioplasty was 1994, Cath 2011 100% RCA ).      . Diabetes mellitus without complication ([place])   . Essential hypertension, benign   . Glaucoma   . Hyperlipidemia   . Hyperplasia of prostate   . 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 Melanoma ([place])    melanoma both sides face, right ear  . Myocardial infarction ([place])    per pt/ had mild heart attacks/has 3 stents  . OSA (obstructive sleep apnea) 08/17/2016  . Sleep apnea    no c-pap    Past Surgical History:  Procedure Laterality Date  . APPENDECTOMY    . CERVICAL SPINE SURGERY    . CHOLECYSTECTOMY    . COLONOSCOPY  2004   multiple since  . CORONARY ANGIOPLASTY WITH STENT PLACEMENT     3 sents  . EYE SURGERY Bilateral    cataracts  . HERNIA REPAIR  Bilateral   . SKIN CANCER EXCISION  03/17/2017    FAMHx:  Family History  Problem Relation Age of Onset  . Cancer Mother        unsure of type  . Glaucoma Mother   . Heart disease Father   . Coronary artery disease Father   . Cancer Sister 55       granlocytosis (Wergerner's)  . Colon cancer Brother 34  . Nephritis Sister        20's  . Cancer Brother        lung     SOCHx:   reports that he quit smoking about 27 years ago. His smoking use included cigarettes. He has a 50.00 pack-year smoking history. He has never used smokeless tobacco. He reports that he drinks alcohol. He reports that he does not use drugs.  ALLERGIES:  Allergies  Allergen Reactions  . Hydrocodone-Acetaminophen  Other (See Comments)    Makes pt. Feel loopy    ROS: Pertinent items noted in HPI and remainder of comprehensive ROS otherwise negative.  HOME MEDS: Current Outpatient Medications on [name] Prior to Visit  Medication Sig Dispense Refill  . amLODipine (NORVASC) 10 MG tablet Take 1 tablet (10 mg total) by mouth daily. 30 tablet 2  . aspirin 81 MG EC tablet Take 81 mg by mouth daily.      . carvedilol (COREG) 12.5 MG tablet Take 1 tablet (12.5 mg total) by mouth 2 (two) times daily with a meal. 60 tablet 2  . Choline Fenofibrate (FENOFIBRIC ACID) 135 MG CPDR TAKE (1) CAPSULE DAILY (Patient taking differently: Take 135 mg by mouth at bedtime. TAKE (1) CAPSULE DAILY) 90 capsule 1  . esomeprazole (NEXIUM) 40 MG capsule TAKE (1) CAPSULE DAILY (Patient taking differently: Take 40 mg by mouth daily at 12 noon. TAKE (1) CAPSULE DAILY) 90 capsule 1  . furosemide (LASIX) 20 MG tablet Take 0.5 tablets (10 mg total) by mouth daily. 30 tablet 3  . glimepiride (AMARYL) 4 MG tablet TAKE (1) TABLET TWICE A DAY. 60 tablet 2  . latanoprost (XALATAN) 0.005 % ophthalmic solution Place 1 drop into both eyes at bedtime.    Marland Kitchen MYRBETRIQ 50 MG TB24 tablet Take 50 mg by mouth daily.    . nitroGLYCERIN (NITROSTAT) 0.4 MG SL tablet Place 1 tablet (0.4 mg total) under the tongue every 5 (five) minutes as needed. 25 tablet 3  . Probiotic Product (PROBIOTIC DAILY PO) Take 1 application by mouth daily at 12 noon.    . rosuvastatin (CRESTOR) 10 MG tablet TAKE 1 TABLET DAILY (Patient taking differently: Take 10 mg by mouth at bedtime. ) 90 tablet 1  . sitaGLIPtin (JANUVIA) 25 MG tablet Take 1 tablet (25 mg total) by mouth daily. For diabetes 90 tablet 3  . tamsulosin (FLOMAX) 0.4 MG CAPS capsule Take 0.4 mg by mouth daily.      No current facility-administered medications on [name] prior to visit.     LABS/IMAGING: No results found for this or any previous visit (from the past 48 hour(s)). No results found.  LIPID PANEL:     Component Value Date/Time   CHOL 147 04/22/2018 0952   TRIG 510 (H) 04/22/2018 0952   TRIG 538 (H) 09/22/2016 0806   HDL 18 (L) 04/22/2018 0952   HDL 15 (L) 09/22/2016 0806   CHOLHDL 8.2 (H) 04/22/2018 0952   CHOLHDL 4.9 04/08/2013 0827   VLDL 55 (H) 04/08/2013 0827   LDLCALC Comment 04/22/2018 0952   LDLCALC  48 07/16/2014 0818   LDLDIRECT 60 02/17/2016 0805    WEIGHTS: Wt Readings from Last 3 Encounters:  10/09/18 196 lb (88.9 kg)  10/01/18 195 lb (88.5 kg)  09/24/18 194 lb 9.6 oz (88.3 kg)    VITALS: BP 138/62   Pulse 82   Ht 5' 7.5" (1.715 m)   Wt 196 lb (88.9 kg)   SpO2 96%   BMI 30.24 kg/m   EXAM: General appearance: alert and no distress Neck: no carotid bruit, no JVD and thyroid not enlarged, symmetric, no tenderness/mass/nodules Lungs: clear to auscultation bilaterally Heart: regular rate and rhythm Abdomen: soft, non-tender; bowel sounds normal; no masses,  no organomegaly Extremities: extremities normal, atraumatic, no cyanosis or edema Pulses: 2+ and symmetric Skin: Skin color, texture, turgor normal. No rashes or lesions Neurologic: Grossly normal Psych: Pleasant  EKG: Deferred  ASSESSMENT: 1. Coronary artery disease status post prior PCI 2. Mixed dyslipidemia (elevated LDL and triglycerides, low HDL) 3. Possible medication nonadherence  PLAN: 1.   Mr. Camerer has coronary artery disease with persistently elevated triglycerides.  Although he is prescribed appropriate therapies is not clear that he is consistently taking the medications.  In fact he may not be taking the Vascepa at all.  We will go ahead and re-prescribe this and provide samples for him today.  I have encouraged him to have a separate pill container which would allow him to be more consistent with his medications.  I suspect that this may actually improve his numbers significantly and lower his risk.  He should continue on fenofibrate and his statin.  I like to repeat a lipid profile now  along with a direct LDL.  If LDL remains above goal, then we would likely recommend intensifying his statin.  We potentially could be a candidate for PCSK9 inhibitor therapy as well.  Thanks again for the kind referral.  We will plan follow-up with him in about 3 to 4 months.  Pixie Casino, MD, [facility], [place] Director of the Advanced Lipid Disorders &  Cardiovascular Risk Reduction Clinic Diplomate of the American Board of Clinical Lipidology Attending Cardiologist  Direct Dial: [phone]  Fax: [phone]  Website:  [email or web address] 10/09/2018, 12:55 PM

## 2018-10-13 ENCOUNTER — Other Ambulatory Visit: Payer: Medicare HMO

## 2018-10-13 DIAGNOSIS — E785 Hyperlipidemia, unspecified: Secondary | ICD-10-CM | POA: Diagnosis not present

## 2018-10-13 DIAGNOSIS — E781 Pure hyperglyceridemia: Secondary | ICD-10-CM | POA: Diagnosis not present

## 2018-10-14 LAB — LIPID PANEL
Chol/HDL Ratio: 7.7 ratio — ABNORMAL HIGH (ref 0.0–5.0)
Cholesterol, Total: 147 mg/dL (ref 100–199)
HDL: 19 mg/dL — ABNORMAL LOW (ref >39)
Triglycerides: 520 mg/dL — ABNORMAL HIGH (ref 0–149)

## 2018-10-14 LAB — LDL CHOLESTEROL, DIRECT: LDL Direct: 56 mg/dL (ref 0–99)

## 2018-10-15 ENCOUNTER — Other Ambulatory Visit: Payer: Self-pay | Admitting: Pediatrics

## 2018-10-15 DIAGNOSIS — I10 Essential (primary) hypertension: Secondary | ICD-10-CM

## 2018-10-16 ENCOUNTER — Other Ambulatory Visit: Payer: Self-pay

## 2018-10-16 MED ORDER — TAMSULOSIN HCL 0.4 MG PO CAPS: 0.4000 mg | ORAL_CAPSULE | Freq: Every day | ORAL | 1 refills | Status: DC

## 2018-10-16 MED ORDER — AMLODIPINE BESYLATE 10 MG PO TABS: 10.0000 mg | ORAL_TABLET | Freq: Every day | ORAL | 5 refills | Status: DC

## 2018-10-22 ENCOUNTER — Ambulatory Visit: Payer: Medicare HMO | Admitting: Family Medicine

## 2018-10-22 ENCOUNTER — Encounter: Payer: Self-pay | Admitting: Family Medicine

## 2018-10-22 VITALS — BP 124/66 | HR 72 | Temp 97.4°F | Ht 67.5 in | Wt 193.0 lb

## 2018-10-22 DIAGNOSIS — N183 Chronic kidney disease, stage 3 unspecified: Secondary | ICD-10-CM

## 2018-10-22 DIAGNOSIS — I10 Essential (primary) hypertension: Secondary | ICD-10-CM | POA: Diagnosis not present

## 2018-10-22 DIAGNOSIS — E1122 Type 2 diabetes mellitus with diabetic chronic kidney disease: Secondary | ICD-10-CM

## 2018-10-22 DIAGNOSIS — I2510 Atherosclerotic heart disease of native coronary artery without angina pectoris: Secondary | ICD-10-CM

## 2018-10-22 DIAGNOSIS — E559 Vitamin D deficiency, unspecified: Secondary | ICD-10-CM

## 2018-10-22 NOTE — Progress Notes (Signed)
Subjective:    Patient ID: Timothy [name], male    DOB: [birth year], 77 y.o.   MRN: [id number]  HPI Pt here for follow up and management of chronic medical problems which includes diabetes, hypertension and CKD. He is taking medication regularly.  Patient is doing well today with no complaints.  She did he does not need any refills.  His vital signs are stable.  He is following up with the cardiologist regarding better control of his lipids and will not know of anything he needs to do about this until he is seen again in January.  He has no complaints.  He will be given an FOBT to return.  He has had lab work done and we will review that with him during the visit.  The blood sugar was elevated at 173.  The creatinine, remains elevated at 1.80 and previously was 1.90.  All of the electrolytes including potassium were good.  This lab work was done on 24 September.  The CBC had a normal white blood cell count and a hemoglobin that was slightly decreased at 11.9 and the platelets were adequate.  The direct LDL C was 56.  Cholesterol numbers with traditional lipid testing did have an HDL that was low at 19 and triglycerides are elevated at 520.  The patient today denies any chest pain pressure tightness or shortness of breath.  He denies any trouble with swallowing heartburn indigestion nausea vomiting diarrhea blood in the stool or black tarry bowel movements.  He does take generic Nexium and attributes this to the fact he is not having any heartburn.  He is passing his water without problems.  He is pleasant today and alert and is seeing the lipid specialist that works with Dr. Percival Spanish and has plans to follow-up on his cholesterol numbers and lipid numbers sometime in January.  We will definitely order a BMP and CBC on him in our office in the next month to follow-up on his renal function and hemoglobin.     Patient Active Problem List   Diagnosis Date Noted  . Mixed dyslipidemia 10/09/2018  . Enteritis  08/15/2018  . AKI (acute kidney injury) ([place]) 08/15/2018  . Thrombocytopenia ([place]) 09/23/2016  . Elevated triglycerides with high cholesterol 10/18/2015  . CKD (chronic kidney disease) stage 3, GFR 30-59 ml/min (HCC) 05/13/2015  . Glaucoma 12/27/2014  . Overweight(278.02) 06/27/2011  . ASCVD (arteriosclerotic cardiovascular disease)   . Essential hypertension, benign   . Hyperplasia of prostate   . 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 Type 2 diabetes mellitus with diabetic nephropathy ([place]) 05/24/2010  . Hyperlipidemia LDL goal <70 05/13/2009  . Coronary atherosclerosis 05/13/2009   Outpatient Encounter Medications as of 10/22/2018  Medication Sig  . amLODipine (NORVASC) 10 MG tablet Take 1 tablet (10 mg total) by mouth daily.  Marland Kitchen aspirin 81 MG EC tablet Take 81 mg by mouth daily.    . carvedilol (COREG) 12.5 MG tablet TAKE  (1)  TABLET TWICE A DAY WITH MEALS (BREAKFAST AND SUPPER)  . Choline Fenofibrate (FENOFIBRIC ACID) 135 MG CPDR TAKE (1) CAPSULE DAILY (Patient taking differently: Take 135 mg by mouth at bedtime. TAKE (1) CAPSULE DAILY)  . esomeprazole (NEXIUM) 40 MG capsule TAKE (1) CAPSULE DAILY (Patient taking differently: Take 40 mg by mouth daily at 12 noon. TAKE (1) CAPSULE DAILY)  . furosemide (LASIX) 20 MG tablet Take 0.5 tablets (10 mg total) by mouth daily.  Marland Kitchen glimepiride (AMARYL) 4 MG  tablet TAKE (1) TABLET TWICE A DAY.  Marland Kitchen Icosapent Ethyl (VASCEPA) 1 g CAPS Take 2 capsules (2 g total) by mouth 2 (two) times daily.  Marland Kitchen latanoprost (XALATAN) 0.005 % ophthalmic solution Place 1 drop into both eyes at bedtime.  Marland Kitchen MYRBETRIQ 50 MG TB24 tablet Take 50 mg by mouth daily.  . nitroGLYCERIN (NITROSTAT) 0.4 MG SL tablet Place 1 tablet (0.4 mg total) under the tongue every 5 (five) minutes as needed.  . Probiotic Product (PROBIOTIC DAILY PO) Take 1 application by mouth daily at 12 noon.  . rosuvastatin (CRESTOR) 10 MG tablet TAKE 1 TABLET DAILY (Patient taking  differently: Take 10 mg by mouth at bedtime. )  . sitaGLIPtin (JANUVIA) 25 MG tablet Take 1 tablet (25 mg total) by mouth daily. For diabetes  . tamsulosin (FLOMAX) 0.4 MG CAPS capsule Take 1 capsule (0.4 mg total) by mouth daily.   No facility-administered encounter medications on file as of 10/22/2018.      Review of Systems  Constitutional: Negative.   HENT: Negative.   Eyes: Negative.   Respiratory: Negative.   Cardiovascular: Negative.   Gastrointestinal: Negative.   Endocrine: Negative.   Genitourinary: Negative.   Musculoskeletal: Negative.   Skin: Negative.   Allergic/Immunologic: Negative.   Neurological: Negative.   Hematological: Negative.   Psychiatric/Behavioral: Negative.        Objective:   Physical Exam  Constitutional: He is oriented to person, place, and time. He appears well-developed and well-nourished. No distress.  The patient is pleasant and alert.  He has no complaints today he is still recovering from the admission to the hospital for the prolonged diarrhea and vomiting episode he had but his lab numbers are improving slowly.  HENT:  Head: Normocephalic and atraumatic.  Right Ear: External ear normal.  Left Ear: External ear normal.  Nose: Nose normal.  Mouth/Throat: Oropharynx is clear and moist. No oropharyngeal exudate.  Eyes: Pupils are equal, round, and reactive to light. Conjunctivae and EOM are normal. Right eye exhibits no discharge. Left eye exhibits no discharge. No scleral icterus.  Eye exam coming up in January.  Neck: Normal range of motion. Neck supple. No thyromegaly present.  No bruits thyromegaly or anterior cervical adenopathy  Cardiovascular: Normal rate, regular rhythm, normal heart sounds and intact distal pulses.  No murmur heard. The heart is irregular at 72/min with good pedal pulses and no edema  Pulmonary/Chest: Effort normal and breath sounds normal. He has no wheezes. He has no rales.  Clear anteriorly and posteriorly and  no axillary adenopathy or chest wall masses  Abdominal: Soft. Bowel sounds are normal. He exhibits no distension and no mass. There is tenderness.  Minimal epigastric tenderness without liver or spleen enlargement bruits or inguinal adenopathy  Musculoskeletal: Normal range of motion. He exhibits no edema.  Lymphadenopathy:    He has no cervical adenopathy.  Neurological: He is alert and oriented to person, place, and time. He has normal reflexes. No cranial nerve deficit.  Skin: Skin is warm and dry. No rash noted.  Psychiatric: He has a normal mood and affect. His behavior is normal. Judgment and thought content normal.  Patient's mood affect and behavior are all positive.  Nursing note and vitals reviewed.   Blood pressure 124/66, pulse 72, temperature (!) 97.4 F (36.3 C), temperature source Oral, height 5' 7.5" (1.715 m), weight 193 lb (87.5 kg).        Assessment & Plan:  1. Essential hypertension -Blood pressure is good today  and he will continue with current treatment  2. Type 2 diabetes mellitus with stage 3 chronic kidney disease, without long-term current use of insulin (HCC) -Continue to monitor blood sugars closely eat healthy and lose weight  3. CKD (chronic kidney disease) stage 3, GFR 30-59 ml/min (HCC) -Recheck BMP in 4 weeks and keep blood pressure under the best control possible and watch sodium intake  4. ASCVD (arteriosclerotic cardiovascular disease) -Follow-up with Dr. Percival Spanish and Dr. [name] as planned and repeat lipid studies in January  5. Vitamin D deficiency -Continue with vitamin D replacement  Patient Instructions                       Medicare Annual Wellness Visit  [facility] and the medical providers at [facility] strive to bring you the best medical care.  In doing so we not only want to address your current medical conditions and concerns but also to detect new conditions early and prevent illness, disease and  health-related problems.    Medicare offers a yearly Wellness Visit which allows our clinical staff to assess your need for preventative services including immunizations, lifestyle education, counseling to decrease risk of preventable diseases and screening for fall risk and other medical concerns.    This visit is provided free of charge (no copay) for all Medicare recipients. The clinical pharmacists at [facility] have begun to conduct these Wellness Visits which will also include a thorough review of all your medications.    As you primary medical provider recommend that you make an appointment for your Annual Wellness Visit if you have not done so already this year.  You may set up this appointment before you leave today or you may call back ([phone]) and schedule an appointment.  Please make sure when you call that you mention that you are scheduling your Annual Wellness Visit with the clinical pharmacist so that the appointment may be made for the proper length of time.    Continue current medications. Continue good therapeutic lifestyle changes which include good diet and exercise. Fall precautions discussed with patient. If an FOBT was given today- please return it to our front desk. If you are over 19 years old - you may need Prevnar 88 or the adult Pneumonia vaccine.  **Flu shots are available--- please call and schedule a FLU-CLINIC appointment**  After your visit with [place] today you will receive a survey in the mail or online from [employer] regarding your care with [place]. Please take a moment to fill this out. Your feedback is very important to [place] as you can help [place] better understand your patient needs as well as improve your experience and satisfaction. WE CARE ABOUT YOU!!!   With Dr. [name] as planned Follow-up with Dr. Percival Spanish as planned Pursue as aggressive therapeutic lifestyle changes as possible including diet and exercise to achieve weight loss.  Arrie Senate MD

## 2018-10-22 NOTE — Patient Instructions (Addendum)
Medicare Annual Wellness Visit  [facility] and the medical providers at [facility] strive to bring you the best medical care.  In doing so we not only want to address your current medical conditions and concerns but also to detect new conditions early and prevent illness, disease and health-related problems.    Medicare offers a yearly Wellness Visit which allows our clinical staff to assess your need for preventative services including immunizations, lifestyle education, counseling to decrease risk of preventable diseases and screening for fall risk and other medical concerns.    This visit is provided free of charge (no copay) for all Medicare recipients. The clinical pharmacists at [facility] have begun to conduct these Wellness Visits which will also include a thorough review of all your medications.    As you primary medical provider recommend that you make an appointment for your Annual Wellness Visit if you have not done so already this year.  You may set up this appointment before you leave today or you may call back ([phone]) and schedule an appointment.  Please make sure when you call that you mention that you are scheduling your Annual Wellness Visit with the clinical pharmacist so that the appointment may be made for the proper length of time.    Continue current medications. Continue good therapeutic lifestyle changes which include good diet and exercise. Fall precautions discussed with patient. If an FOBT was given today- please return it to our front desk. If you are over 70 years old - you may need Prevnar 22 or the adult Pneumonia vaccine.  **Flu shots are available--- please call and schedule a FLU-CLINIC appointment**  After your visit with [place] today you will receive a survey in the mail or online from [employer] regarding your care with [place]. Please take a moment to fill this out. Your feedback is very  important to [place] as you can help [place] better understand your patient needs as well as improve your experience and satisfaction. WE CARE ABOUT YOU!!!   With Dr. [name] as planned Follow-up with Dr. Percival Spanish as planned Pursue as aggressive therapeutic lifestyle changes as possible including diet and exercise to achieve weight loss.

## 2018-11-12 ENCOUNTER — Other Ambulatory Visit: Payer: Self-pay | Admitting: Family Medicine

## 2018-11-21 ENCOUNTER — Other Ambulatory Visit: Payer: Self-pay | Admitting: *Deleted

## 2018-11-21 DIAGNOSIS — I10 Essential (primary) hypertension: Secondary | ICD-10-CM

## 2018-11-21 MED ORDER — CARVEDILOL 12.5 MG PO TABS: ORAL_TABLET | ORAL | 1 refills | Status: DC

## 2018-12-06 ENCOUNTER — Other Ambulatory Visit: Payer: Self-pay | Admitting: Family Medicine

## 2019-01-08 ENCOUNTER — Ambulatory Visit: Payer: Medicare HMO | Admitting: Internal Medicine

## 2019-01-08 ENCOUNTER — Encounter (INDEPENDENT_AMBULATORY_CARE_PROVIDER_SITE_OTHER): Payer: Self-pay

## 2019-01-08 ENCOUNTER — Encounter: Payer: Self-pay | Admitting: Internal Medicine

## 2019-01-08 VITALS — BP 130/68 | HR 81 | Ht 67.5 in | Wt 197.0 lb

## 2019-01-08 DIAGNOSIS — E782 Mixed hyperlipidemia: Secondary | ICD-10-CM

## 2019-01-08 DIAGNOSIS — E781 Pure hyperglyceridemia: Secondary | ICD-10-CM

## 2019-01-08 DIAGNOSIS — I2510 Atherosclerotic heart disease of native coronary artery without angina pectoris: Secondary | ICD-10-CM

## 2019-01-08 NOTE — Patient Instructions (Signed)
Medication Instructions:  Continue current medications Any changes necessary will be made based on lab results If you need a refill on your cardiac medications before your next appointment, please call your pharmacy.   Lab work: FASTING lab work to check cholesterol as soon as possible Nothing to eat/drink after midnight  If you have labs (blood work) drawn today and your tests are completely normal, you will receive your results only by: Marland Kitchen MyChart Message (if you have MyChart) OR . A paper copy in the mail If you have any lab test that is abnormal or we need to change your treatment, we will call you to review the results.  Testing/Procedures: NONE  Follow-Up: At [facility], you and your health needs are our priority.  As part of our continuing mission to provide you with exceptional heart care, we have created designated Provider Care Teams.  These Care Teams include your primary Cardiologist (physician) and Advanced Practice Providers (APPs -  Physician Assistants and Nurse Practitioners) who all work together to provide you with the care you need, when you need it. . You will need a follow up appointment in 6 months with Timothy [name] ([place])  Any Other Special Instructions Will Be Listed Below (If Applicable).

## 2019-01-08 NOTE — Progress Notes (Signed)
OFFICE CONSULT NOTE  Chief Complaint: Follow-up dyslipidemia  Primary Care Physician: Chipper Herb, MD  HPI:  Timothy [name] is a 78 y.o. male who is being seen today for the evaluation of dyslipidemia at the request of [name], MD. This is a pleasant 78 year old male patient of Dr. Percival Spanish who sees Dr. Lynnette Caffey his primary care provider.  He is referred for evaluation and management of dyslipidemia.  He has a strong history of coronary artery disease with multiple heart catheterizations and prior coronary intervention, dating back to 25.  He also has chronic kidney disease, hypertension and type 2 diabetes.  He had significant dyslipidemia with primary elevated LDL, triglycerides and low HDL less than 20.  This is a typical diabetic or metabolic dyslipidemia.  Recent labs were 5 months ago indicating a total cholesterol 147, triglycerides 510, HDL 18 and LDL was not calculated.  He also had a lipoprotein NMR 2 years ago which showed LDL particle number of 1229 and triglycerides again over 500 with a high small LDL particle number of 1086.  Mr. Nephew has a history of intolerance to atorvastatin which caused hot flashes.  He is currently on rosuvastatin 10 mg but has not tried higher doses.  He also is written to take fenofibrate 135 mg daily and Vascepa 2 g twice daily.  After much discussion it seems that he is not regularly taking the Vascepa.  He said initially was that they are larger capsules and did not fit in his pillbox therefore he has a separate container but often forgets to take the medication.  Review of filled prescriptions show that he did not get a scription filled for Vascepa since 2017.  We do know that he was getting some samples from his primary care provider however the suggest that compliance with the medication is not where it needs to be.  He does report somewhat of an atherogenic diet and denies any routine alcohol use.  With regards to A1c, this was most recently  6.8, from 6.110 months ago.  01/08/2019  Mr. Picou is seen today in follow-up.  He reports compliance with Vascepa 2 g twice daily.  He says he may have only missed about 5 doses over the past several months.  I suspect this will make a big difference in his numbers however unfortunately he did not have his fasting lipid profile obtained.  Also he is nonfasting today.  This could significantly affect his lipid profile.  We will need to repeat the lipids as previously recommended.  Overall he is tolerating medication.  PMHx:  Past Medical History:  Diagnosis Date  . Cataract   . Chronic kidney disease   . Colon polyp   . Coronary artery disease    (left main normal, LAD 25-30% stenosis, distal 30-40% stenosis, circumflex obtuse marginal 50% stenosis,  right coronary artery dominant with long 75% followed by mid 80%  stenosis followed by 9% stenosis at the ostium of the PDA.  He had  stenting of the PDA by Dr. [name].  This was a Cypher stent.   First angioplasty was 1994, Cath 2011 100% RCA ).      . Diabetes mellitus without complication ([place])   . Essential hypertension, benign   . Glaucoma   . Hyperlipidemia   . Hyperplasia of prostate   . 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 Melanoma ([place])    melanoma both sides face, right ear  . Myocardial infarction ([place])  per pt/ had mild heart attacks/has 3 stents  . OSA (obstructive sleep apnea) 08/17/2016  . Sleep apnea    no c-pap    Past Surgical History:  Procedure Laterality Date  . APPENDECTOMY    . CERVICAL SPINE SURGERY    . CHOLECYSTECTOMY    . COLONOSCOPY  2004   multiple since  . CORONARY ANGIOPLASTY WITH STENT PLACEMENT     3 sents  . EYE SURGERY Bilateral    cataracts  . HERNIA REPAIR Bilateral   . SKIN CANCER EXCISION  03/17/2017    FAMHx:  Family History  Problem Relation Age of Onset  . Cancer Mother        unsure of type  . Glaucoma Mother   . Heart disease Father   . Coronary  artery disease Father   . Cancer Sister 48       granlocytosis (Wergerner's)  . Colon cancer Brother 71  . Nephritis Sister        20's  . Cancer Brother        lung     SOCHx:   reports that he quit smoking about 27 years ago. His smoking use included cigarettes. He has a 50.00 pack-year smoking history. He has never used smokeless tobacco. He reports current alcohol use. He reports that he does not use drugs.  ALLERGIES:  Allergies  Allergen Reactions  . Hydrocodone-Acetaminophen Other (See Comments)    Makes pt. Feel loopy    ROS: Pertinent items noted in HPI and remainder of comprehensive ROS otherwise negative.  HOME MEDS: Current Outpatient Medications on File Prior to Visit  Medication Sig Dispense Refill  . amLODipine (NORVASC) 10 MG tablet Take 1 tablet (10 mg total) by mouth daily. 30 tablet 5  . aspirin 81 MG EC tablet Take 81 mg by mouth daily.      . carvedilol (COREG) 12.5 MG tablet TAKE  (1)  TABLET TWICE A DAY WITH MEALS (BREAKFAST AND SUPPER) 180 tablet 1  . Choline Fenofibrate (FENOFIBRIC ACID) 135 MG CPDR TAKE (1) CAPSULE DAILY 90 capsule 1  . esomeprazole (NEXIUM) 40 MG capsule TAKE (1) CAPSULE DAILY 90 capsule 1  . furosemide (LASIX) 20 MG tablet Take 0.5 tablets (10 mg total) by mouth daily. 30 tablet 3  . glimepiride (AMARYL) 4 MG tablet TAKE (1) TABLET TWICE A DAY. 180 tablet 0  . Icosapent Ethyl (VASCEPA) 1 g CAPS Take 2 capsules (2 g total) by mouth 2 (two) times daily. 120 capsule 11  . latanoprost (XALATAN) 0.005 % ophthalmic solution Place 1 drop into both eyes at bedtime.    Marland Kitchen MYRBETRIQ 50 MG TB24 tablet Take 50 mg by mouth daily.    . nitroGLYCERIN (NITROSTAT) 0.4 MG SL tablet Place 1 tablet (0.4 mg total) under the tongue every 5 (five) minutes as needed. 25 tablet 3  . Probiotic Product (PROBIOTIC DAILY PO) Take 1 application by mouth daily at 12 noon.    . rosuvastatin (CRESTOR) 10 MG tablet TAKE 1 TABLET DAILY 90 tablet 1  . sitaGLIPtin  (JANUVIA) 25 MG tablet Take 1 tablet (25 mg total) by mouth daily. For diabetes 90 tablet 3  . tamsulosin (FLOMAX) 0.4 MG CAPS capsule Take 1 capsule (0.4 mg total) by mouth daily. 90 capsule 1   No current facility-administered medications on file prior to visit.     LABS/IMAGING: No results found for this or any previous visit (from the past 48 hour(s)). No results found.  LIPID PANEL:  Component Value Date/Time   CHOL 147 10/13/2018 0820   TRIG 520 (H) 10/13/2018 0820   TRIG 538 (H) 09/22/2016 0806   HDL 19 (L) 10/13/2018 0820   HDL 15 (L) 09/22/2016 0806   CHOLHDL 7.7 (H) 10/13/2018 0820   CHOLHDL 4.9 04/08/2013 0827   VLDL 55 (H) 04/08/2013 0827   LDLCALC Comment 10/13/2018 0820   LDLCALC 48 07/16/2014 0818   LDLDIRECT 56 10/13/2018 0820    WEIGHTS: Wt Readings from Last 3 Encounters:  01/08/19 197 lb (89.4 kg)  10/22/18 193 lb (87.5 kg)  10/09/18 196 lb (88.9 kg)    VITALS: BP 130/68   Pulse 81   Ht 5' 7.5" (1.715 m)   Wt 197 lb (89.4 kg)   BMI 30.40 kg/m   EXAM: Deferred  EKG: Deferred  ASSESSMENT: 1. Coronary artery disease status post prior PCI 2. Mixed dyslipidemia (elevated LDL and triglycerides, low HDL) 3. Possible medication nonadherence  PLAN: 1.   Mr. Macera is on therapy for primary hypertriglyceridemia.  He reported compliance with his Vascepa.  I am interested in whether we have seen any significant improvement in his numbers.  Unfortunately he did not have a repeat lipid profile prior to this visit and is not fasting today.  We will give him a lab slip for blood work to be done fasting later this week.  Follow-up with me in 6 months or sooner as necessary and I will adjust medications accordingly.  Pixie Casino, MD, [facility], [place] Director of the [facility]  Cardiovascular [facility] Diplomate of the American Board of Clinical Lipidology Attending Cardiologist  Direct  Dial: [phone]  Fax: [phone]  Website:  [email or web address] Ilisa Hayworth 01/08/2019, 8:18 AM

## 2019-01-09 ENCOUNTER — Other Ambulatory Visit: Payer: Medicare HMO

## 2019-01-09 DIAGNOSIS — E781 Pure hyperglyceridemia: Secondary | ICD-10-CM | POA: Diagnosis not present

## 2019-01-09 DIAGNOSIS — E785 Hyperlipidemia, unspecified: Secondary | ICD-10-CM | POA: Diagnosis not present

## 2019-01-10 LAB — LDL CHOLESTEROL, DIRECT: LDL Direct: 73 mg/dL (ref 0–99)

## 2019-01-10 LAB — LIPID PANEL
Chol/HDL Ratio: 6.1 ratio — ABNORMAL HIGH (ref 0.0–5.0)
Cholesterol, Total: 147 mg/dL (ref 100–199)
HDL: 24 mg/dL — ABNORMAL LOW (ref >39)
LDL Calculated: 54 mg/dL (ref 0–99)
Triglycerides: 344 mg/dL — ABNORMAL HIGH (ref 0–149)
VLDL Cholesterol Cal: 69 mg/dL — ABNORMAL HIGH (ref 5–40)

## 2019-01-13 ENCOUNTER — Other Ambulatory Visit: Payer: Self-pay | Admitting: Family Medicine

## 2019-01-13 ENCOUNTER — Encounter: Payer: Self-pay | Admitting: Internal Medicine

## 2019-03-05 ENCOUNTER — Other Ambulatory Visit: Payer: Self-pay | Admitting: Family Medicine

## 2019-03-10 ENCOUNTER — Ambulatory Visit: Payer: Medicare HMO | Admitting: Family Medicine

## 2019-04-10 ENCOUNTER — Telehealth: Payer: Self-pay | Admitting: *Deleted

## 2019-04-10 NOTE — Telephone Encounter (Signed)
YOUR CARDIOLOGY TEAM HAS ARRANGED FOR AN E-VISIT FOR YOUR APPOINTMENT - PLEASE REVIEW IMPORTANT INFORMATION BELOW SEVERAL DAYS PRIOR TO YOUR APPOINTMENT  Due to the recent COVID-19 pandemic, we are transitioning in-person office visits to tele-medicine visits in an effort to decrease unnecessary exposure to our patients, their families, and staff. These visits are billed to your insurance just like a normal visit is. We also encourage you to sign up for MyChart if you have not already done so. You will need a smartphone if possible. For patients that do not have this, we can still complete the visit using a regular telephone but do prefer a smartphone to enable video when possible. You may have a family member that lives with you that can help. If possible, we also ask that you have a blood pressure cuff and scale at home to measure your blood pressure, heart rate and weight prior to your scheduled appointment. Patients with clinical needs that need an in-person evaluation and testing will still be able to come to the office if absolutely necessary. If you have any questions, feel free to call our office.   YOUR PROVIDER WILL BE USING THE FOLLOWING PLATFORM TO COMPLETE YOUR VISIT:   [email or web address]  2-3 DAYS BEFORE YOUR APPOINTMENT  You will receive a telephone call from one of our [facility] team members - your caller ID may say "Unknown caller." If this is a video visit, we will walk you through how to get the video launched on your phone. We will remind you check your blood pressure, heart rate and weight prior to your scheduled appointment. If you have an Apple Watch or Kardia, please upload any pertinent ECG strips the day before or morning of your appointment to [facility]. Our staff will also make sure you have reviewed the consent and agree to move forward with your scheduled tele-health visit.   THE DAY OF YOUR APPOINTMENT  Approximately 15 minutes prior to your scheduled appointment, you will receive a  telephone call from one of [facility] team - your caller ID may say "Unknown caller."  Our staff will confirm medications, vital signs for the day and any symptoms you may be experiencing. Please have this information available prior to the time of visit start. It may also be helpful for you to have a pad of paper and pen handy for any instructions given during your visit. They will also walk you through joining the smartphone meeting if this is a video visit.    CONSENT FOR TELE-HEALTH VISIT - PLEASE REVIEW  I hereby voluntarily request, consent and authorize [facility] and its employed or contracted physicians, physician assistants, nurse practitioners or other licensed health care professionals (the Practitioner), to provide me with telemedicine health care services (the "Services") as deemed necessary by the treating Practitioner. I acknowledge and consent to receive the Services by the Practitioner via telemedicine. I understand that the telemedicine visit will involve communicating with the Practitioner through live audiovisual communication technology and the disclosure of certain medical information by electronic transmission. I acknowledge that I have been given the opportunity to request an in-person assessment or other available alternative prior to the telemedicine visit and am voluntarily participating in the telemedicine visit.  I understand that I have the right to withhold or withdraw my consent to the use of telemedicine in the course of my care at any time, without affecting my right to future care or treatment, and that the Practitioner or I may terminate the telemedicine visit at  any time. I understand that I have the right to inspect all information obtained and/or recorded in the course of the telemedicine visit and may receive copies of available information for a reasonable fee.  I understand that some of the potential risks of receiving the Services via telemedicine include:   Marland Kitchen Delay or interruption in medical evaluation due to technological equipment failure or disruption; . Information transmitted may not be sufficient (e.g. poor resolution of images) to allow for appropriate medical decision making by the Practitioner; and/or  . In rare instances, security protocols could fail, causing a breach of personal health information.  Furthermore, I acknowledge that it is my responsibility to provide information about my medical history, conditions and care that is complete and accurate to the best of my ability. I acknowledge that Practitioner's advice, recommendations, and/or decision may be based on factors not within their control, such as incomplete or inaccurate data provided by me or distortions of diagnostic images or specimens that may result from electronic transmissions. I understand that the practice of medicine is not an exact science and that Practitioner makes no warranties or guarantees regarding treatment outcomes. I acknowledge that I will receive a copy of this consent concurrently upon execution via email to the email address I last provided but may also request a printed copy by calling the office of [facility].    I understand that my insurance will be billed for this visit.   I have read or had this consent read to me. . I understand the contents of this consent, which adequately explains the benefits and risks of the Services being provided via telemedicine.  . I have been provided ample opportunity to ask questions regarding this consent and the Services and have had my questions answered to my satisfaction. . I give my informed consent for the services to be provided through the use of telemedicine in my medical care  By participating in this telemedicine visit I agree to the above.  Verbal consent obtained for upcoming scheduled virtual appointment with Dr Percival Spanish.

## 2019-04-14 NOTE — Progress Notes (Signed)
Virtual Visit via Video Note   This visit type was conducted due to national recommendations for restrictions regarding the COVID-19 Pandemic (e.g. social distancing) in an effort to limit this patient's exposure and mitigate transmission in our community.  Due to his co-morbid illnesses, this patient is at least at moderate risk for complications without adequate follow up.  This format is felt to be most appropriate for this patient at this time.  All issues noted in this document were discussed and addressed.  A limited physical exam was performed with this format.  Please refer to the patient's chart for his consent to telehealth for [facility].   Evaluation Performed:  Follow-up visit  Date:  04/15/2019   ID:  Timothy [name], DOB 02/13/1941, MRN [id number]  Patient Location: Home Provider Location: Home  PCP:  Chipper Herb, MD  Cardiologist:  Minus Breeding, MD Electrophysiologist:  None   Chief Complaint:  CAD  History of Present Illness:    Timothy [name] is a 78 y.o. male who presents for follow up of CAD.  Since I last saw him he has done well.  The patient denies any new symptoms such as chest discomfort, neck or arm discomfort. There has been no new shortness of breath, PND or orthopnea. There have been no reported palpitations, presyncope or syncope.   The patient does not have symptoms concerning for COVID-19 infection (fever, chills, cough, or new shortness of breath).    Past Medical History:  Diagnosis Date  . Cataract   . Chronic kidney disease   . Colon polyp   . Coronary artery disease    (left main normal, LAD 25-30% stenosis, distal 30-40% stenosis, circumflex obtuse marginal 50% stenosis,  right coronary artery dominant with long 75% followed by mid 80%  stenosis followed by 9% stenosis at the ostium of the PDA.  He had  stenting of the PDA by Dr. [name].  This was a Cypher stent.   First angioplasty was 1994, Cath 2011 100% RCA ).      . Diabetes  mellitus without complication ([place])   . Essential hypertension, benign   . Glaucoma   . Hyperlipidemia   . Hyperplasia of prostate   . 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 Melanoma ([place])    melanoma both sides face, right ear  . Myocardial infarction ([place])    per pt/ had mild heart attacks/has 3 stents  . OSA (obstructive sleep apnea) 08/17/2016  . Sleep apnea    no c-pap   Past Surgical History:  Procedure Laterality Date  . APPENDECTOMY    . CERVICAL SPINE SURGERY    . CHOLECYSTECTOMY    . COLONOSCOPY  2004   multiple since  . CORONARY ANGIOPLASTY WITH STENT PLACEMENT     3 sents  . EYE SURGERY Bilateral    cataracts  . HERNIA REPAIR Bilateral   . SKIN CANCER EXCISION  03/17/2017     Prior to Admission medications   Medication Sig Start Date End Date Taking? Authorizing Provider  amLODipine (NORVASC) 10 MG tablet Take 1 tablet (10 mg total) by mouth daily. 10/16/18 10/16/19 Yes Chipper Herb, MD  aspirin 81 MG EC tablet Take 81 mg by mouth daily.     Yes [provider]  carvedilol (COREG) 12.5 MG tablet TAKE  (1)  TABLET TWICE A DAY WITH MEALS (BREAKFAST AND SUPPER) 11/21/18  Yes Chipper Herb, MD  Choline Fenofibrate (FENOFIBRIC ACID) 135 MG  CPDR TAKE (1) CAPSULE DAILY 11/14/18  Yes Chipper Herb, MD  esomeprazole (NEXIUM) 40 MG capsule TAKE (1) CAPSULE DAILY 11/14/18  Yes Chipper Herb, MD  furosemide (LASIX) 20 MG tablet Take 0.5 tablets (10 mg total) by mouth daily. 09/09/18  Yes Chipper Herb, MD  glimepiride (AMARYL) 4 MG tablet TAKE (1) TABLET TWICE A DAY. 03/06/19  Yes Chipper Herb, MD  Icosapent Ethyl (VASCEPA) 1 g CAPS Take 2 capsules (2 g total) by mouth 2 (two) times daily. 10/09/18  Yes Hilty, Nadean Corwin, MD  latanoprost (XALATAN) 0.005 % ophthalmic solution Place 1 drop into both eyes at bedtime.   Yes [provider]  nitroGLYCERIN (NITROSTAT) 0.4 MG SL tablet Place 1 tablet (0.4 mg total) under the tongue  every 5 (five) minutes as needed. 07/26/14  Yes Chipper Herb, MD  rosuvastatin (CRESTOR) 10 MG tablet TAKE 1 TABLET DAILY 11/14/18  Yes Chipper Herb, MD  sitaGLIPtin (JANUVIA) 25 MG tablet Take 1 tablet (25 mg total) by mouth daily. For diabetes 09/15/18  Yes Chipper Herb, MD  tamsulosin [facility]) 0.4 MG CAPS capsule TAKE (1) CAPSULE DAILY 01/14/19  Yes Chipper Herb, MD  MYRBETRIQ 50 MG TB24 tablet Take 50 mg by mouth daily. 08/28/17   [provider]  Probiotic Product (PROBIOTIC DAILY PO) Take 1 application by mouth daily at 12 noon.    [provider]     Allergies:   Hydrocodone-acetaminophen   Social History   Tobacco Use  . Smoking status: Former Smoker    Packs/day: 2.00    Years: 25.00    Pack years: 50.00    Types: Cigarettes    Last attempt to quit: 08/14/1991    Years since quitting: 27.6  . Smokeless tobacco: Never Used  Substance Use Topics  . Alcohol use: Yes    Comment: rare  . Drug use: No     Family Hx: The patient's family history includes Cancer in his brother and mother; Cancer (age of onset: 70) in his sister; Colon cancer (age of onset: 38) in his brother; Coronary artery disease in his father; Glaucoma in his mother; Heart disease in his father; Nephritis in his sister.  ROS:   Please see the history of present illness.    As stated in the HPI and negative for all other systems.   Prior CV studies:   The following studies were reviewed today:  Labs  Labs/Other Tests and Data Reviewed:    EKG:  No ECG reviewed.  Recent Labs: 04/22/2018: TSH 4.160 08/25/2018: ALT 20 09/09/2018: Hemoglobin 11.9; Platelets 165 09/24/2018: BUN 17; Creatinine, Ser 2.06; Potassium 4.0; Sodium 139   Recent Lipid Panel Lab Results  Component Value Date/Time   CHOL 147 01/09/2019 10:21 AM   TRIG 344 (H) 01/09/2019 10:21 AM   TRIG 538 (H) 09/22/2016 08:06 AM   HDL 24 (L) 01/09/2019 10:21 AM   HDL 15 (L) 09/22/2016 08:06 AM   CHOLHDL 6.1 (H)  01/09/2019 10:21 AM   CHOLHDL 4.9 04/08/2013 08:27 AM   LDLCALC 54 01/09/2019 10:21 AM   LDLCALC 48 07/16/2014 08:18 AM   LDLDIRECT 73 01/09/2019 10:21 AM    Wt Readings from Last 3 Encounters:  04/15/19 188 lb (85.3 kg)  01/08/19 197 lb (89.4 kg)  10/22/18 193 lb (87.5 kg)     Objective:    Vital Signs:  BP (!) 162/70 (BP Location: Left Arm, Patient Position: Sitting, Cuff Size: Normal)   Pulse  84   Ht 5' 7.5" (1.715 m)   Wt 188 lb (85.3 kg)   BMI 29.01 kg/m    VITAL SIGNS:  reviewed GEN:  no acute distress EYES:  sclerae anicteric, EOMI - Extraocular Movements Intact NEURO:  alert and oriented x 3, no obvious focal deficit PSYCH:  normal affect  ASSESSMENT & PLAN:    CAD -  The patient has no new sypmtoms.  No further cardiovascular testing is indicated.  We will continue with aggressive risk reduction and meds as listed.  Essential hypertension, benign -  The blood pressure is elevated today.  This is very unusual.  He is going to keep a 2-week blood pressure diary.  He might need adjustment in his blood pressure medicines based on this but he tells me that typically his blood pressures in the 130s.  DYSLIPIDEMIA -  LDL was 73 in Jan.  He will continue meds as listed.    SLEEP APNEA - He could not afford CPAP.    CKD STAGE III - I will have him come back for BMET and CBC in early July.   DM - This is followed by Chipper Herb, MD  A1C was 6.8.  Continue current therapy.  COVID-19 Education: The signs and symptoms of COVID-19 were discussed with the patient and how to seek care for testing (follow up with PCP or arrange E-visit).   The importance of social distancing was discussed today.  Time:   Today, I have spent 15 minutes with the patient with telehealth technology discussing the above problems.     Medication Adjustments/Labs and Tests Ordered: Current medicines are reviewed at length with the patient today.  Concerns regarding medicines are  outlined above.   Tests Ordered: No orders of the defined types were placed in this encounter.   Medication Changes: No orders of the defined types were placed in this encounter.   Disposition:  Follow up one year  Signed, Minus Breeding, MD  04/15/2019 12:08 PM    [place]

## 2019-04-15 ENCOUNTER — Telehealth (INDEPENDENT_AMBULATORY_CARE_PROVIDER_SITE_OTHER): Payer: Medicare HMO | Admitting: Cardiology

## 2019-04-15 ENCOUNTER — Encounter: Payer: Self-pay | Admitting: Cardiology

## 2019-04-15 VITALS — BP 162/70 | HR 84 | Ht 67.5 in | Wt 188.0 lb

## 2019-04-15 DIAGNOSIS — I10 Essential (primary) hypertension: Secondary | ICD-10-CM

## 2019-04-15 DIAGNOSIS — Z79899 Other long term (current) drug therapy: Secondary | ICD-10-CM | POA: Insufficient documentation

## 2019-04-15 DIAGNOSIS — N184 Chronic kidney disease, stage 4 (severe): Secondary | ICD-10-CM

## 2019-04-15 DIAGNOSIS — Z7189 Other specified counseling: Secondary | ICD-10-CM

## 2019-04-15 DIAGNOSIS — I2510 Atherosclerotic heart disease of native coronary artery without angina pectoris: Secondary | ICD-10-CM | POA: Insufficient documentation

## 2019-04-15 NOTE — Patient Instructions (Addendum)
Medication Instructions:  The current medical regimen is effective;  continue present plan and medications.  If you need a refill on your cardiac medications before your next appointment, please call your pharmacy.   Lab work: Please have blood work in 2 months at [facility] (CBC.BMP) DUE AROUND 06/15/2019.  If you have labs (blood work) drawn today and your tests are completely normal, you will receive your results only by: Marland Kitchen MyChart Message (if you have MyChart) OR . A paper copy in the mail If you have any lab test that is abnormal or we need to change your treatment, we will call you to review the results.  Follow-Up: Follow up in 1 year with Dr. Percival Spanish in [place].  You will receive a letter in the mail 2 months before you are due.  Please call us when you receive this letter to schedule your follow up appointment.  Thank you for choosing [facility]!!

## 2019-05-05 ENCOUNTER — Other Ambulatory Visit: Payer: Self-pay | Admitting: Family Medicine

## 2019-05-25 ENCOUNTER — Telehealth: Payer: Self-pay | Admitting: Family Medicine

## 2019-05-25 NOTE — Telephone Encounter (Signed)
Pt is wnting to speak to you about getting in to see Dr Laurance Flatten and do labs

## 2019-05-26 ENCOUNTER — Other Ambulatory Visit: Payer: Self-pay

## 2019-05-26 ENCOUNTER — Encounter (INDEPENDENT_AMBULATORY_CARE_PROVIDER_SITE_OTHER): Payer: Self-pay

## 2019-05-26 ENCOUNTER — Ambulatory Visit: Payer: Medicare HMO | Admitting: Family Medicine

## 2019-05-26 ENCOUNTER — Encounter: Payer: Self-pay | Admitting: Family Medicine

## 2019-05-26 VITALS — BP 132/71 | HR 77 | Temp 97.7°F | Ht 67.0 in | Wt 186.0 lb

## 2019-05-26 DIAGNOSIS — I10 Essential (primary) hypertension: Secondary | ICD-10-CM | POA: Diagnosis not present

## 2019-05-26 DIAGNOSIS — E1122 Type 2 diabetes mellitus with diabetic chronic kidney disease: Secondary | ICD-10-CM | POA: Diagnosis not present

## 2019-05-26 DIAGNOSIS — N400 Enlarged prostate without lower urinary tract symptoms: Secondary | ICD-10-CM

## 2019-05-26 DIAGNOSIS — N183 Chronic kidney disease, stage 3 unspecified: Secondary | ICD-10-CM

## 2019-05-26 DIAGNOSIS — E1159 Type 2 diabetes mellitus with other circulatory complications: Secondary | ICD-10-CM | POA: Diagnosis not present

## 2019-05-26 DIAGNOSIS — E785 Hyperlipidemia, unspecified: Secondary | ICD-10-CM

## 2019-05-26 DIAGNOSIS — E1121 Type 2 diabetes mellitus with diabetic nephropathy: Secondary | ICD-10-CM

## 2019-05-26 DIAGNOSIS — K219 Gastro-esophageal reflux disease without esophagitis: Secondary | ICD-10-CM | POA: Diagnosis not present

## 2019-05-26 DIAGNOSIS — D696 Thrombocytopenia, unspecified: Secondary | ICD-10-CM | POA: Diagnosis not present

## 2019-05-26 DIAGNOSIS — E1169 Type 2 diabetes mellitus with other specified complication: Secondary | ICD-10-CM | POA: Diagnosis not present

## 2019-05-26 DIAGNOSIS — I152 Hypertension secondary to endocrine disorders: Secondary | ICD-10-CM

## 2019-05-26 LAB — BAYER DCA HB A1C WAIVED: HB A1C (BAYER DCA - WAIVED): 6.9 % (ref <7.0)

## 2019-05-26 LAB — LIPID PANEL

## 2019-05-26 MED ORDER — ESOMEPRAZOLE MAGNESIUM 40 MG PO CPDR: DELAYED_RELEASE_CAPSULE | ORAL | 1 refills | Status: DC

## 2019-05-26 MED ORDER — CARVEDILOL 12.5 MG PO TABS: ORAL_TABLET | ORAL | 1 refills | Status: DC

## 2019-05-26 MED ORDER — GLIMEPIRIDE 4 MG PO TABS: ORAL_TABLET | ORAL | 1 refills | Status: DC

## 2019-05-26 MED ORDER — ICOSAPENT ETHYL 1 G PO CAPS: 2.0000 g | ORAL_CAPSULE | Freq: Two times a day (BID) | ORAL | 1 refills | Status: DC

## 2019-05-26 MED ORDER — TAMSULOSIN HCL 0.4 MG PO CAPS: ORAL_CAPSULE | ORAL | 1 refills | Status: DC

## 2019-05-26 MED ORDER — FENOFIBRIC ACID 135 MG PO CPDR: DELAYED_RELEASE_CAPSULE | ORAL | 1 refills | Status: DC

## 2019-05-26 MED ORDER — SITAGLIPTIN PHOSPHATE 25 MG PO TABS: 25.0000 mg | ORAL_TABLET | Freq: Every day | ORAL | 3 refills | Status: DC

## 2019-05-26 MED ORDER — ROSUVASTATIN CALCIUM 10 MG PO TABS: 10.0000 mg | ORAL_TABLET | Freq: Every day | ORAL | 1 refills | Status: DC

## 2019-05-26 MED ORDER — AMLODIPINE BESYLATE 10 MG PO TABS: 10.0000 mg | ORAL_TABLET | Freq: Every day | ORAL | 1 refills | Status: DC

## 2019-05-26 MED ORDER — FUROSEMIDE 20 MG PO TABS: ORAL_TABLET | ORAL | 1 refills | Status: DC

## 2019-05-26 NOTE — Progress Notes (Signed)
Subjective:  Patient ID: Timothy [name], male    DOB: May 07, 1941, 78 y.o.   MRN: [id number]  Chief Complaint:  Medical Management of Chronic Issues   HPI: Timothy [name] is a 78 y.o. male presenting on 05/26/2019 for Medical Management of Chronic Issues   1. Type 2 diabetes mellitus with diabetic nephropathy, without long-term current use of insulin (HCC)  Diabetes mellitus 2 Compliant with meds - Yes Checking CBGs? No Exercising regularly? - No Watching carbohydrate intake? - Yes Neuropathy ? - No Hypoglycemic events - No Pertinent ROS:  Polyuria - No Polydipsia - No Vision problems - No   2. Hypertension associated with type 2 diabetes mellitus ([place])  Complaint with meds - Yes Checking BP at home - No Exercising Regularly - No Watching Salt intake - Yes Pertinent ROS:  Headache - No Chest pain - No Dyspnea - No Palpitations - No LE edema - Intermittently  They report good compliance with medications and can restate their regimen by memory. No medication side effects.  BP Readings from Last 3 Encounters:  05/26/19 132/71  04/15/19 (!) 162/70  01/08/19 130/68     3. Hyperlipidemia associated with type 2 diabetes mellitus ([place])  Compliant with medications without associated side effects. Does try to watch diet. Does not exercise on a regular basis but is active daily.    4. CKD stage 3 due to type 2 diabetes mellitus (HCC)  No changes in urine output. No swelling, confusion, weakness, or back pain.   5. Thrombocytopenia (HCC)  No abnormal bruising or bleeding. No changes in stool color.    6. Gastroesophageal reflux disease without esophagitis  Well controlled with daily PPI. No sore throat, hemoptysis, voice change, or dysphagia.    7. Benign prostatic hyperplasia, unspecified whether lower urinary tract symptoms present  Doing well with flomax. Minimal nocturia. Will check PSA today.      Relevant past medical, surgical, family, and social history  reviewed and updated as indicated.  Allergies and medications reviewed and updated.   Past Medical History:  Diagnosis Date  . Cataract   . Chronic kidney disease   . Colon polyp   . Coronary artery disease    (left main normal, LAD 25-30% stenosis, distal 30-40% stenosis, circumflex obtuse marginal 50% stenosis,  right coronary artery dominant with long 75% followed by mid 80%  stenosis followed by 90% stenosis at the ostium of the PDA.  He had  stenting of the PDA by Dr. [name].  This was a Cypher stent.   First angioplasty was 1994, Cath 2011 100% RCA ).      . Diabetes mellitus without complication ([place])   . Essential hypertension, benign   . Glaucoma   . Hyperlipidemia   . Hyperplasia of prostate   . 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 Melanoma ([place])    melanoma both sides face, right ear  . Myocardial infarction ([place])    per pt/ had mild heart attacks/has 3 stents  . OSA (obstructive sleep apnea) 08/17/2016  . Sleep apnea    no c-pap    Past Surgical History:  Procedure Laterality Date  . APPENDECTOMY    . CERVICAL SPINE SURGERY    . CHOLECYSTECTOMY    . COLONOSCOPY  2004   multiple since  . CORONARY ANGIOPLASTY WITH STENT PLACEMENT     3 sents  . EYE SURGERY Bilateral    cataracts  . HERNIA REPAIR Bilateral   .  SKIN CANCER EXCISION  03/17/2017    Social History   Socioeconomic History  . Marital status: Married    Spouse name: Not on file  . Number of children: 2  . Years of education: Not on file  . Highest education level: Not on file  Occupational History  . Occupation: retired  Scientific laboratory technician  . Financial resource strain: Not on file  . Food insecurity:    Worry: Not on file    Inability: Not on file  . Transportation needs:    Medical: Not on file    Non-medical: Not on file  Tobacco Use  . Smoking status: Former Smoker    Packs/day: 2.00    Years: 25.00    Pack years: 50.00    Types: Cigarettes    Last attempt to  quit: 08/14/1991    Years since quitting: 27.8  . Smokeless tobacco: Never Used  Substance and Sexual Activity  . Alcohol use: Yes    Comment: rare  . Drug use: No  . Sexual activity: Yes  Lifestyle  . Physical activity:    Days per week: Not on file    Minutes per session: Not on file  . Stress: Not on file  Relationships  . Social connections:    Talks on phone: Not on file    Gets together: Not on file    Attends religious service: Not on file    Active member of club or organization: Not on file    Attends meetings of clubs or organizations: Not on file    Relationship status: Not on file  . Intimate partner violence:    Fear of current or ex partner: Not on file    Emotionally abused: Not on file    Physically abused: Not on file    Forced sexual activity: Not on file  Other Topics Concern  . Not on file  Social History Narrative  . Not on file    Outpatient Encounter Medications as of 05/26/2019  Medication Sig  . amLODipine (NORVASC) 10 MG tablet Take 1 tablet (10 mg total) by mouth daily.  Marland Kitchen aspirin 81 MG EC tablet Take 81 mg by mouth daily.    . carvedilol (COREG) 12.5 MG tablet TAKE  (1)  TABLET TWICE A DAY WITH MEALS (BREAKFAST AND SUPPER)  . Choline Fenofibrate (FENOFIBRIC ACID) 135 MG CPDR TAKE (1) CAPSULE DAILY  . esomeprazole (NEXIUM) 40 MG capsule TAKE (1) CAPSULE DAILY  . furosemide (LASIX) 20 MG tablet Take 1/2 tablet (10 mg total) by mouth daily.  Marland Kitchen glimepiride (AMARYL) 4 MG tablet TAKE (1) TABLET TWICE A DAY.  Marland Kitchen Icosapent Ethyl (VASCEPA) 1 g CAPS Take 2 capsules (2 g total) by mouth 2 (two) times daily.  Marland Kitchen latanoprost (XALATAN) 0.005 % ophthalmic solution Place 1 drop into both eyes at bedtime.  . nitroGLYCERIN (NITROSTAT) 0.4 MG SL tablet Place 1 tablet (0.4 mg total) under the tongue every 5 (five) minutes as needed.  . rosuvastatin (CRESTOR) 10 MG tablet Take 1 tablet (10 mg total) by mouth daily.  . sitaGLIPtin (JANUVIA) 25 MG tablet Take 1 tablet (25  mg total) by mouth daily. For diabetes  . tamsulosin (FLOMAX) 0.4 MG CAPS capsule TAKE (1) CAPSULE DAILY  . [DISCONTINUED] amLODipine (NORVASC) 10 MG tablet TAKE 1 TABLET DAILY  . [DISCONTINUED] carvedilol (COREG) 12.5 MG tablet TAKE  (1)  TABLET TWICE A DAY WITH MEALS (BREAKFAST AND SUPPER)  . [DISCONTINUED] Choline Fenofibrate (FENOFIBRIC ACID) 135 MG CPDR TAKE (  1) CAPSULE DAILY  . [DISCONTINUED] esomeprazole (NEXIUM) 40 MG capsule TAKE (1) CAPSULE DAILY  . [DISCONTINUED] furosemide (LASIX) 20 MG tablet Take 1/2 tablet (10 mg total) by mouth daily.  . [DISCONTINUED] glimepiride (AMARYL) 4 MG tablet TAKE (1) TABLET TWICE A DAY.  . [DISCONTINUED] Icosapent Ethyl (VASCEPA) 1 g CAPS Take 2 capsules (2 g total) by mouth 2 (two) times daily.  . [DISCONTINUED] rosuvastatin (CRESTOR) 10 MG tablet TAKE 1 TABLET DAILY  . [DISCONTINUED] sitaGLIPtin (JANUVIA) 25 MG tablet Take 1 tablet (25 mg total) by mouth daily. For diabetes  . [DISCONTINUED] tamsulosin (FLOMAX) 0.4 MG CAPS capsule TAKE (1) CAPSULE DAILY  . Probiotic Product (PROBIOTIC DAILY PO) Take 1 application by mouth daily at 12 noon.  . [DISCONTINUED] MYRBETRIQ 50 MG TB24 tablet Take 50 mg by mouth daily.   No facility-administered encounter medications on file as of 05/26/2019.     Allergies  Allergen Reactions  . Hydrocodone-Acetaminophen Other (See Comments)    Makes pt. Feel loopy    Review of Systems  Constitutional: Negative for appetite change, fatigue, fever and unexpected weight change.  Eyes: Negative for photophobia and visual disturbance.  Respiratory: Negative for cough, chest tightness and shortness of breath.   Cardiovascular: Negative for chest pain, palpitations and leg swelling.  Gastrointestinal: Negative for abdominal pain, anal bleeding, blood in stool, diarrhea, nausea and vomiting.  Endocrine: Negative for cold intolerance, heat intolerance, polydipsia, polyphagia and polyuria.  Genitourinary: Negative for  difficulty urinating.  Musculoskeletal: Negative for arthralgias and myalgias.  Neurological: Negative for dizziness, tremors, seizures, syncope, facial asymmetry, speech difficulty, weakness, light-headedness, numbness and headaches.  Hematological: Does not bruise/bleed easily.  Psychiatric/Behavioral: Negative for confusion.  All other systems reviewed and are negative.       Objective:  BP 132/71   Pulse 77   Temp 97.7 F (36.5 C) (Oral)   Ht '5\' 7"'  (1.702 m)   Wt 186 lb (84.4 kg)   BMI 29.13 kg/m    Wt Readings from Last 3 Encounters:  05/26/19 186 lb (84.4 kg)  04/15/19 188 lb (85.3 kg)  01/08/19 197 lb (89.4 kg)    Physical Exam Vitals signs and nursing note reviewed.  Constitutional:      General: He is not in acute distress.    Appearance: Normal appearance. He is well-developed and well-groomed. He is not ill-appearing, toxic-appearing or diaphoretic.  HENT:     Head: Normocephalic and atraumatic.     Jaw: There is normal jaw occlusion.     Right Ear: Hearing normal.     Left Ear: Hearing normal.     Nose: Nose normal.     Mouth/Throat:     Lips: Pink.     Mouth: Mucous membranes are moist.     Pharynx: Oropharynx is clear. Uvula midline.  Eyes:     General: Lids are normal.     Extraocular Movements: Extraocular movements intact.     Conjunctiva/sclera: Conjunctivae normal.     Pupils: Pupils are equal, round, and reactive to light.  Neck:     Musculoskeletal: Normal range of motion and neck supple.     Thyroid: No thyroid mass, thyromegaly or thyroid tenderness.     Vascular: No carotid bruit or JVD.     Trachea: Trachea and phonation normal.  Cardiovascular:     Rate and Rhythm: Normal rate and regular rhythm.     Chest Wall: PMI is not displaced.     Pulses: Normal pulses.  Heart sounds: Normal heart sounds. No murmur. No friction rub. No gallop.   Pulmonary:     Effort: Pulmonary effort is normal. No respiratory distress.     Breath sounds:  Normal breath sounds. No wheezing.  Abdominal:     General: Bowel sounds are normal. There is no distension or abdominal bruit.     Palpations: Abdomen is soft. There is no hepatomegaly or splenomegaly.     Tenderness: There is no abdominal tenderness. There is no right CVA tenderness or left CVA tenderness.     Hernia: No hernia is present.  Musculoskeletal: Normal range of motion.     Right lower leg: No edema.     Left lower leg: No edema.  Lymphadenopathy:     Cervical: No cervical adenopathy.  Skin:    General: Skin is warm and dry.     Capillary Refill: Capillary refill takes less than 2 seconds.     Coloration: Skin is not cyanotic, jaundiced or pale.     Findings: No rash.  Neurological:     General: No focal deficit present.     Mental Status: He is alert and oriented to person, place, and time.     Cranial Nerves: Cranial nerves are intact.     Sensory: Sensation is intact.     Motor: Motor function is intact.     Coordination: Coordination is intact.     Gait: Gait is intact.     Deep Tendon Reflexes: Reflexes are normal and symmetric.  Psychiatric:        Attention and Perception: Attention and perception normal.        Mood and Affect: Mood and affect normal.        Speech: Speech normal.        Behavior: Behavior normal. Behavior is cooperative.        Thought Content: Thought content normal.        Cognition and Memory: Cognition and memory normal.        Judgment: Judgment normal.     Results for orders placed or performed in visit on 10/09/18  Lipid panel  Result Value Ref Range   Cholesterol, Total 147 100 - 199 mg/dL   Triglycerides 520 (H) 0 - 149 mg/dL   HDL 19 (L) >39 mg/dL   VLDL Cholesterol Cal Comment 5 - 40 mg/dL   LDL Calculated Comment 0 - 99 mg/dL   Chol/HDL Ratio 7.7 (H) 0.0 - 5.0 ratio  LDL cholesterol, direct  Result Value Ref Range   LDL Direct 56 0 - 99 mg/dL  Lipid panel  Result Value Ref Range   Cholesterol, Total 147 100 - 199  mg/dL   Triglycerides 344 (H) 0 - 149 mg/dL   HDL 24 (L) >39 mg/dL   VLDL Cholesterol Cal 69 (H) 5 - 40 mg/dL   LDL Calculated 54 0 - 99 mg/dL   Chol/HDL Ratio 6.1 (H) 0.0 - 5.0 ratio  LDL cholesterol, direct  Result Value Ref Range   LDL Direct 73 0 - 99 mg/dL       Pertinent labs & imaging results that were available during my care of the patient were reviewed by me and considered in my medical decision making.  Assessment & Plan:  Esaiah was seen today for medical management of chronic issues.  Diagnoses and all orders for this visit:  Type 2 diabetes mellitus with diabetic nephropathy, without long-term current use of insulin (HCC) A1C 6.9 today. Diet and exercise encouraged.  Labs pending. Continue below.  -     CMP14+EGFR -     Microalbumin / creatinine urine ratio -     Bayer DCA Hb A1c Waived -     glimepiride (AMARYL) 4 MG tablet; TAKE (1) TABLET TWICE A DAY. -     sitaGLIPtin (JANUVIA) 25 MG tablet; Take 1 tablet (25 mg total) by mouth daily. For diabetes  Hypertension associated with type 2 diabetes mellitus [facility]) Cardiology was concerned due to last BP being elevated. Normal BP reading today in office. Will not change medications. BP log provided and pt instructed to check BP at least 3 times per week and report any persistent highs or lows. DASH diet. Labs pending. Continue medications as prescribed.  -     CBC with Differential/Platelet -     TSH -     Microalbumin / creatinine urine ratio -     amLODipine (NORVASC) 10 MG tablet; Take 1 tablet (10 mg total) by mouth daily. -     carvedilol (COREG) 12.5 MG tablet; TAKE  (1)  TABLET TWICE A DAY WITH MEALS (BREAKFAST AND SUPPER) -     furosemide (LASIX) 20 MG tablet; Take 1/2 tablet (10 mg total) by mouth daily.  Hyperlipidemia associated with type 2 diabetes mellitus ([place]) Diet and exercise encouraged. Labs pending. Continue below.  -     Lipid panel -     Choline Fenofibrate (FENOFIBRIC ACID) 135 MG CPDR; TAKE (1)  CAPSULE DAILY -     Icosapent Ethyl (VASCEPA) 1 g CAPS; Take 2 capsules (2 g total) by mouth 2 (two) times daily. -     rosuvastatin (CRESTOR) 10 MG tablet; Take 1 tablet (10 mg total) by mouth daily.  CKD stage 3 due to type 2 diabetes mellitus ([place]) Labs pending.  -     CMP14+EGFR -     CBC with Differential/Platelet  Thrombocytopenia (HCC) Labs pending.  -     CBC with Differential/Platelet  Gastroesophageal reflux disease without esophagitis Stable, continue below. -     esomeprazole (NEXIUM) 40 MG capsule; TAKE (1) CAPSULE DAILY  Benign prostatic hyperplasia, unspecified whether lower urinary tract symptoms present Well controlled with below. PSA ordered. Continue below.  -     tamsulosin (FLOMAX) 0.4 MG CAPS capsule; TAKE (1) CAPSULE DAILY     Continue all other maintenance medications.  Follow up plan: Return in about 3 months (around 08/26/2019), or if symptoms worsen or fail to improve, for DM, HTN.  Educational handout given for Diabetes   The above assessment and management plan was discussed with the patient. The patient verbalized understanding of and has agreed to the management plan. Patient is aware to call the clinic if symptoms persist or worsen. Patient is aware when to return to the clinic for a follow-up visit. Patient educated on when it is appropriate to go to the emergency department.   Monia Pouch, FNP-C [facility] [phone]

## 2019-05-26 NOTE — Patient Instructions (Signed)
Diabetes Basics    Diabetes (diabetes mellitus) is a long-term (chronic) disease. It occurs when the body does not properly use sugar (glucose) that is released from food after you eat.  Diabetes may be caused by one or both of these problems:  · Your pancreas does not make enough of a hormone called insulin.  · Your body does not react in a normal way to insulin that it makes.  Insulin lets sugars (glucose) go into cells in your body. This gives you energy. If you have diabetes, sugars cannot get into cells. This causes high blood sugar (hyperglycemia).  Follow these instructions at home:  How is diabetes treated?  You may need to take insulin or other diabetes medicines daily to keep your blood sugar in balance. Take your diabetes medicines every day as told by your doctor. List your diabetes medicines here:  Diabetes medicines  · Name of medicine: ______________________________  ? Amount (dose): _______________ Time (a.m./p.m.): _______________ Notes: ___________________________________  · Name of medicine: ______________________________  ? Amount (dose): _______________ Time (a.m./p.m.): _______________ Notes: ___________________________________  · Name of medicine: ______________________________  ? Amount (dose): _______________ Time (a.m./p.m.): _______________ Notes: ___________________________________  If you use insulin, you will learn how to give yourself insulin by injection. You may need to adjust the amount based on the food that you eat. List the types of insulin you use here:  Insulin  · Insulin type: ______________________________  ? Amount (dose): _______________ Time (a.m./p.m.): _______________ Notes: ___________________________________  · Insulin type: ______________________________  ? Amount (dose): _______________ Time (a.m./p.m.): _______________ Notes: ___________________________________  · Insulin type: ______________________________  ? Amount (dose): _______________ Time (a.m./p.m.):  _______________ Notes: ___________________________________  · Insulin type: ______________________________  ? Amount (dose): _______________ Time (a.m./p.m.): _______________ Notes: ___________________________________  · Insulin type: ______________________________  ? Amount (dose): _______________ Time (a.m./p.m.): _______________ Notes: ___________________________________  How do I manage my blood sugar?    Check your blood sugar levels using a blood glucose monitor as directed by your doctor.  Your doctor will set treatment goals for you. Generally, you should have these blood sugar levels:  · Before meals (preprandial): 80-130 mg/dL (4.4-7.2 mmol/L).  · After meals (postprandial): below 180 mg/dL (10 mmol/L).  · A1c level: less than 7%.  Write down the times that you will check your blood sugar levels:  Blood sugar checks  · Time: _______________ Notes: ___________________________________  · Time: _______________ Notes: ___________________________________  · Time: _______________ Notes: ___________________________________  · Time: _______________ Notes: ___________________________________  · Time: _______________ Notes: ___________________________________  · Time: _______________ Notes: ___________________________________    What do I need to know about low blood sugar?  Low blood sugar is called hypoglycemia. This is when blood sugar is at or below 70 mg/dL (3.9 mmol/L). Symptoms may include:  · Feeling:  ? Hungry.  ? Worried or nervous (anxious).  ? Sweaty and clammy.  ? Confused.  ? Dizzy.  ? Sleepy.  ? Sick to your stomach (nauseous).  · Having:  ? A fast heartbeat.  ? A headache.  ? A change in your vision.  ? Tingling or no feeling (numbness) around the mouth, lips, or tongue.  ? Jerky movements that you cannot control (seizure).  · Having trouble with:  ? Moving (coordination).  ? Sleeping.  ? Passing out (fainting).  ? Getting upset easily (irritability).  Treating low blood sugar  To treat low blood  sugar, eat or drink something sugary right away. If you can think clearly and swallow safely, follow the 15:15   rule:  · Take 15 grams of a fast-acting carb (carbohydrate). Talk with your doctor about how much you should take.  · Some fast-acting carbs are:  ? Sugar tablets (glucose pills). Take 3-4 glucose pills.  ? 6-8 pieces of hard candy.  ? 4-6 oz (120-150 mL) of fruit juice.  ? 4-6 oz (120-150 mL) of regular (not diet) soda.  ? 1 Tbsp (15 mL) honey or sugar.  · Check your blood sugar 15 minutes after you take the carb.  · If your blood sugar is still at or below 70 mg/dL (3.9 mmol/L), take 15 grams of a carb again.  · If your blood sugar does not go above 70 mg/dL (3.9 mmol/L) after 3 tries, get help right away.  · After your blood sugar goes back to normal, eat a meal or a snack within 1 hour.  Treating very low blood sugar  If your blood sugar is at or below 54 mg/dL (3 mmol/L), you have very low blood sugar (severe hypoglycemia). This is an emergency. Do not wait to see if the symptoms will go away. Get medical help right away. Call your local emergency services (911 in the [place] Do not drive yourself to the hospital.  Questions to ask your health care provider  · Do I need to meet with a diabetes educator?  · What equipment will I need to care for myself at home?  · What diabetes medicines do I need? When should I take them?  · How often do I need to check my blood sugar?  · What number can I call if I have questions?  · When is my next doctor's visit?  · Where can I find a support group for people with diabetes?  Where to find more information  · American Diabetes Association: www.diabetes.org  · American Association of Diabetes Educators: www.diabeteseducator.org/patient-resources  Contact a doctor if:  · Your blood sugar is at or above 240 mg/dL (13.3 mmol/L) for 2 days in a row.  · You have been sick or have had a fever for 2 days or more, and you are not getting better.  · You have any of these  problems for more than 6 hours:  ? You cannot eat or drink.  ? You feel sick to your stomach (nauseous).  ? You throw up (vomit).  ? You have watery poop (diarrhea).  Get help right away if:  · Your blood sugar is lower than 54 mg/dL (3 mmol/L).  · You get confused.  · You have trouble:  ? Thinking clearly.  ? Breathing.  Summary  · Diabetes (diabetes mellitus) is a long-term (chronic) disease. It occurs when the body does not properly use sugar (glucose) that is released from food after digestion.  · Take insulin and diabetes medicines as told.  · Check your blood sugar every day, as often as told.  · Keep all follow-up visits as told by your doctor. This is important.  This information is not intended to replace advice given to you by your health care provider. Make sure you discuss any questions you have with your health care provider.  Document Released: 03/07/2018 Document Revised: 05/26/2018 Document Reviewed: 03/07/2018  Elsevier Interactive Patient Education © 2019 [employer]

## 2019-05-27 LAB — CMP14+EGFR
ALT: 18 IU/L (ref 0–44)
AST: 22 IU/L (ref 0–40)
Albumin/Globulin Ratio: 1.7 (ref 1.2–2.2)
Albumin: 4.2 g/dL (ref 3.7–4.7)
Alkaline Phosphatase: 48 IU/L (ref 39–117)
BUN/Creatinine Ratio: 12 (ref 10–24)
BUN: 18 mg/dL (ref 8–27)
Bilirubin Total: 0.4 mg/dL (ref 0.0–1.2)
CO2: 20 mmol/L (ref 20–29)
Calcium: 9.3 mg/dL (ref 8.6–10.2)
Chloride: 102 mmol/L (ref 96–106)
Creatinine, Ser: 1.5 mg/dL — ABNORMAL HIGH (ref 0.76–1.27)
GFR calc Af Amer: 51 mL/min/{1.73_m2} — ABNORMAL LOW (ref >59)
GFR calc non Af Amer: 44 mL/min/{1.73_m2} — ABNORMAL LOW (ref >59)
Globulin, Total: 2.5 g/dL (ref 1.5–4.5)
Glucose: 183 mg/dL — ABNORMAL HIGH (ref 65–99)
Potassium: 3.7 mmol/L (ref 3.5–5.2)
Sodium: 138 mmol/L (ref 134–144)
Total Protein: 6.7 g/dL (ref 6.0–8.5)

## 2019-05-27 LAB — CBC WITH DIFFERENTIAL/PLATELET
Basophils Absolute: 0 10*3/uL (ref 0.0–0.2)
Basos: 1 %
EOS (ABSOLUTE): 0.1 10*3/uL (ref 0.0–0.4)
Eos: 2 %
Hematocrit: 38.1 % (ref 37.5–51.0)
Hemoglobin: 12.8 g/dL — ABNORMAL LOW (ref 13.0–17.7)
Immature Grans (Abs): 0 10*3/uL (ref 0.0–0.1)
Immature Granulocytes: 1 %
Lymphocytes Absolute: 1.9 10*3/uL (ref 0.7–3.1)
Lymphs: 31 %
MCH: 29.4 pg (ref 26.6–33.0)
MCHC: 33.6 g/dL (ref 31.5–35.7)
MCV: 87 fL (ref 79–97)
Monocytes Absolute: 0.5 10*3/uL (ref 0.1–0.9)
Monocytes: 8 %
Neutrophils Absolute: 3.6 10*3/uL (ref 1.4–7.0)
Neutrophils: 57 %
Platelets: 159 10*3/uL (ref 150–450)
RBC: 4.36 x10E6/uL (ref 4.14–5.80)
RDW: 13 % (ref 11.6–15.4)
WBC: 6.2 10*3/uL (ref 3.4–10.8)

## 2019-05-27 LAB — LIPID PANEL
Chol/HDL Ratio: 6.6 ratio — ABNORMAL HIGH (ref 0.0–5.0)
Cholesterol, Total: 125 mg/dL (ref 100–199)
HDL: 19 mg/dL — ABNORMAL LOW (ref >39)
LDL Calculated: 47 mg/dL (ref 0–99)
Triglycerides: 296 mg/dL — ABNORMAL HIGH (ref 0–149)
VLDL Cholesterol Cal: 59 mg/dL — ABNORMAL HIGH (ref 5–40)

## 2019-05-27 LAB — MICROALBUMIN / CREATININE URINE RATIO
Creatinine, Urine: 96.1 mg/dL
Microalb/Creat Ratio: 123 mg/g creat — ABNORMAL HIGH (ref 0–29)
Microalbumin, Urine: 117.8 ug/mL

## 2019-05-27 LAB — TSH: TSH: 2.25 u[IU]/mL (ref 0.450–4.500)

## 2019-05-27 MED ORDER — ENALAPRIL MALEATE 2.5 MG PO TABS: 2.5000 mg | ORAL_TABLET | Freq: Every day | ORAL | 3 refills | Status: DC

## 2019-05-27 NOTE — Addendum Note (Signed)
Addended by: Baruch Gouty on: 05/27/2019 02:51 PM   Modules accepted: Orders

## 2019-05-29 LAB — PSA, TOTAL AND FREE
PSA, Free Pct: 70 %
PSA, Free: 0.14 ng/mL
Prostate Specific Ag, Serum: 0.2 ng/mL (ref 0.0–4.0)

## 2019-05-29 LAB — SPECIMEN STATUS REPORT

## 2019-06-11 ENCOUNTER — Other Ambulatory Visit: Payer: Self-pay | Admitting: *Deleted

## 2019-06-11 DIAGNOSIS — N183 Chronic kidney disease, stage 3 unspecified: Secondary | ICD-10-CM

## 2019-06-11 DIAGNOSIS — E1122 Type 2 diabetes mellitus with diabetic chronic kidney disease: Secondary | ICD-10-CM

## 2019-06-11 MED ORDER — ENALAPRIL MALEATE 2.5 MG PO TABS: 2.5000 mg | ORAL_TABLET | Freq: Every day | ORAL | 3 refills | Status: DC

## 2019-07-02 ENCOUNTER — Encounter: Payer: Self-pay | Admitting: *Deleted

## 2019-07-19 IMAGING — CT CT ABD-PELV W/O CM
2 of 4 series · 16 of 46 positions shown, 18 images · non-contrast
Comparison: None.

CLINICAL DATA: Low-grade bowel obstruction. Nausea, diarrhea, and
cramping for 4 days

EXAM:
CT ABDOMEN AND PELVIS WITHOUT CONTRAST
TECHNIQUE: Multidetector CT imaging of the abdomen and pelvis was performed
following the standard protocol without IV contrast.

[Series 3: abd/ pelvis 5.0 i30f 2 · axial · 0.76mm/px · z∈[+624,+1119]mm · 13 of 109 slices shown, 15 images]
[im 5/109  soft-tissue]
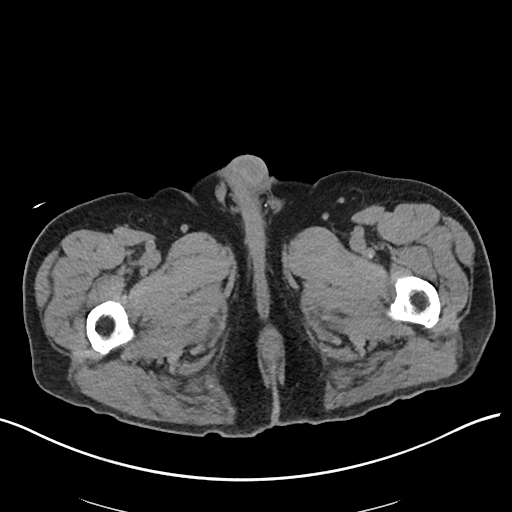
[im 5/109  bone]
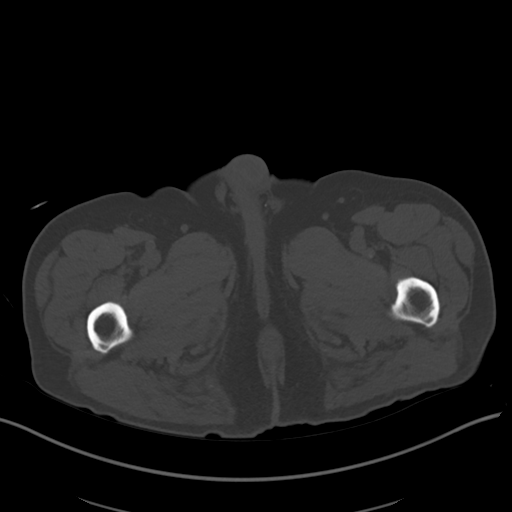
[im 14/109  soft-tissue]
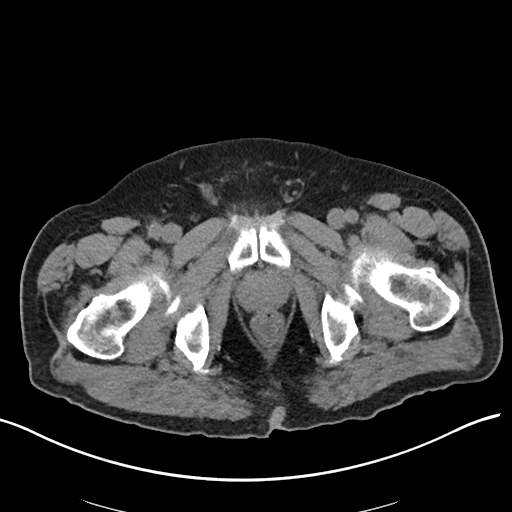
[im 23/109  soft-tissue]
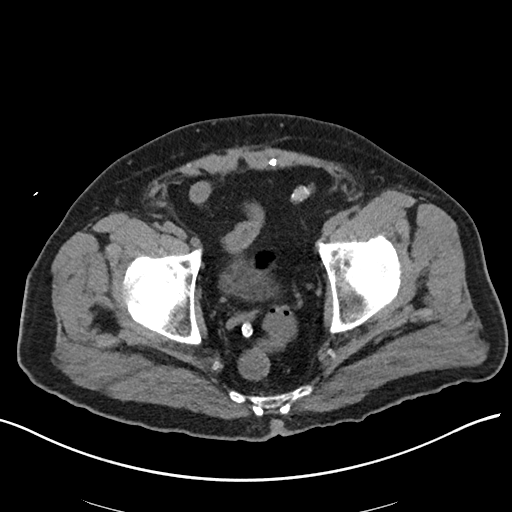
[im 32/109  soft-tissue]
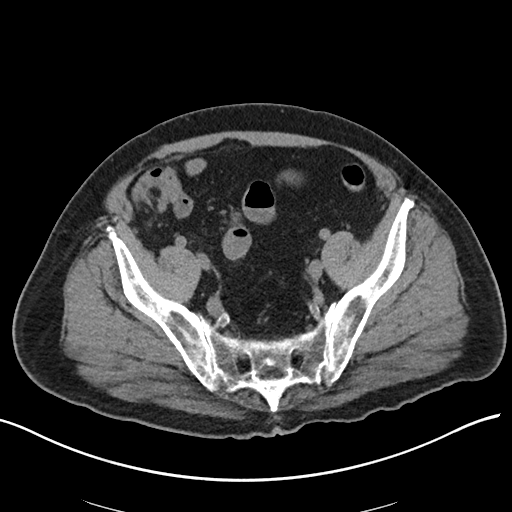
[im 37/109  soft-tissue]
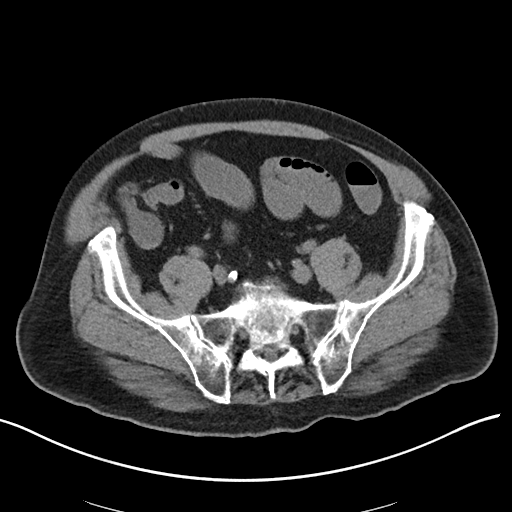
[im 46/109  soft-tissue]
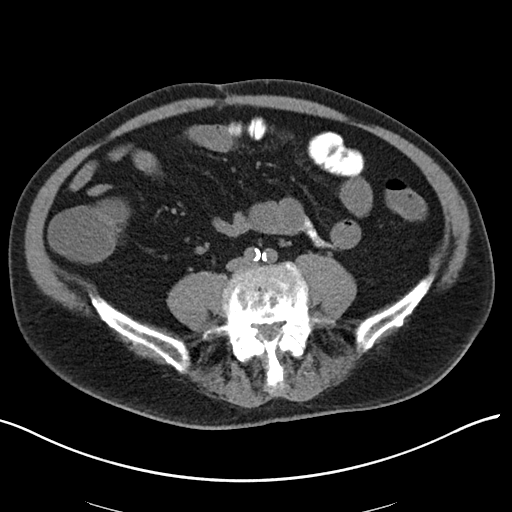
[im 55/109  soft-tissue]
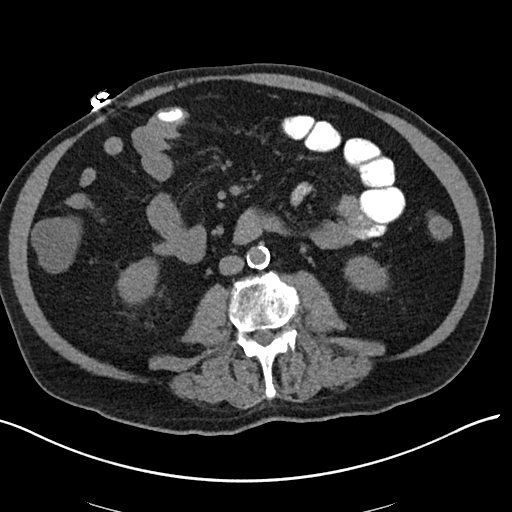
[im 64/109  soft-tissue]
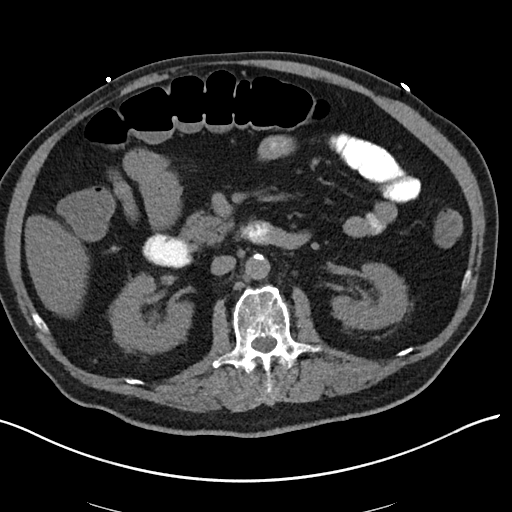
[im 73/109  soft-tissue]
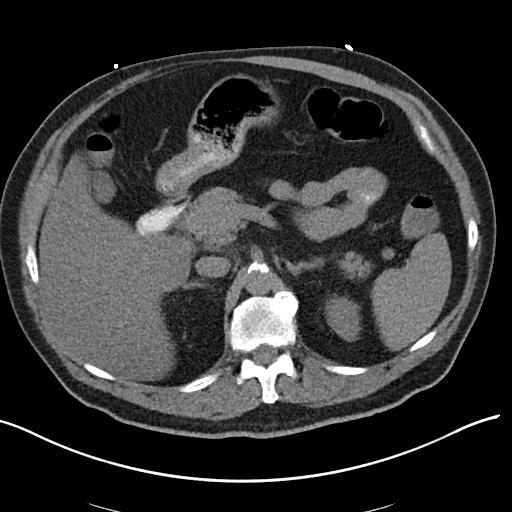
[im 73/109  bone]
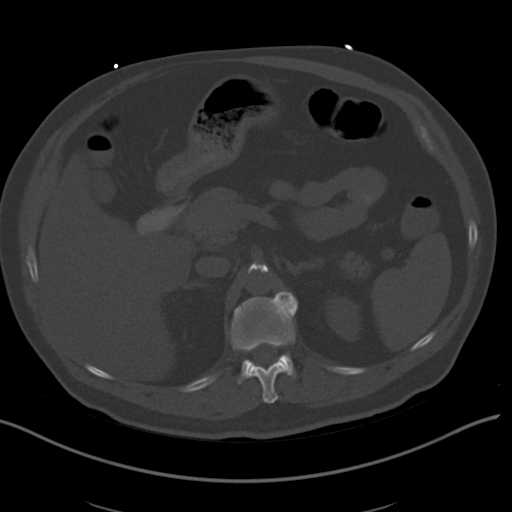
[im 77/109  soft-tissue]
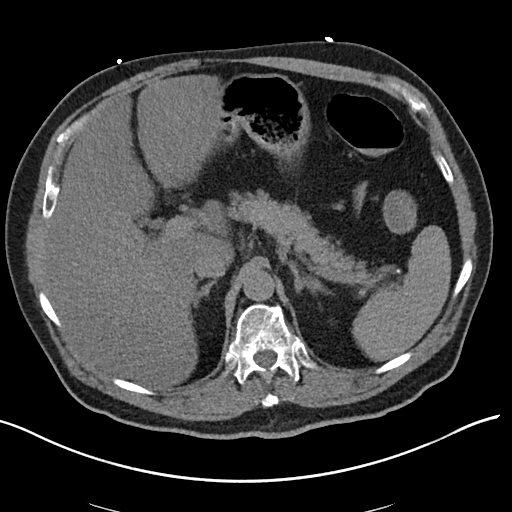
[im 86/109  soft-tissue]
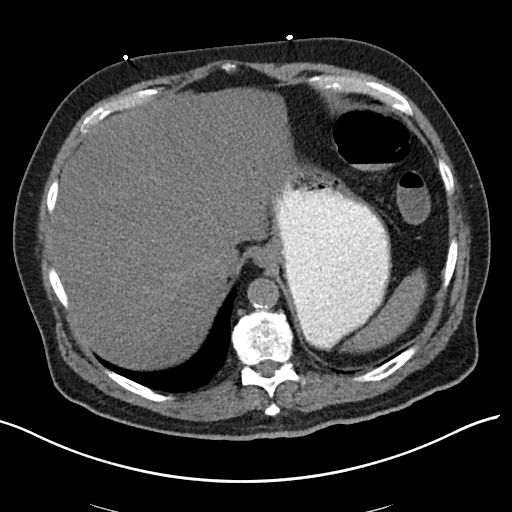
[im 95/109  soft-tissue]
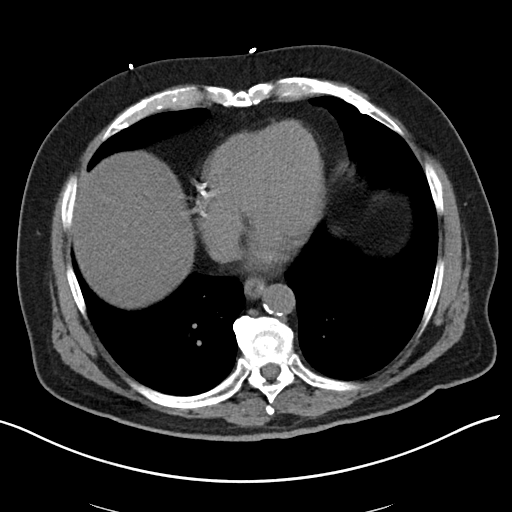
[im 104/109  soft-tissue]
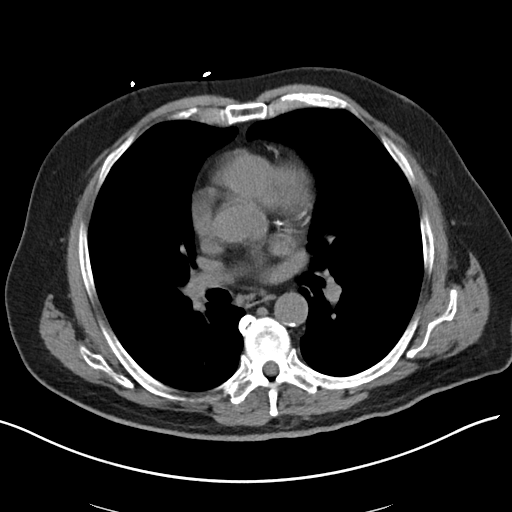

[Series 6: cor st · coronal · 0.90mm/px · 3 of 105 slices shown]
[im 35/105  soft-tissue]
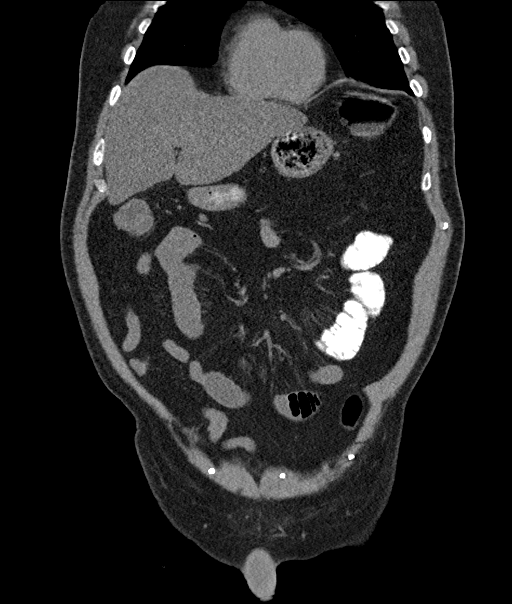
[im 47/105  soft-tissue]
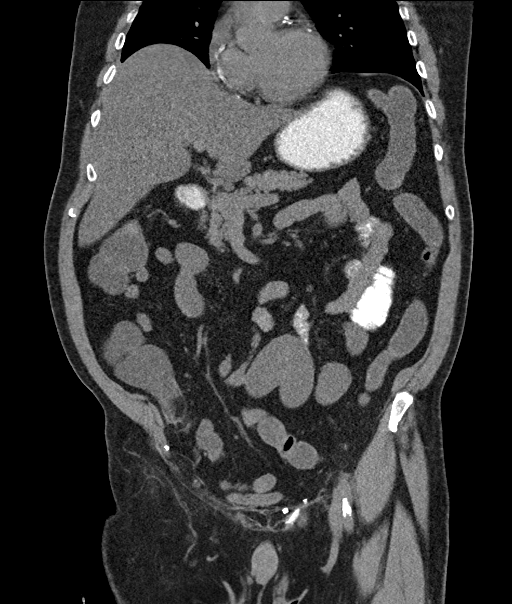
[im 58/105  soft-tissue]
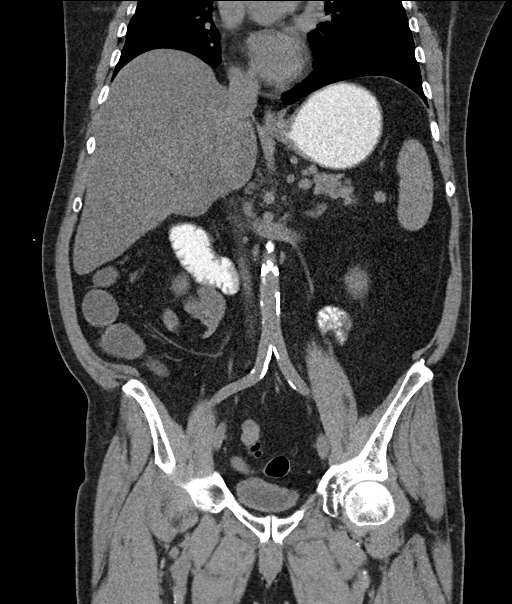

[16 of 46 positions shown; findings below may reference images not displayed]

FINDINGS: Lower chest:  Extensive coronary atherosclerosis.

Hepatobiliary: Hepatic steatosis without focal abnormality.The
gallbladder is absent. No ductal dilatation.

Pancreas: Unremarkable.

Spleen: Unremarkable.

Adrenals/Urinary Tract: Negative adrenals. No hydronephrosis or
stone. Unremarkable bladder.

Stomach/Bowel: Contrast has only reached the proximal small bowel.
There are proximal small bowel fluid levels and ileal loops are
decompressed, but no discrete transition point is seen and there is
no fecalization. Fluid is also seen throughout the colon, reaching
the sigmoid, and correlating with history of diarrhea. No detected
bowel wall thickening. Appendectomy.

Vascular/Lymphatic: Atherosclerotic calcification. No mass or
adenopathy.

Reproductive:Negative.

Other: No ascites or pneumoperitoneum. Calcified nodule in the left
lower quadrant attributed to old inflammation. Remote bilateral
inguinal hernia repair.

Musculoskeletal: Advanced spondylosis and degenerative disc
narrowing with posterior element hypertrophy. No acute osseous
finding.
IMPRESSION: 1. There is proximal small bowel fluid levels and distal small bowel
collapse but no transition point is seen and there is fluid
throughout the colon. Enteritis and diarrheal illness is favored
over partial small bowel obstruction. Radiographs could be used to
follow the oral contrast through the bowel if continued clinical
concern for obstruction.
2. Prominent hepatic steatosis.
3. Atherosclerosis that is extensive in the coronaries.

## 2019-08-10 DIAGNOSIS — M5136 Other intervertebral disc degeneration, lumbar region: Secondary | ICD-10-CM | POA: Diagnosis not present

## 2019-08-10 DIAGNOSIS — M48061 Spinal stenosis, lumbar region without neurogenic claudication: Secondary | ICD-10-CM | POA: Diagnosis not present

## 2019-08-18 DIAGNOSIS — Z961 Presence of intraocular lens: Secondary | ICD-10-CM | POA: Diagnosis not present

## 2019-08-18 DIAGNOSIS — E119 Type 2 diabetes mellitus without complications: Secondary | ICD-10-CM | POA: Diagnosis not present

## 2019-08-18 DIAGNOSIS — H40053 Ocular hypertension, bilateral: Secondary | ICD-10-CM | POA: Diagnosis not present

## 2019-08-18 DIAGNOSIS — H04123 Dry eye syndrome of bilateral lacrimal glands: Secondary | ICD-10-CM | POA: Diagnosis not present

## 2019-08-18 DIAGNOSIS — H2589 Other age-related cataract: Secondary | ICD-10-CM | POA: Diagnosis not present

## 2019-08-18 DIAGNOSIS — H26492 Other secondary cataract, left eye: Secondary | ICD-10-CM | POA: Diagnosis not present

## 2019-08-18 LAB — HM DIABETES EYE EXAM

## 2019-10-08 DIAGNOSIS — M48061 Spinal stenosis, lumbar region without neurogenic claudication: Secondary | ICD-10-CM | POA: Diagnosis not present

## 2019-10-14 ENCOUNTER — Other Ambulatory Visit: Payer: Self-pay | Admitting: Dermatology

## 2019-10-14 DIAGNOSIS — C44622 Squamous cell carcinoma of skin of right upper limb, including shoulder: Secondary | ICD-10-CM | POA: Diagnosis not present

## 2019-10-14 DIAGNOSIS — L820 Inflamed seborrheic keratosis: Secondary | ICD-10-CM | POA: Diagnosis not present

## 2019-10-14 DIAGNOSIS — D485 Neoplasm of uncertain behavior of skin: Secondary | ICD-10-CM | POA: Diagnosis not present

## 2019-10-14 DIAGNOSIS — L570 Actinic keratosis: Secondary | ICD-10-CM | POA: Diagnosis not present

## 2019-11-05 ENCOUNTER — Other Ambulatory Visit: Payer: Self-pay

## 2019-11-05 DIAGNOSIS — C44622 Squamous cell carcinoma of skin of right upper limb, including shoulder: Secondary | ICD-10-CM | POA: Diagnosis not present

## 2019-11-06 ENCOUNTER — Ambulatory Visit (INDEPENDENT_AMBULATORY_CARE_PROVIDER_SITE_OTHER): Payer: Medicare HMO

## 2019-11-06 DIAGNOSIS — Z23 Encounter for immunization: Secondary | ICD-10-CM

## 2019-12-08 ENCOUNTER — Other Ambulatory Visit: Payer: Self-pay | Admitting: Family Medicine

## 2019-12-08 DIAGNOSIS — I152 Hypertension secondary to endocrine disorders: Secondary | ICD-10-CM

## 2019-12-08 DIAGNOSIS — E1121 Type 2 diabetes mellitus with diabetic nephropathy: Secondary | ICD-10-CM

## 2019-12-08 DIAGNOSIS — N400 Enlarged prostate without lower urinary tract symptoms: Secondary | ICD-10-CM

## 2019-12-08 DIAGNOSIS — E1159 Type 2 diabetes mellitus with other circulatory complications: Secondary | ICD-10-CM

## 2020-01-25 ENCOUNTER — Other Ambulatory Visit: Payer: Self-pay | Admitting: Dermatology

## 2020-02-03 ENCOUNTER — Other Ambulatory Visit: Payer: Self-pay | Admitting: Family Medicine

## 2020-02-03 DIAGNOSIS — E1169 Type 2 diabetes mellitus with other specified complication: Secondary | ICD-10-CM

## 2020-02-03 DIAGNOSIS — K219 Gastro-esophageal reflux disease without esophagitis: Secondary | ICD-10-CM

## 2020-02-03 DIAGNOSIS — E785 Hyperlipidemia, unspecified: Secondary | ICD-10-CM

## 2020-02-26 ENCOUNTER — Telehealth: Payer: Self-pay | Admitting: Internal Medicine

## 2020-02-26 DIAGNOSIS — E781 Pure hyperglyceridemia: Secondary | ICD-10-CM

## 2020-02-26 DIAGNOSIS — E782 Mixed hyperlipidemia: Secondary | ICD-10-CM

## 2020-02-26 NOTE — Telephone Encounter (Signed)
New Message   Patient is calling because he was told that he needed lab work prior to making an appt. Please call to advise.

## 2020-02-26 NOTE — Telephone Encounter (Signed)
Pt calling today about his over due lipid clinic appointment with Dr. [name]. He would like to have his lab work completed prior to his visit. I advised pt I would forward to Dr. Lysbeth Penner nurse to determine what type of lab work he will need. Pt was offered an appt with Dr. [name], but he declined. He would like to speak with his nurse first.  I was able to schedule his annual follow up with Dr. Percival Spanish in [place].   He had no additional needs at this time.

## 2020-02-26 NOTE — Telephone Encounter (Signed)
Patient called and made aware that lab work has been ordered. He plans to have labs completed soon and asked that I call him next week to schedule an appointment.

## 2020-03-01 ENCOUNTER — Other Ambulatory Visit: Payer: Self-pay

## 2020-03-01 ENCOUNTER — Other Ambulatory Visit: Payer: Medicare HMO

## 2020-03-02 LAB — LIPID PANEL
Chol/HDL Ratio: 5.9 ratio — ABNORMAL HIGH (ref 0.0–5.0)
Cholesterol, Total: 148 mg/dL (ref 100–199)
HDL: 25 mg/dL — ABNORMAL LOW (ref >39)
LDL Chol Calc (NIH): 82 mg/dL (ref 0–99)
Triglycerides: 247 mg/dL — ABNORMAL HIGH (ref 0–149)
VLDL Cholesterol Cal: 41 mg/dL — ABNORMAL HIGH (ref 5–40)

## 2020-03-02 LAB — LDL CHOLESTEROL, DIRECT: LDL Direct: 88 mg/dL (ref 0–99)

## 2020-03-03 NOTE — Telephone Encounter (Signed)
Lipid clinic visit scheduled 03/04/20 @ 0800

## 2020-03-04 ENCOUNTER — Other Ambulatory Visit: Payer: Self-pay

## 2020-03-04 ENCOUNTER — Encounter: Payer: Self-pay | Admitting: Internal Medicine

## 2020-03-04 ENCOUNTER — Ambulatory Visit: Payer: Medicare HMO | Admitting: Internal Medicine

## 2020-03-04 VITALS — BP 138/76 | HR 73 | Temp 97.0°F | Ht 67.5 in | Wt 193.2 lb

## 2020-03-04 DIAGNOSIS — I2510 Atherosclerotic heart disease of native coronary artery without angina pectoris: Secondary | ICD-10-CM

## 2020-03-04 DIAGNOSIS — E1169 Type 2 diabetes mellitus with other specified complication: Secondary | ICD-10-CM | POA: Diagnosis not present

## 2020-03-04 DIAGNOSIS — E785 Hyperlipidemia, unspecified: Secondary | ICD-10-CM | POA: Diagnosis not present

## 2020-03-04 DIAGNOSIS — E781 Pure hyperglyceridemia: Secondary | ICD-10-CM

## 2020-03-04 MED ORDER — ROSUVASTATIN CALCIUM 20 MG PO TABS: 20.0000 mg | ORAL_TABLET | Freq: Every day | ORAL | 3 refills | Status: DC

## 2020-03-04 NOTE — Patient Instructions (Signed)
Medication Instructions:  Your physician has recommended you make the following change in your medication: INCREASE crestor to 20mg  daily (you can take 2 of your 10mg  tablets until you run out - new prescription sent to pharmacy)  *If you need a refill on your cardiac medications before your next appointment, please call your pharmacy*   Lab Work: LIPID PANEL in 12 months - prior to next visit   If you have labs (blood work) drawn today and your tests are completely normal, you will receive your results only by: Marland Kitchen MyChart Message (if you have MyChart) OR . A paper copy in the mail If you have any lab test that is abnormal or we need to change your treatment, we will call you to review the results.   Testing/Procedures: NONE   Follow-Up: At [facility], you and your health needs are our priority.  As part of our continuing mission to provide you with exceptional heart care, we have created designated Provider Care Teams.  These Care Teams include your primary Cardiologist (physician) and Advanced Practice Providers (APPs -  Physician Assistants and Nurse Practitioners) who all work together to provide you with the care you need, when you need it.  We recommend signing up for the patient portal called "MyChart".  Sign up information is provided on this After Visit Summary.  MyChart is used to connect with patients for Virtual Visits (Telemedicine).  Patients are able to view lab/test results, encounter notes, upcoming appointments, etc.  Non-urgent messages can be sent to your provider as well.   To learn more about what you can do with MyChart, go to [email or web address].    Your next appointment:   12 month(s)  The format for your next appointment:   Either In Person or Virtual  Provider:   [name] Mali Hilty, MD   Other Instructions

## 2020-03-04 NOTE — Progress Notes (Signed)
OFFICE CONSULT NOTE  Chief Complaint: Follow-up dyslipidemia  Primary Care Physician: Baruch Gouty, FNP  HPI:  Timothy [name] is a 79 y.o. male who is being seen today for the evaluation of dyslipidemia at the request of [name], MD. This is a pleasant 79 year old male patient of Dr. Percival Spanish who sees Dr. Lynnette Caffey his primary care provider.  He is referred for evaluation and management of dyslipidemia.  He has a strong history of coronary artery disease with multiple heart catheterizations and prior coronary intervention, dating back to 24.  He also has chronic kidney disease, hypertension and type 2 diabetes.  He had significant dyslipidemia with primary elevated LDL, triglycerides and low HDL less than 20.  This is a typical diabetic or metabolic dyslipidemia.  Recent labs were 5 months ago indicating a total cholesterol 147, triglycerides 510, HDL 18 and LDL was not calculated.  He also had a lipoprotein NMR 2 years ago which showed LDL particle number of 1229 and triglycerides again over 500 with a high small LDL particle number of 1086.  Timothy [name] has a history of intolerance to atorvastatin which caused hot flashes.  He is currently on rosuvastatin 10 mg but has not tried higher doses.  He also is written to take fenofibrate 135 mg daily and Vascepa 2 g twice daily.  After much discussion it seems that he is not regularly taking the Vascepa.  He said initially was that they are larger capsules and did not fit in his pillbox therefore he has a separate container but often forgets to take the medication.  Review of filled prescriptions show that he did not get a scription filled for Vascepa since 2017.  We do know that he was getting some samples from his primary care provider however the suggest that compliance with the medication is not where it needs to be.  He does report somewhat of an atherogenic diet and denies any routine alcohol use.  With regards to A1c, this was most recently  6.8, from 6.110 months ago.  01/08/2019  Timothy [name] is seen today in follow-up.  He reports compliance with Vascepa 2 g twice daily.  He says he may have only missed about 5 doses over the past several months.  I suspect this will make a big difference in his numbers however unfortunately he did not have his fasting lipid profile obtained.  Also he is nonfasting today.  This could significantly affect his lipid profile.  We will need to repeat the lipids as previously recommended.  Overall he is tolerating medication.  03/04/2020  Timothy [name] returns for annual follow-up.  I am pleased to report his lipids are even better.  His total cholesterol now is 148, triglycerides 247 (down from over 500), HDL 25 and LDL 82.  There is been a slight uptake in his LDL cholesterol.  He is on 10 mg of rosuvastatin.  PMHx:  Past Medical History:  Diagnosis Date  . 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 BCC (basal cell carcinoma) 11/15/2004   LEFT CHEST- TX CURET X 3, 5FU  . BCC (basal cell carcinoma) 11/15/2004   RIGHT EAR- TX CURET X 3, EXCISION  . BCC (basal cell carcinoma) 08/13/2005   RIGHT EAR- TX MOHS  . BCC (basal cell carcinoma) 04/01/2008   SUPERFICIAL- UPPER LEFT BACK- TX CURET X 3, 5FU  . BCC (  basal cell carcinoma) 12/23/2012   ULCERATED- ABOVE RIGHT LIP- TX MOHS  . Cataract   . Chronic kidney disease   . Colon polyp   . Coronary artery disease    (left main normal, LAD 25-30% stenosis, distal 30-40% stenosis, circumflex obtuse marginal 50% stenosis,  right coronary artery dominant with long 75% followed by mid 80%  stenosis followed by 90% stenosis at the ostium of the PDA.  He had  stenting of the PDA by Dr. [name].  This was a Cypher stent.   First angioplasty was 1994, Cath 2011 100% RCA ).      . Diabetes mellitus without complication ([place])   . Essential hypertension, benign   .  Glaucoma   . Hyperlipidemia   . Hyperplasia of prostate   . 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 Melanoma ([place]) 12/23/2012   IN SITU- LEFT SIDEBURN-TX MOHS  . Melanoma ([place]) 02/27/2017   LEFT CHEEK- TX MOHS  . Myocardial infarction ([place])    per pt/ had mild heart attacks/has 3 stents  . OSA (obstructive sleep apnea) 08/17/2016  . 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   LEFT FOREHEAD-TX CURET X 3, 5FU  . SCCA (squamous cell carcinoma) of skin 04/01/2008   RIGHT HAND- TX CURET X 3, 5FU  . 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   LEFT SIDE OF FACE- TX CURET AFTER BIOPSY  . Sleep apnea    no c-pap    Past Surgical History:  Procedure Laterality Date  . APPENDECTOMY    . CERVICAL SPINE SURGERY    . CHOLECYSTECTOMY    . COLONOSCOPY  2004   multiple since  . CORONARY ANGIOPLASTY WITH STENT PLACEMENT     3 sents  . EYE SURGERY Bilateral    cataracts  . HERNIA REPAIR Bilateral   . SKIN CANCER EXCISION  03/17/2017    FAMHx:  Family History  Problem Relation Age of Onset  . Cancer Mother        unsure of type  . Glaucoma Mother   . Heart disease Father   . Coronary artery disease Father   . Cancer Sister 57       granlocytosis (Wergerner's)  . Colon cancer Brother 20  . Nephritis Sister        20's  . Cancer Brother        lung     SOCHx:   reports that he quit smoking about 28 years ago. His smoking use included cigarettes. He has a 50.00 pack-year smoking history. He has never used smokeless tobacco. He reports current alcohol use. He reports that he does not use drugs.  ALLERGIES:  Allergies  Allergen Reactions  . Hydrocodone-Acetaminophen Other (See  Comments)    Makes pt. Feel loopy    ROS: Pertinent items noted in HPI and remainder of comprehensive ROS otherwise negative.  HOME MEDS: Current Outpatient Medications on File Prior to Visit  Medication Sig Dispense Refill  . amLODipine (NORVASC) 10 MG tablet Take 1 tablet (10 mg total) by mouth daily. (Needs to be seen before next refill) 30 tablet 0  . aspirin 81 MG EC tablet Take 81 mg  by mouth daily.      . carvedilol (COREG) 12.5 MG tablet TAKE  (1)  TABLET TWICE A DAY WITH MEALS (BREAKFAST AND SUPPER) (Needs to be seen before next refill) 60 tablet 0  . Choline Fenofibrate (FENOFIBRIC ACID) 135 MG CPDR TAKE (1) CAPSULE DAILY 90 capsule 0  . esomeprazole (NEXIUM) 40 MG capsule TAKE (1) CAPSULE DAILY 90 capsule 0  . furosemide (LASIX) 20 MG tablet Take 1/2 tablet (10 mg total) by mouth daily. 90 tablet 1  . glimepiride (AMARYL) 4 MG tablet TAKE (1) TABLET TWICE A DAY.(Needs to be seen before next refill) 60 tablet 0  . imiquimod (ALDARA) 5 % cream APPLY TO RIGHT EAR ONBMONDAY, WEDNESDAY &pFRIDAY FOR 6 WEEKS OR UNTIL BRISK REACTION    . latanoprost (XALATAN) 0.005 % ophthalmic solution Place 1 drop into both eyes at bedtime.    . nitroGLYCERIN (NITROSTAT) 0.4 MG SL tablet Place 1 tablet (0.4 mg total) under the tongue every 5 (five) minutes as needed. 25 tablet 3  . Probiotic Product (PROBIOTIC DAILY PO) Take 1 application by mouth daily at 12 noon.    . rosuvastatin (CRESTOR) 10 MG tablet TAKE 1 TABLET DAILY 90 tablet 0  . sitaGLIPtin (JANUVIA) 25 MG tablet Take 1 tablet (25 mg total) by mouth daily. For diabetes 90 tablet 3  . tamsulosin (FLOMAX) 0.4 MG CAPS capsule TAKE (1) CAPSULE DAILY(Needs to be seen before next refill) 30 capsule 0  . enalapril (VASOTEC) 2.5 MG tablet Take 1 tablet (2.5 mg total) by mouth daily for 30 days. 90 tablet 3  . Icosapent Ethyl (VASCEPA) 1 g CAPS Take 2 capsules (2 g total) by mouth 2 (two) times daily. 360 capsule 1   No current facility-administered  medications on file prior to visit.    LABS/IMAGING: No results found for this or any previous visit (from the past 48 hour(s)). No results found.  LIPID PANEL:    Component Value Date/Time   CHOL 148 03/01/2020 0000   TRIG 247 (H) 03/01/2020 0000   TRIG 538 (H) 09/22/2016 0806   HDL 25 (L) 03/01/2020 0000   HDL 15 (L) 09/22/2016 0806   CHOLHDL 5.9 (H) 03/01/2020 0000   CHOLHDL 4.9 04/08/2013 0827   VLDL 55 (H) 04/08/2013 0827   LDLCALC 82 03/01/2020 0000   LDLCALC 48 07/16/2014 0818   LDLDIRECT 88 03/01/2020 0000    WEIGHTS: Wt Readings from Last 3 Encounters:  03/04/20 193 lb 3.2 oz (87.6 kg)  05/26/19 186 lb (84.4 kg)  04/15/19 188 lb (85.3 kg)    VITALS: BP 138/76   Pulse 73   Temp (!) 97 F (36.1 C)   Ht 5' 7.5" (1.715 m)   Wt 193 lb 3.2 oz (87.6 kg)   SpO2 94%   BMI 29.81 kg/m   EXAM: Deferred  EKG: Deferred  ASSESSMENT: 1. Coronary artery disease status post prior PCI 2. Mixed dyslipidemia (elevated LDL and triglycerides, low HDL)  PLAN: 1.   Timothy [name] reports strict compliance to his medications.  This is evident by improvement in his lipids.  His triglycerides have reduced over 50% from previous values.  LDL still remains above target although was better previously.  There is been about 7 pound weight gain which she will need to work on as far as more activity.  He says he still working and is very active but does not do sustained aerobic activity.  This should also help improve his LDL.  I did advise a  small increase in his Crestor from 10 to 20 mg and continue his fenofibrate and Vascepa.  Plan repeat lipids and follow-up with me in 1 year.  Pixie Casino, MD, [facility], [place] Director of the Advanced Lipid Disorders &  Cardiovascular [facility] Reduction Clinic Diplomate of the American Board of Clinical Lipidology Attending Cardiologist  Direct Dial: [phone]  Fax: [phone]  Website:   [email or web address] [email or web address] [name] 03/04/2020, 8:08 AM

## 2020-03-07 ENCOUNTER — Other Ambulatory Visit: Payer: Self-pay | Admitting: Family Medicine

## 2020-03-07 DIAGNOSIS — E1159 Type 2 diabetes mellitus with other circulatory complications: Secondary | ICD-10-CM

## 2020-03-07 DIAGNOSIS — N400 Enlarged prostate without lower urinary tract symptoms: Secondary | ICD-10-CM

## 2020-03-07 DIAGNOSIS — E1121 Type 2 diabetes mellitus with diabetic nephropathy: Secondary | ICD-10-CM

## 2020-03-07 DIAGNOSIS — I152 Hypertension secondary to endocrine disorders: Secondary | ICD-10-CM

## 2020-03-10 ENCOUNTER — Telehealth: Payer: Self-pay

## 2020-03-10 NOTE — Telephone Encounter (Signed)
Fax received from the [facility] [place] that patient's appointment is on 03/17/2020 with [place] at 7:30am.

## 2020-03-17 HISTORY — PX: SKIN CANCER EXCISION: SHX779

## 2020-03-23 ENCOUNTER — Other Ambulatory Visit: Payer: Self-pay

## 2020-03-23 ENCOUNTER — Ambulatory Visit: Payer: Medicare HMO | Admitting: Family Medicine

## 2020-03-23 ENCOUNTER — Encounter: Payer: Self-pay | Admitting: Family Medicine

## 2020-03-23 VITALS — BP 120/70 | HR 80 | Temp 98.4°F | Ht 67.5 in | Wt 193.6 lb

## 2020-03-23 DIAGNOSIS — E1121 Type 2 diabetes mellitus with diabetic nephropathy: Secondary | ICD-10-CM

## 2020-03-23 DIAGNOSIS — E1169 Type 2 diabetes mellitus with other specified complication: Secondary | ICD-10-CM

## 2020-03-23 DIAGNOSIS — E1159 Type 2 diabetes mellitus with other circulatory complications: Secondary | ICD-10-CM | POA: Diagnosis not present

## 2020-03-23 DIAGNOSIS — E1122 Type 2 diabetes mellitus with diabetic chronic kidney disease: Secondary | ICD-10-CM

## 2020-03-23 DIAGNOSIS — I10 Essential (primary) hypertension: Secondary | ICD-10-CM

## 2020-03-23 DIAGNOSIS — N183 Chronic kidney disease, stage 3 unspecified: Secondary | ICD-10-CM

## 2020-03-23 DIAGNOSIS — E785 Hyperlipidemia, unspecified: Secondary | ICD-10-CM

## 2020-03-23 DIAGNOSIS — I152 Hypertension secondary to endocrine disorders: Secondary | ICD-10-CM

## 2020-03-23 LAB — BAYER DCA HB A1C WAIVED: HB A1C (BAYER DCA - WAIVED): 7.7 % — ABNORMAL HIGH (ref <7.0)

## 2020-03-23 NOTE — Addendum Note (Signed)
Addended by: Baruch Gouty on: 03/23/2020 12:56 PM   Modules accepted: Level of Service

## 2020-03-23 NOTE — Progress Notes (Addendum)
Subjective:  Patient ID: Timothy [name], male    DOB: [birth year], 79 y.o.   MRN: [id number]  Patient Care Team: Janora Norlander, DO as PCP - General (Family Medicine) Clent Jacks, MD as Consulting Physician (Ophthalmology) Minus Breeding, MD as Consulting Physician (Cardiology) Chesley Mires, MD as Consulting Physician (Pulmonary Disease) Lavonna Monarch, MD as Consulting Physician (Dermatology)   Chief Complaint:  Medical Management of Chronic Issues, Diabetes, Hypertension, and Hyperlipidemia   HPI: Timothy [name] is a 79 y.o. male presenting on 03/23/2020 for Medical Management of Chronic Issues, Diabetes, Hypertension, and Hyperlipidemia  1. Type 2 diabetes mellitus with diabetic nephropathy, without long-term current use of insulin ([place]) Patient admits to more late night snacking. Compliant with medications without associated side effects. No polyuria, polydipsia, or polyphagia.   2. Hypertension associated with type 2 diabetes mellitus ([place]) Blood pressure well controlled. No chest pain, headaches, leg swelling, weakness, confusion, or headaches.   3. CKD stage 3 due to type 2 diabetes mellitus (HCC) No changes in urinary output. No fatigue, confusion, weakness, or edema.   4. Hyperlipidemia associated with type 2 diabetes mellitus ([place]) Compliant with medications without associated side effects. Does admit to not following a strict diet recently.      Relevant past medical, surgical, family, and social history reviewed and updated as indicated.  Allergies and medications reviewed and updated. Date reviewed: Chart in Epic.   Past Medical History:  Diagnosis Date  . 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 BCC (basal cell carcinoma) 11/15/2004   LEFT CHEST- TX CURET X 3, 5FU  . BCC (basal cell carcinoma) 11/15/2004   RIGHT EAR- TX CURET X 3, EXCISION  . BCC  (basal cell carcinoma) 08/13/2005   RIGHT EAR- TX MOHS  . BCC (basal cell carcinoma) 04/01/2008   SUPERFICIAL- UPPER LEFT BACK- TX CURET X 3, 5FU  . BCC (basal cell carcinoma) 12/23/2012   ULCERATED- ABOVE RIGHT LIP- TX MOHS  . Cataract   . Chronic kidney disease   . Colon polyp   . Coronary artery disease    (left main normal, LAD 25-30% stenosis, distal 30-40% stenosis, circumflex obtuse marginal 50% stenosis,  right coronary artery dominant with long 75% followed by mid 80%  stenosis followed by 90% stenosis at the ostium of the PDA.  He had  stenting of the PDA by Dr. [name].  This was a Cypher stent.   First angioplasty was 1994, Cath 2011 100% RCA ).      . Diabetes mellitus without complication ([place])   . Essential hypertension, benign   . Glaucoma   . Hyperlipidemia   . Hyperplasia of prostate   . 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 Melanoma ([place]) 12/23/2012   IN SITU- LEFT SIDEBURN-TX MOHS  . Melanoma ([place]) 02/27/2017   LEFT CHEEK- TX MOHS  . Myocardial infarction ([place])    per pt/ had mild heart attacks/has 3 stents  . OSA (obstructive sleep apnea) 08/17/2016  . 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   LEFT FOREHEAD-TX CURET  X 3, 5FU  . SCCA (squamous cell carcinoma) of skin 04/01/2008   RIGHT HAND- TX CURET X 3, 5FU  . 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   LEFT SIDE OF FACE- TX CURET AFTER BIOPSY  . Sleep apnea    no c-pap    Past Surgical History:  Procedure Laterality Date  . APPENDECTOMY    . CERVICAL SPINE SURGERY    . CHOLECYSTECTOMY    . COLONOSCOPY  2004   multiple since  . CORONARY ANGIOPLASTY WITH STENT PLACEMENT     3 sents  . EYE SURGERY  Bilateral    cataracts  . HERNIA REPAIR Bilateral   . SKIN CANCER EXCISION  03/17/2017  . SKIN CANCER EXCISION  03/17/2020   left side of face    Social History   Socioeconomic History  . Marital status: Married    Spouse name: Not on file  . Number of children: 2  . Years of education: Not on file  . Highest education level: Not on file  Occupational History  . Occupation: retired  Tobacco Use  . Smoking status: Former Smoker    Packs/day: 2.00    Years: 25.00    Pack years: 50.00    Types: Cigarettes    Quit date: 08/14/1991    Years since quitting: 28.6  . Smokeless tobacco: Never Used  Substance and Sexual Activity  . Alcohol use: Yes    Comment: rare  . Drug use: No  . Sexual activity: Yes  Other Topics Concern  . Not on file  Social History Narrative  . Not on file   Social Determinants of Health   Financial Resource Strain:   . Difficulty of Paying Living Expenses:   Food Insecurity:   . Worried About Charity fundraiser in the Last Year:   . Arboriculturist in the Last Year:   Transportation Needs:   . Film/video editor (Medical):   Marland Kitchen Lack of Transportation (Non-Medical):   Physical Activity:   . Days of Exercise per Week:   . Minutes of Exercise per Session:   Stress:   . Feeling of Stress :   Social Connections:   . Frequency of Communication with Friends and Family:   . Frequency of Social Gatherings with Friends and Family:   . Attends Religious Services:   . Active Member of Clubs or Organizations:   . Attends Archivist Meetings:   Marland Kitchen Marital Status:   Intimate Partner Violence:   . Fear of Current or Ex-Partner:   . Emotionally Abused:   Marland Kitchen Physically Abused:   . Sexually Abused:     Outpatient Encounter Medications as of 03/23/2020  Medication Sig  . amLODipine (NORVASC) 10 MG tablet Take 1 tablet (10 mg total) by mouth daily. (Needs to be seen before next refill)  . aspirin 81 MG EC tablet Take 81 mg by mouth daily.    .  AZOR 5-40 MG tablet   . carvedilol (COREG) 12.5 MG tablet TAKE  (1)  TABLET TWICE A DAY WITH MEALS (BREAKFAST AND SUPPER) (Needs to be seen before next refill)  . Choline Fenofibrate (FENOFIBRIC ACID) 135 MG CPDR TAKE (1) CAPSULE DAILY  . CRESTOR 10 MG tablet   . D 1000 25 MCG (1000 UT) capsule   . esomeprazole (NEXIUM) 40 MG capsule TAKE (1) CAPSULE DAILY  . furosemide (LASIX) 20 MG tablet Take 1/2 tablet (10 mg total) by mouth daily.  Marland Kitchen glimepiride (AMARYL)  4 MG tablet TAKE (1) TABLET TWICE A DAY.(Needs to be seen before next refill)  . hydrochlorothiazide (HYDRODIURIL) 25 MG tablet   . imiquimod (ALDARA) 5 % cream APPLY TO RIGHT EAR ONBMONDAY, WEDNESDAY &pFRIDAY FOR 6 WEEKS OR UNTIL BRISK REACTION  . KOMBIGLYZE XR 2.04-999 MG TB24   . latanoprost (XALATAN) 0.005 % ophthalmic solution Place 1 drop into both eyes at bedtime.  Marland Kitchen NIASPAN 1000 MG CR tablet   . nitroGLYCERIN (NITROSTAT) 0.4 MG SL tablet Place 1 tablet (0.4 mg total) under the tongue every 5 (five) minutes as needed.  . Probiotic Product (PROBIOTIC DAILY PO) Take 1 application by mouth daily at 12 noon.  . rosuvastatin (CRESTOR) 20 MG tablet Take 1 tablet (20 mg total) by mouth daily.  . sitaGLIPtin (JANUVIA) 25 MG tablet Take 1 tablet (25 mg total) by mouth daily. For diabetes  . tamsulosin (FLOMAX) 0.4 MG CAPS capsule TAKE (1) CAPSULE DAILY(Needs to be seen before next refill)  . testosterone cypionate (DEPOTESTOSTERONE CYPIONATE) 200 MG/ML injection   . enalapril (VASOTEC) 2.5 MG tablet Take 1 tablet (2.5 mg total) by mouth daily for 30 days.  [name] (VASCEPA) 1 g CAPS Take 2 capsules (2 g total) by mouth 2 (two) times daily.   No facility-administered encounter medications on file as of 03/23/2020.    Allergies  Allergen Reactions  . Hydrocodone-Acetaminophen Other (See Comments)    Makes pt. Feel loopy    Review of Systems  Constitutional: Negative for activity change, appetite change, chills, diaphoresis,  fatigue, fever and unexpected weight change.  HENT: Negative.   Eyes: Negative.   Respiratory: Negative for cough, chest tightness and shortness of breath.   Cardiovascular: Negative for chest pain, palpitations and leg swelling.  Gastrointestinal: Negative for abdominal pain, blood in stool, constipation, diarrhea, nausea and vomiting.  Endocrine: Negative.  Negative for polydipsia, polyphagia and polyuria.  Genitourinary: Negative for decreased urine volume, difficulty urinating, dysuria, frequency and urgency.  Musculoskeletal: Negative for arthralgias and myalgias.  Skin: Negative.   Allergic/Immunologic: Negative.   Neurological: Negative for dizziness, tremors, seizures, syncope, facial asymmetry, speech difficulty, weakness, light-headedness, numbness and headaches.  Hematological: Negative.   Psychiatric/Behavioral: Negative for confusion, hallucinations, sleep disturbance and suicidal ideas.  All other systems reviewed and are negative.       Objective:  BP 120/70   Pulse 80   Temp 98.4 F (36.9 C)   Ht 5' 7.5" (1.715 m)   Wt 193 lb 9.6 oz (87.8 kg)   SpO2 95%   BMI 29.87 kg/m    Wt Readings from Last 3 Encounters:  03/23/20 193 lb 9.6 oz (87.8 kg)  03/04/20 193 lb 3.2 oz (87.6 kg)  05/26/19 186 lb (84.4 kg)    Physical Exam Vitals and nursing note reviewed.  Constitutional:      General: He is not in acute distress.    Appearance: Normal appearance. He is well-developed and well-groomed. He is obese. He is not ill-appearing, toxic-appearing or diaphoretic.  HENT:     Head: Normocephalic and atraumatic.     Jaw: There is normal jaw occlusion.     Right Ear: Hearing normal.     Left Ear: Hearing normal.     Nose: Nose normal.     Mouth/Throat:     Lips: Pink.     Mouth: Mucous membranes are moist.     Pharynx: Oropharynx is clear. Uvula midline.  Eyes:     General: Lids are normal.  Extraocular Movements: Extraocular movements intact.      Conjunctiva/sclera: Conjunctivae normal.     Pupils: Pupils are equal, round, and reactive to light.  Neck:     Thyroid: No thyroid mass, thyromegaly or thyroid tenderness.     Vascular: No carotid bruit or JVD.     Trachea: Trachea and phonation normal.  Cardiovascular:     Rate and Rhythm: Normal rate and regular rhythm.     Chest Wall: PMI is not displaced.     Pulses: Normal pulses.          Dorsalis pedis pulses are 2+ on the right side and 2+ on the left side.       Posterior tibial pulses are 2+ on the right side and 2+ on the left side.     Heart sounds: Normal heart sounds. No murmur. No friction rub. No gallop.   Pulmonary:     Effort: Pulmonary effort is normal. No respiratory distress.     Breath sounds: Normal breath sounds. No wheezing.  Abdominal:     General: Bowel sounds are normal. There is no distension or abdominal bruit.     Palpations: Abdomen is soft. There is no hepatomegaly or splenomegaly.     Tenderness: There is no abdominal tenderness. There is no right CVA tenderness or left CVA tenderness.     Hernia: No hernia is present.  Musculoskeletal:        General: Normal range of motion.     Cervical back: Normal range of motion and neck supple.     Right lower leg: No edema.     Left lower leg: No edema.  Feet:     Right foot:     Protective Sensation: 10 sites tested. 10 sites sensed.     Skin integrity: Skin integrity normal.     Left foot:     Protective Sensation: 10 sites tested. 10 sites sensed.     Skin integrity: Skin integrity normal.  Lymphadenopathy:     Cervical: No cervical adenopathy.  Skin:    General: Skin is warm and dry.     Capillary Refill: Capillary refill takes less than 2 seconds.     Coloration: Skin is not cyanotic, jaundiced or pale.     Findings: No rash.  Neurological:     General: No focal deficit present.     Mental Status: He is alert and oriented to person, place, and time.     Cranial Nerves: Cranial nerves are  intact. No cranial nerve deficit.     Sensory: Sensation is intact. No sensory deficit.     Motor: Motor function is intact. No weakness.     Coordination: Coordination is intact. Coordination normal.     Gait: Gait is intact. Gait normal.     Deep Tendon Reflexes: Reflexes are normal and symmetric. Reflexes normal.  Psychiatric:        Attention and Perception: Attention and perception normal.        Mood and Affect: Mood and affect normal.        Speech: Speech normal.        Behavior: Behavior normal. Behavior is cooperative.        Thought Content: Thought content normal.        Cognition and Memory: Cognition and memory normal.        Judgment: Judgment normal.     Results for orders placed or performed in visit on 03/23/20  Bayer [place] Hb A1c Waived  Result Value Ref Range   HB A1C (BAYER DCA - WAIVED) 7.7 (H) <7.0 %       Pertinent labs & imaging results that were available during my care of the patient were reviewed by me and considered in my medical decision making.  Assessment & Plan:  Timothy [name] was seen today for medical management of chronic issues, diabetes, hypertension and hyperlipidemia.  Diagnoses and all orders for this visit:  Type 2 diabetes mellitus with diabetic nephropathy, without long-term current use of insulin ([place]) -     Bayer DCA Hb A1c Waived -     CBC with Differential/Platelet -     CMP14+EGFR -     Lipid panel -     Thyroid Panel With TSH A1C 7.7 today. Will not make medication changes but will make strict dietary changes. Will reassess in 3 months. Encouraged patient to monitor carb intake.  Hypertension associated with type 2 diabetes mellitus (HCC) BP well controlled. Changes were not made in regimen today. Goal BP is 130/80. Pt aware to report any persistent high or low readings. DASH diet and exercise encouraged. Exercise at least 150 minutes per week and increase as tolerated. Goal BMI > 25. Stress management encouraged. Avoid nicotine and  tobacco product use. Avoid excessive alcohol and NSAID's. Avoid more than 2000 mg of sodium daily. Medications as prescribed. Follow up as scheduled.  -     CBC with Differential/Platelet -     CMP14+EGFR -     Thyroid Panel With TSH  CKD stage 3 due to type 2 diabetes mellitus ([place]) Has been stable. Labs pending.  -     CBC with Differential/Platelet -     CMP14+EGFR  Hyperlipidemia associated with type 2 diabetes mellitus ([place]) Diet encouraged - increase intake of fresh fruits and vegetables, increase intake of lean proteins. Bake, broil, or grill foods. Avoid fried, greasy, and fatty foods. Avoid fast foods. Increase intake of fiber-rich whole grains. Exercise encouraged - at least 150 minutes per week and advance as tolerated.  Goal BMI < 25. Continue medications as prescribed. Follow up in 3-6 months as discussed.  -     Lipid panel     Continue all other maintenance medications.  Follow up plan: Return in about 3 months (around 06/22/2020), or if symptoms worsen or fail to improve, for DM.    Continue healthy lifestyle choices, including diet (rich in fruits, vegetables, and lean proteins, and low in salt and simple carbohydrates) and exercise (at least 30 minutes of moderate physical activity daily).  Educational handout given for heart healthy diet.  The above assessment and management plan was discussed with the patient. The patient verbalized understanding of and has agreed to the management plan. Patient is aware to call the clinic if they develop any new symptoms or if symptoms persist or worsen. Patient is aware when to return to the clinic for a follow-up visit. Patient educated on when it is appropriate to go to the emergency department.   Robynn Pane, RN, FNP student  I personally was present during the history, physical exam, and medical decision-making activities of this service and have verified that the service and findings are accurately documented in the nurse  practitioner student's note.  Monia Pouch, FNP-C [facility] [address] [place], [place] [address] [phone]

## 2020-03-24 LAB — CMP14+EGFR
ALT: 21 IU/L (ref 0–44)
AST: 28 IU/L (ref 0–40)
Albumin/Globulin Ratio: 1.7 (ref 1.2–2.2)
Albumin: 4.2 g/dL (ref 3.7–4.7)
Alkaline Phosphatase: 63 IU/L (ref 39–117)
BUN/Creatinine Ratio: 11 (ref 10–24)
BUN: 17 mg/dL (ref 8–27)
Bilirubin Total: 0.3 mg/dL (ref 0.0–1.2)
CO2: 20 mmol/L (ref 20–29)
Calcium: 9.1 mg/dL (ref 8.6–10.2)
Chloride: 104 mmol/L (ref 96–106)
Creatinine, Ser: 1.53 mg/dL — ABNORMAL HIGH (ref 0.76–1.27)
GFR calc Af Amer: 50 mL/min/{1.73_m2} — ABNORMAL LOW (ref >59)
GFR calc non Af Amer: 43 mL/min/{1.73_m2} — ABNORMAL LOW (ref >59)
Globulin, Total: 2.5 g/dL (ref 1.5–4.5)
Glucose: 175 mg/dL — ABNORMAL HIGH (ref 65–99)
Potassium: 4 mmol/L (ref 3.5–5.2)
Sodium: 140 mmol/L (ref 134–144)
Total Protein: 6.7 g/dL (ref 6.0–8.5)

## 2020-03-24 LAB — CBC WITH DIFFERENTIAL/PLATELET
Basophils Absolute: 0.1 10*3/uL (ref 0.0–0.2)
Basos: 1 %
EOS (ABSOLUTE): 0.1 10*3/uL (ref 0.0–0.4)
Eos: 2 %
Hematocrit: 37.7 % (ref 37.5–51.0)
Hemoglobin: 12.8 g/dL — ABNORMAL LOW (ref 13.0–17.7)
Immature Grans (Abs): 0 10*3/uL (ref 0.0–0.1)
Immature Granulocytes: 1 %
Lymphocytes Absolute: 2.2 10*3/uL (ref 0.7–3.1)
Lymphs: 36 %
MCH: 29.8 pg (ref 26.6–33.0)
MCHC: 34 g/dL (ref 31.5–35.7)
MCV: 88 fL (ref 79–97)
Monocytes Absolute: 0.5 10*3/uL (ref 0.1–0.9)
Monocytes: 8 %
Neutrophils Absolute: 3.2 10*3/uL (ref 1.4–7.0)
Neutrophils: 52 %
Platelets: 178 10*3/uL (ref 150–450)
RBC: 4.3 x10E6/uL (ref 4.14–5.80)
RDW: 13.6 % (ref 11.6–15.4)
WBC: 6 10*3/uL (ref 3.4–10.8)

## 2020-03-24 LAB — THYROID PANEL WITH TSH
Free Thyroxine Index: 3.1 (ref 1.2–4.9)
T3 Uptake Ratio: 34 % (ref 24–39)
T4, Total: 9.1 ug/dL (ref 4.5–12.0)
TSH: 3.03 u[IU]/mL (ref 0.450–4.500)

## 2020-03-24 LAB — LIPID PANEL
Chol/HDL Ratio: 7.2 ratio — ABNORMAL HIGH (ref 0.0–5.0)
Cholesterol, Total: 137 mg/dL (ref 100–199)
HDL: 19 mg/dL — ABNORMAL LOW (ref >39)
LDL Chol Calc (NIH): 36 mg/dL (ref 0–99)
Triglycerides: 589 mg/dL (ref 0–149)
VLDL Cholesterol Cal: 82 mg/dL — ABNORMAL HIGH (ref 5–40)

## 2020-03-24 NOTE — Progress Notes (Signed)
Hgb 0.2 low, has improved.  Glucose high at 175. Limit intake of sugary foods and drinks.  Renal function remains declined but stable. On ARB. Will continue to monitor.  Liver function normal.  Thyroid function normal.  Triglycerides are very elevated at 589. Continue medications. Diet is essential to help lower this number. Limit intake of fried, greasy, fatty, and fast foods. Will recheck in 3-6 months. Make sure to watch diet and exercise at least 5 times per week.

## 2020-04-05 ENCOUNTER — Other Ambulatory Visit: Payer: Self-pay | Admitting: Family Medicine

## 2020-04-05 DIAGNOSIS — I152 Hypertension secondary to endocrine disorders: Secondary | ICD-10-CM

## 2020-04-05 DIAGNOSIS — G4730 Sleep apnea, unspecified: Secondary | ICD-10-CM | POA: Insufficient documentation

## 2020-04-05 DIAGNOSIS — N1831 Chronic kidney disease, stage 3a: Secondary | ICD-10-CM | POA: Insufficient documentation

## 2020-04-05 DIAGNOSIS — E1159 Type 2 diabetes mellitus with other circulatory complications: Secondary | ICD-10-CM

## 2020-04-05 NOTE — Progress Notes (Signed)
Cardiology Office Note   Date:  04/06/2020   ID:  Timothy [name], DOB [birth year], MRN [id number]  PCP:  Timothy Norlander, DO  Cardiologist:   Timothy Breeding, MD   Chief Complaint  Patient presents with  . Coronary Artery Disease      History of Present Illness: Timothy [name] is a 79 y.o. male who presents for follow up of CAD.  Since I last saw him he has done okay.  He denies any cardiovascular symptoms.  He is not been having any chest pressure, neck or arm discomfort.  He has been having no new shortness of breath, PND or orthopnea.  He works in his [employer].  He denies any cardiovascular symptoms.  He has seen Dr. Debara [name] and has had management of his dyslipidemia with his significantly elevated triglycerides.  He had a very nice response to therapy.  However, I do note that his triglycerides which had been been in the 200s and now greater than 500 again.  Has had some dietary indiscretion as we reviewed today that might explain this.    Past Medical History:  Diagnosis Date  . 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 BCC (basal cell carcinoma) 11/15/2004   LEFT CHEST- TX CURET X 3, 5FU  . BCC (basal cell carcinoma) 11/15/2004   RIGHT EAR- TX CURET X 3, EXCISION  . BCC (basal cell carcinoma) 08/13/2005   RIGHT EAR- TX MOHS  . BCC (basal cell carcinoma) 04/01/2008   SUPERFICIAL- UPPER LEFT BACK- TX CURET X 3, 5FU  . BCC (basal cell carcinoma) 12/23/2012   ULCERATED- ABOVE RIGHT LIP- TX MOHS  . Cataract   . Chronic kidney disease   . Colon polyp   . Coronary artery disease    (left main normal, LAD 25-30% stenosis, distal 30-40% stenosis, circumflex obtuse marginal 50% stenosis,  right coronary artery dominant with long 75% followed by mid 80%  stenosis followed by 90% stenosis at the ostium of the PDA.  He had  stenting of the PDA by Dr. Lia [name].  This was a  Cypher stent.   First angioplasty was 1994, Cath 2011 100% RCA ).      . Diabetes mellitus without complication ([place])   . Essential hypertension, benign   . Glaucoma   . Hyperlipidemia   . Hyperplasia of prostate   . 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 Melanoma (Timothy [name]) 12/23/2012   IN SITU- LEFT SIDEBURN-TX MOHS  . Melanoma (Timothy [name]) 02/27/2017   LEFT CHEEK- TX MOHS  . Myocardial infarction (Timothy [name])    per pt/ had mild heart attacks/has 3 stents  . OSA (obstructive sleep apnea) 08/17/2016  . 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   LEFT FOREHEAD-TX CURET X 3, 5FU  . SCCA (squamous cell carcinoma) of skin 04/01/2008   RIGHT HAND- TX CURET X 3, 5FU  . 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   LEFT SIDE OF FACE- TX CURET AFTER BIOPSY  . Sleep apnea    no c-pap    Past Surgical  History:  Procedure Laterality Date  . APPENDECTOMY    . CERVICAL SPINE SURGERY    . CHOLECYSTECTOMY    . COLONOSCOPY  2004   multiple since  . CORONARY ANGIOPLASTY WITH STENT PLACEMENT     3 sents  . EYE SURGERY Bilateral    cataracts  . HERNIA REPAIR Bilateral   . SKIN CANCER EXCISION  03/17/2017  . SKIN CANCER EXCISION  03/17/2020   left side of face     Current Outpatient Medications  Medication Sig Dispense Refill  . amLODipine (NORVASC) 10 MG tablet TAKE 1 TABLET DAILY 90 tablet 0  . aspirin 81 MG EC tablet Take 81 mg by mouth daily.      . carvedilol (COREG) 12.5 MG tablet TAKE  (1)  TABLET TWICE A DAY WITH MEALS (BREAKFAST AND SUPPER) (Needs to be seen before next refill) 60 tablet 0  . Choline Fenofibrate (FENOFIBRIC ACID) 135 MG CPDR TAKE (1) CAPSULE DAILY 90 capsule 0  . enalapril  (VASOTEC) 2.5 MG tablet Take 1 tablet (2.5 mg total) by mouth daily for 30 days. 90 tablet 3  . esomeprazole (NEXIUM) 40 MG capsule TAKE (1) CAPSULE DAILY 90 capsule 0  . furosemide (LASIX) 20 MG tablet Take 1/2 tablet (10 mg total) by mouth daily. 90 tablet 1  . glimepiride (AMARYL) 4 MG tablet TAKE (1) TABLET TWICE A DAY.(Needs to be seen before next refill) 60 tablet 0  . hydrochlorothiazide (HYDRODIURIL) 25 MG tablet Take 25 mg by mouth daily.     [name] (VASCEPA) 1 g CAPS Take 2 capsules (2 g total) by mouth 2 (two) times daily. 360 capsule 1  . KOMBIGLYZE XR 2.04-999 MG TB24 Take by mouth daily.     Marland Kitchen latanoprost (XALATAN) 0.005 % ophthalmic solution Place 1 drop into both eyes at bedtime.    Marland Kitchen NIASPAN 1000 MG CR tablet Take 1,000 mg by mouth daily.     . nitroGLYCERIN (NITROSTAT) 0.4 MG SL tablet Place 1 tablet (0.4 mg total) under the tongue every 5 (five) minutes as needed. 25 tablet 3  . rosuvastatin (CRESTOR) 20 MG tablet Take 1 tablet (20 mg total) by mouth daily. 90 tablet 3  . sitaGLIPtin (JANUVIA) 25 MG tablet Take 1 tablet (25 mg total) by mouth daily. For diabetes 90 tablet 3  . tamsulosin (FLOMAX) 0.4 MG CAPS capsule TAKE (1) CAPSULE DAILY(Needs to be seen before next refill) 30 capsule 0  . AZOR 5-40 MG tablet      No current facility-administered medications for this visit.    Allergies:   Hydrocodone-acetaminophen     ROS:  Please see the history of present illness.   Otherwise, review of systems are positive for none.   All other systems are reviewed and negative.    PHYSICAL EXAM: VS:  BP 112/62   Pulse 79   Ht 5' 7.5" (1.715 m)   Wt 190 lb (86.2 kg)   BMI 29.32 kg/m  , BMI Body mass index is 29.32 kg/m. GENERAL:  Well appearing NECK:  No jugular venous distention, waveform within normal limits, carotid upstroke brisk and symmetric, no bruits, no thyromegaly LUNGS:  Clear to auscultation bilaterally CHEST:  Unremarkable HEART:  PMI not displaced  or sustained,S1 and S2 within normal limits, no S3, no S4, no clicks, no rubs, no murmurs ABD:  Flat, positive bowel sounds normal in frequency in pitch, no bruits, no rebound, no guarding, no midline pulsatile mass, no hepatomegaly, no splenomegaly EXT:  2 plus pulses throughout, no edema, no cyanosis no clubbing    EKG:  EKG is ordered today. The ekg ordered today demonstrates sinus rhythm, rate 79, axis within normal limits, intervals within normal limits, early transition lead V2   Recent Labs: 03/23/2020: ALT 21; BUN 17; Creatinine, Ser 1.53; Hemoglobin 12.8; Platelets 178; Potassium 4.0; Sodium 140; TSH 3.030    Lipid Panel    Component Value Date/Time   CHOL 137 03/23/2020 1229   TRIG 589 (HH) 03/23/2020 1229   TRIG 538 (H) 09/22/2016 0806   HDL 19 (L) 03/23/2020 1229   HDL 15 (L) 09/22/2016 0806   CHOLHDL 7.2 (H) 03/23/2020 1229   CHOLHDL 4.9 04/08/2013 0827   VLDL 55 (H) 04/08/2013 0827   LDLCALC 36 03/23/2020 1229   LDLCALC 48 07/16/2014 0818   LDLDIRECT 88 03/01/2020 0000      Wt Readings from Last 3 Encounters:  04/06/20 190 lb (86.2 kg)  03/23/20 193 lb 9.6 oz (87.8 kg)  03/04/20 193 lb 3.2 oz (87.6 kg)      Other studies Reviewed: Additional studies/ records that were reviewed today include: Labs. Review of the above records demonstrates:  Please see elsewhere in the note.     ASSESSMENT AND PLAN:  CAD - The patient has no new sypmtoms.  No further cardiovascular testing is indicated.  We will continue with aggressive risk reduction and meds as listed.  HTN -  The blood pressure is at target.  No change in therapy.  DYSLIPIDEMIA - LDL was 36 but his triglycerides were back up to 589.  This is probably dietary indiscretion.  We had a discussion about this.  I also sent a message to Dr. Debara [name] to see if he would suggest any change in therapy.   SLEEP APNEA - He could not afford CPAP.    No change in therapy.  CKD STAGE III- Creatinine was stable  and lower than previous at 1.53.  DM - This is followed by Timothy Norlander, DO.  I did counsel him today because his hemoglobin A1c is 7.7 and it had been 6.8.  Again diet is probably related.  He should have follow-up with management of this and consideration of an SGLT2 inhibitor or GLP-1 receptor antagonist if he does not get below 7.    COVID-19 Education: He has had his vaccinations.  Current medicines are reviewed at length with the patient today.  The patient does not have concerns regarding medicines.  The following changes have been made:  no change  Labs/ tests ordered today include:   Orders Placed This Encounter  Procedures  . EKG 12-Lead     Disposition:   FU with me in one year.     Signed, Timothy Breeding, MD  04/06/2020 12:51 PM    [facility]

## 2020-04-06 ENCOUNTER — Encounter: Payer: Self-pay | Admitting: Cardiology

## 2020-04-06 ENCOUNTER — Other Ambulatory Visit: Payer: Self-pay

## 2020-04-06 ENCOUNTER — Ambulatory Visit: Payer: Medicare HMO | Admitting: Cardiology

## 2020-04-06 VITALS — BP 112/62 | HR 79 | Ht 67.5 in | Wt 190.0 lb

## 2020-04-06 DIAGNOSIS — N1831 Chronic kidney disease, stage 3a: Secondary | ICD-10-CM

## 2020-04-06 DIAGNOSIS — E785 Hyperlipidemia, unspecified: Secondary | ICD-10-CM | POA: Diagnosis not present

## 2020-04-06 DIAGNOSIS — I10 Essential (primary) hypertension: Secondary | ICD-10-CM

## 2020-04-06 DIAGNOSIS — E118 Type 2 diabetes mellitus with unspecified complications: Secondary | ICD-10-CM

## 2020-04-06 DIAGNOSIS — I2510 Atherosclerotic heart disease of native coronary artery without angina pectoris: Secondary | ICD-10-CM

## 2020-04-06 DIAGNOSIS — G4730 Sleep apnea, unspecified: Secondary | ICD-10-CM

## 2020-04-06 DIAGNOSIS — Z7189 Other specified counseling: Secondary | ICD-10-CM

## 2020-04-06 NOTE — Patient Instructions (Signed)
Medication Instructions:  The current medical regimen is effective;  continue present plan and medications.  *If you need a refill on your cardiac medications before your next appointment, please call your pharmacy*  Follow-Up: At [facility], you and your health needs are our priority.  As part of our continuing mission to provide you with exceptional heart care, we have created designated Provider Care Teams.  These Care Teams include your primary Cardiologist (physician) and Advanced Practice Providers (APPs -  Physician Assistants and Nurse Practitioners) who all work together to provide you with the care you need, when you need it.  We recommend signing up for the patient portal called "MyChart".  Sign up information is provided on this After Visit Summary.  MyChart is used to connect with patients for Virtual Visits (Telemedicine).  Patients are able to view lab/test results, encounter notes, upcoming appointments, etc.  Non-urgent messages can be sent to your provider as well.   To learn more about what you can do with MyChart, go to [email or web address].    Your next appointment:   12 month(s)  The format for your next appointment:   In Person  Provider:   James Hochrein, MD   Thank you for choosing [facility]!!     

## 2020-04-13 ENCOUNTER — Encounter: Payer: Self-pay | Admitting: *Deleted

## 2020-05-23 ENCOUNTER — Other Ambulatory Visit: Payer: Self-pay | Admitting: *Deleted

## 2020-05-23 DIAGNOSIS — E1169 Type 2 diabetes mellitus with other specified complication: Secondary | ICD-10-CM

## 2020-05-23 DIAGNOSIS — E1159 Type 2 diabetes mellitus with other circulatory complications: Secondary | ICD-10-CM

## 2020-05-23 DIAGNOSIS — I152 Hypertension secondary to endocrine disorders: Secondary | ICD-10-CM

## 2020-05-23 DIAGNOSIS — E1121 Type 2 diabetes mellitus with diabetic nephropathy: Secondary | ICD-10-CM

## 2020-05-23 DIAGNOSIS — E785 Hyperlipidemia, unspecified: Secondary | ICD-10-CM

## 2020-05-23 MED ORDER — CARVEDILOL 12.5 MG PO TABS: ORAL_TABLET | ORAL | 0 refills | Status: DC

## 2020-05-23 MED ORDER — ICOSAPENT ETHYL 1 G PO CAPS: 2.0000 g | ORAL_CAPSULE | Freq: Two times a day (BID) | ORAL | 0 refills | Status: DC

## 2020-05-23 MED ORDER — GLIMEPIRIDE 4 MG PO TABS: ORAL_TABLET | ORAL | 0 refills | Status: DC

## 2020-05-30 LAB — HM DIABETES EYE EXAM

## 2020-06-01 ENCOUNTER — Other Ambulatory Visit: Payer: Self-pay | Admitting: *Deleted

## 2020-06-01 DIAGNOSIS — N400 Enlarged prostate without lower urinary tract symptoms: Secondary | ICD-10-CM

## 2020-06-01 MED ORDER — TAMSULOSIN HCL 0.4 MG PO CAPS: ORAL_CAPSULE | ORAL | 0 refills | Status: DC

## 2020-06-27 ENCOUNTER — Encounter: Payer: Self-pay | Admitting: Family Medicine

## 2020-06-27 ENCOUNTER — Ambulatory Visit: Payer: Medicare HMO | Admitting: Family Medicine

## 2020-06-27 ENCOUNTER — Other Ambulatory Visit: Payer: Self-pay | Admitting: Family Medicine

## 2020-06-27 ENCOUNTER — Other Ambulatory Visit: Payer: Self-pay

## 2020-06-27 VITALS — BP 133/66 | HR 81 | Temp 97.7°F | Ht 67.5 in | Wt 185.0 lb

## 2020-06-27 DIAGNOSIS — E1169 Type 2 diabetes mellitus with other specified complication: Secondary | ICD-10-CM

## 2020-06-27 DIAGNOSIS — N3943 Post-void dribbling: Secondary | ICD-10-CM

## 2020-06-27 DIAGNOSIS — I10 Essential (primary) hypertension: Secondary | ICD-10-CM

## 2020-06-27 DIAGNOSIS — E1122 Type 2 diabetes mellitus with diabetic chronic kidney disease: Secondary | ICD-10-CM

## 2020-06-27 DIAGNOSIS — E1159 Type 2 diabetes mellitus with other circulatory complications: Secondary | ICD-10-CM

## 2020-06-27 DIAGNOSIS — I152 Hypertension secondary to endocrine disorders: Secondary | ICD-10-CM

## 2020-06-27 DIAGNOSIS — N183 Chronic kidney disease, stage 3 unspecified: Secondary | ICD-10-CM

## 2020-06-27 DIAGNOSIS — Z1159 Encounter for screening for other viral diseases: Secondary | ICD-10-CM

## 2020-06-27 DIAGNOSIS — Z7689 Persons encountering health services in other specified circumstances: Secondary | ICD-10-CM | POA: Diagnosis not present

## 2020-06-27 DIAGNOSIS — E1121 Type 2 diabetes mellitus with diabetic nephropathy: Secondary | ICD-10-CM | POA: Diagnosis not present

## 2020-06-27 DIAGNOSIS — D696 Thrombocytopenia, unspecified: Secondary | ICD-10-CM

## 2020-06-27 DIAGNOSIS — E785 Hyperlipidemia, unspecified: Secondary | ICD-10-CM

## 2020-06-27 DIAGNOSIS — N401 Enlarged prostate with lower urinary tract symptoms: Secondary | ICD-10-CM

## 2020-06-27 LAB — BAYER DCA HB A1C WAIVED: HB A1C (BAYER DCA - WAIVED): 6.7 % (ref <7.0)

## 2020-06-27 MED ORDER — ENALAPRIL MALEATE 2.5 MG PO TABS: 2.5000 mg | ORAL_TABLET | Freq: Every day | ORAL | 3 refills | Status: DC

## 2020-06-27 MED ORDER — AMLODIPINE BESYLATE 10 MG PO TABS: 10.0000 mg | ORAL_TABLET | Freq: Every day | ORAL | 3 refills | Status: DC

## 2020-06-27 MED ORDER — SITAGLIPTIN PHOSPHATE 25 MG PO TABS: 25.0000 mg | ORAL_TABLET | Freq: Every day | ORAL | 3 refills | Status: DC

## 2020-06-27 MED ORDER — CARVEDILOL 12.5 MG PO TABS: ORAL_TABLET | ORAL | 3 refills | Status: DC

## 2020-06-27 MED ORDER — FUROSEMIDE 20 MG PO TABS: ORAL_TABLET | ORAL | 3 refills | Status: DC

## 2020-06-27 MED ORDER — GLIMEPIRIDE 4 MG PO TABS: 4.0000 mg | ORAL_TABLET | Freq: Two times a day (BID) | ORAL | 3 refills | Status: DC

## 2020-06-27 MED ORDER — TAMSULOSIN HCL 0.4 MG PO CAPS: 0.4000 mg | ORAL_CAPSULE | Freq: Every day | ORAL | 3 refills | Status: DC

## 2020-06-27 MED ORDER — ICOSAPENT ETHYL 1 G PO CAPS: 2.0000 g | ORAL_CAPSULE | Freq: Two times a day (BID) | ORAL | 3 refills | Status: DC

## 2020-06-27 NOTE — Patient Instructions (Signed)

## 2020-06-27 NOTE — Progress Notes (Signed)
Subjective: CC: Establish care, type 2 diabetes PCP: Timothy Norlander, DO HBZ:JIRCVEL Timothy [name] is a 79 y.o. male presenting to clinic today for:  1.  Type 2 Diabetes w/ CKD3b, HTN, HLD, CAD: History: No history of DKA, pancreatitis, foot ulcers.  He does have history of MI with 3 stents placed.  Sees Dr. Percival [name] yearly  Patient reports compliance with Amaryl, Januvia.  Not on Metformin.  He typically consumes a low-carb diet but admits that during the summertime when tomatoes, and he eats more bread.  He is compliant with his Coreg 12.5, fenofibrate, Vasotec 2.5, norvasc 10, Vascepa BID.  Last eye exam: UTD Last foot exam: UTD Last A1c:  Lab Results  Component Value Date   HGBA1C 6.7 06/27/2020   Nephropathy screen indicated?: on ACE-I Last flu, zoster and/or pneumovax:  Immunization History  Administered Date(s) Administered  . Influenza Whole 08/17/2010  . Influenza, High Dose Seasonal PF 09/21/2016, 09/25/2017, 09/24/2018  . Influenza,inj,Quad PF,6+ Mos 09/28/2013, 10/11/2014, 10/10/2015, 11/06/2019  . Moderna SARS-COVID-2 Vaccination 02/15/2020, 03/14/2020  . Pneumococcal Conjugate-13 10/11/2014  . Pneumococcal Polysaccharide-23 06/16/2008  . Td 02/15/2003, 09/20/2011  . Tdap 09/17/2011  . Zoster 05/18/2007    ROS: No chest pain, shortness of breath, lower extremity edema, neuropathic pain.  No visual disturbance.  2.  BPH Patient reports history of prostate issues that is relieved with Flomax.  Denies any hematuria, nocturia.  However, he does report urinary leakage.  He was previously seen in [place] but wishes to switch urologist if possible, as he did not feel that he connected well with his previous urologist.    ROS: Per HPI  Allergies  Allergen Reactions  . Hydrocodone-Acetaminophen Other (See Comments)    Makes pt. Feel loopy   Past Medical History:  Diagnosis Date  . 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 BCC  (basal cell carcinoma) 08/05/1997   RIGHT NASAL SIDEWALL- NO TX  . BCC (basal cell carcinoma) 11/15/2004   LEFT CHEST- TX CURET X 3, 5FU  . BCC (basal cell carcinoma) 11/15/2004   RIGHT EAR- TX CURET X 3, EXCISION  . BCC (basal cell carcinoma) 08/13/2005   RIGHT EAR- TX MOHS  . BCC (basal cell carcinoma) 04/01/2008   SUPERFICIAL- UPPER LEFT BACK- TX CURET X 3, 5FU  . BCC (basal cell carcinoma) 12/23/2012   ULCERATED- ABOVE RIGHT LIP- TX MOHS  . Cataract   . Chronic kidney disease   . Colon polyp   . Coronary artery disease    (left main normal, LAD 25-30% stenosis, distal 30-40% stenosis, circumflex obtuse marginal 50% stenosis,  right coronary artery dominant with long 75% followed by mid 80%  stenosis followed by 90% stenosis at the ostium of the PDA.  He had  stenting of the PDA by Dr. [name].  This was a Cypher stent.   First angioplasty was 1994, Cath 2011 100% RCA ).      . Diabetes mellitus without complication ([place])   . Essential hypertension, benign   . Glaucoma   . Hyperlipidemia   . Hyperplasia of prostate   . 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 Melanoma ([place]) 12/23/2012   IN SITU- LEFT SIDEBURN-TX MOHS  . Melanoma ([place]) 02/27/2017   LEFT CHEEK- TX MOHS  . Myocardial infarction ([place])    per pt/ had mild heart attacks/has 3 stents  . OSA (obstructive sleep apnea) 08/17/2016  . 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 SCC (squamous cell carcinoma) 10/14/2019   WELL DIFF- RIGHT SHOULDER-TX CURET X 3, 5FU  . SCC (squamous cell carcinoma) 01/24/2019   WELL DIFF- LEFT INNER CHEEK- MOHS  . 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   LEFT FOREHEAD-TX CURET X 3, 5FU  . SCCA (squamous cell carcinoma) of skin 04/01/2008    RIGHT HAND- TX CURET X 3, 5FU  . 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   LEFT SIDE OF FACE- TX CURET AFTER BIOPSY  . Sleep apnea    no c-pap    Current Outpatient Medications:  .  amLODipine (NORVASC) 10 MG tablet, TAKE 1 TABLET DAILY, Disp: 90 tablet, Rfl: 0 .  aspirin 81 MG EC tablet, Take 81 mg by mouth daily.  , Disp: , Rfl:  .  carvedilol (COREG) 12.5 MG tablet, TAKE  (1)  TABLET TWICE A DAY WITH MEALS (BREAKFAST AND SUPPER), Disp: 60 tablet, Rfl: 0 .  enalapril (VASOTEC) 2.5 MG tablet, Take 1 tablet (2.5 mg total) by mouth daily for 30 days., Disp: 90 tablet, Rfl: 3 .  furosemide (LASIX) 20 MG tablet, Take 1/2 tablet (10 mg total) by mouth daily., Disp: 90 tablet, Rfl: 1 .  glimepiride (AMARYL) 4 MG tablet, TAKE (1) TABLET TWICE A DAY, Disp: 60 tablet, Rfl: 0 .  icosapent Ethyl (VASCEPA) 1 g capsule, Take 2 capsules (2 g total) by mouth 2 (two) times daily., Disp: 360 capsule, Rfl: 0 .  latanoprost (XALATAN) 0.005 % ophthalmic solution, Place 1 drop into both eyes at bedtime., Disp: , Rfl:  .  nitroGLYCERIN (NITROSTAT) 0.4 MG SL tablet, Place 1 tablet (0.4 mg total) under the tongue every 5 (five) minutes as needed., Disp: 25 tablet, Rfl: 3 .  rosuvastatin (CRESTOR) 20 MG tablet, Take 1 tablet (20 mg total) by mouth daily., Disp: 90 tablet, Rfl: 3 .  sitaGLIPtin (JANUVIA) 25 MG tablet, Take 1 tablet (25 mg total) by mouth daily. For diabetes, Disp: 90 tablet, Rfl: 3 .  tamsulosin (FLOMAX) 0.4 MG CAPS capsule, TAKE (1) CAPSULE DAILY, Disp: 30 capsule, Rfl: 0 Social History   Socioeconomic History  . Marital status: Married    Spouse name: Not on file  . Number of children: 2  . Years of education: Not on file  . Highest education level: Not on file  Occupational History  . Occupation: retired  Tobacco Use  . Smoking status: Former Smoker    Packs/day: 2.00    Years: 25.00    Pack years: 50.00    Types: Cigarettes    Quit date: 08/14/1991    Years since quitting:  28.8  . Smokeless tobacco: Never Used  Vaping Use  . Vaping Use: Never used  Substance and Sexual Activity  . Alcohol use: Yes    Comment: rare  . Drug use: No  . Sexual activity: Yes  Other Topics Concern  . Not on file  Social History Narrative  . Not on file   Social Determinants of Health   Financial Resource Strain:   . Difficulty of Paying Living Expenses:   Food Insecurity:   . Worried About Charity fundraiser in the Last Year:   . [place] in the Last Year:  Transportation Needs:   . Film/video editor (Medical):   Marland Kitchen Lack of Transportation (Non-Medical):   Physical Activity:   . Days of Exercise per Week:   . Minutes of Exercise per Session:   Stress:   . Feeling of Stress :   Social Connections:   . Frequency of Communication with Friends and Family:   . Frequency of Social Gatherings with Friends and Family:   . Attends Religious Services:   . Active Member of Clubs or Organizations:   . Attends Archivist Meetings:   Marland Kitchen Marital Status:   Intimate Partner Violence:   . Fear of Current or Ex-Partner:   . Emotionally Abused:   Marland Kitchen Physically Abused:   . Sexually Abused:    Family History  Problem Relation Age of Onset  . Cancer Mother        unsure of type  . Glaucoma Mother   . Heart disease Father   . Coronary artery disease Father   . Cancer Sister 14       granlocytosis ([place]'s)  . Colon cancer Brother 37  . Nephritis Sister        20's  . Cancer Brother        lung     Objective: Office vital signs reviewed. BP 133/66   Pulse 81   Temp 97.7 F (36.5 C) (Temporal)   Ht 5' 7.5" (1.715 m)   Wt 185 lb (83.9 kg)   SpO2 96%   BMI 28.55 kg/m   Physical Examination:  General: Awake, alert, well nourished, elderly gentleman. No acute distress HEENT: Normal; no carotid bruits.  Sclera white Cardio: regular rate and rhythm, S1S2 heard, no murmurs appreciated Pulm: clear to auscultation bilaterally, no wheezes,  rhonchi or rales; normal work of breathing on room air Extremities: warm, well perfused, No edema, cyanosis or clubbing; +2 pulses bilaterally MSK: normal gait and station Neuro: No focal neurologic deficits  Assessment/ Plan: 79 y.o. male   1. Type 2 diabetes mellitus with diabetic nephropathy, without long-term current use of insulin (HCC) Controlled.  Continue current regimen.  Caution with the glimepiride.  Ideally I would like him to come off of this given age and renal dysfunction.  We will continue to evaluate intervally and plan to adjust medication. - CMP14+EGFR - Bayer DCA Hb A1c Waived - sitaGLIPtin (JANUVIA) 25 MG tablet; Take 1 tablet (25 mg total) by mouth daily. For diabetes  Dispense: 90 tablet; Refill: 3 - glimepiride (AMARYL) 4 MG tablet; Take 1 tablet (4 mg total) by mouth in the morning and at bedtime.  Dispense: 180 tablet; Refill: 3  2. Establishing care with new doctor, encounter for  3. Hypertension associated with type 2 diabetes mellitus ([place]) Controlled.  Continue current regimen - CMP14+EGFR - enalapril (VASOTEC) 2.5 MG tablet; Take 1 tablet (2.5 mg total) by mouth daily.  Dispense: 90 tablet; Refill: 3 - furosemide (LASIX) 20 MG tablet; Take 1/2 tablet (10 mg total) by mouth daily.  Dispense: 45 tablet; Refill: 3 - carvedilol (COREG) 12.5 MG tablet; TAKE  (1)  TABLET TWICE A DAY WITH MEALS (BREAKFAST AND SUPPER)  Dispense: 180 tablet; Refill: 3 - amLODipine (NORVASC) 10 MG tablet; Take 1 tablet (10 mg total) by mouth daily.  Dispense: 90 tablet; Refill: 3  4. Hyperlipidemia associated with type 2 diabetes mellitus (HCC) Check fasting lipid - CMP14+EGFR - Lipid Panel - TSH - icosapent Ethyl (VASCEPA) 1 g capsule; Take 2 capsules (2 g total) by mouth 2 (  two) times daily.  Dispense: 360 capsule; Refill: 3  5. CKD stage 3 due to type 2 diabetes mellitus (HCC) - CMP14+EGFR - enalapril (VASOTEC) 2.5 MG tablet; Take 1 tablet (2.5 mg total) by mouth daily.   Dispense: 90 tablet; Refill: 3  6. Thrombocytopenia (HCC) - CBC with Differential  7. Benign prostatic hyperplasia with post-void dribbling Having some urinary leakage.  I have placed referral back to urology as he wishes for reevaluation - PSA - tamsulosin (FLOMAX) 0.4 MG CAPS capsule; Take 1 capsule (0.4 mg total) by mouth daily.  Dispense: 90 capsule; Refill: 3 - Ambulatory referral to Urology  8. Encounter for hepatitis C screening test for low risk patient - Hepatitis C antibody   Orders Placed This Encounter  Procedures  . PSA  . CMP14+EGFR  . Bayer DCA Hb A1c Waived  . Lipid Panel  . TSH  . CBC with Differential  . Hepatitis C antibody   No orders of the defined types were placed in this encounter.    Timothy Norlander, DO [place] [phone]

## 2020-06-28 LAB — CBC WITH DIFFERENTIAL/PLATELET
Basophils Absolute: 0.1 10*3/uL (ref 0.0–0.2)
Basos: 1 %
EOS (ABSOLUTE): 0.1 10*3/uL (ref 0.0–0.4)
Eos: 2 %
Hematocrit: 36.1 % — ABNORMAL LOW (ref 37.5–51.0)
Hemoglobin: 12.7 g/dL — ABNORMAL LOW (ref 13.0–17.7)
Immature Grans (Abs): 0 10*3/uL (ref 0.0–0.1)
Immature Granulocytes: 1 %
Lymphocytes Absolute: 2.1 10*3/uL (ref 0.7–3.1)
Lymphs: 35 %
MCH: 31 pg (ref 26.6–33.0)
MCHC: 35.2 g/dL (ref 31.5–35.7)
MCV: 88 fL (ref 79–97)
Monocytes Absolute: 0.4 10*3/uL (ref 0.1–0.9)
Monocytes: 7 %
Neutrophils Absolute: 3.2 10*3/uL (ref 1.4–7.0)
Neutrophils: 54 %
Platelets: 172 10*3/uL (ref 150–450)
RBC: 4.1 x10E6/uL — ABNORMAL LOW (ref 4.14–5.80)
RDW: 13.4 % (ref 11.6–15.4)
WBC: 5.9 10*3/uL (ref 3.4–10.8)

## 2020-06-28 LAB — CMP14+EGFR
ALT: 17 IU/L (ref 0–44)
AST: 21 IU/L (ref 0–40)
Albumin/Globulin Ratio: 1.5 (ref 1.2–2.2)
Albumin: 4.1 g/dL (ref 3.7–4.7)
Alkaline Phosphatase: 45 IU/L — ABNORMAL LOW (ref 48–121)
BUN/Creatinine Ratio: 11 (ref 10–24)
BUN: 16 mg/dL (ref 8–27)
Bilirubin Total: 0.5 mg/dL (ref 0.0–1.2)
CO2: 18 mmol/L — ABNORMAL LOW (ref 20–29)
Calcium: 9.3 mg/dL (ref 8.6–10.2)
Chloride: 104 mmol/L (ref 96–106)
Creatinine, Ser: 1.49 mg/dL — ABNORMAL HIGH (ref 0.76–1.27)
GFR calc Af Amer: 51 mL/min/{1.73_m2} — ABNORMAL LOW (ref >59)
GFR calc non Af Amer: 44 mL/min/{1.73_m2} — ABNORMAL LOW (ref >59)
Globulin, Total: 2.7 g/dL (ref 1.5–4.5)
Glucose: 165 mg/dL — ABNORMAL HIGH (ref 65–99)
Potassium: 3.8 mmol/L (ref 3.5–5.2)
Sodium: 141 mmol/L (ref 134–144)
Total Protein: 6.8 g/dL (ref 6.0–8.5)

## 2020-06-28 LAB — LIPID PANEL
Chol/HDL Ratio: 5.6 ratio — ABNORMAL HIGH (ref 0.0–5.0)
Cholesterol, Total: 135 mg/dL (ref 100–199)
HDL: 24 mg/dL — ABNORMAL LOW (ref >39)
LDL Chol Calc (NIH): 64 mg/dL (ref 0–99)
Triglycerides: 293 mg/dL — ABNORMAL HIGH (ref 0–149)
VLDL Cholesterol Cal: 47 mg/dL — ABNORMAL HIGH (ref 5–40)

## 2020-06-28 LAB — HEPATITIS C ANTIBODY: Hep C Virus Ab: <0.1 s/co ratio (ref 0.0–0.9)

## 2020-06-28 LAB — PSA: Prostate Specific Ag, Serum: 0.2 ng/mL (ref 0.0–4.0)

## 2020-06-28 LAB — TSH: TSH: 3.43 u[IU]/mL (ref 0.450–4.500)

## 2020-07-13 ENCOUNTER — Telehealth: Payer: Self-pay | Admitting: *Deleted

## 2020-07-13 NOTE — Telephone Encounter (Signed)
Fax from [facility] request for Fenofibric Acid Dr 135 mg 1 QD #90 Last Ov 7/12 not on current med list Next OV not sched

## 2020-07-15 ENCOUNTER — Other Ambulatory Visit: Payer: Self-pay | Admitting: Family Medicine

## 2020-07-15 DIAGNOSIS — E785 Hyperlipidemia, unspecified: Secondary | ICD-10-CM

## 2020-07-15 DIAGNOSIS — E1169 Type 2 diabetes mellitus with other specified complication: Secondary | ICD-10-CM

## 2020-07-15 MED ORDER — FENOFIBRIC ACID 135 MG PO CPDR: DELAYED_RELEASE_CAPSULE | ORAL | 3 refills | Status: DC

## 2020-08-01 ENCOUNTER — Other Ambulatory Visit: Payer: Self-pay | Admitting: *Deleted

## 2020-08-01 DIAGNOSIS — K219 Gastro-esophageal reflux disease without esophagitis: Secondary | ICD-10-CM

## 2020-08-02 ENCOUNTER — Telehealth: Payer: Self-pay | Admitting: Family Medicine

## 2020-08-02 MED ORDER — ESOMEPRAZOLE MAGNESIUM 40 MG PO CPDR
40.0000 mg | DELAYED_RELEASE_CAPSULE | Freq: Every day | ORAL | 3 refills | Status: DC
Start: 2020-08-02 — End: 2021-07-31

## 2020-08-02 NOTE — Telephone Encounter (Signed)
Refill sent patient aware  

## 2020-08-02 NOTE — Telephone Encounter (Signed)
Pt called stating that he is out of refills on his acid reflux medicine and is requesting that Dr Darnell Level send in refills to [facility]. Explained to pt that we did received the refill request but it was denied with note stating that refill was not appropriate because it is no longer on pts med list. Pt says he was unaware that he was taken off of this medicine and needs refills.

## 2020-08-02 NOTE — Telephone Encounter (Signed)
I think this was a clerical error when he was seeing [facility].  He used to be on nexium 40mg  daily.  I suspect that is what he is asking for.  Ok to fill #[phone] w/3 rf

## 2020-08-02 NOTE — Telephone Encounter (Signed)
No reflux medication on med list

## 2020-08-18 NOTE — Progress Notes (Signed)
Subjective: 1. BPH with urinary obstruction   2. Urge incontinence      Mr. Timothy [name] is a 79 yo male who is sent in consultation by Dr. Lajuana Ripple for BPH with LUTS.  He was previously seen by Dr. Pilar Jarvis in 2018 for incontinence.  He continues to have issues with incontinence.  When he gets the urge he will leak.  He has some insensible incontinence as well and wears depends.  He doesn't think he has SUI.  He has an adequate stream and generally feels he empties well.  He has no intermittency.  He has minimal nocturia but only sleeps about 5 hours nightly.    He has no hematuria or dysuria.  He has had no GU surgery.  He has had one prior UTI a couple of months ago.  He has no history of stones.   He is currently on tamsulosin.  His PSA was 0.2 on 06/27/20 and that is stable for the last 6 years.  His last testosterone level was from 9/14 and was 488.  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  He had a CT in 2019 that showed no renal issues.  The prostate was not very enlarged with a transverse diameter of 4.2cm. His PVR today is 212ml.    ROS:  ROS  Allergies  Allergen Reactions  . Hydrocodone-Acetaminophen Other (See Comments)    Makes pt. Feel loopy    Past Medical History:  Diagnosis Date  . 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 BCC (basal cell carcinoma) 11/15/2004   LEFT CHEST- TX CURET X 3, 5FU  . BCC (basal cell carcinoma) 11/15/2004   RIGHT EAR- TX CURET X 3, EXCISION  . BCC (basal cell carcinoma) 08/13/2005   RIGHT EAR- TX MOHS  . BCC (basal cell carcinoma) 04/01/2008   SUPERFICIAL- UPPER LEFT BACK- TX CURET X 3, 5FU  . BCC (basal cell carcinoma) 12/23/2012   ULCERATED- ABOVE RIGHT LIP- TX MOHS  . Cataract   . Chronic kidney disease   . Colon polyp   . Coronary artery disease    (left main normal, LAD 25-30% stenosis, distal 30-40% stenosis, circumflex obtuse marginal  50% stenosis,  right coronary artery dominant with long 75% followed by mid 80%  stenosis followed by 90% stenosis at the ostium of the PDA.  He had  stenting of the PDA by Dr. [name].  This was a Cypher stent.   First angioplasty was 1994, Cath 2011 100% RCA ).      . Diabetes mellitus without complication ([place])   . Essential hypertension, benign   . Glaucoma   . Hyperlipidemia   . Hyperplasia of prostate   . 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 Melanoma ([place]) 12/23/2012   IN SITU- LEFT SIDEBURN-TX MOHS  . Melanoma ([place]) 02/27/2017   LEFT CHEEK- TX MOHS  . Myocardial infarction ([place])    per pt/ had mild heart attacks/has 3 stents  . OSA (obstructive sleep apnea) 08/17/2016  . 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 SCC (squamous cell carcinoma) 10/14/2019   WELL DIFF- RIGHT SHOULDER-TX CURET X 3, 5FU  . SCC (squamous cell carcinoma) 01/24/2019   WELL DIFF- LEFT INNER CHEEK-  MOHS  . 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   LEFT FOREHEAD-TX CURET X 3, 5FU  . SCCA (squamous cell carcinoma) of skin 04/01/2008   RIGHT HAND- TX CURET X 3, 5FU  . 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   LEFT SIDE OF FACE- TX CURET AFTER BIOPSY  . Sleep apnea    no c-pap    Past Surgical History:  Procedure Laterality Date  . APPENDECTOMY    . CERVICAL SPINE SURGERY    . CHOLECYSTECTOMY    . COLONOSCOPY  2004   multiple since  . CORONARY ANGIOPLASTY WITH STENT PLACEMENT     3 sents  . EYE SURGERY Bilateral    cataracts  . HERNIA REPAIR Bilateral   . SKIN CANCER EXCISION  03/17/2017  . SKIN CANCER EXCISION  03/17/2020   left side of face    Social History   Socioeconomic History  . Marital status: Married    Spouse name:  Not on file  . Number of children: 2  . Years of education: Not on file  . Highest education level: Not on file  Occupational History  . Occupation: retired  Tobacco Use  . Smoking status: Former Smoker    Packs/day: 2.00    Years: 25.00    Pack years: 50.00    Types: Cigarettes    Quit date: 08/14/1991    Years since quitting: 29.0  . Smokeless tobacco: Never Used  Vaping Use  . Vaping Use: Never used  Substance and Sexual Activity  . Alcohol use: Yes    Comment: rare  . Drug use: No  . Sexual activity: Yes  Other Topics Concern  . Not on file  Social History Narrative  . Not on file   Social Determinants of Health   Financial Resource Strain:   . Difficulty of Paying Living Expenses: Not on file  Food Insecurity:   . Worried About Charity fundraiser in the Last Year: Not on file  . Ran Out of Food in the Last Year: Not on file  Transportation Needs:   . Lack of Transportation (Medical): Not on file  . Lack of Transportation (Non-Medical): Not on file  Physical Activity:   . Days of Exercise per Week: Not on file  . Minutes of Exercise per Session: Not on file  Stress:   . Feeling of Stress : Not on file  Social Connections:   . Frequency of Communication with Friends and Family: Not on file  . Frequency of Social Gatherings with Friends and Family: Not on file  . Attends Religious Services: Not on file  . Active Member of Clubs or Organizations: Not on file  . Attends Archivist Meetings: Not on file  . Marital Status: Not on file  Intimate Partner Violence:   . Fear of Current or Ex-Partner: Not on file  . Emotionally Abused: Not on file  . Physically Abused: Not on file  . Sexually Abused: Not on file    Family History  Problem Relation Age of Onset  . Cancer Mother        unsure of type  . Glaucoma Mother   . Heart disease Father   . Coronary artery disease Father   . Cancer Sister 27       granlocytosis (Wergerner's)  . Colon cancer  Brother 8  .  Nephritis Sister        20's  . Cancer Brother        lung     Anti-infectives: Anti-infectives (From admission, onward)   None      Current Outpatient Medications  Medication Sig Dispense Refill  . amLODipine (NORVASC) 10 MG tablet Take 1 tablet (10 mg total) by mouth daily. 90 tablet 3  . aspirin 81 MG EC tablet Take 81 mg by mouth daily.      . carvedilol (COREG) 12.5 MG tablet TAKE  (1)  TABLET TWICE A DAY WITH MEALS (BREAKFAST AND SUPPER) 180 tablet 3  . Choline Fenofibrate (FENOFIBRIC ACID) 135 MG CPDR TAKE (1) CAPSULE DAILY 90 capsule 3  . esomeprazole (NEXIUM) 40 MG capsule Take 1 capsule (40 mg total) by mouth daily. 90 capsule 3  . furosemide (LASIX) 20 MG tablet Take 1/2 tablet (10 mg total) by mouth daily. 45 tablet 3  . glimepiride (AMARYL) 4 MG tablet Take 1 tablet (4 mg total) by mouth in the morning and at bedtime. 180 tablet 3  . icosapent Ethyl (VASCEPA) 1 g capsule Take 2 capsules (2 g total) by mouth 2 (two) times daily. 360 capsule 3  . latanoprost (XALATAN) 0.005 % ophthalmic solution Place 1 drop into both eyes at bedtime.    . nitroGLYCERIN (NITROSTAT) 0.4 MG SL tablet Place 1 tablet (0.4 mg total) under the tongue every 5 (five) minutes as needed. 25 tablet 3  . rosuvastatin (CRESTOR) 20 MG tablet Take 1 tablet (20 mg total) by mouth daily. 90 tablet 3  . sitaGLIPtin (JANUVIA) 25 MG tablet Take 1 tablet (25 mg total) by mouth daily. For diabetes 90 tablet 3  . tamsulosin (FLOMAX) 0.4 MG CAPS capsule Take 1 capsule (0.4 mg total) by mouth daily. 90 capsule 3  . enalapril (VASOTEC) 2.5 MG tablet Take 1 tablet (2.5 mg total) by mouth daily. 90 tablet 3   No current facility-administered medications for this visit.     Objective: Vital signs in last 24 hours: BP 128/69   Pulse 83   Temp 97.6 F (36.4 C)   Intake/Output from previous day: No intake/output data recorded. Intake/Output this shift: @IOTHISSHIFT @   Physical  Exam Constitutional:      Appearance: Normal appearance. He is obese.  HENT:     Head: Normocephalic and atraumatic.  Cardiovascular:     Rate and Rhythm: Normal rate and regular rhythm.     Heart sounds: Normal heart sounds.  Pulmonary:     Effort: Pulmonary effort is normal. No respiratory distress.     Breath sounds: Normal breath sounds.  Abdominal:     Palpations: Abdomen is soft. There is no mass.     Hernia: No hernia is present.  Genitourinary:    Comments: Normal phallus with adequate meatus. Scrotum, testes and epididymis normal. AP without lesions. NST without mass. Prostate 1.5+ SV normal.  Musculoskeletal:        General: No swelling or tenderness. Normal range of motion.     Cervical back: Normal range of motion and neck supple.  Lymphadenopathy:     Cervical: No cervical adenopathy.  Skin:    General: Skin is warm and dry.  Neurological:     General: No focal deficit present.     Mental Status: He is alert and oriented to person, place, and time.  Psychiatric:        Mood and Affect: Mood normal.        Behavior:  Behavior normal.     Lab Results:  Results for orders placed or performed in visit on 08/19/20 (from the past 24 hour(s))  Urinalysis, Routine w reflex microscopic     Status: Abnormal   Collection Time: 08/19/20  9:54 AM  Result Value Ref Range   Specific Gravity, UA 1.015 1.005 - 1.030   pH, UA 5.0 5.0 - 7.5   Color, UA Yellow Yellow   Appearance Ur Clear Clear   Leukocytes,UA Negative Negative   Protein,UA Negative Negative/Trace   Glucose, UA 3+ (A) Negative   Ketones, UA Negative Negative   RBC, UA Negative Negative   Bilirubin, UA Negative Negative   Urobilinogen, Ur 0.2 0.2 - 1.0 mg/dL   Nitrite, UA Negative Negative   Microscopic Examination Comment    Narrative   Performed at:  [facility] [address] [place], [place]  [id number] Lab Director: [place], Phone:  [phone]    BMET No results for  input(s): NA, K, CL, CO2, GLUCOSE, BUN, CREATININE, CALCIUM in the last 72 hours. PT/INR No results for input(s): LABPROT, INR in the last 72 hours. ABG No results for input(s): PHART, HCO3 in the last 72 hours.  Invalid input(s): PCO2, PO2  Studies/Results: Prior CT AP from 2019 films reviewed.     Assessment/Plan: BPH with BOO and UUI with incomplete emptying.   I am going to get him set up for urodynamics to better clarify the nature of his bladder function.  His PSA is low so he doesn't have much prostate enlargement and didn't improve with tamsulosin but he has an elevated PVR so I am reluctant to empirically give and anticholinergic or B3 agonist.   I will consider cystoscopy at f/u.   No orders of the defined types were placed in this encounter.    Orders Placed This Encounter  Procedures  . Urinalysis, Routine w reflex microscopic  . Ambulatory referral to Urology    Referral Priority:   Routine    Referral Type:   Consultation    Referral Reason:   Specialty Services Required    Referred to Provider:   Irine Seal, MD    Requested Specialty:   Urology    Number of Visits Requested:   1  . Bladder scan     Return for Next available with results of Urodynamics for possible cystoscopy. .    CC: Dr. Adam Phenix.       Irine Seal 08/19/2020 [phone]

## 2020-08-19 ENCOUNTER — Ambulatory Visit (INDEPENDENT_AMBULATORY_CARE_PROVIDER_SITE_OTHER): Payer: Medicare HMO | Admitting: Urology

## 2020-08-19 ENCOUNTER — Other Ambulatory Visit: Payer: Self-pay

## 2020-08-19 ENCOUNTER — Encounter: Payer: Self-pay | Admitting: Urology

## 2020-08-19 VITALS — BP 128/69 | HR 83 | Temp 97.6°F

## 2020-08-19 DIAGNOSIS — N3941 Urge incontinence: Secondary | ICD-10-CM | POA: Diagnosis not present

## 2020-08-19 DIAGNOSIS — R339 Retention of urine, unspecified: Secondary | ICD-10-CM | POA: Diagnosis not present

## 2020-08-19 DIAGNOSIS — N138 Other obstructive and reflux uropathy: Secondary | ICD-10-CM

## 2020-08-19 DIAGNOSIS — N401 Enlarged prostate with lower urinary tract symptoms: Secondary | ICD-10-CM | POA: Diagnosis not present

## 2020-08-19 LAB — URINALYSIS, ROUTINE W REFLEX MICROSCOPIC
Bilirubin, UA: NEGATIVE
Ketones, UA: NEGATIVE
Leukocytes,UA: NEGATIVE
Nitrite, UA: NEGATIVE
Protein,UA: NEGATIVE
RBC, UA: NEGATIVE
Specific Gravity, UA: 1.015 (ref 1.005–1.030)
Urobilinogen, Ur: 0.2 mg/dL (ref 0.2–1.0)
pH, UA: 5 (ref 5.0–7.5)

## 2020-08-19 LAB — BLADDER SCAN: Scan Result: 218.8

## 2020-09-14 ENCOUNTER — Telehealth (HOSPITAL_COMMUNITY): Payer: Self-pay | Admitting: *Deleted

## 2020-09-14 ENCOUNTER — Ambulatory Visit (INDEPENDENT_AMBULATORY_CARE_PROVIDER_SITE_OTHER): Payer: Medicare HMO | Admitting: Cardiology

## 2020-09-14 ENCOUNTER — Other Ambulatory Visit: Payer: Self-pay

## 2020-09-14 ENCOUNTER — Encounter: Payer: Self-pay | Admitting: *Deleted

## 2020-09-14 ENCOUNTER — Encounter: Payer: Self-pay | Admitting: Cardiology

## 2020-09-14 VITALS — BP 122/64 | HR 75 | Ht 67.5 in | Wt 193.0 lb

## 2020-09-14 DIAGNOSIS — R072 Precordial pain: Secondary | ICD-10-CM | POA: Diagnosis not present

## 2020-09-14 DIAGNOSIS — I10 Essential (primary) hypertension: Secondary | ICD-10-CM | POA: Diagnosis not present

## 2020-09-14 DIAGNOSIS — I2510 Atherosclerotic heart disease of native coronary artery without angina pectoris: Secondary | ICD-10-CM

## 2020-09-14 MED ORDER — ISOSORBIDE MONONITRATE ER 60 MG PO TB24: 60.0000 mg | ORAL_TABLET | Freq: Every day | ORAL | 3 refills | Status: DC

## 2020-09-14 MED ORDER — NITROGLYCERIN 0.4 MG SL SUBL: 0.4000 mg | SUBLINGUAL_TABLET | SUBLINGUAL | 4 refills | Status: DC | PRN

## 2020-09-14 NOTE — Progress Notes (Signed)
Cardiology Office Note   Date:  09/14/2020   ID:  Timothy [name], DOB [birth year], MRN [id number]  PCP:  Janora Norlander, DO  Cardiologist:   Minus Breeding, MD   Chief Complaint  Patient presents with  . Chest Pain      History of Present Illness: Timothy [name] is a 79 y.o. male who presents for follow up of CAD.   He walked in today complaining of chest pain.  He said this started on Monday.  He was walking through a store.  He has had in the last couple of days at rest.  He says it feels like gas.  To follow-up with him discomfort at its peak but it only lasts for a few seconds.  He had different than his previous angina.  It is really under his sternum in the upper epigastric area and spreads under both ribs bilaterally.  There is a little tenderness to palpation and perhaps a little discomfort with movement.  He is not getting any discomfort in his jaw or down his arms.  He is not having any associated symptoms such as nausea vomiting or diaphoresis.  He has had no palpitations, presyncope or syncope.  He is not taking anything to try to get rid of this.  He is otherwise been feeling well.  He did a little unloading of some lumbar yesterday and really did not bring this on.   Past Medical History:  Diagnosis Date  . 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 BCC (basal cell carcinoma) 11/15/2004   LEFT CHEST- TX CURET X 3, 5FU  . BCC (basal cell carcinoma) 11/15/2004   RIGHT EAR- TX CURET X 3, EXCISION  . BCC (basal cell carcinoma) 08/13/2005   RIGHT EAR- TX MOHS  . BCC (basal cell carcinoma) 04/01/2008   SUPERFICIAL- UPPER LEFT BACK- TX CURET X 3, 5FU  . BCC (basal cell carcinoma) 12/23/2012   ULCERATED- ABOVE RIGHT LIP- TX MOHS  . Cataract   . Chronic kidney disease   . Colon polyp   . Coronary artery disease    (left main normal, LAD 25-30% stenosis, distal  30-40% stenosis, circumflex obtuse marginal 50% stenosis,  right coronary artery dominant with long 75% followed by mid 80%  stenosis followed by 90% stenosis at the ostium of the PDA.  He had  stenting of the PDA by Dr. [name].  This was a Cypher stent.   First angioplasty was 1994, Cath 2011 100% RCA ).      . Diabetes mellitus without complication ([place])   . Essential hypertension, benign   . Glaucoma   . Hyperlipidemia   . Hyperplasia of prostate   . 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 Melanoma ([place]) 12/23/2012   IN SITU- LEFT SIDEBURN-TX MOHS  . Melanoma ([place]) 02/27/2017   LEFT CHEEK- TX MOHS  . Myocardial infarction ([place])    per pt/ had mild heart attacks/has 3 stents  . OSA (obstructive sleep apnea) 08/17/2016  . 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 SCC (squamous cell carcinoma) 10/14/2019   WELL DIFF- RIGHT SHOULDER-TX CURET X 3, 5FU  . SCC (squamous cell carcinoma) 01/24/2019   WELL DIFF- LEFT INNER CHEEK- MOHS  .  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   LEFT FOREHEAD-TX CURET X 3, 5FU  . SCCA (squamous cell carcinoma) of skin 04/01/2008   RIGHT HAND- TX CURET X 3, 5FU  . 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   LEFT SIDE OF FACE- TX CURET AFTER BIOPSY  . Sleep apnea    no c-pap    Past Surgical History:  Procedure Laterality Date  . APPENDECTOMY    . CERVICAL SPINE SURGERY    . CHOLECYSTECTOMY    . COLONOSCOPY  2004   multiple since  . CORONARY ANGIOPLASTY WITH STENT PLACEMENT     3 sents  . EYE SURGERY Bilateral    cataracts  . HERNIA REPAIR Bilateral   . SKIN CANCER EXCISION  03/17/2017  . SKIN CANCER EXCISION  03/17/2020   left side of face     Current Outpatient Medications   Medication Sig Dispense Refill  . amLODipine (NORVASC) 10 MG tablet Take 1 tablet (10 mg total) by mouth daily. 90 tablet 3  . aspirin 81 MG EC tablet Take 81 mg by mouth daily.      . carvedilol (COREG) 12.5 MG tablet TAKE  (1)  TABLET TWICE A DAY WITH MEALS (BREAKFAST AND SUPPER) 180 tablet 3  . Choline Fenofibrate (FENOFIBRIC ACID) 135 MG CPDR TAKE (1) CAPSULE DAILY 90 capsule 3  . enalapril (VASOTEC) 2.5 MG tablet Take 1 tablet (2.5 mg total) by mouth daily. 90 tablet 3  . esomeprazole (NEXIUM) 40 MG capsule Take 1 capsule (40 mg total) by mouth daily. 90 capsule 3  . furosemide (LASIX) 20 MG tablet Take 1/2 tablet (10 mg total) by mouth daily. 45 tablet 3  . glimepiride (AMARYL) 4 MG tablet Take 1 tablet (4 mg total) by mouth in the morning and at bedtime. 180 tablet 3  . icosapent Ethyl (VASCEPA) 1 g capsule Take 2 capsules (2 g total) by mouth 2 (two) times daily. 360 capsule 3  . latanoprost (XALATAN) 0.005 % ophthalmic solution Place 1 drop into both eyes at bedtime.    . rosuvastatin (CRESTOR) 20 MG tablet Take 1 tablet (20 mg total) by mouth daily. 90 tablet 3  . sitaGLIPtin (JANUVIA) 25 MG tablet Take 1 tablet (25 mg total) by mouth daily. For diabetes 90 tablet 3  . tamsulosin (FLOMAX) 0.4 MG CAPS capsule Take 1 capsule (0.4 mg total) by mouth daily. 90 capsule 3  . isosorbide mononitrate (IMDUR) 60 MG 24 hr tablet Take 1 tablet (60 mg total) by mouth daily. 90 tablet 3  . nitroGLYCERIN (NITROSTAT) 0.4 MG SL tablet Place 1 tablet (0.4 mg total) under the tongue every 5 (five) minutes as needed. 25 tablet 4   No current facility-administered medications for this visit.    Allergies:   Hydrocodone-acetaminophen     ROS:  Please see the history of present illness.   Otherwise, review of systems are positive for urinary incontinence.   All other systems are reviewed and negative.    PHYSICAL EXAM: VS:  BP 122/64   Pulse 75   Ht 5' 7.5" (1.715 m)   Wt 193 lb (87.5 kg)   BMI  29.78 kg/m  , BMI Body mass index is 29.78 kg/m. GENERAL:  Well appearing NECK:  No jugular venous distention, waveform within normal limits, carotid upstroke brisk and symmetric, no bruits, no thyromegaly LUNGS:  Clear to auscultation bilaterally CHEST:  Unremarkable HEART:  PMI not displaced or sustained,S1 and S2 within normal limits, no S3, no S4, no clicks, no rubs, no murmurs ABD:  Flat, positive bowel sounds normal in frequency in pitch, no bruits, no rebound, no guarding, no midline pulsatile mass, no hepatomegaly, no splenomegaly EXT:  2 plus pulses throughout, no edema, no cyanosis no clubbing  EKG:  EKG is ordered today. The ekg ordered today demonstrates sinus rhythm, 75, axis within normal limits, intervals within normal limits, early transition lead V2   Recent Labs: 06/27/2020: ALT 17; BUN 16; Creatinine, Ser 1.49; Hemoglobin 12.7; Platelets 172; Potassium 3.8; Sodium 141; TSH 3.430    Lipid Panel    Component Value Date/Time   CHOL 135 06/27/2020 0814   TRIG 293 (H) 06/27/2020 0814   TRIG 538 (H) 09/22/2016 0806   HDL 24 (L) 06/27/2020 0814   HDL 15 (L) 09/22/2016 0806   CHOLHDL 5.6 (H) 06/27/2020 0814   CHOLHDL 4.9 04/08/2013 0827   VLDL 55 (H) 04/08/2013 0827   LDLCALC 64 06/27/2020 0814   LDLCALC 48 07/16/2014 0818   LDLDIRECT 88 03/01/2020 0000      Wt Readings from Last 3 Encounters:  09/14/20 193 lb (87.5 kg)  06/27/20 185 lb (83.9 kg)  04/06/20 190 lb (86.2 kg)      Other studies Reviewed: Additional studies/ records that were reviewed today include: Labs. Review of the above records demonstrates:  Please see elsewhere in the note.     ASSESSMENT AND PLAN:  CAD - He was a walk in today with new symptoms.  He is having some chest discomfort that has atypical greater than typical features.  I went back and and his most recent catheterization results from 2011.  He had an occluded RCA which had previously been stented and nonobstructive disease  in the left system.  I do not have a high index of suspicion that this is new obstructive coronary disease but he needs to be screened.  He says he would not be able to walk on a treadmill and does not try this.  He will have a [employer].  I am going to start Imdur 60 mg daily.  I will also give him sublingual nitroglycerin.   HTN -  The blood pressure is at target.  No change in therapy.   DYSLIPIDEMIA - LDL was 64 but his triglycerides were 293.    SLEEP APNEA - He could not afford CPAP.    No change in therapy.  CKD STAGE III- Creatinine was 1.49 in July.  This is improved from a peak of 2.06.    DM - This is followed by Janora Norlander, DO.  A1c was 6.7.  This is improved from 7.7 previously.    COVID-19 Education: He has had his vaccinations.  Current medicines are reviewed at length with the patient today.  The patient does not have concerns regarding medicines.  The following changes have been made:  no change  Labs/ tests ordered today include:   Orders Placed This Encounter  Procedures  . MYOCARDIAL PERFUSION IMAGING  . EKG 12-Lead     Disposition:   FU with me in 4 months   Signed, Minus Breeding, MD  09/14/2020 10:32 AM    [place]

## 2020-09-14 NOTE — Patient Instructions (Signed)
Medication Instructions:  Please take Isosorbide 60 mg a day.  Continue all other medications as listed.  *If you need a refill on your cardiac medications before your next appointment, please call your pharmacy*  Testing/Procedures: Your physician has requested that you have a lexiscan myoview. For further information please visit [email or web address]. Please follow instruction sheet, as given.  Follow-Up: At [facility], you and your health needs are our priority.  As part of our continuing mission to provide you with exceptional heart care, we have created designated Provider Care Teams.  These Care Teams include your primary Cardiologist (physician) and Advanced Practice Providers (APPs -  Physician Assistants and Nurse Practitioners) who all work together to provide you with the care you need, when you need it.  We recommend signing up for the patient portal called "MyChart".  Sign up information is provided on this After Visit Summary.  MyChart is used to connect with patients for Virtual Visits (Telemedicine).  Patients are able to view lab/test results, encounter notes, upcoming appointments, etc.  Non-urgent messages can be sent to your provider as well.   To learn more about what you can do with MyChart, go to [email or web address].    Your next appointment:   3 month(s)  The format for your next appointment:   In Person  Provider:   Minus Breeding, MD  Thank you for choosing [facility]!!

## 2020-09-14 NOTE — Telephone Encounter (Signed)
Patient given detailed instructions per Myocardial Perfusion Study Information Sheet for the test on 09/21/2020 at 0800. Patient notified to arrive 15 minutes early and that it is imperative to arrive on time for appointment to keep from having the test rescheduled.  If you need to cancel or reschedule your appointment, please call the office within 24 hours of your appointment. . Patient verbalized understanding.Jesenia Spera, [place] Patient has letter from office with instructions.

## 2020-09-21 ENCOUNTER — Other Ambulatory Visit: Payer: Self-pay

## 2020-09-21 ENCOUNTER — Ambulatory Visit (HOSPITAL_COMMUNITY): Payer: Medicare HMO | Attending: Internal Medicine

## 2020-09-21 DIAGNOSIS — I2510 Atherosclerotic heart disease of native coronary artery without angina pectoris: Secondary | ICD-10-CM | POA: Diagnosis present

## 2020-09-21 DIAGNOSIS — R072 Precordial pain: Secondary | ICD-10-CM

## 2020-09-21 LAB — MYOCARDIAL PERFUSION IMAGING
LV dias vol: 104 mL (ref 62–150)
LV sys vol: 56 mL
Peak HR: 88 {beats}/min
Rest HR: 74 {beats}/min
SDS: 2
SRS: 6
SSS: 9
TID: 1.04

## 2020-09-21 MED ORDER — REGADENOSON 0.4 MG/5ML IV SOLN
0.4000 mg | Freq: Once | INTRAVENOUS | Status: AC
  Administered 2020-09-21: 0.4 mg via INTRAVENOUS

## 2020-09-21 MED ORDER — TECHNETIUM TC 99M TETROFOSMIN IV KIT
31.9000 | PACK | Freq: Once | INTRAVENOUS | Status: AC | PRN
  Administered 2020-09-21: 31.9 via INTRAVENOUS
  Filled 2020-09-21: qty 32

## 2020-09-21 MED ORDER — TECHNETIUM TC 99M TETROFOSMIN IV KIT
9.4000 | PACK | Freq: Once | INTRAVENOUS | Status: AC | PRN
  Administered 2020-09-21: 9.4 via INTRAVENOUS
  Filled 2020-09-21: qty 10

## 2020-09-23 ENCOUNTER — Telehealth: Payer: Self-pay | Admitting: Cardiology

## 2020-09-23 NOTE — Telephone Encounter (Signed)
Pt requesting Myoview results from 10/6 Will forward to Dr Percival Spanish for review .Adonis Housekeeper

## 2020-09-23 NOTE — Telephone Encounter (Signed)
Reviewed with the patient

## 2020-09-23 NOTE — Telephone Encounter (Signed)
Patient is requesting to discuss results from stress test completed on 09/21/20. Please call.

## 2020-10-18 ENCOUNTER — Ambulatory Visit (INDEPENDENT_AMBULATORY_CARE_PROVIDER_SITE_OTHER): Payer: Medicare HMO

## 2020-10-18 ENCOUNTER — Other Ambulatory Visit: Payer: Self-pay

## 2020-10-18 DIAGNOSIS — Z23 Encounter for immunization: Secondary | ICD-10-CM | POA: Diagnosis not present

## 2020-10-21 ENCOUNTER — Other Ambulatory Visit: Payer: Medicare HMO | Admitting: Urology

## 2020-10-27 NOTE — Progress Notes (Addendum)
Subjective: 1. BPH with urinary obstruction   2. Urge incontinence   3. Incomplete bladder emptying   4. Detrusor instability     Timothy [name] returns today in f/u from urodynamics for consideration of cystoscopy.   He continues to have significant LUTS on tamsulosin.  UA is clear today.  His IPSS is 19.  UDS results:  He had a Max capacity of 470ml with a first sensation at 184ml and a strong urge at 373ml with instability.   He generated a detrusor contraction but couldn't void with the tube in place.  He voided 342ml with a PF of 28ml/sec and a PVR of 123ml after the line was removed.  He had some increased EMG activity with voiding.   Timothy [name] is a 79 yo male who is sent in consultation by Dr. Lajuana Ripple for BPH with LUTS.  He was previously seen by Dr. Pilar Jarvis in 2018 for incontinence.  He continues to have issues with incontinence.  When he gets the urge he will leak.  He has some insensible incontinence as well and wears depends.  He doesn't think he has SUI.  He has an adequate stream and generally feels he empties well.  He has no intermittency.  He has minimal nocturia but only sleeps about 5 hours nightly.    He has no hematuria or dysuria.  He has had no GU surgery.  He has had one prior UTI a couple of months ago.  He has no history of stones.   He is currently on tamsulosin.  His PSA was 0.2 on 06/27/20 and that is stable for the last 6 years.  His last testosterone level was from 9/14 and was 488.  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  He had a CT in 2019 that showed no renal issues.  The prostate was not very enlarged with a transverse diameter of 4.2cm. His PVR today is 265ml.    ROS:  ROS  Allergies  Allergen Reactions  . Hydrocodone-Acetaminophen Other (See Comments)    Makes pt. Feel loopy    Past Medical History:  Diagnosis Date  . 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 BCC (basal cell carcinoma) 11/15/2004   LEFT CHEST- TX CURET X 3, 5FU  . BCC (basal cell carcinoma) 11/15/2004   RIGHT EAR- TX CURET X 3, EXCISION  . BCC (basal cell carcinoma) 08/13/2005   RIGHT EAR- TX MOHS  . BCC (basal cell carcinoma) 04/01/2008   SUPERFICIAL- UPPER LEFT BACK- TX CURET X 3, 5FU  . BCC (basal cell carcinoma) 12/23/2012   ULCERATED- ABOVE RIGHT LIP- TX MOHS  . Cataract   . Chronic kidney disease   . Colon polyp   . Coronary artery disease    (left main normal, LAD 25-30% stenosis, distal 30-40% stenosis, circumflex obtuse marginal 50% stenosis,  right coronary artery dominant with long 75% followed by mid 80%  stenosis followed by 90% stenosis at the ostium of the PDA.  He had  stenting of the PDA by Dr. [name].  This was a Cypher stent.   First angioplasty was 1994, Cath 2011 100% RCA ).      . Diabetes mellitus without complication ([place])   . Essential hypertension, benign   . Glaucoma   . Hyperlipidemia   . Hyperplasia of prostate   . 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 Melanoma ([place]) 12/23/2012   IN SITU- LEFT SIDEBURN-TX MOHS  . Melanoma ([place]) 02/27/2017   LEFT CHEEK- TX MOHS  . Myocardial infarction ([place])    per pt/ had mild heart attacks/has 3 stents  . OSA (obstructive sleep apnea) 08/17/2016  . 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 SCC (squamous cell carcinoma) 10/14/2019   WELL DIFF- RIGHT SHOULDER-TX CURET X 3, 5FU  . SCC (squamous cell carcinoma) 01/24/2019   WELL DIFF- LEFT INNER CHEEK- MOHS  . 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   LEFT FOREHEAD-TX CURET X 3, 5FU  . SCCA (squamous cell carcinoma) of skin 04/01/2008   RIGHT HAND- TX CURET X 3, 5FU  . SCCA (squamous cell  carcinoma) of skin 06/02/2013   LEFT SIDE OF FACE- TX CURET AFTER BIOPSY  . Sleep apnea    no c-pap    Past Surgical History:  Procedure Laterality Date  . APPENDECTOMY    . CERVICAL SPINE SURGERY    . CHOLECYSTECTOMY    . COLONOSCOPY  2004   multiple since  . CORONARY ANGIOPLASTY WITH STENT PLACEMENT     3 sents  . EYE SURGERY Bilateral    cataracts  . HERNIA REPAIR Bilateral   . SKIN CANCER EXCISION  03/17/2017  . SKIN CANCER EXCISION  03/17/2020   left side of face    Social History   Socioeconomic History  . Marital status: Married    Spouse name: Not on file  . Number of children: 2  . Years of education: Not on file  . Highest education level: Not on file  Occupational History  . Occupation: retired  Tobacco Use  . Smoking status: Former Smoker    Packs/day: 2.00    Years: 25.00    Pack years: 50.00    Types: Cigarettes    Quit date: 08/14/1991    Years since quitting: 29.2  . Smokeless tobacco: Never Used  Vaping Use  . Vaping Use: Never used  Substance and Sexual Activity  . Alcohol use: Yes    Comment: rare  . Drug use: No  . Sexual activity: Yes  Other Topics Concern  . Not on file  Social History Narrative  . Not on file   Social Determinants of Health   Financial Resource Strain:   . Difficulty of Paying Living Expenses: Not on file  Food Insecurity:   . Worried About Charity fundraiser in the Last Year: Not on file  . Ran Out of Food in the Last Year: Not on file  Transportation Needs:   . Lack of Transportation (Medical): Not on file  . Lack of Transportation (Non-Medical): Not on file  Physical Activity:   . Days of Exercise per Week: Not on file  . Minutes of Exercise per Session: Not on file  Stress:   . Feeling of Stress : Not on file  Social Connections:   . Frequency of Communication with Friends and Family: Not on file  . Frequency of Social Gatherings with Friends and Family: Not on file  . Attends Religious Services: Not  on file  . Active Member of Clubs or Organizations: Not on file  . Attends Archivist  Meetings: Not on file  . Marital Status: Not on file  Intimate Partner Violence:   . Fear of Current or Ex-Partner: Not on file  . Emotionally Abused: Not on file  . Physically Abused: Not on file  . Sexually Abused: Not on file    Family History  Problem Relation Age of Onset  . Cancer Mother        unsure of type  . Glaucoma Mother   . Heart disease Father   . Coronary artery disease Father   . Cancer Sister 8       granlocytosis (Wergerner's)  . Colon cancer Brother 81  . Nephritis Sister        20's  . Cancer Brother        lung     Anti-infectives: Anti-infectives (From admission, onward)   Start     Dose/Rate Route Frequency Ordered Stop   10/28/20 0915  CIPROFLOXACIN HCL 500 MG PO TABS        500 mg Oral  Once 10/28/20 0900        Current Outpatient Medications  Medication Sig Dispense Refill  . amLODipine (NORVASC) 10 MG tablet Take 1 tablet (10 mg total) by mouth daily. 90 tablet 3  . aspirin 81 MG EC tablet Take 81 mg by mouth daily.      . carvedilol (COREG) 12.5 MG tablet TAKE  (1)  TABLET TWICE A DAY WITH MEALS (BREAKFAST AND SUPPER) 180 tablet 3  . Choline Fenofibrate (FENOFIBRIC ACID) 135 MG CPDR TAKE (1) CAPSULE DAILY 90 capsule 3  . esomeprazole (NEXIUM) 40 MG capsule Take 1 capsule (40 mg total) by mouth daily. 90 capsule 3  . furosemide (LASIX) 20 MG tablet Take 1/2 tablet (10 mg total) by mouth daily. 45 tablet 3  . glimepiride (AMARYL) 4 MG tablet Take 1 tablet (4 mg total) by mouth in the morning and at bedtime. 180 tablet 3  . isosorbide mononitrate (IMDUR) 60 MG 24 hr tablet Take 1 tablet (60 mg total) by mouth daily. 90 tablet 3  . latanoprost (XALATAN) 0.005 % ophthalmic solution Place 1 drop into both eyes at bedtime.    . nitroGLYCERIN (NITROSTAT) 0.4 MG SL tablet Place 1 tablet (0.4 mg total) under the tongue every 5 (five) minutes as needed. 25  tablet 4  . rosuvastatin (CRESTOR) 20 MG tablet Take 1 tablet (20 mg total) by mouth daily. 90 tablet 3  . sitaGLIPtin (JANUVIA) 25 MG tablet Take 1 tablet (25 mg total) by mouth daily. For diabetes 90 tablet 3  . enalapril (VASOTEC) 2.5 MG tablet Take 1 tablet (2.5 mg total) by mouth daily. 90 tablet 3  . icosapent Ethyl (VASCEPA) 1 g capsule Take 2 capsules (2 g total) by mouth 2 (two) times daily. 360 capsule 3  . silodosin (RAPAFLO) 8 MG CAPS capsule Take 1 capsule (8 mg total) by mouth daily with breakfast. 30 capsule 11   Current Facility-Administered Medications  Medication Dose Route Frequency Provider Last Rate Last Admin  . ciprofloxacin (CIPRO) tablet 500 mg  500 mg Oral Once Irine Seal, MD         Objective: Vital signs in last 24 hours: BP 131/82   Pulse 77   Temp 97.9 F (36.6 C)   Ht 5\' 7"  (1.702 m)   Wt 193 lb (87.5 kg)   BMI 30.23 kg/m   Intake/Output from previous day: No intake/output data recorded. Intake/Output this shift: @IOTHISSHIFT @   Physical Exam  Lab Results:  No results found for this or any previous visit (from the past 24 hour(s)).  BMET No results for input(s): NA, K, CL, CO2, GLUCOSE, BUN, CREATININE, CALCIUM in the last 72 hours. PT/INR No results for input(s): LABPROT, INR in the last 72 hours. ABG No results for input(s): PHART, HCO3 in the last 72 hours.  Invalid input(s): PCO2, PO2  Studies/Results: Procedure: Cystoscopy with dilation.  He was prepped with betadine and the urethra was instilled with 2% lidocaine jelly.  The initial attempt at scope passage was not successful because of a fossa stricture.  He was dilated to 94fr with [employer] sounds.  The scope was then passed.  The urethra was narrow but without clear stricture.  The prostate was small without hyperplasia and only a slightly high bladder neck.  The bladder was mildly trabeculated with no mucosal lesions.  The UO's were normal.   He was given Cipro for the  procedure.   Assessment/Plan: BPH with BOO and UUI with incomplete emptying. His prostate is small so he may have some DSD with instability.   I am going to change his alpha blocker to silodosin 8mg  daily to see if that will reduce the urgency. If this is not successful, he may benefit from PTNS or an OAB med.   Urethral stricture of the fossa.  This was dilated for scope passage.  This may be contributing to his LUT as well as the stricture was about 46fr.   Meds ordered this encounter  Medications  . ciprofloxacin (CIPRO) tablet 500 mg  . silodosin (RAPAFLO) 8 MG CAPS capsule    Sig: Take 1 capsule (8 mg total) by mouth daily with breakfast.    Dispense:  30 capsule    Refill:  11     Orders Placed This Encounter  Procedures  . Urinalysis, Routine w reflex microscopic     Return in about 6 years (around 10/28/2026) for PVR on return.. 6 weeks not 6 years.   CC: Dr. Adam Phenix.       Irine Seal 10/28/2020 [phone]

## 2020-10-28 ENCOUNTER — Other Ambulatory Visit: Payer: Self-pay

## 2020-10-28 ENCOUNTER — Ambulatory Visit (INDEPENDENT_AMBULATORY_CARE_PROVIDER_SITE_OTHER): Payer: Medicare HMO | Admitting: Urology

## 2020-10-28 VITALS — BP 131/82 | HR 77 | Temp 97.9°F | Ht 67.0 in | Wt 193.0 lb

## 2020-10-28 DIAGNOSIS — N3281 Overactive bladder: Secondary | ICD-10-CM | POA: Diagnosis not present

## 2020-10-28 DIAGNOSIS — R339 Retention of urine, unspecified: Secondary | ICD-10-CM

## 2020-10-28 DIAGNOSIS — N3941 Urge incontinence: Secondary | ICD-10-CM | POA: Diagnosis not present

## 2020-10-28 DIAGNOSIS — N401 Enlarged prostate with lower urinary tract symptoms: Secondary | ICD-10-CM | POA: Diagnosis not present

## 2020-10-28 DIAGNOSIS — N138 Other obstructive and reflux uropathy: Secondary | ICD-10-CM

## 2020-10-28 LAB — URINALYSIS, ROUTINE W REFLEX MICROSCOPIC
Bilirubin, UA: NEGATIVE
Ketones, UA: NEGATIVE
Leukocytes,UA: NEGATIVE
Nitrite, UA: NEGATIVE
Protein,UA: NEGATIVE
RBC, UA: NEGATIVE
Specific Gravity, UA: 1.02 (ref 1.005–1.030)
Urobilinogen, Ur: 0.2 mg/dL (ref 0.2–1.0)
pH, UA: 5.5 (ref 5.0–7.5)

## 2020-10-28 MED ORDER — CIPROFLOXACIN HCL 500 MG PO TABS
500.0000 mg | ORAL_TABLET | Freq: Once | ORAL | Status: AC
  Administered 2020-10-28: 500 mg via ORAL

## 2020-10-28 MED ORDER — SILODOSIN 8 MG PO CAPS: 8.0000 mg | ORAL_CAPSULE | Freq: Every day | ORAL | 11 refills | Status: DC

## 2020-10-28 NOTE — Progress Notes (Signed)
Urological Symptom Review  Patient is experiencing the following symptoms: Hard to postpone urination Get up at night to urinate Leakage of urine Weak stream   Review of Systems  Gastrointestinal (upper)  : Negative for upper GI symptoms  Gastrointestinal (lower) : Negative for lower GI symptoms  Constitutional : Negative for symptoms  Skin: Itching  Eyes: Negative for eye symptoms  Ear/Nose/Throat : Negative for Ear/Nose/Throat symptoms  Hematologic/Lymphatic: Negative for Hematologic/Lymphatic symptoms  Cardiovascular : Negative for cardiovascular symptoms  Respiratory : Negative for respiratory symptoms  Endocrine: Negative for endocrine symptoms  Musculoskeletal: Back pain  Neurological: Negative for neurological symptoms  Psychologic: Negative for psychiatric symptoms

## 2020-10-31 ENCOUNTER — Other Ambulatory Visit: Payer: Self-pay

## 2020-10-31 DIAGNOSIS — N138 Other obstructive and reflux uropathy: Secondary | ICD-10-CM

## 2020-10-31 DIAGNOSIS — N401 Enlarged prostate with lower urinary tract symptoms: Secondary | ICD-10-CM

## 2020-10-31 MED ORDER — SILODOSIN 8 MG PO CAPS: 8.0000 mg | ORAL_CAPSULE | Freq: Every day | ORAL | 3 refills | Status: DC

## 2020-10-31 NOTE — Progress Notes (Signed)
Pt request 90 day rx instead of 30.

## 2020-11-16 ENCOUNTER — Encounter: Payer: Self-pay | Admitting: Dermatology

## 2020-11-16 ENCOUNTER — Other Ambulatory Visit: Payer: Self-pay

## 2020-11-16 ENCOUNTER — Ambulatory Visit: Payer: Medicare HMO | Admitting: Dermatology

## 2020-11-16 DIAGNOSIS — L821 Other seborrheic keratosis: Secondary | ICD-10-CM

## 2020-11-16 DIAGNOSIS — Z85828 Personal history of other malignant neoplasm of skin: Secondary | ICD-10-CM | POA: Diagnosis not present

## 2020-11-16 DIAGNOSIS — Z85820 Personal history of malignant melanoma of skin: Secondary | ICD-10-CM

## 2020-11-16 DIAGNOSIS — D0439 Carcinoma in situ of skin of other parts of face: Secondary | ICD-10-CM

## 2020-11-16 DIAGNOSIS — D485 Neoplasm of uncertain behavior of skin: Secondary | ICD-10-CM

## 2020-11-16 DIAGNOSIS — C4492 Squamous cell carcinoma of skin, unspecified: Secondary | ICD-10-CM

## 2020-11-16 HISTORY — DX: Squamous cell carcinoma of skin, unspecified: C44.92

## 2020-11-16 NOTE — Patient Instructions (Signed)

## 2020-11-19 ENCOUNTER — Encounter: Payer: Self-pay | Admitting: Dermatology

## 2020-11-20 NOTE — Progress Notes (Signed)
   Follow-Up Visit   Subjective  Timothy [name] is a 79 y.o. male who presents for the following: Skin Problem (spot left side of nose- x months- + itch no bleed).  New crust Location: Left upper nose Duration:  Quality:  Associated Signs/Symptoms: Modifying Factors:  Severity:  Timing: Context: History of skin cancers including melanoma  Objective  Well appearing patient in no apparent distress; mood and affect are within normal limits.  All skin waist up examined.   Assessment & Plan    History of squamous cell carcinoma of skin Neck - Anterior  Yearly skin check  History of basal cell cancer Left Breast  Yearly skin check  Personal history of malignant melanoma of skin Left Sideburn  Yearly skin check  Seborrheic keratosis (2) Right Malar Cheek; Left Postauricular Area  Okay to leave if stable  Neoplasm of uncertain behavior of skin Dorsum of Nose  Skin / nail biopsy Type of biopsy: tangential   Informed consent: discussed and consent obtained   Timeout: patient name, date of birth, surgical site, and procedure verified   Procedure prep:  Patient was prepped and draped in usual sterile fashion (Non sterile) Prep type:  Chlorhexidine Anesthesia: the lesion was anesthetized in a standard fashion   Anesthetic:  1% lidocaine w/ epinephrine 1-100,000 local infiltration Instrument used: flexible razor blade   Outcome: patient tolerated procedure well   Post-procedure details: wound care instructions given    Specimen 1 - Surgical pathology Differential Diagnosis: bcc vs scc Check Margins: No     I, Lavonna Monarch, MD, have reviewed all documentation for this visit.  The documentation on 11/20/20 for the exam, diagnosis, procedures, and orders are all accurate and complete.

## 2020-11-21 ENCOUNTER — Telehealth: Payer: Self-pay

## 2020-11-21 NOTE — Telephone Encounter (Signed)
01/12/21 surgery with Dr Denna Haggard.

## 2020-11-21 NOTE — Telephone Encounter (Signed)
-----   Message from Lavonna Monarch, MD sent at 11/19/2020  8:49 AM EST ----- Schedule surgery with Dr. Darene Lamer

## 2020-12-19 NOTE — Progress Notes (Signed)
Cardiology Office Note   Date:  12/21/2020   ID:  Timothy [name], DOB Sep 10, 1941, MRN [id number]  PCP:  Timothy Norlander, DO  Cardiologist:   Timothy Breeding, MD   Chief Complaint  Patient presents with  . Chest Pain      History of Present Illness: Timothy [name] is a 80 y.o. male who presents for follow up of CAD.   He had a stress perfusion study after the last visit with evidence of infarct but no ischemia.  Since I last saw him he has had no cardiovascular symptoms.  He denies any chest discomfort, neck or arm discomfort.  He has had no shortness of breath, PND or orthopnea.  He said no palpitations, presyncope or syncope.  He has had no weight gain or edema.   I did put him on Imdur 60 mg at the last visit.   Past Medical History:  Diagnosis Date  . 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 BCC (basal cell carcinoma) 11/15/2004   LEFT CHEST- TX CURET X 3, 5FU  . BCC (basal cell carcinoma) 11/15/2004   RIGHT EAR- TX CURET X 3, EXCISION  . BCC (basal cell carcinoma) 08/13/2005   RIGHT EAR- TX MOHS  . BCC (basal cell carcinoma) 04/01/2008   SUPERFICIAL- UPPER LEFT BACK- TX CURET X 3, 5FU  . BCC (basal cell carcinoma) 12/23/2012   ULCERATED- ABOVE RIGHT LIP- TX MOHS  . Cataract   . Chronic kidney disease   . Colon polyp   . Coronary artery disease    (left main normal, LAD 25-30% stenosis, distal 30-40% stenosis, circumflex obtuse marginal 50% stenosis,  right coronary artery dominant with long 75% followed by mid 80%  stenosis followed by 90% stenosis at the ostium of the PDA.  He had  stenting of the PDA by Dr. [name].  This was a Cypher stent.   First angioplasty was 1994, Cath 2011 100% RCA ).      . Diabetes mellitus without complication ([place])   . Essential hypertension, benign   . Glaucoma   . Hyperlipidemia   . Hyperplasia of prostate   . Megaloblastic  anemia due to decreased intake of vitamin B12   . Melanoma ([place]) 12/23/2012   IN SITU- LEFT SIDEBURN-TX MOHS  . Melanoma ([place]) 02/27/2017   LEFT CHEEK- TX MOHS  . Myocardial infarction ([place])    per pt/ had mild heart attacks/has 3 stents  . OSA (obstructive sleep apnea) 08/17/2016  . 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 SCC (squamous cell carcinoma) 10/14/2019   WELL DIFF- RIGHT SHOULDER-TX CURET X 3, 5FU  . SCC (squamous cell carcinoma) 01/24/2019   WELL DIFF- LEFT INNER CHEEK- MOHS  . 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   LEFT FOREHEAD-TX CURET X 3, 5FU  . SCCA (squamous cell carcinoma) of skin 04/01/2008   RIGHT HAND- TX CURET X 3, 5FU  . 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   LEFT SIDE OF FACE- TX CURET AFTER BIOPSY  . Sleep apnea    no c-pap    Past Surgical History:  Procedure Laterality  Date  . APPENDECTOMY    . CERVICAL SPINE SURGERY    . CHOLECYSTECTOMY    . COLONOSCOPY  2004   multiple since  . CORONARY ANGIOPLASTY WITH STENT PLACEMENT     3 sents  . EYE SURGERY Bilateral    cataracts  . HERNIA REPAIR Bilateral   . SKIN CANCER EXCISION  03/17/2017  . SKIN CANCER EXCISION  03/17/2020   left side of face     Current Outpatient Medications  Medication Sig Dispense Refill  . amLODipine (NORVASC) 10 MG tablet Take 1 tablet (10 mg total) by mouth daily. 90 tablet 3  . aspirin 81 MG EC tablet Take 81 mg by mouth daily.    . carvedilol (COREG) 12.5 MG tablet TAKE  (1)  TABLET TWICE A DAY WITH MEALS (BREAKFAST AND SUPPER) 180 tablet 3  . Choline Fenofibrate (FENOFIBRIC ACID) 135 MG CPDR TAKE (1) CAPSULE DAILY 90 capsule 3  . enalapril (VASOTEC) 2.5 MG tablet Take 1 tablet (2.5 mg total) by mouth daily. 90 tablet 3  .  esomeprazole (NEXIUM) 40 MG capsule Take 1 capsule (40 mg total) by mouth daily. 90 capsule 3  . furosemide (LASIX) 20 MG tablet Take 1/2 tablet (10 mg total) by mouth daily. 45 tablet 3  . glimepiride (AMARYL) 4 MG tablet Take 1 tablet (4 mg total) by mouth in the morning and at bedtime. 180 tablet 3  . icosapent Ethyl (VASCEPA) 1 g capsule Take 2 capsules (2 g total) by mouth 2 (two) times daily. 360 capsule 3  . isosorbide mononitrate (IMDUR) 60 MG 24 hr tablet Take 1 tablet (60 mg total) by mouth daily. 90 tablet 3  . latanoprost (XALATAN) 0.005 % ophthalmic solution Place 1 drop into both eyes at bedtime.    . nitroGLYCERIN (NITROSTAT) 0.4 MG SL tablet Place 1 tablet (0.4 mg total) under the tongue every 5 (five) minutes as needed. 25 tablet 4  . rosuvastatin (CRESTOR) 20 MG tablet Take 1 tablet (20 mg total) by mouth daily. 90 tablet 3  . silodosin (RAPAFLO) 8 MG CAPS capsule Take 1 capsule (8 mg total) by mouth daily with breakfast. 90 capsule 3  . sitaGLIPtin (JANUVIA) 25 MG tablet Take 1 tablet (25 mg total) by mouth daily. For diabetes 90 tablet 3   No current facility-administered medications for this visit.    Allergies:   Hydrocodone-acetaminophen     ROS:  Please see the history of present illness.   Otherwise, review of systems are positive for none.   All other systems are reviewed and negative.    PHYSICAL EXAM: VS:  BP (!) 150/80   Pulse 80   Ht 5' 7.5" (1.715 m)   Wt 194 lb (88 kg)   BMI 29.94 kg/m  , BMI Body mass index is 29.94 kg/m. GENERAL:  Well appearing NECK:  No jugular venous distention, waveform within normal limits, carotid upstroke brisk and symmetric, no bruits, no thyromegaly LUNGS:  Clear to auscultation bilaterally CHEST:  Unremarkable HEART:  PMI not displaced or sustained,S1 and S2 within normal limits, no S3, no S4, no clicks, no rubs, no murmurs ABD:  Flat, positive bowel sounds normal in frequency in pitch, no bruits, no rebound, no guarding,  no midline pulsatile mass, no hepatomegaly, no splenomegaly EXT:  2 plus pulses throughout, no edema, no cyanosis no clubbing   EKG:  EKG is not ordered today. NA   Recent Labs: 06/27/2020: ALT 17; BUN 16; Creatinine, Ser 1.49; Hemoglobin 12.7; Platelets  172; Potassium 3.8; Sodium 141; TSH 3.430    Lipid Panel    Component Value Date/Time   CHOL 135 06/27/2020 0814   TRIG 293 (H) 06/27/2020 0814   TRIG 538 (H) 09/22/2016 0806   HDL 24 (L) 06/27/2020 0814   HDL 15 (L) 09/22/2016 0806   CHOLHDL 5.6 (H) 06/27/2020 0814   CHOLHDL 4.9 04/08/2013 0827   VLDL 55 (H) 04/08/2013 0827   LDLCALC 64 06/27/2020 0814   LDLCALC 48 07/16/2014 0818   LDLDIRECT 88 03/01/2020 0000      Wt Readings from Last 3 Encounters:  12/21/20 194 lb (88 kg)  10/28/20 193 lb (87.5 kg)  09/21/20 193 lb (87.5 kg)      Other studies Reviewed: Additional studies/ records that were reviewed today include: Labs. Review of the above records demonstrates:  Please see elsewhere in the note.     ASSESSMENT AND PLAN:  CAD - The patient has no new sypmtoms.  No further cardiovascular testing is indicated.  We will continue with aggressive risk reduction and meds as listed.  He will remain on the Imdur.  HTN -  The blood pressure is elevated today but this is unusual.  No change in therapy.   DYSLIPIDEMIA - LDL was 64.  No change in therapy.   SLEEP APNEA - He could not afford CPAP.  No change in therapy.   CKD STAGE III- Creatinine was 2.06 in the past but the most recent was 1.49 in July.  I will defer to his primary providers to follow.   DM - This is followed by Timothy Norlander, DO.  A1c was 6.7.  No change in therapy.   COVID-19 Education: He has had his vaccinations.  Current medicines are reviewed at length with the patient today.  The patient does not have concerns regarding medicines.  The following changes have been made:  no change  Labs/ tests ordered today include:   No  orders of the defined types were placed in this encounter.    Disposition:   FU with me in 4 months   Signed, Timothy Breeding, MD  12/21/2020 1:01 PM    [facility]

## 2020-12-21 ENCOUNTER — Encounter: Payer: Self-pay | Admitting: Cardiology

## 2020-12-21 ENCOUNTER — Ambulatory Visit: Payer: Medicare HMO | Admitting: Cardiology

## 2020-12-21 ENCOUNTER — Other Ambulatory Visit: Payer: Self-pay

## 2020-12-21 VITALS — BP 150/80 | HR 80 | Ht 67.5 in | Wt 194.0 lb

## 2020-12-21 DIAGNOSIS — I10 Essential (primary) hypertension: Secondary | ICD-10-CM

## 2020-12-21 DIAGNOSIS — I2510 Atherosclerotic heart disease of native coronary artery without angina pectoris: Secondary | ICD-10-CM | POA: Diagnosis not present

## 2020-12-21 DIAGNOSIS — N1831 Chronic kidney disease, stage 3a: Secondary | ICD-10-CM | POA: Diagnosis not present

## 2020-12-21 DIAGNOSIS — E785 Hyperlipidemia, unspecified: Secondary | ICD-10-CM

## 2020-12-21 NOTE — Patient Instructions (Signed)
Medication Instructions:  The current medical regimen is effective;  continue present plan and medications.  *If you need a refill on your cardiac medications before your next appointment, please call your pharmacy*  Follow-Up: At [facility], you and your health needs are our priority.  As part of our continuing mission to provide you with exceptional heart care, we have created designated Provider Care Teams.  These Care Teams include your primary Cardiologist (physician) and Advanced Practice Providers (APPs -  Physician Assistants and Nurse Practitioners) who all work together to provide you with the care you need, when you need it.  We recommend signing up for the patient portal called "MyChart".  Sign up information is provided on this After Visit Summary.  MyChart is used to connect with patients for Virtual Visits (Telemedicine).  Patients are able to view lab/test results, encounter notes, upcoming appointments, etc.  Non-urgent messages can be sent to your provider as well.   To learn more about what you can do with MyChart, go to [email or web address].    Your next appointment:   6 month(s)  The format for your next appointment:   In Person  Provider:   James Hochrein, MD   Thank you for choosing [facility]!!     

## 2020-12-29 ENCOUNTER — Ambulatory Visit (INDEPENDENT_AMBULATORY_CARE_PROVIDER_SITE_OTHER): Payer: Medicare HMO | Admitting: Urology

## 2020-12-29 ENCOUNTER — Other Ambulatory Visit: Payer: Self-pay

## 2020-12-29 ENCOUNTER — Encounter: Payer: Self-pay | Admitting: Urology

## 2020-12-29 VITALS — BP 130/65 | HR 79 | Temp 98.0°F | Ht 67.5 in | Wt 190.0 lb

## 2020-12-29 DIAGNOSIS — R339 Retention of urine, unspecified: Secondary | ICD-10-CM

## 2020-12-29 DIAGNOSIS — N3941 Urge incontinence: Secondary | ICD-10-CM | POA: Diagnosis not present

## 2020-12-29 DIAGNOSIS — N3281 Overactive bladder: Secondary | ICD-10-CM

## 2020-12-29 DIAGNOSIS — N401 Enlarged prostate with lower urinary tract symptoms: Secondary | ICD-10-CM | POA: Diagnosis not present

## 2020-12-29 DIAGNOSIS — N138 Other obstructive and reflux uropathy: Secondary | ICD-10-CM

## 2020-12-29 LAB — URINALYSIS, ROUTINE W REFLEX MICROSCOPIC
Bilirubin, UA: NEGATIVE
Ketones, UA: NEGATIVE
Leukocytes,UA: NEGATIVE
Nitrite, UA: NEGATIVE
Protein,UA: NEGATIVE
Specific Gravity, UA: 1.015 (ref 1.005–1.030)
Urobilinogen, Ur: 0.2 mg/dL (ref 0.2–1.0)
pH, UA: 5 (ref 5.0–7.5)

## 2020-12-29 LAB — MICROSCOPIC EXAMINATION
Bacteria, UA: NONE SEEN
Epithelial Cells (non renal): NONE SEEN /hpf (ref 0–10)
Renal Epithel, UA: NONE SEEN /hpf
WBC, UA: NONE SEEN /hpf (ref 0–5)

## 2020-12-29 LAB — BLADDER SCAN AMB NON-IMAGING: Scan Result: 37

## 2020-12-29 NOTE — Progress Notes (Signed)
  PVR 37  Urological Symptom Review  Patient is experiencing the following symptoms: Frequent urination Hard to postpone urination Get up at night to urinate Leakage of urine   Review of Systems  Gastrointestinal (upper)  : Negative for upper GI symptoms  Gastrointestinal (lower) : Negative for lower GI symptoms  Constitutional : Negative for symptoms  Skin: Itching  Eyes: Negative for eye symptoms  Ear/Nose/Throat : Negative for Ear/Nose/Throat symptoms  Hematologic/Lymphatic: Negative for Hematologic/Lymphatic symptoms  Cardiovascular : Negative for cardiovascular symptoms  Respiratory : Negative for respiratory symptoms  Endocrine: Negative for endocrine symptoms  Musculoskeletal: Negative for musculoskeletal symptoms  Neurological: Negative for neurological symptoms  Psychologic: Negative for psychiatric symptoms

## 2020-12-29 NOTE — Progress Notes (Signed)
Subjective: 1. BPH with urinary obstruction   2. Urge incontinence   3. Incomplete bladder emptying   4. Detrusor instability     12/29/20: Timothy [name] returns today in f/u to assess his response the silodosin.  He is voiding better with reduce incontinence and has nocturia x 1.  His PVR today is 12ml.  He reports a good stream most of the time since the dilation of the fossa stricture.  His UA is unremarkable today.   His IPSS is 20, but his history is not consistent with the score.  He feels he empties well.  His daytime frequency is more often than every 2 hours.  He has still has some urgency.    10/28/20: Timothy [name] returns today in f/u from urodynamics for consideration of cystoscopy.   He continues to have significant LUTS on tamsulosin.  UA is clear today.  His IPSS is 19.  UDS results:  He had a Max capacity of 423ml with a first sensation at 133ml and a strong urge at 369ml with instability.   He generated a detrusor contraction but couldn't void with the tube in place.  He voided 336ml with a PF of 55ml/sec and a PVR of 189ml after the line was removed.  He had some increased EMG activity with voiding.   Timothy [name] is a 80 yo male who is sent in consultation by Dr. Lajuana Ripple for BPH with LUTS.  He was previously seen by Dr. Pilar Jarvis in 2018 for incontinence.  He continues to have issues with incontinence.  When he gets the urge he will leak.  He has some insensible incontinence as well and wears depends.  He doesn't think he has SUI.  He has an adequate stream and generally feels he empties well.  He has no intermittency.  He has minimal nocturia but only sleeps about 5 hours nightly.    He has no hematuria or dysuria.  He has had no GU surgery.  He has had one prior UTI a couple of months ago.  He has no history of stones.   He is currently on tamsulosin.  His PSA was 0.2 on 06/27/20 and that is stable for the last 6 years.  His last testosterone level was from 9/14 and was 488.  He has CKD3 with a  Cr of 1.49 in 7/21.  He had a CT in 2019 that showed no renal issues.  The prostate was not very enlarged with a transverse diameter of 4.2cm. His PVR today is 243ml.    ROS:  ROS  Allergies  Allergen Reactions  . Hydrocodone-Acetaminophen Other (See Comments)    Makes pt. Feel loopy    Past Medical History:  Diagnosis Date  . 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 BCC (basal cell carcinoma) 11/15/2004   LEFT CHEST- TX CURET X 3, 5FU  . BCC (basal cell carcinoma) 11/15/2004   RIGHT EAR- TX CURET X 3, EXCISION  . BCC (basal cell carcinoma) 08/13/2005   RIGHT EAR- TX MOHS  . BCC (basal cell carcinoma) 04/01/2008   SUPERFICIAL- UPPER LEFT BACK- TX CURET X 3, 5FU  . BCC (basal cell carcinoma) 12/23/2012   ULCERATED- ABOVE RIGHT LIP- TX MOHS  . Cataract   . Chronic kidney disease   . Colon polyp   . Coronary artery disease    (left main normal, LAD 25-30% stenosis, distal 30-40%  stenosis, circumflex obtuse marginal 50% stenosis,  right coronary artery dominant with long 75% followed by mid 80%  stenosis followed by 90% stenosis at the ostium of the PDA.  He had  stenting of the PDA by Dr. [name].  This was a Cypher stent.   First angioplasty was 1994, Cath 2011 100% RCA ).      . Diabetes mellitus without complication ([place])   . Essential hypertension, benign   . Glaucoma   . Hyperlipidemia   . Hyperplasia of prostate   . 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 Melanoma ([place]) 12/23/2012   IN SITU- LEFT SIDEBURN-TX MOHS  . Melanoma ([place]) 02/27/2017   LEFT CHEEK- TX MOHS  . Myocardial infarction ([place])    per pt/ had mild heart attacks/has 3 stents  . OSA (obstructive sleep apnea) 08/17/2016  . 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  SCC (squamous cell carcinoma) 10/14/2019   WELL DIFF- RIGHT SHOULDER-TX CURET X 3, 5FU  . SCC (squamous cell carcinoma) 01/24/2019   WELL DIFF- LEFT INNER CHEEK- MOHS  . 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   LEFT FOREHEAD-TX CURET X 3, 5FU  . SCCA (squamous cell carcinoma) of skin 04/01/2008   RIGHT HAND- TX CURET X 3, 5FU  . 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   LEFT SIDE OF FACE- TX CURET AFTER BIOPSY  . Sleep apnea    no c-pap    Past Surgical History:  Procedure Laterality Date  . APPENDECTOMY    . CERVICAL SPINE SURGERY    . CHOLECYSTECTOMY    . COLONOSCOPY  2004   multiple since  . CORONARY ANGIOPLASTY WITH STENT PLACEMENT     3 sents  . EYE SURGERY Bilateral    cataracts  . HERNIA REPAIR Bilateral   . SKIN CANCER EXCISION  03/17/2017  . SKIN CANCER EXCISION  03/17/2020   left side of face    Social History   Socioeconomic History  . Marital status: Married    Spouse name: Not on file  . Number of children: 2  . Years of education: Not on file  . Highest education level: Not on file  Occupational History  . Occupation: retired  Tobacco Use  . Smoking status: Former Smoker    Packs/day: 2.00    Years: 25.00    Pack years: 50.00    Types: Cigarettes    Quit date: 08/14/1991    Years since quitting: 29.3  . Smokeless tobacco: Never Used  Vaping Use  . Vaping Use: Never used  Substance and Sexual Activity  . Alcohol use: Yes    Comment: rare  . Drug use: No  . Sexual activity: Yes  Other Topics Concern  . Not on file  Social History Narrative  . Not on file   Social Determinants of Health   Financial Resource Strain: Not on file  Food Insecurity: Not on file  Transportation Needs: Not on file  Physical Activity: Not on file  Stress: Not on file  Social Connections: Not on file  Intimate Partner Violence: Not on  file    Family History  Problem Relation Age of Onset  . Cancer Mother  unsure of type  . Glaucoma Mother   . Heart disease Father   . Coronary artery disease Father   . Cancer Sister 73       granlocytosis (Wergerner's)  . Colon cancer Brother 76  . Nephritis Sister        20's  . Cancer Brother        lung     Anti-infectives: Anti-infectives (From admission, onward)   None      Current Outpatient Medications  Medication Sig Dispense Refill  . amLODipine (NORVASC) 10 MG tablet Take 1 tablet (10 mg total) by mouth daily. 90 tablet 3  . aspirin 81 MG EC tablet Take 81 mg by mouth daily.    . carvedilol (COREG) 12.5 MG tablet TAKE  (1)  TABLET TWICE A DAY WITH MEALS (BREAKFAST AND SUPPER) 180 tablet 3  . Choline Fenofibrate (FENOFIBRIC ACID) 135 MG CPDR TAKE (1) CAPSULE DAILY 90 capsule 3  . esomeprazole (NEXIUM) 40 MG capsule Take 1 capsule (40 mg total) by mouth daily. 90 capsule 3  . furosemide (LASIX) 20 MG tablet Take 1/2 tablet (10 mg total) by mouth daily. 45 tablet 3  . glimepiride (AMARYL) 4 MG tablet Take 1 tablet (4 mg total) by mouth in the morning and at bedtime. 180 tablet 3  . isosorbide mononitrate (IMDUR) 60 MG 24 hr tablet Take 1 tablet (60 mg total) by mouth daily. 90 tablet 3  . latanoprost (XALATAN) 0.005 % ophthalmic solution Place 1 drop into both eyes at bedtime.    . nitroGLYCERIN (NITROSTAT) 0.4 MG SL tablet Place 1 tablet (0.4 mg total) under the tongue every 5 (five) minutes as needed. 25 tablet 4  . rosuvastatin (CRESTOR) 20 MG tablet Take 1 tablet (20 mg total) by mouth daily. 90 tablet 3  . silodosin (RAPAFLO) 8 MG CAPS capsule Take 1 capsule (8 mg total) by mouth daily with breakfast. 90 capsule 3  . sitaGLIPtin (JANUVIA) 25 MG tablet Take 1 tablet (25 mg total) by mouth daily. For diabetes 90 tablet 3  . enalapril (VASOTEC) 2.5 MG tablet Take 1 tablet (2.5 mg total) by mouth daily. 90 tablet 3  . icosapent Ethyl (VASCEPA) 1 g capsule Take  2 capsules (2 g total) by mouth 2 (two) times daily. 360 capsule 3   No current facility-administered medications for this visit.     Objective: Vital signs in last 24 hours: BP 130/65   Pulse 79   Temp 98 F (36.7 C)   Ht 5' 7.5" (1.715 m)   Wt 190 lb (86.2 kg)   BMI 29.32 kg/m   Intake/Output from previous day: No intake/output data recorded. Intake/Output this shift: @IOTHISSHIFT @   Physical Exam  Lab Results:  No results found for this or any previous visit (from the past 24 hour(s)).  BMET No results for input(s): NA, K, CL, CO2, GLUCOSE, BUN, CREATININE, CALCIUM in the last 72 hours. PT/INR No results for input(s): LABPROT, INR in the last 72 hours. ABG No results for input(s): PHART, HCO3 in the last 72 hours.  Invalid input(s): PCO2, PO2  Studies/Results: UA has 0-2 RBC's.   Assessment/Plan: BPH with BOO and UUI with incomplete emptying.  He is doing somewhat better on silodosin but still has frequency and urgency.   He is not interested in additional meds or PTNS at this time.   He will return in 6 months for a flowrate and PVR.   Urethral stricture of the fossa.  This was dilated  for scope passage.  He has a reasonable stream.   No orders of the defined types were placed in this encounter.    Orders Placed This Encounter  Procedures  . Urinalysis, Routine w reflex microscopic  . Bladder Scan (Post Void Residual) in office     Return in about 6 months (around 06/28/2021) for 6 month with flow rate and PVR.Marland Kitchen 6 weeks not 6 years.   CC: Dr. Adam Phenix.       Irine Seal 12/29/2020 [phone]

## 2021-01-11 ENCOUNTER — Other Ambulatory Visit: Payer: Self-pay | Admitting: Internal Medicine

## 2021-01-11 DIAGNOSIS — E785 Hyperlipidemia, unspecified: Secondary | ICD-10-CM

## 2021-01-12 ENCOUNTER — Other Ambulatory Visit: Payer: Self-pay

## 2021-01-12 ENCOUNTER — Ambulatory Visit (INDEPENDENT_AMBULATORY_CARE_PROVIDER_SITE_OTHER): Payer: Medicare HMO | Admitting: Dermatology

## 2021-01-12 ENCOUNTER — Encounter: Payer: Self-pay | Admitting: Dermatology

## 2021-01-12 DIAGNOSIS — D044 Carcinoma in situ of skin of scalp and neck: Secondary | ICD-10-CM | POA: Diagnosis not present

## 2021-01-12 DIAGNOSIS — D0439 Carcinoma in situ of skin of other parts of face: Secondary | ICD-10-CM | POA: Diagnosis not present

## 2021-01-12 DIAGNOSIS — D485 Neoplasm of uncertain behavior of skin: Secondary | ICD-10-CM

## 2021-01-12 DIAGNOSIS — C4492 Squamous cell carcinoma of skin, unspecified: Secondary | ICD-10-CM

## 2021-01-21 ENCOUNTER — Encounter: Payer: Self-pay | Admitting: Dermatology

## 2021-01-21 NOTE — Progress Notes (Signed)
   Follow-Up Visit   Subjective  Timothy [name] is a 80 y.o. male who presents for the following: Procedure (Here for treatment of dorsum of nose- cis x 1).  CIS Location: Nose Duration:  Quality:  Associated Signs/Symptoms: Modifying Factors:  Severity:  Timing: Context: Also enlarging crust right neck  Objective  Well appearing patient in no apparent distress; mood and affect are within normal limits. Objective  Neck - Anterior: 1 cm pink crust     Dorsum of Nose  Objective  Dorsum of Nose: Biopsy site identified by nurse, patient, and me.   A focused examination was performed including Head and neck.. Relevant physical exam findings are noted in the Assessment and Plan.   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  Skin / nail biopsy Type of biopsy: tangential   Informed consent: discussed and consent obtained   Timeout: patient name, date of birth, surgical site, and procedure verified   Procedure prep:  Patient was prepped and draped in usual sterile fashion (Non sterile) Prep type:  Chlorhexidine Anesthesia: the lesion was anesthetized in a standard fashion   Anesthetic:  1% lidocaine w/ epinephrine 1-100,000 local infiltration Instrument used: flexible razor blade   Outcome: patient tolerated procedure well   Post-procedure details: wound care instructions given    Destruction of lesion Complexity: simple   Destruction method: electrodesiccation and curettage   Informed consent: discussed and consent obtained   Timeout:  patient name, date of birth, surgical site, and procedure verified Anesthesia: the lesion was anesthetized in a standard fashion   Anesthetic:  1% lidocaine w/ epinephrine 1-100,000 local infiltration Curettage performed in three different directions: Yes   Curettage cycles:  3 Lesion length (cm):  1.1 Lesion width (cm):  1.1 Margin per side (cm):  0 Final wound size (cm):  1.1 Hemostasis  achieved with:  aluminum chloride Outcome: patient tolerated procedure well with no complications   Post-procedure details: wound care instructions given    Specimen 1 - Surgical pathology Differential Diagnosis: bcc vs scc  Check Margins: No  Carcinoma in situ of skin of other part of face Dorsum of Nose  Destruction of lesion - Dorsum of Nose Complexity: simple   Destruction method: electrodesiccation and curettage   Informed consent: discussed and consent obtained   Timeout:  patient name, date of birth, surgical site, and procedure verified Anesthesia: the lesion was anesthetized in a standard fashion   Anesthetic:  1% lidocaine w/ epinephrine 1-100,000 local infiltration Curettage performed in three different directions: Yes   Curettage cycles:  3 Lesion length (cm):  0.8 Lesion width (cm):  0.8 Margin per side (cm):  0 Final wound size (cm):  0.8 Hemostasis achieved with:  aluminum chloride and ferric subsulfate Outcome: patient tolerated procedure well with no complications   Post-procedure details: wound care instructions given   Additional details:  Wound innoculated with 5 fluorouracil solution.     I, Lavonna Monarch, MD, have reviewed all documentation for this visit.  The documentation on 01/21/21 for the exam, diagnosis, procedures, and orders are all accurate and complete.

## 2021-01-31 ENCOUNTER — Other Ambulatory Visit: Payer: Self-pay

## 2021-01-31 ENCOUNTER — Other Ambulatory Visit: Payer: Medicare HMO

## 2021-02-01 LAB — LIPID PANEL
Chol/HDL Ratio: 6.3 ratio — ABNORMAL HIGH (ref 0.0–5.0)
Cholesterol, Total: 133 mg/dL (ref 100–199)
HDL: 21 mg/dL — ABNORMAL LOW (ref >39)
LDL Chol Calc (NIH): 52 mg/dL (ref 0–99)
Triglycerides: 398 mg/dL — ABNORMAL HIGH (ref 0–149)
VLDL Cholesterol Cal: 60 mg/dL — ABNORMAL HIGH (ref 5–40)

## 2021-03-14 ENCOUNTER — Other Ambulatory Visit: Payer: Self-pay

## 2021-03-14 ENCOUNTER — Encounter: Payer: Self-pay | Admitting: Internal Medicine

## 2021-03-14 ENCOUNTER — Ambulatory Visit: Payer: Medicare HMO | Admitting: Internal Medicine

## 2021-03-14 VITALS — BP 130/72 | HR 76 | Ht 67.0 in | Wt 191.6 lb

## 2021-03-14 DIAGNOSIS — E781 Pure hyperglyceridemia: Secondary | ICD-10-CM

## 2021-03-14 DIAGNOSIS — I2510 Atherosclerotic heart disease of native coronary artery without angina pectoris: Secondary | ICD-10-CM

## 2021-03-14 DIAGNOSIS — E785 Hyperlipidemia, unspecified: Secondary | ICD-10-CM | POA: Diagnosis not present

## 2021-03-14 NOTE — Progress Notes (Signed)
OFFICE CONSULT NOTE  Chief Complaint: Follow-up dyslipidemia  Primary Care Physician: Timothy Norlander, DO  HPI:  Timothy [name] is a 80 y.o. male who is being seen today for the evaluation of dyslipidemia at the request of Timothy File, MD. This is a pleasant 80 year old male patient of Dr. Percival [name] who sees Dr. Lynnette [name] his primary care provider.  He is referred for evaluation and management of dyslipidemia.  He has a strong history of coronary artery disease with multiple heart catheterizations and prior coronary intervention, dating back to 24.  He also has chronic kidney disease, hypertension and type 2 diabetes.  He had significant dyslipidemia with primary elevated LDL, triglycerides and low HDL less than 20.  This is a typical diabetic or metabolic dyslipidemia.  Recent labs were 5 months ago indicating a total cholesterol 147, triglycerides 510, HDL 18 and LDL was not calculated.  He also had a lipoprotein NMR 2 years ago which showed LDL particle number of 1229 and triglycerides again over 500 with a high small LDL particle number of 1086.  Mr. Timothy [name] has a history of intolerance to atorvastatin which caused hot flashes.  He is currently on rosuvastatin 10 mg but has not tried higher doses.  He also is written to take fenofibrate 135 mg daily and Vascepa 2 g twice daily.  After much discussion it seems that he is not regularly taking the Vascepa.  He said initially was that they are larger capsules and did not fit in his pillbox therefore he has a separate container but often forgets to take the medication.  Review of filled prescriptions show that he did not get a scription filled for Vascepa since 2017.  We do know that he was getting some samples from his primary care provider however the suggest that compliance with the medication is not where it needs to be.  He does report somewhat of an atherogenic diet and denies any routine alcohol use.  With regards to A1c, this was most  recently 6.8, from 6.110 months ago.  01/08/2019  Mr. Timothy [name] is seen today in follow-up.  He reports compliance with Vascepa 2 g twice daily.  He says he may have only missed about 5 doses over the past several months.  I suspect this will make a big difference in his numbers however unfortunately he did not have his fasting lipid profile obtained.  Also he is nonfasting today.  This could significantly affect his lipid profile.  We will need to repeat the lipids as previously recommended.  Overall he is tolerating medication.  03/04/2020  Mr. Timothy [name] returns for annual follow-up.  I am pleased to report his lipids are even better.  His total cholesterol now is 148, triglycerides 247 (down from over 500), HDL 25 and LDL 82.  There is been a slight uptake in his LDL cholesterol.  He is on 10 mg of rosuvastatin.  03/14/2021  Mr. Timothy [name] is seen today in follow-up.  He seems to be doing well.  He saw Dr. Percival [name] in January and seemed to be doing well from a cardiac standpoint.  He had repeat lipids showed total cholesterol 133, triglycerides 398, HDL 21 and LDL 52.  He reports compliance with the Vascepa, fenofibrate and rosuvastatin 20 mg daily.  Overall seems to be at target on this medication regimen.  Although his triglycerides are not "normal", he is still receiving benefit from the [place].   PMHx:  Past Medical History:  Diagnosis Date  . BCC (basal  cell carcinoma) 08/05/1997   SUPERFICIAL- LEFT CHEST- NO TX  . 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 BCC (basal cell carcinoma) 11/15/2004   LEFT CHEST- TX CURET X 3, 5FU  . BCC (basal cell carcinoma) 11/15/2004   RIGHT EAR- TX CURET X 3, EXCISION  . BCC (basal cell carcinoma) 08/13/2005   RIGHT EAR- TX MOHS  . BCC (basal cell carcinoma) 04/01/2008   SUPERFICIAL- UPPER LEFT BACK- TX CURET X 3, 5FU  . BCC (basal cell carcinoma) 12/23/2012   ULCERATED- ABOVE RIGHT LIP- TX MOHS  . Cataract   . Chronic kidney  disease   . Colon polyp   . Coronary artery disease    (left main normal, LAD 25-30% stenosis, distal 30-40% stenosis, circumflex obtuse marginal 50% stenosis,  right coronary artery dominant with long 75% followed by mid 80%  stenosis followed by 90% stenosis at the ostium of the PDA.  He had  stenting of the PDA by Dr. [name].  This was a Cypher stent.   First angioplasty was 1994, Cath 2011 100% RCA ).      . Diabetes mellitus without complication ([place])   . Essential hypertension, benign   . Glaucoma   . Hyperlipidemia   . Hyperplasia of prostate   . 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 Melanoma ([place]) 12/23/2012   IN SITU- LEFT SIDEBURN-TX MOHS  . Melanoma ([place]) 02/27/2017   LEFT CHEEK- TX MOHS  . Myocardial infarction ([place])    per pt/ had mild heart attacks/has 3 stents  . OSA (obstructive sleep apnea) 08/17/2016  . 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 SCC (squamous cell carcinoma) 10/14/2019   WELL DIFF- RIGHT SHOULDER-TX CURET X 3, 5FU  . SCC (squamous cell carcinoma) 01/24/2019   WELL DIFF- LEFT INNER CHEEK- MOHS  . 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   LEFT FOREHEAD-TX CURET X 3, 5FU  . SCCA (squamous cell carcinoma) of skin 04/01/2008   RIGHT HAND- TX CURET X 3, 5FU  . 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   LEFT SIDE OF FACE- TX CURET AFTER BIOPSY  . Sleep apnea    no c-pap  . 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   in situ- dorsum of nose (CX35FU)    Past Surgical History:  Procedure Laterality Date  . APPENDECTOMY    . CERVICAL SPINE SURGERY    . CHOLECYSTECTOMY    . COLONOSCOPY  2004   multiple since  . CORONARY ANGIOPLASTY WITH STENT PLACEMENT     3 sents  . EYE SURGERY  Bilateral    cataracts  . HERNIA REPAIR Bilateral   . SKIN CANCER EXCISION  03/17/2017  . SKIN CANCER EXCISION  03/17/2020   left side of face    FAMHx:  Family History  Problem Relation Age of Onset  . Cancer Mother        unsure of type  . Glaucoma Mother   . Heart disease Father   . Coronary artery disease Father   . Cancer Sister 34       granlocytosis (Wergerner's)  . Colon cancer Brother 13  . Nephritis Sister  20's  . Cancer Brother        lung     SOCHx:   reports that he quit smoking about 29 years ago. His smoking use included cigarettes. He has a 50.00 pack-year smoking history. He has never used smokeless tobacco. He reports current alcohol use. He reports that he does not use drugs.  ALLERGIES:  Allergies  Allergen Reactions  . Hydrocodone-Acetaminophen Other (See Comments)    Makes pt. Feel loopy    ROS: Pertinent items noted in HPI and remainder of comprehensive ROS otherwise negative.  HOME MEDS: Current Outpatient Medications on [name] Prior to Visit  Medication Sig Dispense Refill  . amLODipine (NORVASC) 10 MG tablet Take 1 tablet (10 mg total) by mouth daily. 90 tablet 3  . aspirin 81 MG EC tablet Take 81 mg by mouth daily.    . carvedilol (COREG) 12.5 MG tablet TAKE  (1)  TABLET TWICE A DAY WITH MEALS (BREAKFAST AND SUPPER) 180 tablet 3  . Choline Fenofibrate (FENOFIBRIC ACID) 135 MG CPDR TAKE (1) CAPSULE DAILY 90 capsule 3  . esomeprazole (NEXIUM) 40 MG capsule Take 1 capsule (40 mg total) by mouth daily. 90 capsule 3  . furosemide (LASIX) 20 MG tablet Take 1/2 tablet (10 mg total) by mouth daily. 45 tablet 3  . glimepiride (AMARYL) 4 MG tablet Take 1 tablet (4 mg total) by mouth in the morning and at bedtime. 180 tablet 3  . isosorbide mononitrate (IMDUR) 60 MG 24 hr tablet Take 1 tablet (60 mg total) by mouth daily. 90 tablet 3  . latanoprost (XALATAN) 0.005 % ophthalmic solution Place 1 drop into both eyes at bedtime.    . nitroGLYCERIN  (NITROSTAT) 0.4 MG SL tablet Place 1 tablet (0.4 mg total) under the tongue every 5 (five) minutes as needed. 25 tablet 4  . rosuvastatin (CRESTOR) 20 MG tablet Take 1 tablet (20 mg total) by mouth daily. 90 tablet 3  . silodosin (RAPAFLO) 8 MG CAPS capsule Take 1 capsule (8 mg total) by mouth daily with breakfast. 90 capsule 3  . sitaGLIPtin (JANUVIA) 25 MG tablet Take 1 tablet (25 mg total) by mouth daily. For diabetes 90 tablet 3   No current facility-administered medications on [name] prior to visit.    LABS/IMAGING: No results found for this or any previous visit (from the past 48 hour(s)). No results found.  LIPID PANEL:    Component Value Date/Time   CHOL 133 01/31/2021 0823   TRIG 398 (H) 01/31/2021 0823   TRIG 538 (H) 09/22/2016 0806   HDL 21 (L) 01/31/2021 0823   HDL 15 (L) 09/22/2016 0806   CHOLHDL 6.3 (H) 01/31/2021 0823   CHOLHDL 4.9 04/08/2013 0827   VLDL 55 (H) 04/08/2013 0827   LDLCALC 52 01/31/2021 0823   LDLCALC 48 07/16/2014 0818   LDLDIRECT 88 03/01/2020 0000    WEIGHTS: Wt Readings from Last 3 Encounters:  03/14/21 191 lb 9.6 oz (86.9 kg)  12/29/20 190 lb (86.2 kg)  12/21/20 194 lb (88 kg)    VITALS: BP 130/72   Pulse 76   Ht 5\' 7"  (1.702 m)   Wt 191 lb 9.6 oz (86.9 kg)   SpO2 94%   BMI 30.01 kg/m   EXAM: Deferred  EKG: Deferred  ASSESSMENT: 1. Coronary artery disease status post prior PCI 2. Mixed dyslipidemia (elevated LDL and triglycerides, low HDL)  PLAN: 1.   Mr. Manson has had improvement in his dyslipidemia on higher dose statin.  His LDL  is at target.  Triglycerides are improved however not normal per se, but he is deriving benefit from [place] in addition to fenofibrate and his rosuvastatin.  We will provide him information for the health well foundation.  I have encouraged him to apply for this which may cover the cost of his medication that is currently running about $100 every 3 months.  Follow-up annually or sooner as  necessary.  Pixie Casino, MD, [facility], [place] Director of the [facility] Lipid [facility]  Card[facility] Diplomate of the American Board of Clinical Lipidology Attending Cardiologist  Direct Dial: [phone]  Fax: [phone]  Website:  [email or web address] 03/14/2021, 8:36 AM

## 2021-03-14 NOTE — Patient Instructions (Signed)
Medication Instructions:  Your physician recommends that you continue on your current medications as directed. Please refer to the Current Medication list given to you today.   Patient Assistance:  The [employer] foundation offers assistance to help pay for medication copays.  They will cover copays for all cholesterol lowering meds, including statins, fibrates, omega-3 oils, ezetimibe, Repatha, Praluent, Nexletol, Nexlizet.  The cards are usually good for $2,500 or 12 months, whichever comes first. 1. Go to healthwellfoundation.org 2. Click on "Apply Now" 3. Answer questions as to whom is applying (patient or representative) 4. Your disease fund will be "hypercholesterolemia - Medicare access" 5. They will ask questions about finances and which medications you are taking for cholesterol 6. When you submit, the approval is usually within minutes.  You will need to print the card information from the site 7. You will need to show this information to your pharmacy, they will bill your Medicare Part D plan first -then bill Health Well --for the copay.   You can also call them at [phone], although the hold times can be quite long.     *If you need a refill on your cardiac medications before your next appointment, please call your pharmacy*   Lab Work: Your physician recommends that you return for lab work in: 1 year to check cholesterol  If you have labs (blood work) drawn today and your tests are completely normal, you will receive your results only by: Marland Kitchen MyChart Message (if you have MyChart) OR . A paper copy in the mail If you have any lab test that is abnormal or we need to change your treatment, we will call you to review the results.   Testing/Procedures: NONE   Follow-Up: At [facility], you and your health needs are our priority.  As part of our continuing mission to provide you with exceptional heart care, we have created designated Provider Care Teams.  These Care  Teams include your primary Cardiologist (physician) and Advanced Practice Providers (APPs -  Physician Assistants and Nurse Practitioners) who all work together to provide you with the care you need, when you need it.  We recommend signing up for the patient portal called "MyChart".  Sign up information is provided on this After Visit Summary.  MyChart is used to connect with patients for Virtual Visits (Telemedicine).  Patients are able to view lab/test results, encounter notes, upcoming appointments, etc.  Non-urgent messages can be sent to your provider as well.   To learn more about what you can do with MyChart, go to [email or web address].    Your next appointment:   12 month(s) - lipid clinic  The format for your next appointment:   In Person  Provider:   K. Mali Hilty, MD   Other Instructions

## 2021-03-16 ENCOUNTER — Other Ambulatory Visit: Payer: Self-pay

## 2021-03-16 ENCOUNTER — Encounter: Payer: Self-pay | Admitting: Family Medicine

## 2021-03-16 ENCOUNTER — Ambulatory Visit: Payer: Medicare HMO | Admitting: Family Medicine

## 2021-03-16 VITALS — BP 127/68 | HR 81 | Temp 98.0°F | Ht 67.0 in | Wt 192.0 lb

## 2021-03-16 DIAGNOSIS — M546 Pain in thoracic spine: Secondary | ICD-10-CM | POA: Diagnosis not present

## 2021-03-16 DIAGNOSIS — R0789 Other chest pain: Secondary | ICD-10-CM | POA: Diagnosis not present

## 2021-03-16 MED ORDER — PREDNISONE 20 MG PO TABS: 20.0000 mg | ORAL_TABLET | Freq: Every day | ORAL | 0 refills | Status: AC

## 2021-03-16 NOTE — Patient Instructions (Signed)

## 2021-03-16 NOTE — Progress Notes (Signed)
Acute Office Visit  Subjective:    Patient ID: Timothy [name], male    DOB: 1941-05-01, 80 y.o.   MRN: [id number]  Chief Complaint  Patient presents with  . Back Pain    HPI Patient is in today for chest pain x4 days. The reports an achy pain just below his left shoulder blade, on his upper left side, and lateral left chest. The pain is constant with intermittent sharp pains in his upper back. Pain is a 2/10 at rest and 7/10 with sharp pain. The pain is wore when he lays down or turning to the left. Denies pain in the center of his chest. Denies fever, cough, nausea, vomiting, jaw pain, shortness of breath, dizziness, edema, blurry vision, abdominal pain, or diarrhea. He took tylenol a few days ago, he couldn't tell if this helped. He did have two falls (prior to this pain) where he tripped over a dog and one where he missed a step. He denies injury to his left side with these falls and the pain did not start right after the falls. He saw his cardiologist about 2 months ago. He had a stress test about 1 year ago.   Past Medical History:  Diagnosis Date  . 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 BCC (basal cell carcinoma) 11/15/2004   LEFT CHEST- TX CURET X 3, 5FU  . BCC (basal cell carcinoma) 11/15/2004   RIGHT EAR- TX CURET X 3, EXCISION  . BCC (basal cell carcinoma) 08/13/2005   RIGHT EAR- TX MOHS  . BCC (basal cell carcinoma) 04/01/2008   SUPERFICIAL- UPPER LEFT BACK- TX CURET X 3, 5FU  . BCC (basal cell carcinoma) 12/23/2012   ULCERATED- ABOVE RIGHT LIP- TX MOHS  . Cataract   . Chronic kidney disease   . Colon polyp   . Coronary artery disease    (left main normal, LAD 25-30% stenosis, distal 30-40% stenosis, circumflex obtuse marginal 50% stenosis,  right coronary artery dominant with long 75% followed by mid 80%  stenosis followed by 90% stenosis at the ostium of the PDA.   He had  stenting of the PDA by Dr. [name].  This was a Cypher stent.   First angioplasty was 1994, Cath 2011 100% RCA ).      . Diabetes mellitus without complication ([place])   . Essential hypertension, benign   . Glaucoma   . Hyperlipidemia   . Hyperplasia of prostate   . 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 Melanoma ([place]) 12/23/2012   IN SITU- LEFT SIDEBURN-TX MOHS  . Melanoma ([place]) 02/27/2017   LEFT CHEEK- TX MOHS  . Myocardial infarction ([place])    per pt/ had mild heart attacks/has 3 stents  . OSA (obstructive sleep apnea) 08/17/2016  . 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 SCC (squamous cell carcinoma) 10/14/2019   WELL DIFF- RIGHT SHOULDER-TX CURET X 3, 5FU  . SCC (squamous cell carcinoma) 01/24/2019   WELL DIFF- LEFT INNER CHEEK- MOHS  . 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   LEFT FOREHEAD-TX CURET X 3, 5FU  . SCCA (squamous  cell carcinoma) of skin 04/01/2008   RIGHT HAND- TX CURET X 3, 5FU  . 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   LEFT SIDE OF FACE- TX CURET AFTER BIOPSY  . Sleep apnea    no c-pap  . 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   in situ- dorsum of nose (CX35FU)    Past Surgical History:  Procedure Laterality Date  . APPENDECTOMY    . CERVICAL SPINE SURGERY    . CHOLECYSTECTOMY    . COLONOSCOPY  2004   multiple since  . CORONARY ANGIOPLASTY WITH STENT PLACEMENT     3 sents  . EYE SURGERY Bilateral    cataracts  . HERNIA REPAIR Bilateral   . SKIN CANCER EXCISION  03/17/2017  . SKIN CANCER EXCISION  03/17/2020   left side of face    Family History  Problem Relation Age of Onset  . Cancer Mother        unsure of type  . Glaucoma Mother   . Heart disease Father   .  Coronary artery disease Father   . Cancer Sister 82       granlocytosis (Wergerner's)  . Colon cancer Brother 50  . Nephritis Sister        20's  . Cancer Brother        lung     Social History   Socioeconomic History  . Marital status: Married    Spouse name: Not on file  . Number of children: 2  . Years of education: Not on file  . Highest education level: Not on file  Occupational History  . Occupation: retired  Tobacco Use  . Smoking status: Former Smoker    Packs/day: 2.00    Years: 25.00    Pack years: 50.00    Types: Cigarettes    Quit date: 08/14/1991    Years since quitting: 29.6  . Smokeless tobacco: Never Used  Vaping Use  . Vaping Use: Never used  Substance and Sexual Activity  . Alcohol use: Yes    Comment: rare  . Drug use: No  . Sexual activity: Yes  Other Topics Concern  . Not on file  Social History Narrative  . Not on file   Social Determinants of Health   Financial Resource Strain: Not on file  Food Insecurity: Not on file  Transportation Needs: Not on file  Physical Activity: Not on file  Stress: Not on file  Social Connections: Not on file  Intimate Partner Violence: Not on file    Outpatient Medications Prior to Visit  Medication Sig Dispense Refill  . amLODipine (NORVASC) 10 MG tablet Take 1 tablet (10 mg total) by mouth daily. 90 tablet 3  . aspirin 81 MG EC tablet Take 81 mg by mouth daily.    . carvedilol (COREG) 12.5 MG tablet TAKE  (1)  TABLET TWICE A DAY WITH MEALS (BREAKFAST AND SUPPER) 180 tablet 3  . Choline Fenofibrate (FENOFIBRIC ACID) 135 MG CPDR TAKE (1) CAPSULE DAILY 90 capsule 3  . esomeprazole (NEXIUM) 40 MG capsule Take 1 capsule (40 mg total) by mouth daily. 90 capsule 3  . furosemide (LASIX) 20 MG tablet Take 1/2 tablet (10 mg total) by mouth daily. 45 tablet 3  . glimepiride (AMARYL) 4 MG tablet Take 1 tablet (4 mg total) by mouth in the morning and at bedtime. 180 tablet 3  . isosorbide mononitrate (IMDUR) 60 MG  24 hr tablet Take 1 tablet (60 mg total) by mouth daily.  90 tablet 3  . latanoprost (XALATAN) 0.005 % ophthalmic solution Place 1 drop into both eyes at bedtime.    . nitroGLYCERIN (NITROSTAT) 0.4 MG SL tablet Place 1 tablet (0.4 mg total) under the tongue every 5 (five) minutes as needed. 25 tablet 4  . rosuvastatin (CRESTOR) 20 MG tablet Take 1 tablet (20 mg total) by mouth daily. 90 tablet 3  . silodosin (RAPAFLO) 8 MG CAPS capsule Take 1 capsule (8 mg total) by mouth daily with breakfast. 90 capsule 3  . sitaGLIPtin (JANUVIA) 25 MG tablet Take 1 tablet (25 mg total) by mouth daily. For diabetes 90 tablet 3   No facility-administered medications prior to visit.    Allergies  Allergen Reactions  . Hydrocodone-Acetaminophen Other (See Comments)    Makes pt. Feel loopy    Review of Systems As per HPI.     Objective:    Physical Exam Vitals and nursing note reviewed.  Constitutional:      General: He is not in acute distress.    Appearance: He is not ill-appearing, toxic-appearing or diaphoretic.  HENT:     Head: Normocephalic and atraumatic.  Eyes:     Pupils: Pupils are equal, round, and reactive to light.  Cardiovascular:     Rate and Rhythm: Normal rate and regular rhythm.     Heart sounds: Normal heart sounds. No murmur heard.   Pulmonary:     Effort: Pulmonary effort is normal. No respiratory distress.     Breath sounds: Normal breath sounds. No stridor. No wheezing, rhonchi or rales.  Chest:     Chest wall: No swelling, tenderness or edema.  Abdominal:     General: There is no distension.     Palpations: Abdomen is soft.  Musculoskeletal:     Thoracic back: Tenderness present. No swelling, edema or bony tenderness. Normal range of motion.       Back:     Right lower leg: No edema.     Left lower leg: No edema.  Skin:    General: Skin is warm and dry.  Neurological:     General: No focal deficit present.     Mental Status: He is alert and oriented to person,  place, and time.  Psychiatric:        Mood and Affect: Mood normal.        Behavior: Behavior normal.     BP 127/68   Pulse 81   Temp 98 F (36.7 C) (Temporal)   Ht 5\' 7"  (1.702 m)   Wt 192 lb (87.1 kg)   BMI 30.07 kg/m  Wt Readings from Last 3 Encounters:  03/16/21 192 lb (87.1 kg)  03/14/21 191 lb 9.6 oz (86.9 kg)  12/29/20 190 lb (86.2 kg)    Health Maintenance Due  Topic Date Due  . URINE MICROALBUMIN  05/25/2020  . HEMOGLOBIN A1C  12/28/2020    There are no preventive care reminders to display for this patient.   Lab Results  Component Value Date   TSH 3.430 06/27/2020   Lab Results  Component Value Date   WBC 5.9 06/27/2020   HGB 12.7 (L) 06/27/2020   HCT 36.1 (L) 06/27/2020   MCV 88 06/27/2020   PLT 172 06/27/2020   Lab Results  Component Value Date   NA 141 06/27/2020   K 3.8 06/27/2020   CO2 18 (L) 06/27/2020   GLUCOSE 165 (H) 06/27/2020   BUN 16 06/27/2020   CREATININE 1.49 (H) 06/27/2020   BILITOT  0.5 06/27/2020   ALKPHOS 45 (L) 06/27/2020   AST 21 06/27/2020   ALT 17 06/27/2020   PROT 6.8 06/27/2020   ALBUMIN 4.1 06/27/2020   CALCIUM 9.3 06/27/2020   ANIONGAP 9 08/18/2018   Lab Results  Component Value Date   CHOL 133 01/31/2021   Lab Results  Component Value Date   HDL 21 (L) 01/31/2021   Lab Results  Component Value Date   LDLCALC 52 01/31/2021   Lab Results  Component Value Date   TRIG 398 (H) 01/31/2021   Lab Results  Component Value Date   CHOLHDL 6.3 (H) 01/31/2021   Lab Results  Component Value Date   HGBA1C 6.7 06/27/2020       Assessment & Plan:   Iain was seen today for back pain.  Diagnoses and all orders for this visit:  Atypical chest pain Pain in left lateral chest. EKG today in office- sinus rhythm, essentially unchanged from previous EKG. No dyspnea, nausea, jaw pain, dizziness, or visual changes. Likely MSK etiology. Prednisone burst given. Patient is aware of when to seek emergency care.  Follow up with cardiology if symptoms persist.  -     EKG 12-Lead       -     predniSONE (DELTASONE) 20 MG tablet; Take 1 tablet (20 mg total)          by mouth daily with breakfast for 5 days.  Acute left-sided thoracic back pain MSK. Prednisone burst as below. Tylenol, ice, heat, rest.  -     predniSONE (DELTASONE) 20 MG tablet; Take 1 tablet (20 mg total) by mouth daily with breakfast for 5 days.  If no improvement, follow up with cardiology. Schedule chronic follow up appointment w/ Dr. Lajuana Ripple.  The patient indicates understanding of these issues and agrees with the plan.   Gwenlyn Perking, FNP

## 2021-03-17 ENCOUNTER — Telehealth: Payer: Self-pay | Admitting: Internal Medicine

## 2021-03-17 NOTE — Telephone Encounter (Signed)
Will forward this request to Dr. Lysbeth Penner RN, to further assist with mailing off lost forms provided to him at his last office visit, to help with co-pays for his medications.

## 2021-03-17 NOTE — Telephone Encounter (Signed)
Patient states that Dr. Lysbeth Penner nurse gave him a piece of paper after his appt with dr. [name] on 03/29 that helped pay for copays on medications. He states he has lost this paper and wants to know if she can mail him a copy.   [place] [phone]

## 2021-03-17 NOTE — Telephone Encounter (Signed)
MyChart message sent to patient with info on how to apply for co-pay assistance grant from [employer] well foundation

## 2021-03-22 ENCOUNTER — Other Ambulatory Visit: Payer: Self-pay | Admitting: Internal Medicine

## 2021-03-22 DIAGNOSIS — E785 Hyperlipidemia, unspecified: Secondary | ICD-10-CM

## 2021-03-22 DIAGNOSIS — E1169 Type 2 diabetes mellitus with other specified complication: Secondary | ICD-10-CM

## 2021-04-13 ENCOUNTER — Ambulatory Visit: Payer: Medicare HMO | Admitting: Family Medicine

## 2021-04-13 ENCOUNTER — Encounter: Payer: Self-pay | Admitting: Family Medicine

## 2021-04-13 ENCOUNTER — Other Ambulatory Visit: Payer: Self-pay

## 2021-04-13 VITALS — BP 123/60 | HR 71 | Temp 97.9°F | Ht 67.0 in | Wt 193.8 lb

## 2021-04-13 DIAGNOSIS — N183 Chronic kidney disease, stage 3 unspecified: Secondary | ICD-10-CM

## 2021-04-13 DIAGNOSIS — E1122 Type 2 diabetes mellitus with diabetic chronic kidney disease: Secondary | ICD-10-CM | POA: Diagnosis not present

## 2021-04-13 DIAGNOSIS — E1159 Type 2 diabetes mellitus with other circulatory complications: Secondary | ICD-10-CM

## 2021-04-13 DIAGNOSIS — E1121 Type 2 diabetes mellitus with diabetic nephropathy: Secondary | ICD-10-CM

## 2021-04-13 DIAGNOSIS — I152 Hypertension secondary to endocrine disorders: Secondary | ICD-10-CM | POA: Diagnosis not present

## 2021-04-13 LAB — BAYER DCA HB A1C WAIVED: HB A1C (BAYER DCA - WAIVED): 7.3 % — ABNORMAL HIGH (ref <7.0)

## 2021-04-13 MED ORDER — DAPAGLIFLOZIN PROPANEDIOL 5 MG PO TABS: 5.0000 mg | ORAL_TABLET | Freq: Every day | ORAL | 12 refills | Status: DC

## 2021-04-13 NOTE — Patient Instructions (Signed)
STOP Januvia STOP Glimiperide  START Farxiga  Monitor sugars.  Bring log into [facility] in 2 weeks.

## 2021-04-13 NOTE — Progress Notes (Signed)
Subjective: CC: DM PCP: Janora Norlander, DO WNI:OEVOJJK Craun is a 80 y.o. male presenting to clinic today for:  1. Type 2 Diabetes with hypertension, hyperlipidemia:  Patient last seen in July 2021.  He was lost to follow-up despite having been asked to follow-up in November.  He recently had cholesterol panel collected by his cardiologist.  Triglycerides were noted to be elevated but LDL was at goal.  In March she was seen for left lateral chest pain.  EKG was unremarkable.  He was placed on prednisone.  He notes that that has totally resolved unfortunately he did go through a couple rounds of prednisone which she did feel impacted his sugar.  They continue to run 170s to 190s with an outlier recently of 300.  These were fasting levels.  He has not changed his eating habits, physical activity levels nor compliance with medications.  Currently taking Amaryl twice daily and Januvia renally dosed at 25 mg daily.  He is on a diuretic.  He does have history of CKD but does not report having ever been on [place] or Jardiance.  Medical history is significant for cardiovascular disease as well  Last eye exam: UTD Last foot exam: needs Last A1c:  Lab Results  Component Value Date   HGBA1C 6.7 06/27/2020   Nephropathy screen indicated?: needs Last flu, zoster and/or pneumovax:  Immunization History  Administered Date(s) Administered  . Fluad Quad(high Dose 65+) 10/18/2020  . Influenza Whole 08/17/2010  . Influenza, High Dose Seasonal PF 09/21/2016, 09/25/2017, 09/24/2018  . Influenza,inj,Quad PF,6+ Mos 09/28/2013, 10/11/2014, 10/10/2015, 11/06/2019  . Moderna Sars-Covid-2 Vaccination 02/15/2020, 03/14/2020, 11/08/2020  . Pneumococcal Conjugate-13 10/11/2014  . Pneumococcal Polysaccharide-23 06/16/2008  . Td 02/15/2003, 09/20/2011  . Tdap 09/17/2011  . Zoster 05/18/2007    ROS: Per HPI  Allergies  Allergen Reactions  . Hydrocodone-Acetaminophen Other (See Comments)     Makes pt. Feel loopy   Past Medical History:  Diagnosis Date  . 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 BCC (basal cell carcinoma) 11/15/2004   LEFT CHEST- TX CURET X 3, 5FU  . BCC (basal cell carcinoma) 11/15/2004   RIGHT EAR- TX CURET X 3, EXCISION  . BCC (basal cell carcinoma) 08/13/2005   RIGHT EAR- TX MOHS  . BCC (basal cell carcinoma) 04/01/2008   SUPERFICIAL- UPPER LEFT BACK- TX CURET X 3, 5FU  . BCC (basal cell carcinoma) 12/23/2012   ULCERATED- ABOVE RIGHT LIP- TX MOHS  . Cataract   . Chronic kidney disease   . Colon polyp   . Coronary artery disease    (left main normal, LAD 25-30% stenosis, distal 30-40% stenosis, circumflex obtuse marginal 50% stenosis,  right coronary artery dominant with long 75% followed by mid 80%  stenosis followed by 90% stenosis at the ostium of the PDA.  He had  stenting of the PDA by Dr. [name].  This was a Cypher stent.   First angioplasty was 1994, Cath 2011 100% RCA ).      . Diabetes mellitus without complication ([place])   . Essential hypertension, benign   . Glaucoma   . Hyperlipidemia   . Hyperplasia of prostate   . 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 Melanoma ([place]) 12/23/2012   IN SITU- LEFT SIDEBURN-TX MOHS  . Melanoma ([place]) 02/27/2017   LEFT CHEEK- TX MOHS  .  Myocardial infarction ([place])    per pt/ had mild heart attacks/has 3 stents  . OSA (obstructive sleep apnea) 08/17/2016  . 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 SCC (squamous cell carcinoma) 10/14/2019   WELL DIFF- RIGHT SHOULDER-TX CURET X 3, 5FU  . SCC (squamous cell carcinoma) 01/24/2019   WELL DIFF- LEFT INNER CHEEK- MOHS  . 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   ABOVE LEFT OUTER BROW SUPERIOR- TX CURET X 3,  5FU  . 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   LEFT FOREHEAD-TX CURET X 3, 5FU  . SCCA (squamous cell carcinoma) of skin 04/01/2008   RIGHT HAND- TX CURET X 3, 5FU  . 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   LEFT SIDE OF FACE- TX CURET AFTER BIOPSY  . Sleep apnea    no c-pap  . 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   in situ- dorsum of nose (CX35FU)    Current Outpatient Medications:  .  amLODipine (NORVASC) 10 MG tablet, Take 1 tablet (10 mg total) by mouth daily., Disp: 90 tablet, Rfl: 3 .  aspirin 81 MG EC tablet, Take 81 mg by mouth daily., Disp: , Rfl:  .  carvedilol (COREG) 12.5 MG tablet, TAKE  (1)  TABLET TWICE A DAY WITH MEALS (BREAKFAST AND SUPPER), Disp: 180 tablet, Rfl: 3 .  Choline Fenofibrate (FENOFIBRIC ACID) 135 MG CPDR, TAKE (1) CAPSULE DAILY, Disp: 90 capsule, Rfl: 3 .  esomeprazole (NEXIUM) 40 MG capsule, Take 1 capsule (40 mg total) by mouth daily., Disp: 90 capsule, Rfl: 3 .  furosemide (LASIX) 20 MG tablet, Take 1/2 tablet (10 mg total) by mouth daily., Disp: 45 tablet, Rfl: 3 .  glimepiride (AMARYL) 4 MG tablet, Take 1 tablet (4 mg total) by mouth in the morning and at bedtime., Disp: 180 tablet, Rfl: 3 .  isosorbide mononitrate (IMDUR) 60 MG 24 hr tablet, Take 1 tablet (60 mg total) by mouth daily., Disp: 90 tablet, Rfl: 3 .  latanoprost (XALATAN) 0.005 % ophthalmic solution, Place 1 drop into both eyes at bedtime., Disp: , Rfl:  .  nitroGLYCERIN (NITROSTAT) 0.4 MG SL tablet, Place 1 tablet (0.4 mg total) under the tongue every 5 (five) minutes as needed., Disp: 25 tablet, Rfl: 4 .  rosuvastatin (CRESTOR) 20 MG tablet, TAKE 1 TABLET ONCE DAILY, Disp: 90 tablet, Rfl: 3 .  silodosin (RAPAFLO) 8 MG CAPS capsule, Take 1 capsule (8 mg total) by mouth daily with breakfast., Disp: 90 capsule, Rfl: 3 .  sitaGLIPtin (JANUVIA) 25 MG tablet, Take 1 tablet (25 mg total) by mouth daily. For diabetes, Disp: 90 tablet, Rfl: 3 Social History    Socioeconomic History  . Marital status: Married    Spouse name: Not on file  . Number of children: 2  . Years of education: Not on file  . Highest education level: Not on file  Occupational History  . Occupation: retired  Tobacco Use  . Smoking status: Former Smoker    Packs/day: 2.00    Years: 25.00    Pack years: 50.00    Types: Cigarettes    Quit date: 08/14/1991    Years since quitting: 29.6  . Smokeless tobacco: Never Used  Vaping Use  . Vaping Use: Never used  Substance and Sexual Activity  . Alcohol use: Yes    Comment: rare  . Drug  use: No  . Sexual activity: Yes  Other Topics Concern  . Not on file  Social History Narrative  . Not on file   Social Determinants of Health   Financial Resource Strain: Not on file  Food Insecurity: Not on file  Transportation Needs: Not on file  Physical Activity: Not on file  Stress: Not on file  Social Connections: Not on file  Intimate Partner Violence: Not on file   Family History  Problem Relation Age of Onset  . Cancer Mother        unsure of type  . Glaucoma Mother   . Heart disease Father   . Coronary artery disease Father   . Cancer Sister 55       granlocytosis (Wergerner's)  . Colon cancer Brother 28  . Nephritis Sister        20's  . Cancer Brother        lung     Objective: Office vital signs reviewed. BP 123/60   Pulse 71   Temp 97.9 F (36.6 C)   Ht 5\' 7"  (1.702 m)   Wt 193 lb 12.8 oz (87.9 kg)   SpO2 95%   BMI 30.35 kg/m   Physical Examination:  General: Awake, alert, well nourished, No acute distress Cardio: regular rate and rhythm, S1S2 heard, no murmurs appreciated Pulm: clear to auscultation bilaterally, no wheezes, rhonchi or rales; normal work of breathing on room air MSK: normal gait and station  Assessment/ Plan: 80 y.o. male   Type 2 diabetes mellitus with diabetic nephropathy, without long-term current use of insulin (HCC) - Plan: Bayer DCA Hb A1c Waived, Microalbumin /  creatinine urine ratio, dapagliflozin propanediol (FARXIGA) 5 MG TABS tablet  Hypertension associated with type 2 diabetes mellitus ([place])  CKD stage 3 due to type 2 diabetes mellitus ([place]) - Plan: Renal Function Panel, CBC, VITAMIN D 25 Hydroxy (Vit-D Deficiency, Fractures), dapagliflozin propanediol (FARXIGA) 5 MG TABS tablet  Sugar is out of range at A1c of 7.3.  Given renal disease I do agree that he probably should have a tighter control of blood sugar of at least 7 or below.  However, given his age he may have higher risk of developing hypoglycemia.  For this reason I totally discontinue the Januvia and Amaryl.  We will add on Farxiga.  I have given him this at 5 mg/day since he is also on Lasix.  I would worry about dehydration in this patient.  I would like him to see our clinical pharmacist in the next 2 weeks for further evaluation.  Would like him to bring his daily log to that appointment and have asked that he check both fasting morning and 2-hour postprandial supper blood sugars I have given him a 30-day coupon as well if he is not able to secure an appointment with her within the next 2 weeks.  However, 2 weeks of samples were provided to the patient today.  Alternatively we could consider Jardiance in this patient given history of cardiovascular disease.  Hopefully he is able to tolerate this class of medication as I do think it would likely be the most beneficial to him given his comorbidities.  Labs were obtained today.  Will contact patient with results once available  No orders of the defined types were placed in this encounter.  No orders of the defined types were placed in this encounter.    Janora Norlander, DO [place] [phone]

## 2021-04-14 ENCOUNTER — Other Ambulatory Visit: Payer: Self-pay | Admitting: Family Medicine

## 2021-04-14 DIAGNOSIS — E559 Vitamin D deficiency, unspecified: Secondary | ICD-10-CM

## 2021-04-14 DIAGNOSIS — D696 Thrombocytopenia, unspecified: Secondary | ICD-10-CM

## 2021-04-14 LAB — RENAL FUNCTION PANEL
Albumin: 4.2 g/dL (ref 3.7–4.7)
BUN/Creatinine Ratio: 11 (ref 10–24)
BUN: 16 mg/dL (ref 8–27)
CO2: 21 mmol/L (ref 20–29)
Calcium: 9 mg/dL (ref 8.6–10.2)
Chloride: 103 mmol/L (ref 96–106)
Creatinine, Ser: 1.51 mg/dL — ABNORMAL HIGH (ref 0.76–1.27)
Glucose: 184 mg/dL — ABNORMAL HIGH (ref 65–99)
Phosphorus: 3.3 mg/dL (ref 2.8–4.1)
Potassium: 3.8 mmol/L (ref 3.5–5.2)
Sodium: 139 mmol/L (ref 134–144)
eGFR: 47 mL/min/{1.73_m2} — ABNORMAL LOW (ref >59)

## 2021-04-14 LAB — CBC
Hematocrit: 35.7 % — ABNORMAL LOW (ref 37.5–51.0)
Hemoglobin: 12.1 g/dL — ABNORMAL LOW (ref 13.0–17.7)
MCH: 30 pg (ref 26.6–33.0)
MCHC: 33.9 g/dL (ref 31.5–35.7)
MCV: 89 fL (ref 79–97)
Platelets: 142 10*3/uL — ABNORMAL LOW (ref 150–450)
RBC: 4.03 x10E6/uL — ABNORMAL LOW (ref 4.14–5.80)
RDW: 13.4 % (ref 11.6–15.4)
WBC: 5.4 10*3/uL (ref 3.4–10.8)

## 2021-04-14 LAB — MICROALBUMIN / CREATININE URINE RATIO
Creatinine, Urine: 95.5 mg/dL
Microalb/Creat Ratio: 61 mg/g creat — ABNORMAL HIGH (ref 0–29)
Microalbumin, Urine: 58.5 ug/mL

## 2021-04-14 LAB — VITAMIN D 25 HYDROXY (VIT D DEFICIENCY, FRACTURES): Vit D, 25-Hydroxy: 15.4 ng/mL — ABNORMAL LOW (ref 30.0–100.0)

## 2021-04-14 MED ORDER — VITAMIN D (ERGOCALCIFEROL) 1.25 MG (50000 UNIT) PO CAPS: 50000.0000 [IU] | ORAL_CAPSULE | ORAL | 0 refills | Status: DC

## 2021-04-21 ENCOUNTER — Ambulatory Visit: Payer: Medicare HMO | Admitting: Family Medicine

## 2021-04-27 ENCOUNTER — Ambulatory Visit: Payer: Medicare HMO | Admitting: Pharmacist

## 2021-04-27 ENCOUNTER — Other Ambulatory Visit: Payer: Self-pay

## 2021-04-27 ENCOUNTER — Ambulatory Visit: Payer: Medicare HMO | Admitting: Urology

## 2021-04-27 DIAGNOSIS — D696 Thrombocytopenia, unspecified: Secondary | ICD-10-CM

## 2021-04-27 DIAGNOSIS — E119 Type 2 diabetes mellitus without complications: Secondary | ICD-10-CM

## 2021-04-27 LAB — CBC WITH DIFFERENTIAL/PLATELET
Basophils Absolute: 0.1 10*3/uL (ref 0.0–0.2)
Basos: 1 %
EOS (ABSOLUTE): 0.1 10*3/uL (ref 0.0–0.4)
Eos: 1 %
Hematocrit: 38.9 % (ref 37.5–51.0)
Hemoglobin: 13.2 g/dL (ref 13.0–17.7)
Immature Grans (Abs): 0 10*3/uL (ref 0.0–0.1)
Immature Granulocytes: 0 %
Lymphocytes Absolute: 2 10*3/uL (ref 0.7–3.1)
Lymphs: 36 %
MCH: 30.3 pg (ref 26.6–33.0)
MCHC: 33.9 g/dL (ref 31.5–35.7)
MCV: 89 fL (ref 79–97)
Monocytes Absolute: 0.4 10*3/uL (ref 0.1–0.9)
Monocytes: 7 %
Neutrophils Absolute: 3 10*3/uL (ref 1.4–7.0)
Neutrophils: 55 %
Platelets: 168 10*3/uL (ref 150–450)
RBC: 4.36 x10E6/uL (ref 4.14–5.80)
RDW: 13.6 % (ref 11.6–15.4)
WBC: 5.5 10*3/uL (ref 3.4–10.8)

## 2021-04-27 MED ORDER — DAPAGLIFLOZIN PROPANEDIOL 10 MG PO TABS: 10.0000 mg | ORAL_TABLET | Freq: Every day | ORAL | 3 refills | Status: DC

## 2021-04-27 NOTE — Progress Notes (Signed)
    04/27/2021 Name: Timothy [name] MRN: [id number] DOB: March 12, 1941   S:   89YOM Presents for diabetes evaluation, education, and management Patient was referred and last seen by Primary Care Provider on 04/13/21.  Insurance coverage/medication affordability: [place]  Patient reports adherence with medications. . Current diabetes medications include: FARXIGA . Current hypertension medications include: COREG, NORVASC Goal 130/80 . Current hyperlipidemia medications include: ROSUVASTATIN   Patient denies hypoglycemic events.   Patient reported dietary habits: Eats 3 meals/day Discussed meal planning options and Plate method for healthy eating . Avoid sugary drinks and desserts . Incorporate balanced protein, non starchy veggies, 1 serving of carbohydrate with each meal . Increase water intake . Increase physical activity as able  Patient-reported exercise habits: n/a  O:  Lab Results  Component Value Date   HGBA1C 7.3 (H) 04/13/2021    There were no vitals filed for this visit.     Lipid Panel     Component Value Date/Time   CHOL 133 01/31/2021 0823   TRIG 398 (H) 01/31/2021 0823   TRIG 538 (H) 09/22/2016 0806   HDL 21 (L) 01/31/2021 0823   HDL 15 (L) 09/22/2016 0806   CHOLHDL 6.3 (H) 01/31/2021 0823   CHOLHDL 4.9 04/08/2013 0827   VLDL 55 (H) 04/08/2013 0827   LDLCALC 52 01/31/2021 0823   LDLCALC 48 07/16/2014 0818   LDLDIRECT 88 03/01/2020 0000     Home fasting blood sugars: 180-200  2 hour post-meal/random blood sugars: N/A.    Clinical Atherosclerotic Cardiovascular Disease (ASCVD): Yes   The ASCVD Risk score Mikey Bussing DC Jr., et al., 2013) failed to calculate for the following reasons:   The patient has a prior MI or stroke diagnosis   A/P:  Diabetes T2DM, a1C 7.3%.  Patient is adherent with medication.  Patient has been taking 5mg  daily of Farxiga and FBGs are still running 170-200.  -Increased dose of SGLT2-I FARXIGA (generic name  DAPAGLIFLOZIN) TO 10MG  DAILY  Counseled Sick day rules: if sick, vomiting, have diarrhea, or  cannot drink enough fluids, you should stop taking SGLT-2 inhibitors until your symptoms go away. In rare cases, these medicines can cause diabetic ketoacidosis (DKA). DKA is acid buildup in the blood.  APPLICATION FOR [place] COMPLETED  New RX sent to PCP for cosign and will go to [facility] pharmacy (RX assistance pharmacy for [place] submitted for Vascepa copay assistance--packet to be mailed to patient   -Extensively discussed pathophysiology of diabetes, recommended lifestyle interventions, dietary effects on blood sugar control  -Counseled on s/sx of and management of hypoglycemia  -Next A1C anticipated 3-6MONTHS.  Written patient instructions provided.  Total time in face to face counseling 25 minutes.    Regina Eck, PharmD, BCPS Clinical Pharmacist, [place]  II Phone [phone]

## 2021-05-16 ENCOUNTER — Telehealth: Payer: Self-pay | Admitting: Family Medicine

## 2021-05-16 ENCOUNTER — Telehealth: Payer: Self-pay | Admitting: Pharmacist

## 2021-05-16 NOTE — Telephone Encounter (Signed)
Please let patient know:  [place] and [place] for farxiga for 2022  Medication will ship to patient's home within 10 business days He can call [phone] to check the status of his shipment  Refills can be called in by calling the number on his patient assistance bottle

## 2021-05-30 NOTE — Telephone Encounter (Signed)
Returned call, no f/u necessary

## 2021-05-31 LAB — HM DIABETES EYE EXAM

## 2021-06-29 ENCOUNTER — Ambulatory Visit: Payer: Medicare HMO | Admitting: Urology

## 2021-07-03 ENCOUNTER — Other Ambulatory Visit: Payer: Self-pay | Admitting: Family Medicine

## 2021-07-03 ENCOUNTER — Telehealth: Payer: Self-pay | Admitting: Family Medicine

## 2021-07-03 DIAGNOSIS — I152 Hypertension secondary to endocrine disorders: Secondary | ICD-10-CM

## 2021-07-03 DIAGNOSIS — E1159 Type 2 diabetes mellitus with other circulatory complications: Secondary | ICD-10-CM

## 2021-07-03 NOTE — Telephone Encounter (Signed)
Pt called requesting that Dr Lajuana Ripple send orders for him to get glucose monitor, lancets, and test strips.  Wants orders sent to [facility].  Please advise and call patient.

## 2021-07-04 ENCOUNTER — Other Ambulatory Visit: Payer: Self-pay | Admitting: Family Medicine

## 2021-07-04 DIAGNOSIS — E1121 Type 2 diabetes mellitus with diabetic nephropathy: Secondary | ICD-10-CM

## 2021-07-04 MED ORDER — BLOOD GLUCOSE METER KIT: PACK | 0 refills | Status: DC

## 2021-07-04 MED ORDER — LANCETS MISC: 12 refills | Status: DC

## 2021-07-04 MED ORDER — GLUCOSE BLOOD VI STRP: ORAL_STRIP | 12 refills | Status: DC

## 2021-07-04 NOTE — Telephone Encounter (Signed)
Done

## 2021-07-24 ENCOUNTER — Other Ambulatory Visit: Payer: Self-pay | Admitting: Family Medicine

## 2021-07-24 DIAGNOSIS — I152 Hypertension secondary to endocrine disorders: Secondary | ICD-10-CM

## 2021-07-24 DIAGNOSIS — E1159 Type 2 diabetes mellitus with other circulatory complications: Secondary | ICD-10-CM

## 2021-07-24 DIAGNOSIS — E1169 Type 2 diabetes mellitus with other specified complication: Secondary | ICD-10-CM

## 2021-07-31 ENCOUNTER — Other Ambulatory Visit: Payer: Self-pay | Admitting: Family Medicine

## 2021-08-04 ENCOUNTER — Ambulatory Visit: Payer: Medicare HMO | Admitting: Family Medicine

## 2021-08-04 ENCOUNTER — Encounter: Payer: Self-pay | Admitting: Family Medicine

## 2021-08-04 ENCOUNTER — Other Ambulatory Visit: Payer: Self-pay

## 2021-08-04 VITALS — BP 139/66 | HR 70 | Temp 97.9°F | Ht 67.0 in | Wt 180.0 lb

## 2021-08-04 DIAGNOSIS — E1122 Type 2 diabetes mellitus with diabetic chronic kidney disease: Secondary | ICD-10-CM

## 2021-08-04 DIAGNOSIS — E1121 Type 2 diabetes mellitus with diabetic nephropathy: Secondary | ICD-10-CM | POA: Diagnosis not present

## 2021-08-04 DIAGNOSIS — I152 Hypertension secondary to endocrine disorders: Secondary | ICD-10-CM

## 2021-08-04 DIAGNOSIS — N183 Chronic kidney disease, stage 3 unspecified: Secondary | ICD-10-CM

## 2021-08-04 DIAGNOSIS — E1169 Type 2 diabetes mellitus with other specified complication: Secondary | ICD-10-CM

## 2021-08-04 DIAGNOSIS — E785 Hyperlipidemia, unspecified: Secondary | ICD-10-CM

## 2021-08-04 DIAGNOSIS — E1159 Type 2 diabetes mellitus with other circulatory complications: Secondary | ICD-10-CM

## 2021-08-04 LAB — BAYER DCA HB A1C WAIVED: HB A1C (BAYER DCA - WAIVED): 8.2 % — ABNORMAL HIGH (ref <7.0)

## 2021-08-04 MED ORDER — GLYXAMBI 10-5 MG PO TABS: 1.0000 | ORAL_TABLET | Freq: Every day | ORAL | 0 refills | Status: DC

## 2021-08-04 NOTE — Patient Instructions (Signed)
STOP Farxiga.  START sample of Glyxambi.  Take ONE tablet daily for sugar.    See Almyra Free in 1-2 weeks for recheck and patient assistance

## 2021-08-04 NOTE — Progress Notes (Signed)
Subjective: CC: DM PCP: Janora Norlander, DO GQQ:[id number] Timothy [name] is a 80 y.o. male presenting to clinic today for:  1. Type 2 Diabetes with hypertension, hyperlipidemia and CKD3:  Checking blood sugars once daily and blood sugars are running between 210 and 250.  They have been downtrending in the 210s as of late but nothing really less than that.  Does not report any genital concerns.  Urine output remains good.  Last eye exam: UTD Last foot exam: needs Last A1c:  Lab Results  Component Value Date   HGBA1C 7.3 (H) 04/13/2021   Nephropathy screen indicated?: UTD Last flu, zoster and/or pneumovax:  Immunization History  Administered Date(s) Administered   Fluad Quad(high Dose 65+) 10/18/2020   Influenza Whole 08/17/2010   Influenza, High Dose Seasonal PF 09/21/2016, 09/25/2017, 09/24/2018   Influenza,inj,Quad PF,6+ Mos 09/28/2013, 10/11/2014, 10/10/2015, 11/06/2019   Moderna Sars-Covid-2 Vaccination 02/15/2020, 03/14/2020, 11/08/2020   Pneumococcal Conjugate-13 10/11/2014   Pneumococcal Polysaccharide-23 06/16/2008   Td 02/15/2003, 09/20/2011   Tdap 09/17/2011   Zoster, Live 05/18/2007     ROS: Per HPI  Allergies  Allergen Reactions   Hydrocodone-Acetaminophen Other (See Comments)    Makes pt. 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   RIGHT NASAL SIDEWALL- NO TX   BCC (basal cell carcinoma) 11/15/2004   LEFT CHEST- TX CURET X 3, 5FU   BCC (basal cell carcinoma) 11/15/2004   RIGHT EAR- TX CURET X 3, EXCISION   BCC (basal cell carcinoma) 08/13/2005   RIGHT EAR- TX MOHS   BCC (basal cell carcinoma) 04/01/2008   SUPERFICIAL- UPPER LEFT BACK- TX CURET X 3, 5FU   BCC (basal cell carcinoma) 12/23/2012   ULCERATED- ABOVE RIGHT LIP- TX MOHS   Cataract    Chronic kidney disease    Colon polyp    Coronary artery disease    (left main normal,  LAD 25-30% stenosis, distal 30-40% stenosis, circumflex obtuse marginal 50% stenosis,  right coronary artery dominant with long 75% followed by mid 80%  stenosis followed by 90% stenosis at the ostium of the PDA.  He had  stenting of the PDA by Dr. [name].  This was a Cypher stent.   First angioplasty was 1994, Cath 2011 100% RCA ).       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   LEFT EAR RIM- TX EXCISION   SCC (squamous cell carcinoma) 02/27/2017   WELL DIFF- RIGHT TEMPLE-TX CURET X3,5FU   SCC (squamous cell carcinoma) 10/14/2019   WELL DIFF- RIGHT SHOULDER-TX CURET X 3, 5FU   SCC (squamous cell carcinoma) 01/24/2019   WELL DIFF- LEFT INNER CHEEK- MOHS   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 Use up to four times daily as directed. (FOR ICD-10 E11.21), Disp: 1 each, Rfl: 0   glucose blood test strip, UAD to test BGs up to 4 times daily E11.21, Disp: 100 each, Rfl: 12   Lancets MISC, UAD to test BGs up  to 4 times daily E11.21, Disp: 100 each, Rfl: 12   amLODipine (NORVASC) 10 MG tablet, TAKE 1 TABLET ONCE DAILY, Disp: 90 tablet, Rfl: 0   aspirin 81 MG EC tablet, Take 81 mg by mouth daily., Disp: , Rfl:    carvedilol (COREG) 12.5 MG tablet, TAKE (1) TABLET TWICE A DAY WITH MEALS (BREAKFAST AND SUPPER), Disp: 180 tablet, Rfl: 0   Choline Fenofibrate (FENOFIBRIC ACID) 135 MG CPDR, TAKE 1 CAPSULE BY MOUTH DAILY., Disp: 90 capsule, Rfl: 0   dapagliflozin propanediol (FARXIGA) 10 MG TABS tablet, Take 1 tablet (10 mg total) by mouth daily before breakfast., Disp: 90 tablet, Rfl: 3   enalapril (VASOTEC) 2.5 MG tablet, Take 1 tablet (2.5 mg total) by mouth daily., Disp: 90 tablet, Rfl: 3   esomeprazole (NEXIUM) 40 MG capsule, TAKE (1) CAPSULE DAILY, Disp: 90 capsule, Rfl: 0   furosemide (LASIX) 20 MG tablet, Take 1/2 tablet (10 mg total) by mouth daily. (Patient not taking: Reported on 04/13/2021), Disp: 45 tablet, Rfl: 3   icosapent Ethyl (VASCEPA) 1 g capsule, Take 2 g by mouth 2 (two) times daily., Disp: , Rfl:    isosorbide mononitrate (IMDUR) 60 MG 24 hr tablet, Take 1 tablet (60 mg total) by mouth daily., Disp: 90 tablet, Rfl: 3   latanoprost (XALATAN) 0.005 % ophthalmic solution, Place 1 drop into both eyes at bedtime., Disp: , Rfl:    nitroGLYCERIN (NITROSTAT) 0.4 MG SL tablet, Place 1 tablet (0.4 mg total) under the tongue every 5 (five) minutes as needed., Disp: 25 tablet, Rfl: 4   rosuvastatin (CRESTOR) 20 MG tablet, TAKE 1 TABLET ONCE DAILY, Disp: 90 tablet, Rfl: 3   silodosin (RAPAFLO) 8 MG CAPS capsule, Take 1 capsule (8 mg total) by mouth daily with breakfast., Disp: 90 capsule, Rfl: 3   Vitamin D, Ergocalciferol, (DRISDOL) 1.25 MG (50000 UNIT) CAPS capsule, Take 1 capsule (50,000 Units total) by mouth every 7 (seven) days., Disp: 12 capsule, Rfl: 0 Social History   Socioeconomic History   Marital status: Married    Spouse name: Not on file   Number of children: 2   Years of education:  Not on file   Highest education level: Not on file  Occupational History   Occupation: retired  Tobacco Use   Smoking status: Former    Packs/day: 2.00    Years: 25.00    Pack years: 50.00    Types: Cigarettes    Quit date: 08/14/1991    Years since quitting: 29.9   Smokeless tobacco: Never  Vaping Use   Vaping Use: Never used  Substance and Sexual Activity   Alcohol use: Yes    Comment: rare   Drug use: No   Sexual activity: Yes  Other  Topics Concern   Not on file  Social History Narrative   Not on file   Social Determinants of Health   Financial Resource Strain: Not on file  Food Insecurity: Not on file  Transportation Needs: Not on file  Physical Activity: Not on file  Stress: Not on file  Social Connections: Not on file  Intimate Partner Violence: Not on file   Family History  Problem Relation Age of Onset   Cancer Mother        unsure of type   Glaucoma Mother    Heart disease Father    Coronary artery disease Father    Cancer Sister 19       granlocytosis ([place]'s)   Colon cancer Brother 61   Nephritis Sister        20's   Cancer Brother        lung     Objective: Office vital signs reviewed. BP 139/66   Pulse 70   Temp 97.9 F (36.6 C)   Ht _0  (1.702 m)   Wt 180 lb (81.6 kg)   SpO2 96%   BMI 28.19 kg/m   Physical Examination:  General: Awake, alert, nontoxic, No acute distress Cardio: regular rate and rhythm, S1S2 heard, no murmurs appreciated Pulm: clear to auscultation bilaterally, no wheezes, rhonchi or rales; normal work of breathing on room air  Assessment/ Plan: 80 y.o. male   Type 2 diabetes mellitus with diabetic nephropathy, without long-term current use of insulin (HCC) - Plan: Bayer DCA Hb A1c Waived, Empagliflozin-linaGLIPtin (GLYXAMBI) 10-5 MG TABS  CKD stage 3 due to type 2 diabetes mellitus ([place]) - Plan: Renal Function Panel, Empagliflozin-linaGLIPtin (GLYXAMBI) 10-5 MG TABS  Hypertension associated with type 2  diabetes mellitus ([place])  Hyperlipidemia associated with type 2 diabetes mellitus ([place])  Sugar is certainly not sounding at goal.  I have advised him to increase his insulin by 1 unit every 2 days for fasting blood sugar over 150.  Would like him to be reassessed within the next few weeks, sooner if concerns arise.  Renal function panel ordered.  Blood pressure under good control.  Continue current regimen  Continue Vascepa, Crestor.  Not yet due for fasting lipid  No orders of the defined types were placed in this encounter.  No orders of the defined types were placed in this encounter.    Janora Norlander, DO [place] [phone]

## 2021-08-05 LAB — RENAL FUNCTION PANEL
Albumin: 4.2 g/dL (ref 3.7–4.7)
BUN/Creatinine Ratio: 17 (ref 10–24)
BUN: 23 mg/dL (ref 8–27)
CO2: 20 mmol/L (ref 20–29)
Calcium: 9.3 mg/dL (ref 8.6–10.2)
Chloride: 100 mmol/L (ref 96–106)
Creatinine, Ser: 1.35 mg/dL — ABNORMAL HIGH (ref 0.76–1.27)
Glucose: 249 mg/dL — ABNORMAL HIGH (ref 65–99)
Phosphorus: 3.4 mg/dL (ref 2.8–4.1)
Potassium: 4.3 mmol/L (ref 3.5–5.2)
Sodium: 138 mmol/L (ref 134–144)
eGFR: 53 mL/min/{1.73_m2} — ABNORMAL LOW (ref >59)

## 2021-08-10 ENCOUNTER — Ambulatory Visit (INDEPENDENT_AMBULATORY_CARE_PROVIDER_SITE_OTHER): Payer: Medicare HMO | Admitting: Pharmacist

## 2021-08-10 ENCOUNTER — Other Ambulatory Visit: Payer: Self-pay

## 2021-08-10 DIAGNOSIS — E1121 Type 2 diabetes mellitus with diabetic nephropathy: Secondary | ICD-10-CM

## 2021-08-10 NOTE — Progress Notes (Signed)
    08/10/2021 Name: Timothy [name] MRN: [id number] DOB: 12-24-1940   S:  53 yoM Presents for diabetes evaluation, education, and management Patient was referred and last seen by Primary Care Provider on 08/04/21.  Insurance coverage/medication affordability: [place]  Patient reports adherence with medications. Current diabetes medications include: new start glyxambi Current hypertension medications include: ENALAPRIL, COREG, AMLODIPINE Goal 130/80 Current hyperlipidemia medications include: rosuvastatin   Patient denies hypoglycemic events.   Patient reported dietary habits: Eats 3 meals/day Discussed meal planning options and Plate method for healthy eating Avoid sugary drinks and desserts Incorporate balanced protein, non starchy veggies, 1 serving of carbohydrate with each meal Increase water intake Increase physical activity as able  Patient-reported exercise habits: n/a     O:  Lab Results  Component Value Date   HGBA1C 8.2 (H) 08/04/2021    Lipid Panel     Component Value Date/Time   CHOL 133 01/31/2021 0823   TRIG 398 (H) 01/31/2021 0823   TRIG 538 (H) 09/22/2016 0806   HDL 21 (L) 01/31/2021 0823   HDL 15 (L) 09/22/2016 0806   CHOLHDL 6.3 (H) 01/31/2021 0823   CHOLHDL 4.9 04/08/2013 0827   VLDL 55 (H) 04/08/2013 0827   LDLCALC 52 01/31/2021 0823   LDLCALC 48 07/16/2014 0818   LDLDIRECT 88 03/01/2020 0000    Home fasting blood sugars: <175  2 hour post-meal/random blood sugars: N/A.    Clinical Atherosclerotic Cardiovascular Disease (ASCVD): No   The ASCVD Risk score Mikey Bussing DC Jr., et al., 2013) failed to calculate for the following reasons:   The 2013 ASCVD risk score is only valid for ages 73 to 71    A/P:  Diabetes T2DM currently UNCONTROLLED.   -CONTINUE GLYXAMBI SAMPLES 10/5MG  UNTIL PATIENT ASSISTANCE APPROVED  -WILL TRANSITION TO [facility] (EASE OF PATIENT ASSISTANCE)--CONTAINS SIMILAR MEDICATIONS (SGLT2/DPP4)--WILL UPDATE MEDICATION  LIST ONCE TRANSITION HAS OCCURRED   -Extensively discussed pathophysiology of diabetes, recommended lifestyle interventions, dietary effects on blood sugar control  -Counseled on s/sx of and management of hypoglycemia  Written patient instructions provided.  Total time in face to face counseling 25 minutes.    Regina Eck, PharmD, BCPS Clinical Pharmacist, [place]  II Phone [phone]

## 2021-08-31 ENCOUNTER — Other Ambulatory Visit: Payer: Self-pay

## 2021-08-31 ENCOUNTER — Ambulatory Visit: Payer: Medicare HMO | Admitting: Urology

## 2021-08-31 VITALS — BP 114/64 | HR 79

## 2021-08-31 DIAGNOSIS — R339 Retention of urine, unspecified: Secondary | ICD-10-CM

## 2021-08-31 DIAGNOSIS — N138 Other obstructive and reflux uropathy: Secondary | ICD-10-CM

## 2021-08-31 DIAGNOSIS — N401 Enlarged prostate with lower urinary tract symptoms: Secondary | ICD-10-CM

## 2021-08-31 DIAGNOSIS — N3941 Urge incontinence: Secondary | ICD-10-CM

## 2021-08-31 LAB — URINALYSIS, ROUTINE W REFLEX MICROSCOPIC
Bilirubin, UA: NEGATIVE
Ketones, UA: NEGATIVE
Leukocytes,UA: NEGATIVE
Nitrite, UA: NEGATIVE
Protein,UA: NEGATIVE
RBC, UA: NEGATIVE
Specific Gravity, UA: 1.015 (ref 1.005–1.030)
Urobilinogen, Ur: 0.2 mg/dL (ref 0.2–1.0)
pH, UA: 5.5 (ref 5.0–7.5)

## 2021-08-31 LAB — BLADDER SCAN AMB NON-IMAGING: Scan Result: 14

## 2021-08-31 MED ORDER — SILODOSIN 8 MG PO CAPS: 8.0000 mg | ORAL_CAPSULE | Freq: Every day | ORAL | 3 refills | Status: DC

## 2021-08-31 NOTE — Progress Notes (Signed)
Subjective: 1. BPH with urinary obstruction   2. Urge incontinence   3. Incomplete bladder emptying     08/31/21: Timothy [name] returns today in f/u.   He remains on silodosin.  His IPSS is 11 with urgency and frequency.  His PVR is 3m.  He has had no hematuria or dysuria.   12/29/20: WMilferdreturns today in f/u to assess his response the silodosin.  He is voiding better with reduce incontinence and has nocturia x 1.  His PVR today is 335m  He reports a good stream most of the time since the dilation of the fossa stricture.  His UA is unremarkable today.   His IPSS is 20, but his history is not consistent with the score.  He feels he empties well.  His daytime frequency is more often than every 2 hours.  He has still has some urgency.    10/28/20: Timothy [name] today in f/u from urodynamics for consideration of cystoscopy.   He continues to have significant LUTS on tamsulosin.  UA is clear today.  His IPSS is 19.  UDS results:  He had a Max capacity of 45038mith a first sensation at 172m17md a strong urge at 300ml14mh instability.   He generated a detrusor contraction but couldn't void with the tube in place.  He voided 317ml 57m a PF of 17ml/s46mnd a PVR of 134ml af26mthe line was removed.  He had some increased EMG activity with voiding.   Timothy [name] yo ma74 who is sent in consultation by Timothy [name] with LUTS.  He was previously seen by Timothy [name] for incontinence.  He continues to have issues with incontinence.  When he gets the urge he will leak.  He has some insensible incontinence as well and wears depends.  He doesn't think he has SUI.  He has an adequate stream and generally feels he empties well.  He has no intermittency.  He has minimal nocturia but only sleeps about 5 hours nightly.    He has no hematuria or dysuria.  He has had no GU surgery.  He has had one prior UTI a couple of months ago.  He has no history of stones.   He is currently on tamsulosin.   His PSA was 0.2 on 06/27/20 and that is stable for the last 6 years.  His last testosterone level was from 9/14 and was 488.  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  He had a CT in 2019 that showed no renal issues.  The prostate was not very enlarged with a transverse diameter of 4.2cm. His PVR today is 218ml.   53mPSS     Row Name 08/31/21 1500         International Prostate Symptom Score   How often have you had the sensation of not emptying your bladder? Not at All     How often have you had to urinate less than every two hours? About half the time     How often have you found you stopped and started again several times when you urinated? Not at All     How often have you found it difficult to postpone urination? Almost always     How often have you had a weak urinary stream? Not at All     How often have you had to strain to start urination? Not at All     How  many times did you typically get up at night to urinate? 2 Times     Total IPSS Score 10           Quality of Life due to urinary symptoms   If you were to spend the rest of your life with your urinary condition just the way it is now how would you feel about that? Mostly Satisfied              ROS:  ROS  Allergies  Allergen Reactions   Hydrocodone-Acetaminophen Other (See Comments)    Makes Timothy [name]. 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   RIGHT NASAL SIDEWALL- NO TX   BCC (basal cell carcinoma) 11/15/2004   LEFT CHEST- TX CURET X 3, 5FU   BCC (basal cell carcinoma) 11/15/2004   RIGHT EAR- TX CURET X 3, EXCISION   BCC (basal cell carcinoma) 08/13/2005   RIGHT EAR- TX MOHS   BCC (basal cell carcinoma) 04/01/2008   SUPERFICIAL- UPPER LEFT BACK- TX CURET X 3, 5FU   BCC (basal cell carcinoma) 12/23/2012   ULCERATED- ABOVE RIGHT LIP- TX MOHS   Cataract    Chronic kidney disease    Colon  polyp    Coronary artery disease    (left main normal, LAD 25-30% stenosis, distal 30-40% stenosis, circumflex obtuse marginal 50% stenosis,  right coronary artery dominant with long 75% followed by mid 80%  stenosis followed by 90% stenosis at the ostium of the PDA.  He had  stenting of the PDA by Timothy [name].  This was a Cypher stent.   First angioplasty was 1994, Cath 2011 100% RCA ).       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/27/2017   WELL DIFF- RIGHT TEMPLE-TX CURET X3,5FU   SCC (squamous cell carcinoma) 10/14/2019   WELL DIFF- RIGHT SHOULDER-TX CURET X 3, 5FU   SCC (squamous cell carcinoma) 01/24/2019   WELL DIFF- LEFT INNER CHEEK- MOHS   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   ABOVE LEFT OUTER BROW SUPERIOR- TX CURET X 3, 5FU   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   LEFT FOREHEAD-TX CURET X 3, 5FU   SCCA (squamous cell carcinoma) of skin 04/01/2008   RIGHT HAND- TX CURET X 3, 5FU   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   LEFT SIDE OF FACE- TX CURET AFTER BIOPSY   Sleep apnea    no c-pap   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   in situ- dorsum of nose (CX35FU)    Past Surgical History:  Procedure Laterality Date   APPENDECTOMY     CERVICAL SPINE SURGERY     CHOLECYSTECTOMY     COLONOSCOPY  2004   multiple since   CORONARY ANGIOPLASTY WITH STENT  PLACEMENT     3 sents   EYE SURGERY Bilateral    cataracts   HERNIA REPAIR Bilateral     SKIN CANCER EXCISION  03/17/2017   SKIN CANCER EXCISION  03/17/2020   left side of face    Social History   Socioeconomic History   Marital status: Married    Spouse name: Not on file   Number of children: 2   Years of education: Not on file   Highest education level: Not on file  Occupational History   Occupation: retired  Tobacco Use   Smoking status: Former    Packs/day: 2.00    Years: 25.00    Pack years: 50.00    Types: Cigarettes    Quit date: 08/14/1991    Years since quitting: 30.0   Smokeless tobacco: Never  Vaping Use   Vaping Use: Never used  Substance and Sexual Activity   Alcohol use: Yes    Comment: rare   Drug use: No   Sexual activity: Yes  Other Topics Concern   Not on file  Social History Narrative   Not on file   Social Determinants of Health   Financial Resource Strain: Not on file  Food Insecurity: Not on file  Transportation Needs: Not on file  Physical Activity: Not on file  Stress: Not on file  Social Connections: Not on file  Intimate Partner Violence: Not on file    Family History  Problem Relation Age of Onset   Cancer Mother        unsure of type   Glaucoma Mother    Heart disease Father    Coronary artery disease Father    Cancer Sister 96       granlocytosis (Wergerner's)   Colon cancer Brother 82   Nephritis Sister        20's   Cancer Brother        lung     Anti-infectives: Anti-infectives (From admission, onward)    None       Current Outpatient Medications  Medication Sig Dispense Refill   amLODipine (NORVASC) 10 MG tablet TAKE 1 TABLET ONCE DAILY 90 tablet 0   aspirin 81 MG EC tablet Take 81 mg by mouth daily.     blood glucose meter kit and supplies Dispense based on patient and insurance preference. Use up to four times daily as directed. (FOR ICD-10 E11.21) 1 each 0   carvedilol (COREG) 12.5 MG tablet TAKE (1) TABLET TWICE A DAY WITH MEALS (BREAKFAST AND SUPPER) 180 tablet 0   Choline Fenofibrate  (FENOFIBRIC ACID) 135 MG CPDR TAKE 1 CAPSULE BY MOUTH DAILY. 90 capsule 0   Empagliflozin-linaGLIPtin (GLYXAMBI) 10-5 MG TABS Take 1 tablet by mouth daily. 14 tablet 0   enalapril (VASOTEC) 2.5 MG tablet Take 1 tablet (2.5 mg total) by mouth daily. 90 tablet 3   esomeprazole (NEXIUM) 40 MG capsule TAKE (1) CAPSULE DAILY 90 capsule 0   furosemide (LASIX) 20 MG tablet Take 1/2 tablet (10 mg total) by mouth daily. 45 tablet 3   glucose blood test strip UAD to test BGs up to 4 times daily E11.21 100 each 12   icosapent Ethyl (VASCEPA) 1 g capsule Take 2 g by mouth 2 (two) times daily.     isosorbide mononitrate (IMDUR) 60 MG 24 hr tablet Take 1 tablet (60 mg total) by mouth daily. 90 tablet 3   Lancets MISC UAD to test BGs up to 4 times daily E11.21 100 each 12  latanoprost (XALATAN) 0.005 % ophthalmic solution Place 1 drop into both eyes at bedtime.     nitroGLYCERIN (NITROSTAT) 0.4 MG SL tablet Place 1 tablet (0.4 mg total) under the tongue every 5 (five) minutes as needed. 25 tablet 4   rosuvastatin (CRESTOR) 20 MG tablet TAKE 1 TABLET ONCE DAILY 90 tablet 3   Vitamin D, Ergocalciferol, (DRISDOL) 1.25 MG (50000 UNIT) CAPS capsule Take 1 capsule (50,000 Units total) by mouth every 7 (seven) days. 12 capsule 0   silodosin (RAPAFLO) 8 MG CAPS capsule Take 1 capsule (8 mg total) by mouth daily with breakfast. 90 capsule 3   No current facility-administered medications for this visit.     Objective: Vital signs in last 24 hours: BP 114/64   Pulse 79   Intake/Output from previous day: No intake/output data recorded. Intake/Output this shift: '@IOTHISSHIFT' @   Physical Exam  Lab Results:  Results for orders placed or performed in visit on 08/31/21 (from the past 24 hour(s))  Urinalysis, Routine w reflex microscopic     Status: Abnormal   Collection Time: 08/31/21  3:52 PM  Result Value Ref Range   Specific Gravity, UA 1.015 1.005 - 1.030   pH, UA 5.5 5.0 - 7.5   Color, UA Yellow Yellow    Appearance Ur Clear Clear   Leukocytes,UA Negative Negative   Protein,UA Negative Negative/Trace   Glucose, UA 3+ (A) Negative   Ketones, UA Negative Negative   RBC, UA Negative Negative   Bilirubin, UA Negative Negative   Urobilinogen, Ur 0.2 0.2 - 1.0 mg/dL   Nitrite, UA Negative Negative   Microscopic Examination Comment    Narrative   Performed at:  [facility] [address], [place], [place]  [id number] Lab Director: Mina Marble MT, Phone:  [phone]    BMET No results for input(s): NA, K, CL, CO2, GLUCOSE, BUN, CREATININE, CALCIUM in the last 72 hours. Timothy [name]/INR No results for input(s): LABPROT, INR in the last 72 hours. ABG No results for input(s): PHART, HCO3 in the last 72 hours.  Invalid input(s): PCO2, PO2  Studies/Results: UA has 0-2 RBC's.   Assessment/Plan: BPH with BOO and UUI with incomplete emptying.  He is doing better on silodosin with further improvement and his PVR is only 6m.   He will return in 6 months for a PVR.   Urethral stricture of the fossa.  This was dilated for scope passage.  He has a reasonable stream.  He didn't do a flowrate today as he was sent to the bathroom for a specimen first.    Meds ordered this encounter  Medications   silodosin (RAPAFLO) 8 MG CAPS capsule    Sig: Take 1 capsule (8 mg total) by mouth daily with breakfast.    Dispense:  90 capsule    Refill:  3      Orders Placed This Encounter  Procedures   Urinalysis, Routine w reflex microscopic   BLADDER SCAN AMB NON-IMAGING     Return in about 1 year (around 08/31/2022) for PVR on return. .  CC: Dr. AAdam Phenix       JIrine Seal9/15/2022 [id number]: [id number] [name] male   DOB: [birth year] 80y.o.   MRN: [id number]

## 2021-08-31 NOTE — Progress Notes (Signed)
post void residual=14  Urological Symptom Review  Patient is experiencing the following symptoms: Hard to postpone urination Get up at night to urinate Leakage of urine   Review of Systems  Gastrointestinal (upper)  : Negative for upper GI symptoms  Gastrointestinal (lower) : Negative for lower GI symptoms  Constitutional : Weight loss  Skin: Negative for skin symptoms  Eyes: Negative for eye symptoms  Ear/Nose/Throat : Negative for Ear/Nose/Throat symptoms  Hematologic/Lymphatic: Easy bruising  Cardiovascular : Negative for cardiovascular symptoms  Respiratory : Negative for respiratory symptoms  Endocrine: Negative for endocrine symptoms  Musculoskeletal: Negative for musculoskeletal symptoms  Neurological: Negative for neurological symptoms  Psychologic: Negative for psychiatric symptoms

## 2021-09-07 ENCOUNTER — Encounter: Payer: Self-pay | Admitting: Family

## 2021-09-07 ENCOUNTER — Ambulatory Visit (INDEPENDENT_AMBULATORY_CARE_PROVIDER_SITE_OTHER): Payer: Medicare HMO | Admitting: Family

## 2021-09-07 DIAGNOSIS — U071 COVID-19: Secondary | ICD-10-CM | POA: Diagnosis not present

## 2021-09-07 MED ORDER — FLUTICASONE PROPIONATE 50 MCG/ACT NA SUSP
2.0000 | Freq: Every day | NASAL | 6 refills | Status: DC
Start: 2021-09-07 — End: 2022-02-13

## 2021-09-07 MED ORDER — MOLNUPIRAVIR EUA 200MG CAPSULE
4.0000 | ORAL_CAPSULE | Freq: Two times a day (BID) | ORAL | 0 refills | Status: AC
Start: 2021-09-07 — End: 2021-09-12

## 2021-09-07 NOTE — Progress Notes (Signed)
   Virtual Visit  Note Due to COVID-19 pandemic this visit was conducted virtually. This visit type was conducted due to national recommendations for restrictions regarding the COVID-19 Pandemic (e.g. social distancing, sheltering in place) in an effort to limit this patient's exposure and mitigate transmission in our community. All issues noted in this document were discussed and addressed.  A physical exam was not performed with this format.  I connected with Timothy [name] on 09/07/21 at 9:54 AM  by telephone and verified that I am speaking with the correct person using two identifiers. Timothy [name] is currently located at home and no one  is currently with him during visit. The provider, Evelina Dun, FNP is located in their office at time of visit.  I discussed the limitations, risks, security and privacy concerns of performing an evaluation and management service by telephone and the availability of in person appointments. I also discussed with the patient that there may be a patient responsible charge related to this service. The patient expressed understanding and agreed to proceed.   History and Present Illness:  PT calls the office today with positive COVID and his symptoms started yesterday.  Cough This is a new problem. The current episode started yesterday. The problem has been waxing and waning. The cough is Non-productive. Associated symptoms include headaches and nasal congestion. Pertinent negatives include no chills, ear congestion, ear pain, fever, myalgias, shortness of breath or wheezing. He has tried rest for the symptoms. The treatment provided mild relief.     Review of Systems  Constitutional:  Negative for chills and fever.  HENT:  Negative for ear pain.   Respiratory:  Positive for cough. Negative for shortness of breath and wheezing.   Musculoskeletal:  Negative for myalgias.  Neurological:  Positive for headaches.  All other systems reviewed and are  negative.   Observations/Objective: No SOB or distress noted   Assessment and Plan: 1. COVID-19 virus detected COVID positive, rest, force fluids, tylenol as needed, Quarantine for at least 5 days and fever free, report any worsening symptoms such as increased shortness of breath, swelling, or continued high fevers.  Possible adverse effects discussed  - molnupiravir EUA (LAGEVRIO) 200 mg CAPS capsule; Take 4 capsules (800 mg total) by mouth 2 (two) times daily for 5 days.  Dispense: 40 capsule; Refill: 0 - fluticasone (FLONASE) 50 MCG/ACT nasal spray; Place 2 sprays into both nostrils daily.  Dispense: 16 g; Refill: 6    I discussed the assessment and treatment plan with the patient. The patient was provided an opportunity to ask questions and all were answered. The patient agreed with the plan and demonstrated an understanding of the instructions.   The patient was advised to call back or seek an in-person evaluation if the symptoms worsen or if the condition fails to improve as anticipated.  The above assessment and management plan was discussed with the patient. The patient verbalized understanding of and has agreed to the management plan. Patient is aware to call the clinic if symptoms persist or worsen. Patient is aware when to return to the clinic for a follow-up visit. Patient educated on when it is appropriate to go to the emergency department.   Time call ended:  10:05 AM   I provided 11 minutes of  non face-to-face time during this encounter.    Evelina Dun, FNP

## 2021-09-11 ENCOUNTER — Other Ambulatory Visit: Payer: Self-pay | Admitting: Family Medicine

## 2021-09-11 ENCOUNTER — Other Ambulatory Visit: Payer: Self-pay | Admitting: Cardiology

## 2021-09-14 ENCOUNTER — Ambulatory Visit (INDEPENDENT_AMBULATORY_CARE_PROVIDER_SITE_OTHER): Payer: Medicare HMO | Admitting: Pharmacist

## 2021-09-14 DIAGNOSIS — E1121 Type 2 diabetes mellitus with diabetic nephropathy: Secondary | ICD-10-CM

## 2021-09-14 NOTE — Patient Instructions (Signed)
Visit Information  PATIENT GOALS:  Goals Addressed               This Visit's Progress     Patient Stated     T2DM PHARMD GOAL (pt-stated)        Current Barriers:  Unable to independently afford treatment regimen Unable to achieve control of T2DM  Suboptimal therapeutic regimen for T2DM   Pharmacist Clinical Goal(s):  Over the next 90 days, patient will verbalize ability to afford treatment regimen achieve control of T2DM as evidenced by IMPROVED GLYCEMIC CONTROL through collaboration with PharmD and provider.    Interventions: 1:1 collaboration with Janora Norlander, DO regarding development and update of comprehensive plan of care as evidenced by provider attestation and co-signature Inter-disciplinary care team collaboration (see longitudinal plan of care) Comprehensive medication review performed; medication list updated in electronic medical record  Diabetes: Uncontrolled;A1C 8.2%, GFR 53; current treatment:GLYXAMBI 10/5MG ;  CURRENTLY USING SAMPLES UNTIL PATIENT ASSISTANCE APPROVED APPLICATION [place] for (QTERN 10/5--alternative to glyxambi & easier to get) Tolerating well; denies side effects Reports BG is better controlled on glyxambi  Current glucose readings: fasting glucose: <140, post prandial glucose: n/a Denies hypoglycemic/hyperglycemic symptoms Discussed meal planning options and Plate method for healthy eating Avoid sugary drinks and desserts Incorporate balanced protein, non starchy veggies, 1 serving of carbohydrate with each meal Increase water intake Increase physical activity as able Current exercise: n/a Recommended transition to Qtern once available--will update med list at that time Assessed patient finances. [place] patient [place] for QTERN--will ship to patient's home (samples of glyxambi left up front for patient to pick up)   Patient Goals/Self-Care Activities Over the  next 90 days, patient will:  - take medications as prescribed check glucose daily (fasting), document, and provide at future appointments collaborate with provider on medication access solutions  Follow Up Plan: Telephone follow up appointment with care management team member scheduled for: 6 weeks         The patient verbalized understanding of instructions, educational materials, and care plan provided today and declined offer to receive copy of patient instructions, educational materials, and care plan.   Telephone follow up appointment with care management team member scheduled for: 6 WEEKS  SignatureJulie Reyes [name], PharmD, BCPS Clinical Pharmacist, [facility] Phone [phone]

## 2021-09-14 NOTE — Progress Notes (Signed)
Chronic Care Management Pharmacy Note  09/14/2021 Name:  Timothy [name] MRN:  [id number] DOB:  1940-12-19  Summary: T2DM  Recommendations/Changes made from today's visit: Diabetes: Uncontrolled-A1C 8.2%, GFR 53; current treatment:GLYXAMBI 10/5MG;  CURRENTLY USING SAMPLES UNTIL PATIENT ASSISTANCE APPROVED APPLICATION SUBM[place]ED [place] patient assistance program for (QTERN 10/5--alternative to glyxambi & easier to get) Tolerating well; denies side effects Reports BG is better controlled on glyxambi  Current glucose readings: fasting glucose: <140, post prandial glucose: n/a Denies hypoglycemic/hyperglycemic symptoms Discussed meal planning options and Plate method for healthy eating Avoid sugary drinks and desserts Incorporate balanced protein, non starchy veggies, 1 serving of carbohydrate with each meal Increase water intake Increase physical activity as able Current exercise: n/a Recommended transition to Qtern once available--will update med list at that time Assessed patient finances. Application submitted to [place] patient assistance for QTERN--will ship to patient's home (samples of glyxambi left up front for patient to pick up)  Follow Up Plan: Telephone follow up appointment with care management team member scheduled for: 6 weeks  Subjective: Timothy [name] is an 80 y.o. year old male who is a primary patient of Janora Norlander, DO.  The CCM team was consulted for assistance with disease management and care coordination needs.    Engaged with patient by telephone for initial visit in response to provider referral for pharmacy case management and/or care coordination services.   Consent to Services:  The patient was given the following information about Chronic Care Management services today, agreed to services, and gave verbal consent: 1. CCM service includes personalized support from designated clinical staff supervised by the primary care provider, including  individualized plan of care and coordination with other care providers 2. 24/7 contact phone numbers for assistance for urgent and routine care needs. 3. Service will only be billed when office clinical staff spend 20 minutes or more in a month to coordinate care. 4. Only one practitioner may furnish and bill the service in a calendar month. 5.The patient may stop CCM services at any time (effective at the end of the month) by phone call to the office staff. 6. The patient will be responsible for cost sharing (co-pay) of up to 20% of the service fee (after annual deductible is met). Patient agreed to services and consent obtained.  Patient Care Team: Janora Norlander, DO as PCP - General (Family Medicine) Minus Breeding, MD as PCP - Cardiology (Cardiology) Clent Jacks, MD as Consulting Physician (Ophthalmology) Minus Breeding, MD as Consulting Physician (Cardiology) Chesley Mires, MD as Consulting Physician (Pulmonary Disease) Lavonna Monarch, MD as Consulting Physician (Dermatology) Lavera Guise, [facility] as Pharmacist (Family Medicine)   Objective:  Lab Results  Component Value Date   CREATININE 1.35 (H) 08/04/2021   CREATININE 1.51 (H) 04/13/2021   CREATININE 1.49 (H) 06/27/2020    Lab Results  Component Value Date   HGBA1C 8.2 (H) 08/04/2021   Last diabetic Eye exam:  Lab Results  Component Value Date/Time   HMDIABEYEEXA No Retinopathy 05/31/2021 04:11 PM    Last diabetic Foot exam:  Lab Results  Component Value Date/Time   HMDIABFOOTEX done 01/03/2011 12:00 AM        Component Value Date/Time   CHOL 133 01/31/2021 0823   TRIG 398 (H) 01/31/2021 0823   TRIG 538 (H) 09/22/2016 0806   HDL 21 (L) 01/31/2021 0823   HDL 15 (L) 09/22/2016 0806   CHOLHDL 6.3 (H) 01/31/2021 0823   CHOLHDL 4.9 04/08/2013 0827   VLDL 55 (  H) 04/08/2013 0827   LDLCALC 52 01/31/2021 0823   LDLCALC 48 07/16/2014 0818   LDLDIRECT 88 03/01/2020 0000    Hepatic Function Latest Ref Rng & Units  08/04/2021 04/13/2021 06/27/2020  Total Protein 6.0 - 8.5 g/dL - - 6.8  Albumin 3.7 - 4.7 g/dL 4.2 4.2 4.1  AST 0 - 40 IU/L - - 21  ALT 0 - 44 IU/L - - 17  Alk Phosphatase 48 - 121 IU/L - - 45(L)  Total Bilirubin 0.0 - 1.2 mg/dL - - 0.5  Bilirubin, Direct 0.00 - 0.40 mg/dL - - -    Lab Results  Component Value Date/Time   TSH 3.430 06/27/2020 08:14 AM   TSH 3.030 03/23/2020 12:29 PM    CBC Latest Ref Rng & Units 04/27/2021 04/13/2021 06/27/2020  WBC 3.4 - 10.8 x10E3/uL 5.5 5.4 5.9  Hemoglobin 13.0 - 17.7 g/dL 13.2 12.1(L) 12.7(L)  Hematocrit 37.5 - 51.0 % 38.9 35.7(L) 36.1(L)  Platelets 150 - 450 x10E3/uL 168 142(L) 172    Lab Results  Component Value Date/Time   VD25OH 15.4 (L) 04/13/2021 02:43 PM   VD25OH 15.2 (L) 04/22/2018 09:52 AM    Clinical ASCVD: No  The ASCVD Risk score (Arnett DK, et al., 2019) failed to calculate for the following reasons:   The 2019 ASCVD risk score is only valid for ages 34 to 29    Other: (CHADS2VASc if Afib, PHQ9 if depression, MMRC or CAT for COPD, ACT, DEXA)  Social History   Tobacco Use  Smoking Status Former   Packs/day: 2.00   Years: 25.00   Pack years: 50.00   Types: Cigarettes   Quit date: 08/14/1991   Years since quitting: 30.1  Smokeless Tobacco Never   BP Readings from Last 3 Encounters:  08/31/21 114/64  08/04/21 139/66  04/13/21 123/60   Pulse Readings from Last 3 Encounters:  08/31/21 79  08/04/21 70  04/13/21 71   Wt Readings from Last 3 Encounters:  08/04/21 180 lb (81.6 kg)  04/13/21 193 lb 12.8 oz (87.9 kg)  03/16/21 192 lb (87.1 kg)    Assessment: Review of patient past medical history, allergies, medications, health status, including review of consultants reports, laboratory and other test data, was performed as part of comprehensive evaluation and provision of chronic care management services.   SDOH:  (Social Determinants of Health) assessments and interventions performed:    CCM Care Plan  Allergies   Allergen Reactions   Hydrocodone-Acetaminophen Other (See Comments)    Makes pt. Feel loopy    Medications Reviewed Today     Reviewed by Lavera Guise, [facility] (Pharmacist) on 09/14/21 at [phone]  Med List Status: <None>   Medication Order Taking? Sig Documenting Provider Last Dose Status Informant  amLODipine (NORVASC) 10 MG tablet [id number] No TAKE 1 TABLET ONCE DAILY Janora Norlander, DO Taking Active   aspirin 81 MG EC tablet [id number] No Take 81 mg by mouth daily. [provider] Taking Active Self  blood glucose meter kit and supplies [id number] No Dispense based on patient and insurance preference. Use up to four times daily as directed. (FOR ICD-10 E11.21) [name], DO Taking Active   carvedilol (COREG) 12.5 MG tablet [id number] No TAKE (1) TABLET TWICE A DAY WITH MEALS (BREAKFAST AND SUPPER) [name], DO Taking Active   Choline Fenofibrate (FENOFIBRIC ACID) 135 MG CPDR [id number] No TAKE 1 CAPSULE BY MOUTH DAILY. [name], DO Taking Active   Empagliflozin-linaGLIPtin (GLYXAMBI) 10-5 MG TABS  [id number] No Take 1 tablet by mouth daily. Janora Norlander, DO Taking Active            Med Note Blanca Friend, Sherian Maroon Aug 17, 2021 10:41 AM) Samples given  enalapril (VASOTEC) 2.5 MG tablet [id number] No Take 1 tablet (2.5 mg total) by mouth daily. [name], DO Taking Active   esomeprazole (NEXIUM) 40 MG capsule [id number] No TAKE (1) CAPSULE DAILY [name], DO Taking Active   fluticasone (FLONASE) 50 MCG/ACT nasal spray [id number]  Place 2 sprays into both nostrils daily. Evelina Dun A, FNP  Active   furosemide (LASIX) 20 MG tablet [id number] No Take 1/2 tablet (10 mg total) by mouth daily. [name], DO Taking Active   glucose blood test strip [id number] No UAD to test BGs up to 4 times daily E11.21 [name], DO Taking Active   isosorbide mononitrate (IMDUR) 60 MG 24 hr tablet [id number]  TAKE 1 TABLET DAILY  Pixie Casino, MD  Active   Lancets MISC [id number] No UAD to test BGs up to 4 times daily E11.21 [name], DO Taking Active   latanoprost (XALATAN) 0.005 % ophthalmic solution [id number] No Place 1 drop into both eyes at bedtime. [provider] Taking Active Self  nitroGLYCERIN (NITROSTAT) 0.4 MG SL tablet [id number] No Place 1 tablet (0.4 mg total) under the tongue every 5 (five) minutes as needed. Minus Breeding, MD Taking Active   rosuvastatin (CRESTOR) 20 MG tablet [id number] No TAKE 1 TABLET ONCE DAILY Hilty, Nadean Corwin, MD Taking Active   silodosin (RAPAFLO) 8 MG CAPS capsule [id number]  Take 1 capsule (8 mg total) by mouth daily with breakfast. Irine Seal, MD  Active   VASCEPA 1 g capsule [id number]  Take 2 capsules (2 g total) by mouth 2 (two) times daily. [name], DO  Active   Vitamin D, Ergocalciferol, (DRISDOL) 1.25 MG (50000 UNIT) CAPS capsule [id number] No Take 1 capsule (50,000 Units total) by mouth every 7 (seven) days. Janora Norlander, DO Taking Active             Patient Active Problem List   Diagnosis Date Noted   Sleep apnea 04/05/2020   Stage 3a chronic kidney disease ([place]) 04/05/2020   Hypertension associated with type 2 diabetes mellitus ([place]) 05/26/2019   Gastroesophageal reflux disease without esophagitis 05/26/2019   Coronary artery disease involving native coronary artery of native heart without angina pectoris 04/15/2019   Long-term use of high-risk medication 04/15/2019   Educated about COVID-19 virus infection 04/15/2019   Hyperlipidemia associated with type 2 diabetes mellitus ([place]) 10/09/2018   Enteritis 08/15/2018   AKI (acute kidney injury) ([place]) 08/15/2018   Thrombocytopenia ([place]) 09/23/2016   Elevated triglycerides with high cholesterol 10/18/2015   CKD stage 3 due to type 2 diabetes mellitus ([place]) 05/13/2015   Glaucoma 12/27/2014   Overweight(278.02) 06/27/2011   ASCVD (arteriosclerotic cardiovascular disease)     Benign prostatic hyperplasia    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    Type 2 diabetes mellitus with diabetic nephropathy ([place]) 05/24/2010   Hyperlipidemia LDL goal <70 05/13/2009   Coronary atherosclerosis 05/13/2009    Immunization History  Administered Date(s) Administered   Fluad Quad(high Dose 65+) 10/18/2020   Influenza Whole 08/17/2010   Influenza, High Dose Seasonal PF 09/21/2016, 09/25/2017, 09/24/2018   Influenza,inj,Quad PF,6+ Mos 09/28/2013, 10/11/2014, 10/10/2015, 11/06/2019   Moderna Sars-Covid-2 Vaccination 02/15/2020, 03/14/2020, 11/08/2020   Pneumococcal Conjugate-13 10/11/2014  Pneumococcal Polysaccharide-23 06/16/2008   Td 02/15/2003, 09/20/2011   Tdap 09/17/2011   Zoster, Live 05/18/2007    Conditions to be addressed/monitored: DMII  Care Plan : PHARMD MEDICATION MANAGEMENT  Updates made by Lavera Guise, RPH since 09/14/2021 12:00 AM     Problem: DISEASE PROGRESSION PREVENTION      Long-Range Goal: T2DM   This Visit's Progress: Not on track  Priority: High  Note:   Current Barriers:  Unable to independently afford treatment regimen Unable to achieve control of T2DM  Suboptimal therapeutic regimen for T2DM   Pharmacist Clinical Goal(s):  Over the next 90 days, patient will verbalize ability to afford treatment regimen achieve control of T2DM as evidenced by IMPROVED GLYCEMIC CONTROL  through collaboration with PharmD and provider.    Interventions: 1:1 collaboration with Janora Norlander, DO regarding development and update of comprehensive plan of care as evidenced by provider attestation and co-signature Inter-disciplinary care team collaboration (see longitudinal plan of care) Comprehensive medication review performed; medication list updated in electronic medical record  Diabetes: Uncontrolled-A1C 8.2%, GFR 53; current treatment:GLYXAMBI 10/5MG;  CURRENTLY USING SAMPLES UNTIL PATIENT ASSISTANCE  APPROVED APPLICATION SUBM[place]ED [place] patient assistance program for (QTERN 10/5--alternative to glyxambi & easier to get) Tolerating well; denies side effects Reports BG is better controlled on glyxambi  Current glucose readings: fasting glucose: <140, post prandial glucose: n/a Denies hypoglycemic/hyperglycemic symptoms Discussed meal planning options and Plate method for healthy eating Avoid sugary drinks and desserts Incorporate balanced protein, non starchy veggies, 1 serving of carbohydrate with each meal Increase water intake Increase physical activity as able Current exercise: n/a Recommended transition to Qtern once available--will update med list at that time Assessed patient finances. Application submitted to [place] patient assistance for QTERN--will ship to patient's home (samples of glyxambi left up front for patient to pick up)   Patient Goals/Self-Care Activities Over the next 90 days, patient will:  - take medications as prescribed check glucose daily (fasting), document, and provide at future appointments collaborate with provider on medication access solutions  Follow Up Plan: Telephone follow up appointment with care management team member scheduled for: 6 weeks      Medication Assistance: Application for QTERN/[place]   medication assistance program. in process.  Anticipated assistance start date TBD.  See plan of care for additional detail.  Patient's preferred pharmacy is:  [place], [place] [address] [place] [address] Phone: [phone] Fax: [place], [place] [address] [place] [address] Phone: [phone] Fax: [phone]   Follow Up:  Patient agrees to Care Plan and Follow-up.  Plan: Telephone follow up appointment with care management team member scheduled for:  6 WEEKS  Regina Eck, PharmD, BCPS Clinical Pharmacist,  [place]  [facility] Phone [phone]

## 2021-09-15 DIAGNOSIS — E1121 Type 2 diabetes mellitus with diabetic nephropathy: Secondary | ICD-10-CM

## 2021-10-02 ENCOUNTER — Telehealth: Payer: Self-pay | Admitting: Family Medicine

## 2021-10-05 ENCOUNTER — Telehealth: Payer: Self-pay | Admitting: Family Medicine

## 2021-10-05 NOTE — Telephone Encounter (Signed)
Left message for patient to call back and schedule Medicare Annual Wellness Visit    Last AWV 08/15/16  please schedule at anytime with health coach  This should be a 45 minute visit.

## 2021-10-09 ENCOUNTER — Other Ambulatory Visit: Payer: Self-pay | Admitting: Family Medicine

## 2021-10-09 DIAGNOSIS — E1159 Type 2 diabetes mellitus with other circulatory complications: Secondary | ICD-10-CM

## 2021-10-10 NOTE — Telephone Encounter (Signed)
Lmtcb 10/17 [facility] per note  Lmtcb No call back  This encounter will be closed

## 2021-10-16 ENCOUNTER — Other Ambulatory Visit: Payer: Self-pay | Admitting: Family Medicine

## 2021-10-16 DIAGNOSIS — E1159 Type 2 diabetes mellitus with other circulatory complications: Secondary | ICD-10-CM

## 2021-10-18 ENCOUNTER — Telehealth: Payer: Self-pay | Admitting: Family Medicine

## 2021-10-18 NOTE — Telephone Encounter (Signed)
Appt scheduled with pharmd tomorrow AM

## 2021-10-19 ENCOUNTER — Ambulatory Visit (INDEPENDENT_AMBULATORY_CARE_PROVIDER_SITE_OTHER): Payer: Medicare HMO | Admitting: Pharmacist

## 2021-10-19 ENCOUNTER — Other Ambulatory Visit: Payer: Self-pay

## 2021-10-19 DIAGNOSIS — E1121 Type 2 diabetes mellitus with diabetic nephropathy: Secondary | ICD-10-CM

## 2021-10-19 MED ORDER — DAPAGLIFLOZIN PROPANEDIOL 10 MG PO TABS: 10.0000 mg | ORAL_TABLET | Freq: Every day | ORAL | 4 refills | Status: DC

## 2021-10-19 NOTE — Progress Notes (Signed)
Chronic Care Management Pharmacy Note  10/19/2021 Name:  Timothy [name] MRN:  [id number] DOB:  1941-06-16  Summary: T2DM  Recommendations/Changes made from today's visit: Diabetes: Uncontrolled-A1C 8.2%, GFR 53; current treatment:GLYXAMBI 10/5MG-->Farxiga 12m;  CURRENTLY USING GLYXAMBI 10/571mSAMPLES  Patient was approved for QTERN and received, but he does not wish to take another combination medication.  He reports that Glyxambi is not helping any more than FaIranlone did.   Application re-submitted for Farxiga 1012maily Samples (30-day supply) given to patient will we await change Tolerating well; denies side effects Discussed with patient we may have to add another medication at some point if BGs not controlled Current glucose readings: fasting glucose: <190, post prandial glucose: n/a Denies hypoglycemic/hyperglycemic symptoms Discussed meal planning options and Plate method for healthy eating Avoid sugary drinks and desserts Incorporate balanced protein, non starchy veggies, 1 serving of carbohydrate with each meal Increase water intake Increase physical activity as able Current exercise: n/a Recommended transition back to [employer] finances. [place] for FARXIGA-Will ship to patient's home (samples of [place])  Follow Up Plan: Telephone follow up appointment with care management team member scheduled for: 6 weeks   Subjective: Timothy [name] an 80 18o. year old male who is a primary patient of GotJanora NorlanderO.  The CCM team was consulted for assistance with disease management and care coordination needs.    Engaged with patient face to face for follow up visit in response to provider referral for pharmacy case management and/or care coordination services.   Consent to Services:  The patient was given information about Chronic Care Management services, agreed to services, and gave  verbal consent prior to initiation of services.  Please see initial visit note for detailed documentation.   Patient Care Team: GotJanora NorlanderO as PCP - General (Family Medicine) HocMinus BreedingD as PCP - Cardiology (Cardiology) GroClent JacksD as Consulting Physician (Ophthalmology) HocMinus BreedingD as Consulting Physician (Cardiology) SooChesley MiresD as Consulting Physician (Pulmonary Disease) TafLavonna MonarchD as Consulting Physician (Dermatology) [facility] Pharmacist (Family Medicine)  Objective:  Lab Results  Component Value Date   CREATININE 1.35 (H) 08/04/2021   CREATININE 1.51 (H) 04/13/2021   CREATININE 1.49 (H) 06/27/2020    Lab Results  Component Value Date   HGBA1C 8.2 (H) 08/04/2021   Last diabetic Eye exam:  Lab Results  Component Value Date/Time   HMDIABEYEEXA No Retinopathy 05/31/2021 04:11 PM    Last diabetic Foot exam:  Lab Results  Component Value Date/Time   HMDIABFOOTEX done 01/03/2011 12:00 AM        Component Value Date/Time   CHOL 133 01/31/2021 0823   TRIG 398 (H) 01/31/2021 0823   TRIG 538 (H) 09/22/2016 0806   HDL 21 (L) 01/31/2021 0823   HDL 15 (L) 09/22/2016 0806   CHOLHDL 6.3 (H) 01/31/2021 0823   CHOLHDL 4.9 04/08/2013 0827   VLDL 55 (H) 04/08/2013 0827   LDLCALC 52 01/31/2021 0823   LDLCALC 48 07/16/2014 0818   LDLDIRECT 88 03/01/2020 0000    Hepatic Function Latest Ref Rng & Units 08/04/2021 04/13/2021 06/27/2020  Total Protein 6.0 - 8.5 g/dL - - 6.8  Albumin 3.7 - 4.7 g/dL 4.2 4.2 4.1  AST 0 - 40 IU/L - - 21  ALT 0 - 44 IU/L - - 17  Alk Phosphatase 48 - 121 IU/L - - 45(L)  Total Bilirubin 0.0 -  1.2 mg/dL - - 0.5  Bilirubin, Direct 0.00 - 0.40 mg/dL - - -    Lab Results  Component Value Date/Time   TSH 3.430 06/27/2020 08:14 AM   TSH 3.030 03/23/2020 12:29 PM    CBC Latest Ref Rng & Units 04/27/2021 04/13/2021 06/27/2020  WBC 3.4 - 10.8 x10E3/uL 5.5 5.4 5.9  Hemoglobin 13.0 - 17.7 g/dL 13.2  12.1(L) 12.7(L)  Hematocrit 37.5 - 51.0 % 38.9 35.7(L) 36.1(L)  Platelets 150 - 450 x10E3/uL 168 142(L) 172    Lab Results  Component Value Date/Time   VD25OH 15.4 (L) 04/13/2021 02:43 PM   VD25OH 15.2 (L) 04/22/2018 09:52 AM    Clinical ASCVD: No  The ASCVD Risk score (Arnett DK, et al., 2019) failed to calculate for the following reasons:   The 2019 ASCVD risk score is only valid for ages 80 to 76    Other: (CHADS2VASc if Afib, PHQ9 if depression, MMRC or CAT for COPD, ACT, DEXA)  Social History   Tobacco Use  Smoking Status Former   Packs/day: 2.00   Years: 25.00   Pack years: 50.00   Types: Cigarettes   Quit date: 08/14/1991   Years since quitting: 30.2  Smokeless Tobacco Never   BP Readings from Last 3 Encounters:  08/31/21 114/64  08/04/21 139/66  04/13/21 123/60   Pulse Readings from Last 3 Encounters:  08/31/21 79  08/04/21 70  04/13/21 71   Wt Readings from Last 3 Encounters:  08/04/21 180 lb (81.6 kg)  04/13/21 193 lb 12.8 oz (87.9 kg)  03/16/21 192 lb (87.1 kg)    Assessment: Review of patient past medical history, allergies, medications, health status, including review of consultants reports, laboratory and other test data, was performed as part of comprehensive evaluation and provision of chronic care management services.   SDOH:  (Social Determinants of Health) assessments and interventions performed:    CCM Care Plan  Allergies  Allergen Reactions   Hydrocodone-Acetaminophen Other (See Comments)    Makes pt. Feel loopy    Medications Reviewed Today     Reviewed by Lavera Guise, [facility] (Pharmacist) on 10/19/21 at [place] List Status: <None>   Medication Order Taking? Sig Documenting Provider Last Dose Status Informant  amLODipine (NORVASC) 10 MG tablet [id number]  TAKE 1 TABLET ONCE DAILY [name], DO  Active   aspirin 81 MG EC tablet [id number] No Take 81 mg by mouth daily. [provider] Taking Active Self  blood  glucose meter kit and supplies [id number] No Dispense based on patient and insurance preference. Use up to four times daily as directed. (FOR ICD-10 E11.21) [name], DO Taking Active   carvedilol (COREG) 12.5 MG tablet [id number]  TAKE (1) TABLET TWICE A DAY WITH MEALS (BREAKFAST AND SUPPER) [name], DO  Active   Choline Fenofibrate (FENOFIBRIC ACID) 135 MG CPDR [id number] No TAKE 1 CAPSULE BY MOUTH DAILY. Janora Norlander, DO Taking Active   Discontinued 10/19/21 0845 (Change in therapy)            Med Note (Brien Lowe D   Thu Aug 17, 2021 10:41 AM) Samples given  enalapril (VASOTEC) 2.5 MG tablet [id number] No Take 1 tablet (2.5 mg total) by mouth daily. [name], DO Taking Active   esomeprazole (NEXIUM) 40 MG capsule [id number] No TAKE (1) CAPSULE DAILY [name], DO Taking Active   fluticasone (FLONASE) 50 MCG/ACT nasal spray [id number]  Place 2 sprays into both nostrils daily.  Evelina Dun A, FNP  Active   furosemide (LASIX) 20 MG tablet [id number] No Take 1/2 tablet (10 mg total) by mouth daily. [name], DO Taking Active   glucose blood test strip [id number] No UAD to test BGs up to 4 times daily E11.21 [name], DO Taking Active   isosorbide mononitrate (IMDUR) 60 MG 24 hr tablet [id number]  TAKE 1 TABLET DAILY Pixie Casino, MD  Active   Lancets MISC [id number] No UAD to test BGs up to 4 times daily E11.21 [name], DO Taking Active   latanoprost (XALATAN) 0.005 % ophthalmic solution [id number] No Place 1 drop into both eyes at bedtime. [provider] Taking Active Self  nitroGLYCERIN (NITROSTAT) 0.4 MG SL tablet [id number] No Place 1 tablet (0.4 mg total) under the tongue every 5 (five) minutes as needed. Minus Breeding, MD Taking Active   rosuvastatin (CRESTOR) 20 MG tablet [id number] No TAKE 1 TABLET ONCE DAILY Hilty, Nadean Corwin, MD Taking Active   silodosin (RAPAFLO) 8 MG CAPS capsule [id number]  Take 1  capsule (8 mg total) by mouth daily with breakfast. Irine Seal, MD  Active   VASCEPA 1 g capsule [id number]  Take 2 capsules (2 g total) by mouth 2 (two) times daily. [name], DO  Active   Vitamin D, Ergocalciferol, (DRISDOL) 1.25 MG (50000 UNIT) CAPS capsule [id number] No Take 1 capsule (50,000 Units total) by mouth every 7 (seven) days. Janora Norlander, DO Taking Active             Patient Active Problem List   Diagnosis Date Noted   Sleep apnea 04/05/2020   Stage 3a chronic kidney disease ([place]) 04/05/2020   Hypertension associated with type 2 diabetes mellitus ([place]) 05/26/2019   Gastroesophageal reflux disease without esophagitis 05/26/2019   Coronary artery disease involving native coronary artery of native heart without angina pectoris 04/15/2019   Long-term use of high-risk medication 04/15/2019   Educated about COVID-19 virus infection 04/15/2019   Hyperlipidemia associated with type 2 diabetes mellitus ([place]) 10/09/2018   Enteritis 08/15/2018   AKI (acute kidney injury) ([place]) 08/15/2018   Thrombocytopenia ([place]) 09/23/2016   Elevated triglycerides with high cholesterol 10/18/2015   CKD stage 3 due to type 2 diabetes mellitus ([place]) 05/13/2015   Glaucoma 12/27/2014   Overweight(278.02) 06/27/2011   ASCVD (arteriosclerotic cardiovascular disease)    Benign prostatic hyperplasia    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    Type 2 diabetes mellitus with diabetic nephropathy ([place]) 05/24/2010   Hyperlipidemia LDL goal <70 05/13/2009   Coronary atherosclerosis 05/13/2009    Immunization History  Administered Date(s) Administered   Fluad Quad(high Dose 65+) 10/18/2020   Influenza Whole 08/17/2010   Influenza, High Dose Seasonal PF 09/21/2016, 09/25/2017, 09/24/2018   Influenza,inj,Quad PF,6+ Mos 09/28/2013, 10/11/2014, 10/10/2015, 11/06/2019   Moderna Sars-Covid-2 Vaccination 02/15/2020, 03/14/2020, 11/08/2020   Pneumococcal Conjugate-13  10/11/2014   Pneumococcal Polysaccharide-23 06/16/2008   Td 02/15/2003, 09/20/2011   Tdap 09/17/2011   Zoster, Live 05/18/2007    Conditions to be addressed/monitored: DMII  Care Plan : PHARMD MEDICATION MANAGEMENT  Updates made by Lavera Guise, [place] since 10/19/2021 12:00 AM     Problem: DISEASE PROGRESSION PREVENTION      Long-Range Goal: T2DM   Recent Progress: Not on track  Priority: High  Note:   Current Barriers:  Unable to independently afford treatment regimen Unable to achieve control of T2DM  Suboptimal therapeutic regimen for T2DM  Pharmacist  Clinical Goal(s):  Over the next 90 days, patient will verbalize ability to afford treatment regimen achieve control of T2DM as evidenced by IMPROVED GLYCEMIC CONTROL  through collaboration with PharmD and provider.    Interventions: 1:1 collaboration with Janora Norlander, DO regarding development and update of comprehensive plan of care as evidenced by provider attestation and co-signature Inter-disciplinary care team collaboration (see longitudinal plan of care) Comprehensive medication review performed; medication list updated in electronic medical record  Diabetes: Uncontrolled-A1C 8.2%, GFR 53; current treatment:GLYXAMBI 10/5MG-->Farxiga 32m;  CURRENTLY USING GLYXAMBI 10/522mSAMPLES  Patient was approved for QTERN and received, but he does not wish to take another combination medication.  He reports that Glyxambi is not helping any more than FaIranlone did.   Application re-submitted for Farxiga 1049maily Samples (30-day supply) given to patient will we await change Tolerating well; denies side effects Discussed with patient we may have to add another medication at some point if BGs not controlled Current glucose readings: fasting glucose: <190, post prandial glucose: n/a Denies hypoglycemic/hyperglycemic symptoms Discussed meal planning options and Plate method for healthy eating Avoid sugary drinks and  desserts Incorporate balanced protein, non starchy veggies, 1 serving of carbohydrate with each meal Increase water intake Increase physical activity as able Current exercise: n/a Recommended transition back to [employer] finances. [place] patient [place] for FARXIGA-Will ship to patient's home (samples of FARFolly [place]ient)   Patient Goals/Self-Care Activities Over the next 90 days, patient will:  - take medications as prescribed check glucose daily (fasting), document, and provide at future appointments collaborate with provider on medication access solutions  Follow Up Plan: Telephone follow up appointment with care management team member scheduled for: 6 weeks      Medication Assistance: Application for [facility] assistance program. in process.  Anticipated assistance start date TBD.  See plan of care for additional detail.  Patient's preferred pharmacy is:  [address] [id number]: [phone]: [address] [place]one: [phone]: [phone] Up:  Patient agrees to Care Plan and Follow-up.  Plan: Telephone follow up appointment with care management team member scheduled for:  11/15/21  JulRegina EckharmD, BCPS Clinical Pharmacist, [place] Phone [phone]

## 2021-10-19 NOTE — Patient Instructions (Signed)
Visit Information  The patient verbalized understanding of instructions, educational materials, and care plan provided today and declined offer to receive copy of patient instructions, educational materials, and care plan.   Telephone follow up appointment with care management team member scheduled for: 1 month  Signature Regina Eck, PharmD, BCPS Clinical Pharmacist, [place]  II Phone [phone]

## 2021-10-24 ENCOUNTER — Telehealth: Payer: Medicare HMO

## 2021-10-30 ENCOUNTER — Other Ambulatory Visit: Payer: Self-pay | Admitting: Family Medicine

## 2021-10-30 DIAGNOSIS — E1169 Type 2 diabetes mellitus with other specified complication: Secondary | ICD-10-CM

## 2021-11-15 ENCOUNTER — Telehealth: Payer: Medicare HMO

## 2021-11-15 DIAGNOSIS — E1121 Type 2 diabetes mellitus with diabetic nephropathy: Secondary | ICD-10-CM

## 2021-11-20 ENCOUNTER — Telehealth: Payer: Self-pay | Admitting: Dermatology

## 2021-11-20 NOTE — Telephone Encounter (Signed)
Patient sent a MyChart message requesting an appointment for a bleeding sight on right ear where skin graft done at [facility] appointment.  I called patient to tell him that he needs to go back to the [facility] where moh's done to be treated because this office does not do skin grafts.

## 2021-11-27 ENCOUNTER — Ambulatory Visit (INDEPENDENT_AMBULATORY_CARE_PROVIDER_SITE_OTHER): Payer: Medicare HMO

## 2021-11-27 VITALS — Ht 67.0 in | Wt 176.0 lb

## 2021-11-27 DIAGNOSIS — Z0000 Encounter for general adult medical examination without abnormal findings: Secondary | ICD-10-CM | POA: Diagnosis not present

## 2021-11-27 NOTE — Patient Instructions (Signed)
Timothy [name] , Thank you for taking time to come for your Medicare Wellness Visit. I appreciate your ongoing commitment to your health goals. Please review the following plan we discussed and let me know if I can assist you in the future.   Screening recommendations/referrals: Colonoscopy: No longer required due age.  Recommended yearly ophthalmology/optometry visit for glaucoma screening and checkup Recommended yearly dental visit for hygiene and checkup  Vaccinations: Influenza vaccine: Done 10/19/2021. Repeat annually  Pneumococcal vaccine: Done 06/16/2008 and 10/11/2014 Tdap vaccine: Done 09/20/2011 Repeat in 10 years  Shingles vaccine: Shingrix discussed. Please contact your pharmacy for coverage information.     Covid-19: Done 02/15/2020, 03/14/2020 and 11/08/2020  Advanced directives: Please bring a copy of your health care power of attorney and living will to the office to be added to your chart at your convenience.   Conditions/risks identified: Aim for 30 minutes of exercise or walking each day, drink 6-8 glasses of water and eat lots of fruits and vegetables.   Next appointment: Follow up in one year for your annual wellness visit. 2023.  Preventive Care 29 Years and Older, Male  Preventive care refers to lifestyle choices and visits with your health care provider that can promote health and wellness. What does preventive care include? A yearly physical exam. This is also called an annual well check. Dental exams once or twice a year. Routine eye exams. Ask your health care provider how often you should have your eyes checked. Personal lifestyle choices, including: Daily care of your teeth and gums. Regular physical activity. Eating a healthy diet. Avoiding tobacco and drug use. Limiting alcohol use. Practicing safe sex. Taking low doses of aspirin every day. Taking vitamin and mineral supplements as recommended by your health care provider. What happens during an annual  well check? The services and screenings done by your health care provider during your annual well check will depend on your age, overall health, lifestyle risk factors, and family history of disease. Counseling  Your health care provider may ask you questions about your: Alcohol use. Tobacco use. Drug use. Emotional well-being. Home and relationship well-being. Sexual activity. Eating habits. History of falls. Memory and ability to understand (cognition). Work and work Statistician. Screening  You may have the following tests or measurements: Height, weight, and BMI. Blood pressure. Lipid and cholesterol levels. These may be checked every 5 years, or more frequently if you are over 54 years old. Skin check. Lung cancer screening. You may have this screening every year starting at age 67 if you have a 30-pack-year history of smoking and currently smoke or have quit within the past 15 years. Fecal occult blood test (FOBT) of the stool. You may have this test every year starting at age 4. Flexible sigmoidoscopy or colonoscopy. You may have a sigmoidoscopy every 5 years or a colonoscopy every 10 years starting at age 28. Prostate cancer screening. Recommendations will vary depending on your family history and other risks. Hepatitis C blood test. Hepatitis B blood test. Sexually transmitted disease (STD) testing. Diabetes screening. This is done by checking your blood sugar (glucose) after you have not eaten for a while (fasting). You may have this done every 1-3 years. Abdominal aortic aneurysm (AAA) screening. You may need this if you are a current or former smoker. Osteoporosis. You may be screened starting at age 55 if you are at high risk. Talk with your health care provider about your test results, treatment options, and if necessary, the need for more  tests. Vaccines  Your health care provider may recommend certain vaccines, such as: Influenza vaccine. This is recommended every  year. Tetanus, diphtheria, and acellular pertussis (Tdap, Td) vaccine. You may need a Td booster every 10 years. Zoster vaccine. You may need this after age 76. Pneumococcal 13-valent conjugate (PCV13) vaccine. One dose is recommended after age 37. Pneumococcal polysaccharide (PPSV23) vaccine. One dose is recommended after age 27. Talk to your health care provider about which screenings and vaccines you need and how often you need them. This information is not intended to replace advice given to you by your health care provider. Make sure you discuss any questions you have with your health care provider. Document Released: 12/30/2015 Document Revised: 08/22/2016 Document Reviewed: 10/04/2015 Elsevier Interactive Patient Education  2017 [place] Prevention in the Home Falls can cause injuries. They can happen to people of all ages. There are many things you can do to make your home safe and to help prevent falls. What can I do on the outside of my home? Regularly fix the edges of walkways and driveways and fix any cracks. Remove anything that might make you trip as you walk through a door, such as a raised step or threshold. Trim any bushes or trees on the path to your home. Use bright outdoor lighting. Clear any walking paths of anything that might make someone trip, such as rocks or tools. Regularly check to see if handrails are loose or broken. Make sure that both sides of any steps have handrails. Any raised decks and porches should have guardrails on the edges. Have any leaves, snow, or ice cleared regularly. Use sand or salt on walking paths during winter. Clean up any spills in your garage right away. This includes oil or grease spills. What can I do in the bathroom? Use night lights. Install grab bars by the toilet and in the tub and shower. Do not use towel bars as grab bars. Use non-skid mats or decals in the tub or shower. If you need to sit down in the shower, use a  plastic, non-slip stool. Keep the floor dry. Clean up any water that spills on the floor as soon as it happens. Remove soap buildup in the tub or shower regularly. Attach bath mats securely with double-sided non-slip rug tape. Do not have throw rugs and other things on the floor that can make you trip. What can I do in the bedroom? Use night lights. Make sure that you have a light by your bed that is easy to reach. Do not use any sheets or blankets that are too big for your bed. They should not hang down onto the floor. Have a firm chair that has side arms. You can use this for support while you get dressed. Do not have throw rugs and other things on the floor that can make you trip. What can I do in the kitchen? Clean up any spills right away. Avoid walking on wet floors. Keep items that you use a lot in easy-to-reach places. If you need to reach something above you, use a strong step stool that has a grab bar. Keep electrical cords out of the way. Do not use floor polish or wax that makes floors slippery. If you must use wax, use non-skid floor wax. Do not have throw rugs and other things on the floor that can make you trip. What can I do with my stairs? Do not leave any items on the stairs. Make sure  that there are handrails on both sides of the stairs and use them. Fix handrails that are broken or loose. Make sure that handrails are as long as the stairways. Check any carpeting to make sure that it is firmly attached to the stairs. Fix any carpet that is loose or worn. Avoid having throw rugs at the top or bottom of the stairs. If you do have throw rugs, attach them to the floor with carpet tape. Make sure that you have a light switch at the top of the stairs and the bottom of the stairs. If you do not have them, ask someone to add them for you. What else can I do to help prevent falls? Wear shoes that: Do not have high heels. Have rubber bottoms. Are comfortable and fit you  well. Are closed at the toe. Do not wear sandals. If you use a stepladder: Make sure that it is fully opened. Do not climb a closed stepladder. Make sure that both sides of the stepladder are locked into place. Ask someone to hold it for you, if possible. Clearly mark and make sure that you can see: Any grab bars or handrails. First and last steps. Where the edge of each step is. Use tools that help you move around (mobility aids) if they are needed. These include: Canes. Walkers. Scooters. Crutches. Turn on the lights when you go into a dark area. Replace any light bulbs as soon as they burn out. Set up your furniture so you have a clear path. Avoid moving your furniture around. If any of your floors are uneven, fix them. If there are any pets around you, be aware of where they are. Review your medicines with your doctor. Some medicines can make you feel dizzy. This can increase your chance of falling. Ask your doctor what other things that you can do to help prevent falls. This information is not intended to replace advice given to you by your health care provider. Make sure you discuss any questions you have with your health care provider. Document Released: 09/29/2009 Document Revised: 05/10/2016 Document Reviewed: 01/07/2015 Elsevier Interactive Patient Education  2017 [employer].

## 2021-11-27 NOTE — Progress Notes (Signed)
Subjective:   Timothy [name] is a 80 y.o. male who presents for Medicare Annual/Subsequent preventive examination. Virtual Visit via Telephone Note  I connected with  Timothy [name] on 11/27/21 at  2:00 PM EST by telephone and verified that I am speaking with the correct person using two identifiers.  Location: Patient: Home Provider: [facility] Persons participating in the virtual visit: patient/Nurse Health Advisor   I discussed the limitations, risks, security and privacy concerns of performing an evaluation and management service by telephone and the availability of in person appointments. The patient expressed understanding and agreed to proceed.  Interactive audio and video telecommunications were attempted between this nurse and patient, however failed, due to patient having technical difficulties OR patient did not have access to video capability.  We continued and completed visit with audio only.  Some vital signs may be absent or patient reported.   Chriss Driver, LPN  Review of Systems     Cardiac Risk Factors include: advanced age (>16mn, >>35women);hypertension;diabetes mellitus;dyslipidemia;male gender;sedentary lifestyle     Objective:    Today's Vitals   11/27/21 1401  Weight: 176 lb (79.8 kg)  Height: '5\' 7"'  (1.702 m)   Body mass index is 27.57 kg/m.  Advanced Directives 11/27/2021 08/15/2018 05/21/2018 08/28/2016 08/15/2016 05/13/2015  Does Patient Have a Medical Advance Directive? Yes No No Yes Yes Yes  Type of AParamedicof ADowningLiving will - - [place] will [place] will [place] will  Does patient want to make changes to medical advance directive? - - - No - Patient declined - No - Patient declined  Copy of [place] Chart? No - copy requested - - No - copy requested No - copy requested No - copy requested  Would patient like  information on creating a medical advance directive? - No - Patient declined No - Patient declined - - -    Current Medications (verified) Outpatient Encounter Medications as of 11/27/2021  Medication Sig   amLODipine (NORVASC) 10 MG tablet TAKE 1 TABLET ONCE DAILY   aspirin 81 MG EC tablet Take 81 mg by mouth daily.   blood glucose meter kit and supplies Dispense based on patient and insurance preference. Use up to four times daily as directed. (FOR ICD-10 E11.21)   carvedilol (COREG) 12.5 MG tablet TAKE (1) TABLET TWICE A DAY WITH MEALS (BREAKFAST AND SUPPER)   Choline Fenofibrate (FENOFIBRIC ACID) 135 MG CPDR TAKE 1 CAPSULE BY MOUTH DAILY.   dapagliflozin propanediol (FARXIGA) 10 MG TABS tablet Take 1 tablet (10 mg total) by mouth daily before breakfast.   enalapril (VASOTEC) 2.5 MG tablet Take 1 tablet (2.5 mg total) by mouth daily.   esomeprazole (NEXIUM) 40 MG capsule TAKE (1) CAPSULE DAILY   fluticasone (FLONASE) 50 MCG/ACT nasal spray Place 2 sprays into both nostrils daily.   furosemide (LASIX) 20 MG tablet Take 1/2 tablet (10 mg total) by mouth daily.   glucose blood test strip UAD to test BGs up to 4 times daily E11.21   isosorbide mononitrate (IMDUR) 60 MG 24 hr tablet TAKE 1 TABLET DAILY   Lancets MISC UAD to test BGs up to 4 times daily E11.21   latanoprost (XALATAN) 0.005 % ophthalmic solution Place 1 drop into both eyes at bedtime.   nitroGLYCERIN (NITROSTAT) 0.4 MG SL tablet Place 1 tablet (0.4 mg total) under the tongue every 5 (five) minutes as needed.   rosuvastatin (CRESTOR)  20 MG tablet TAKE 1 TABLET ONCE DAILY   silodosin (RAPAFLO) 8 MG CAPS capsule Take 1 capsule (8 mg total) by mouth daily with breakfast.   VASCEPA 1 g capsule Take 2 capsules (2 g total) by mouth 2 (two) times daily.   Vitamin D, Ergocalciferol, (DRISDOL) 1.25 MG (50000 UNIT) CAPS capsule Take 1 capsule (50,000 Units total) by mouth every 7 (seven) days.   No facility-administered encounter  medications on file as of 11/27/2021.    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   RIGHT NASAL SIDEWALL- NO TX   BCC (basal cell carcinoma) 11/15/2004   LEFT CHEST- TX CURET X 3, 5FU   BCC (basal cell carcinoma) 11/15/2004   RIGHT EAR- TX CURET X 3, EXCISION   BCC (basal cell carcinoma) 08/13/2005   RIGHT EAR- TX MOHS   BCC (basal cell carcinoma) 04/01/2008   SUPERFICIAL- UPPER LEFT BACK- TX CURET X 3, 5FU   BCC (basal cell carcinoma) 12/23/2012   ULCERATED- ABOVE RIGHT LIP- TX MOHS   Cataract    Chronic kidney disease    Colon polyp    Coronary artery disease    (left main normal, LAD 25-30% stenosis, distal 30-40% stenosis, circumflex obtuse marginal 50% stenosis,  right coronary artery dominant with long 75% followed by mid 80%  stenosis followed by 90% stenosis at the ostium of the PDA.  He had  stenting of the PDA by Dr. [name].  This was a Cypher stent.   First angioplasty was 1994, Cath 2011 100% RCA ).       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/27/2017   WELL DIFF- RIGHT TEMPLE-TX CURET X3,5FU   SCC (squamous cell carcinoma) 10/14/2019   WELL DIFF- RIGHT SHOULDER-TX CURET X 3, 5FU   SCC (squamous cell carcinoma) 01/24/2019   WELL DIFF- LEFT INNER CHEEK- MOHS   SCCA (squamous cell carcinoma) of skin  03/08/2005   ABOVE LEFT OUTER BROW SUPERIOR- TX CURET X 3, 5FU   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   LEFT FOREHEAD-TX CURET X 3, 5FU   SCCA (squamous cell carcinoma) of skin 04/01/2008   RIGHT HAND- TX CURET X 3, 5FU   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   LEFT SIDE OF FACE- TX CURET AFTER BIOPSY   Sleep apnea    no c-pap   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   in situ- dorsum of nose (CX35FU)   Past Surgical History:  Procedure Laterality Date   APPENDECTOMY     CERVICAL SPINE SURGERY     CHOLECYSTECTOMY     COLONOSCOPY  2004   multiple since   CORONARY ANGIOPLASTY WITH STENT PLACEMENT     3 sents   EYE SURGERY Bilateral    cataracts  HERNIA REPAIR Bilateral    SKIN CANCER EXCISION  03/17/2017   SKIN CANCER EXCISION  03/17/2020   left side of face   Family History  Problem Relation Age of Onset   Cancer Mother        unsure of type   Glaucoma Mother    Heart disease Father    Coronary artery disease Father    Cancer Sister 30       granlocytosis (Wergerner's)   Colon cancer Brother 63   Nephritis Sister        20's   Cancer Brother        lung    Social History   Socioeconomic History   Marital status: Married    Spouse name: Charlene   Number of children: 2   Years of education: Not on file   Highest education level: Not on file  Occupational History   Occupation: retired  Tobacco Use   Smoking status: Former    Packs/day: 2.00    Years: 25.00    Pack years: 50.00    Types: Cigarettes    Quit date: 08/14/1991    Years since quitting: 30.3   Smokeless tobacco: Never  Vaping Use   Vaping Use: Never used  Substance and Sexual Activity   Alcohol use: Yes    Comment: rare   Drug use: No   Sexual activity: Yes  Other Topics Concern   Not on file  Social History Narrative   Married x 60 years in 2022.   7 grandchildren.   1 great granddaughter.    Social Determinants of Health   Financial Resource Strain:  Low Risk    Difficulty of Paying Living Expenses: Not hard at all  Food Insecurity: No Food Insecurity   Worried About Charity fundraiser in the Last Year: Never true   [place] in the Last Year: Never true  Transportation Needs: No Transportation Needs   Lack of Transportation (Medical): No   Lack of Transportation (Non-Medical): No  Physical Activity: Insufficiently Active   Days of Exercise per Week: 3 days   Minutes of Exercise per Session: 20 min  Stress: No Stress Concern Present   Feeling of Stress : Not at all  Social Connections: Socially Integrated   Frequency of Communication with Friends and Family: More than three times a week   Frequency of Social Gatherings with Friends and Family: More than three times a week   Attends Religious Services: More than 4 times per year   Active Member of [employer] or Organizations: Yes   Attends Music therapist: More than 4 times per year   Marital Status: Married    Tobacco Counseling Counseling given: Not Answered   Clinical Intake:  Pre-visit preparation completed: Yes  Pain : No/denies pain     BMI - recorded: 27.57 Nutritional Status: BMI 25 -29 Overweight Nutritional Risks: None Diabetes: Yes     Diabetic?Nutrition Risk Assessment:  Has the patient had any N/V/D within the last 2 months?  No  Does the patient have any non-healing wounds?  Yes  Skin Ca to ear per pt.  Has the patient had any unintentional weight loss or weight gain?  No   Diabetes:  Is the patient diabetic?  No  If diabetic, was a CBG obtained today?  No  Did the patient bring in their glucometer from home?  No  Phone visit.  How often do you monitor your CBG's? Daily.  Financial Strains and Diabetes Management:  Are you having any financial strains with the device, your supplies or your medication? No .  Does the patient want to be seen by Chronic Care Management for management of their diabetes?  No  Would the patient  like to be referred to a Nutritionist or for Diabetic Management?  No   Diabetic Exams:  Diabetic Eye Exam: Completed 2022. Pt has been advised about the importance in completing this exam.   Diabetic Foot Exam: Completed 03/23/2020. Pt has been advised about the importance in completing this exam.   Interpreter Needed?: No  Information entered by :: MJ Zakyah Yanes, LPN   Activities of Daily Living In your present state of health, do you have any difficulty performing the following activities: 11/27/2021  Hearing? N  Vision? N  Difficulty concentrating or making decisions? Y  Walking or climbing stairs? N  Dressing or bathing? N  Doing errands, shopping? N  Preparing Food and eating ? N  Using the Toilet? N  In the past six months, have you accidently leaked urine? Y  Comment Sees Urologist.  Do you have problems with loss of bowel control? N  Managing your Medications? N  Managing your Finances? N  Housekeeping or managing your Housekeeping? N  Some recent data might be hidden    Patient Care Team: Janora Norlander, DO as PCP - General (Family Medicine) Minus Breeding, MD as PCP - Cardiology (Cardiology) Clent Jacks, MD as Consulting Physician (Ophthalmology) Minus Breeding, MD as Consulting Physician (Cardiology) Chesley Mires, MD as Consulting Physician (Pulmonary Disease) Lavonna Monarch, MD as Consulting Physician (Dermatology) Lavera Guise, [facility] as Pharmacist (Family Medicine)  Indicate any recent Medical Services you may have received from other than [facility] providers in the past year (date may be approximate).     Assessment:   This is a routine wellness examination for Timothy [name].  Hearing/Vision screen Hearing Screening - Comments:: Some hearing issues.  Vision Screening - Comments:: Glasses to drive. Dr. Katy Fitch. 2022.  Dietary issues and exercise activities discussed: Current Exercise Habits: Home exercise routine, Type of exercise: walking, Time (Minutes): 20,  Frequency (Times/Week): 3, Weekly Exercise (Minutes/Week): 60, Intensity: Mild, Exercise limited by: cardiac condition(s)   Goals Addressed             This Visit's Progress    Eat more fruits and vegetables   On track    Limit intake of high carbohydrate containing foods.   Increase non-starchy vegetables - carrots, green bean, squash, zucchini, tomatoes, onions, peppers, spinach and other green leafy vegetables, cabbage, lettuce, cucumbers, asparagus, okra (not fried), eggplant limit sugar and processed foods (cakes, cookies, ice cream, crackers and chips) Increase fresh fruit but limit serving sizes 1/2 cup or about the size of tennis or baseball limit red meat to no more than 1-2 times per week (serving size about the size of your palm) Choose whole grains / lean proteins - whole wheat bread, quinoa, whole grain rice (1/2 cup), fish, chicken, [place]      Exercise 3x per week (30 min per time)         Depression Screen PHQ 2/9 Scores 11/27/2021 08/04/2021 04/13/2021 03/16/2021 06/27/2020 03/23/2020 05/26/2019  PHQ - 2 Score 0 0 0 0 0 0 0  PHQ- 9 Score - - - - 0 - 0    Fall Risk Fall Risk  11/27/2021 08/04/2021 04/13/2021 03/16/2021 06/27/2020  Falls in the past year? 0 0 0 0 0  Comment - - - - -  Number falls in past yr: 0 - - - -  Injury with Fall? 0 - - - -  Comment - - - - -  Risk for fall due to : Impaired balance/gait - - - -  Follow up Falls prevention discussed - - - -    FALL RISK PREVENTION PERTAINING TO THE HOME:  Any stairs in or around the home? Yes  If so, are there any without handrails? No  Home free of loose throw rugs in walkways, pet beds, electrical cords, etc? Yes  Adequate lighting in your home to reduce risk of falls? Yes   ASSISTIVE DEVICES UTILIZED TO PREVENT FALLS:  Life alert? No  Use of a cane, walker or w/c? No  Grab bars in the bathroom? Yes  Shower chair or bench in shower? No  Elevated toilet seat or a handicapped toilet? No   TIMED UP AND  GO:  Was the test performed? No .  Phone visit.  Cognitive Function: MMSE - Mini Mental State Exam 08/15/2016 05/13/2015 05/13/2015 05/13/2015  Orientation to time '5 5 5 5  ' Orientation to Place '5 5 5 ' -  Registration '3 3 3 ' -  Attention/ Calculation '5 5 5 ' -  Recall '3 2 2 ' -  Language- name 2 objects 2 2 - -  Language- repeat '1 1 1 ' -  Language- follow 3 step command '3 3 3 ' -  Language- read & follow direction '1 1 1 ' -  Write a sentence '1 1 1 ' -  Copy design '1 1 1 ' -  Total score 30 29 - -     6CIT Screen 11/27/2021  What Year? 0 points  What month? 0 points  What time? 0 points  Count back from 20 0 points  Months in reverse 0 points  Repeat phrase 0 points  Total Score 0    Immunizations Immunization History  Administered Date(s) Administered   Fluad Quad(high Dose 65+) 10/18/2020, 10/19/2021   Influenza Whole 08/17/2010   Influenza, High Dose Seasonal PF 09/21/2016, 09/25/2017, 09/24/2018   Influenza,inj,Quad PF,6+ Mos 09/28/2013, 10/11/2014, 10/10/2015, 11/06/2019   Moderna Sars-Covid-2 Vaccination 02/15/2020, 03/14/2020, 11/08/2020   Pneumococcal Conjugate-13 10/11/2014   Pneumococcal Polysaccharide-23 06/16/2008   Td 02/15/2003, 09/20/2011   Tdap 09/17/2011   Zoster, Live 05/18/2007    TDAP status: Due, Education has been provided regarding the importance of this vaccine. Advised may receive this vaccine at local pharmacy or Health Dept. Aware to provide a copy of the vaccination record if obtained from local pharmacy or Health Dept. Verbalized acceptance and understanding.  Flu Vaccine status: Up to date  Pneumococcal vaccine status: Up to date  Covid-19 vaccine status: Completed vaccines  Qualifies for Shingles Vaccine? Yes   Zostavax completed Yes   Shingrix Completed?: No.    Education has been provided regarding the importance of this vaccine. Patient has been advised to call insurance company to determine out of pocket expense if they have not yet received this  vaccine. Advised may also receive vaccine at local pharmacy or Health Dept. Verbalized acceptance and understanding.  Screening Tests Health Maintenance  Topic Date Due   Zoster Vaccines- Shingrix (1 of 2) Never done   COVID-19 Vaccine (4 - Booster for Moderna series) 01/03/2021   FOOT EXAM  03/23/2021   TETANUS/TDAP  09/19/2021   HEMOGLOBIN A1C  02/04/2022   OPHTHALMOLOGY EXAM  05/31/2022   Pneumonia Vaccine 16+ Years old  Completed   INFLUENZA VACCINE  Completed   HPV VACCINES  Aged Out  Health Maintenance  Health Maintenance Due  Topic Date Due   Zoster Vaccines- Shingrix (1 of 2) Never done   COVID-19 Vaccine (4 - Booster for Moderna series) 01/03/2021   FOOT EXAM  03/23/2021   TETANUS/TDAP  09/19/2021    Colorectal cancer screening: No longer required.   Lung Cancer Screening: (Low Dose CT Chest recommended if Age 44-80 years, 30 pack-year currently smoking OR have quit w/in 15years.) does not qualify.  Quit smoking in 1992.  Additional Screening:  Hepatitis C Screening: does not qualify;  Vision Screening: Recommended annual ophthalmology exams for early detection of glaucoma and other disorders of the eye. Is the patient up to date with their annual eye exam?  Yes  Who is the provider or what is the name of the office in which the patient attends annual eye exams? Dr. Katy Fitch If pt is not established with a provider, would they like to be referred to a provider to establish care? No .   Dental Screening: Recommended annual dental exams for proper oral hygiene  Community Resource Referral / Chronic Care Management: CRR required this visit?  No   CCM required this visit?  No      Plan:     I have personally reviewed and noted the following in the patient's chart:   Medical and social history Use of alcohol, tobacco or illicit drugs  Current medications and supplements including opioid prescriptions. Patient is not currently taking opioid  prescriptions. Functional ability and status Nutritional status Physical activity Advanced directives List of other physicians Hospitalizations, surgeries, and ER visits in previous 12 months Vitals Screenings to include cognitive, depression, and falls Referrals and appointments  In addition, I have reviewed and discussed with patient certain preventive protocols, quality metrics, and best practice recommendations. A written personalized care plan for preventive services as well as general preventive health recommendations were provided to patient.     Chriss Driver, LPN   [id number]   Nurse Notes: Pt up to date on vaccines. Discussed Shingrix and Tdap and how to obtain. Colonoscopy  no longer required. Overdue for diabetic foot exam. 6CIT score of 0.

## 2021-12-04 ENCOUNTER — Other Ambulatory Visit: Payer: Self-pay

## 2021-12-04 ENCOUNTER — Encounter: Payer: Self-pay | Admitting: Physician Assistant

## 2021-12-04 ENCOUNTER — Ambulatory Visit: Payer: Medicare HMO | Admitting: Physician Assistant

## 2021-12-04 DIAGNOSIS — D0439 Carcinoma in situ of skin of other parts of face: Secondary | ICD-10-CM

## 2021-12-04 DIAGNOSIS — Z85828 Personal history of other malignant neoplasm of skin: Secondary | ICD-10-CM

## 2021-12-04 DIAGNOSIS — D0430 Carcinoma in situ of skin of unspecified part of face: Secondary | ICD-10-CM

## 2021-12-04 DIAGNOSIS — L821 Other seborrheic keratosis: Secondary | ICD-10-CM

## 2021-12-04 DIAGNOSIS — C44329 Squamous cell carcinoma of skin of other parts of face: Secondary | ICD-10-CM

## 2021-12-04 DIAGNOSIS — C44320 Squamous cell carcinoma of skin of unspecified parts of face: Secondary | ICD-10-CM

## 2021-12-04 DIAGNOSIS — D485 Neoplasm of uncertain behavior of skin: Secondary | ICD-10-CM

## 2021-12-04 DIAGNOSIS — Z85820 Personal history of malignant melanoma of skin: Secondary | ICD-10-CM

## 2021-12-04 NOTE — Patient Instructions (Signed)

## 2021-12-04 NOTE — Progress Notes (Signed)
Follow-Up Visit   Subjective  Timothy [name] is a 80 y.o. male who presents for the following: Follow-up (Patient has history of mm,scc and bcc.  He has a pink scale on the right side of the face x months ). He is scheduled for mohs on his right ear.    The following portions of the chart were reviewed this encounter and updated as appropriate:  Tobacco   Allergies   Meds   Problems   Med Hx   Surg Hx   Fam Hx       Objective  Well appearing patient in no apparent distress; mood and affect are within normal limits.  All skin waist up examined.  chest Brown crust on erythematous base.      Left Eyebrow Hyperkeratotic scale with pink base      Right Zygomatic Area Large plaque with yellow discoloration.     Right Forehead Hyperkeratotic scale with pink base       Assessment & Plan  Neoplasm of uncertain behavior of skin chest  Skin / nail biopsy Type of biopsy: tangential   Informed consent: discussed and consent obtained   Timeout: patient name, date of birth, surgical site, and procedure verified   Anesthesia: the lesion was anesthetized in a standard fashion   Anesthetic:  1% lidocaine w/ epinephrine 1-100,000 local infiltration Instrument used: flexible razor blade   Hemostasis achieved with: aluminum chloride and electrodesiccation   Outcome: patient tolerated procedure well   Post-procedure details: wound care instructions given    Specimen 4 - Surgical pathology Differential Diagnosis: bcc scc   Check Margins: No  Carcinoma in situ of skin of face, unspecified location Left Eyebrow  Skin / nail biopsy Type of biopsy: tangential   Informed consent: discussed and consent obtained   Timeout: patient name, date of birth, surgical site, and procedure verified   Anesthesia: the lesion was anesthetized in a standard fashion   Anesthetic:  1% lidocaine w/ epinephrine 1-100,000 local infiltration Instrument used: flexible razor blade   Hemostasis  achieved with: aluminum chloride and electrodesiccation   Outcome: patient tolerated procedure well   Post-procedure details: wound care instructions given    Destruction of lesion Complexity: simple   Destruction method: electrodesiccation and curettage   Informed consent: discussed and consent obtained   Timeout:  patient name, date of birth, surgical site, and procedure verified Anesthesia: the lesion was anesthetized in a standard fashion   Anesthetic:  1% lidocaine w/ epinephrine 1-100,000 local infiltration Curettage performed in three different directions: Yes   Electrodesiccation performed over the curetted area: Yes   Curettage cycles:  3 Margin per side (cm):  0.1 Final wound size (cm):  2 Hemostasis achieved with:  aluminum chloride Outcome: patient tolerated procedure well with no complications   Post-procedure details: wound care instructions given    Specimen 1 - Surgical pathology Differential Diagnosis: bcc scc tx with bx  Check Margins: No  SCC (squamous cell carcinoma), face (2) Right Zygomatic Area  Skin / nail biopsy Type of biopsy: tangential   Informed consent: discussed and consent obtained   Timeout: patient name, date of birth, surgical site, and procedure verified   Procedure prep:  Patient was prepped and draped in usual sterile fashion (Non sterile) Prep type:  Chlorhexidine Anesthesia: the lesion was anesthetized in a standard fashion   Anesthetic:  1% lidocaine w/ epinephrine 1-100,000 local infiltration Instrument used: flexible razor blade   Outcome: patient tolerated procedure well   Post-procedure details:  wound care instructions given    Destruction of lesion Complexity: simple   Destruction method: electrodesiccation and curettage   Informed consent: discussed and consent obtained   Timeout:  patient name, date of birth, surgical site, and procedure verified Anesthesia: the lesion was anesthetized in a standard fashion   Anesthetic:   1% lidocaine w/ epinephrine 1-100,000 local infiltration Curettage performed in three different directions: Yes   Electrodesiccation performed over the curetted area: Yes   Curettage cycles:  3 Margin per side (cm):  0.1 Final wound size (cm):  2 Hemostasis achieved with:  aluminum chloride Outcome: patient tolerated procedure well with no complications   Post-procedure details: wound care instructions given    Specimen 2 - Surgical pathology Differential Diagnosis: bcc scc tx with bx  Check Margins: No  Right Forehead  Skin / nail biopsy Type of biopsy: tangential   Informed consent: discussed and consent obtained   Timeout: patient name, date of birth, surgical site, and procedure verified   Anesthesia: the lesion was anesthetized in a standard fashion   Anesthetic:  1% lidocaine w/ epinephrine 1-100,000 local infiltration Instrument used: flexible razor blade   Hemostasis achieved with: aluminum chloride and electrodesiccation   Outcome: patient tolerated procedure well   Post-procedure details: wound care instructions given    Destruction of lesion Complexity: simple   Destruction method: electrodesiccation and curettage   Informed consent: discussed and consent obtained   Timeout:  patient name, date of birth, surgical site, and procedure verified Anesthesia: the lesion was anesthetized in a standard fashion   Anesthetic:  1% lidocaine w/ epinephrine 1-100,000 local infiltration Curettage performed in three different directions: Yes   Curettage cycles:  3 Margin per side (cm):  0.1 Final wound size (cm):  1.5 Hemostasis achieved with:  aluminum chloride Outcome: patient tolerated procedure well with no complications   Post-procedure details: wound care instructions given    Specimen 3 - Surgical pathology Differential Diagnosis: bcc scc tx with bx  Check Margins: No    I, Ricardo Schubach, PA-C, have reviewed all documentation's for this visit.  The documentation  on 12/25/21 for the exam, diagnosis, procedures and orders are all accurate and complete.

## 2021-12-12 ENCOUNTER — Telehealth: Payer: Self-pay

## 2021-12-12 NOTE — Telephone Encounter (Signed)
-----   Message from Lavonna Monarch, MD sent at 12/07/2021  6:02 PM EST ----- Schedule a surgery with Dr. Darene Lamer but the right back cheek bone lesion may require Mohs

## 2021-12-12 NOTE — Telephone Encounter (Signed)
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 He also has a visit with [place] in feb/march

## 2021-12-12 NOTE — Telephone Encounter (Signed)
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 He also has a visit with [place] in feb/march.

## 2021-12-15 ENCOUNTER — Telehealth: Payer: Medicare HMO

## 2021-12-17 HISTORY — PX: MOHS SURGERY: SUR867

## 2022-01-09 ENCOUNTER — Other Ambulatory Visit: Payer: Self-pay | Admitting: Family Medicine

## 2022-01-09 DIAGNOSIS — E1159 Type 2 diabetes mellitus with other circulatory complications: Secondary | ICD-10-CM

## 2022-01-17 ENCOUNTER — Other Ambulatory Visit: Payer: Self-pay | Admitting: *Deleted

## 2022-01-17 DIAGNOSIS — E785 Hyperlipidemia, unspecified: Secondary | ICD-10-CM

## 2022-01-22 ENCOUNTER — Other Ambulatory Visit: Payer: Self-pay | Admitting: Family Medicine

## 2022-01-22 DIAGNOSIS — E1169 Type 2 diabetes mellitus with other specified complication: Secondary | ICD-10-CM

## 2022-01-22 DIAGNOSIS — E1159 Type 2 diabetes mellitus with other circulatory complications: Secondary | ICD-10-CM

## 2022-01-22 DIAGNOSIS — E785 Hyperlipidemia, unspecified: Secondary | ICD-10-CM

## 2022-01-24 ENCOUNTER — Ambulatory Visit (INDEPENDENT_AMBULATORY_CARE_PROVIDER_SITE_OTHER): Payer: Medicare HMO | Admitting: Pharmacist

## 2022-01-24 ENCOUNTER — Telehealth: Payer: Self-pay | Admitting: Pharmacist

## 2022-01-24 DIAGNOSIS — E1121 Type 2 diabetes mellitus with diabetic nephropathy: Secondary | ICD-10-CM

## 2022-01-24 DIAGNOSIS — N1831 Chronic kidney disease, stage 3a: Secondary | ICD-10-CM

## 2022-01-24 NOTE — Telephone Encounter (Signed)
Patient aware and verbalized understanding. °

## 2022-01-24 NOTE — Patient Instructions (Signed)
Visit Information  Following are the goals we discussed today:  Current Barriers:  Unable to independently afford treatment regimen Unable to achieve control of T2DM  Suboptimal therapeutic regimen for T2DM  Pharmacist Clinical Goal(s):  Over the next 90 days, patient will verbalize ability to afford treatment regimen achieve control of T2DM as evidenced by IMPROVED GLYCEMIC CONTROL through collaboration with PharmD and provider.    Interventions: 1:1 collaboration with Janora Norlander, DO regarding development and update of comprehensive plan of care as evidenced by provider attestation and co-signature Inter-disciplinary care team collaboration (see longitudinal plan of care) Comprehensive medication review performed; medication list updated in electronic medical record  Diabetes: Uncontrolled-A1C 8.2%, GFR 53; current treatment: Farxiga 10mg -->Qtern 10/5mg ;  Previous T2DM treatments-->Glyxambi 10/5 (stopped due to cost)  Patient's fasting blood sugars are starting to increase 180-230 Will combo farxiga with DPP4 to attempt to gain control --> START QTERN 10/5mg  (1 tablet every morning) --SGLT2/DPP4 combo: Samples (30-day supply) given to patient; will need to apply for patient assistance if working May need additional therapy/change, but this will be an easy transition as far as compliance and patient/medication assistance  Current glucose readings: fasting glucose: 180-230, post prandial glucose: n/a Denies hypoglycemic/hyperglycemic symptoms Discussed meal planning options and Plate method for healthy eating Avoid sugary drinks and desserts Incorporate balanced protein, non starchy veggies, 1 serving of carbohydrate with each meal Increase water intake Increase physical activity as able Current exercise: n/a Recommended transition from [place] to qtern   Patient Goals/Self-Care Activities Over the next 90 days, patient will:  - take medications as prescribed check  glucose daily (fasting), document, and provide at future appointments collaborate with provider on medication access solutions  Follow Up Plan: Telephone follow up appointment with care management team member scheduled for: 2 weeks   Plan: Telephone follow up appointment with care management team member scheduled for:  3-6 months  Signature Regina Eck, PharmD, BCPS Clinical Pharmacist, [place]  II Phone [phone]   Please call the care guide team at [phone] if you need to cancel or reschedule your appointment.   Patient verbalizes understanding of instructions and care plan provided today and agrees to view in [place]. Active MyChart status confirmed with patient.

## 2022-01-24 NOTE — Telephone Encounter (Signed)
Please call patient and let him know his Qtern samples are up front I discussed this with him today #30 day supply.  I will call him in 2 weeks to f/u  Thank you!

## 2022-01-24 NOTE — Progress Notes (Signed)
Chronic Care Management Pharmacy Note  01/24/2022 Name:  Timothy [name] MRN:  [id number] DOB:  January 31, 1941  Summary: T2DM  Recommendations/Changes made from today's visit:  Diabetes: Uncontrolled-A1C 8.2%, GFR 53; current treatment: Farxiga 64m-->Qtern 10/561m  Previous T2DM treatments-->Glyxambi 10/5 (stopped due to cost)  Patient's fasting blood sugars are starting to increase 180-230 Will combo farxiga with DPP4 to attempt to gain control --> START QTERN 10/25m62m1 tablet every morning) --SGLT2/DPP4 combo: Samples (30-day supply) given to patient; will need to apply for patient assistance if working May need additional therapy/change, but this will be an easy transition as far as compliance and patient/medication assistance  Subjective: Timothy [name] an [age] year old male who is a primary patient of Timothy [name].  The CCM team was consulted for assistance with disease management and care coordination needs.    Engaged with patient by telephone for follow up visit in response to provider referral for pharmacy case management and/or care coordination services.   Consent to Services:  The patient was given information about Chronic Care Management services, agreed to services, and gave verbal consent prior to initiation of services.  Please see initial visit note for detailed documentation.   Patient Care Team: Timothy [name] as PCP - General (Family Medicine) Timothy [name] as PCP - Cardiology (Cardiology) Timothy [name] as Consulting Physician (Ophthalmology) Timothy [name] as Consulting Physician (Cardiology) Timothy [name] as Consulting Physician (Pulmonary Disease) Timothy [name] as Consulting Physician (Dermatology) [name] Pharmacist (Family Medicine)  Objective:  Lab Results  Component Value Date   CREATININE 1.35 (H) 08/04/2021   CREATININE 1.51 (H) 04/13/2021   CREATININE 1.49 (H) 06/27/2020    Lab  Results  Component Value Date   HGBA1C 8.2 (H) 08/04/2021   Last diabetic Eye exam:  Lab Results  Component Value Date/Time   HMDIABEYEEXA No Retinopathy 05/31/2021 04:11 PM    Last diabetic Foot exam:  Lab Results  Component Value Date/Time   HMDIABFOOTEX done 01/03/2011 12:00 AM        Component Value Date/Time   CHOL 133 01/31/2021 0823   TRIG 398 (H) 01/31/2021 0823   TRIG 538 (H) 09/22/2016 0806   HDL 21 (L) 01/31/2021 0823   HDL 15 (L) 09/22/2016 0806   CHOLHDL 6.3 (H) 01/31/2021 0823   CHOLHDL 4.9 04/08/2013 0827   VLDL 55 (H) 04/08/2013 0827   LDLCALC 52 01/31/2021 0823   LDLCALC 48 07/16/2014 0818   LDLDIRECT 88 03/01/2020 0000    Hepatic Function Latest Ref Rng & Units 08/04/2021 04/13/2021 06/27/2020  Total Protein 6.0 - 8.5 g/dL - - 6.8  Albumin 3.7 - 4.7 g/dL 4.2 4.2 4.1  AST 0 - 40 IU/L - - 21  ALT 0 - 44 IU/L - - 17  Alk Phosphatase 48 - 121 IU/L - - 45(L)  Total Bilirubin 0.0 - 1.2 mg/dL - - 0.5  Bilirubin, Direct 0.00 - 0.40 mg/dL - - -    Lab Results  Component Value Date/Time   TSH 3.430 06/27/2020 08:14 AM   TSH 3.030 03/23/2020 12:29 PM    CBC Latest Ref Rng & Units 04/27/2021 04/13/2021 06/27/2020  WBC 3.4 - 10.8 x10E3/uL 5.5 5.4 5.9  Hemoglobin 13.0 - 17.7 g/dL 13.2 12.1(L) 12.7(L)  Hematocrit 37.5 - 51.0 % 38.9 35.7(L) 36.1(L)  Platelets 150 - 450 x10E3/uL 168 142(L) 172    Lab Results  Component Value Date/Time   VD25OH 15.4 (  L) 04/13/2021 02:43 PM   VD25OH 15.2 (L) 04/22/2018 09:52 AM    Clinical ASCVD: No  The ASCVD Risk score (Arnett DK, et al., 2019) failed to calculate for the following reasons:   The 2019 ASCVD risk score is only valid for ages 21 to 78    Other: (CHADS2VASc if Afib, PHQ9 if depression, MMRC or CAT for COPD, ACT, DEXA)  Social History   Tobacco Use  Smoking Status Former   Packs/day: 2.00   Years: 25.00   Pack years: 50.00   Types: Cigarettes   Quit date: 08/14/1991   Years since quitting: 30.4   Smokeless Tobacco Never   BP Readings from Last 3 Encounters:  08/31/21 114/64  08/04/21 139/66  04/13/21 123/60   Pulse Readings from Last 3 Encounters:  08/31/21 79  08/04/21 70  04/13/21 71   Wt Readings from Last 3 Encounters:  11/27/21 176 lb (79.8 kg)  08/04/21 180 lb (81.6 kg)  04/13/21 193 lb 12.8 oz (87.9 kg)    Assessment: Review of patient past medical history, allergies, medications, health status, including review of consultants reports, laboratory and other test data, was performed as part of comprehensive evaluation and provision of chronic care management services.   SDOH:  (Social Determinants of Health) assessments and interventions performed:    CCM Care Plan  Allergies  Allergen Reactions   Hydrocodone-Acetaminophen Other (See Comments)    Makes pt. Feel loopy    Medications Reviewed Today     Reviewed by Lavera Guise, [facility] (Pharmacist) on 01/24/22 at 58  Med List Status: <None>   Medication Order Taking? Sig Documenting Provider Last Dose Status Informant  amLODipine (NORVASC) 10 MG tablet [id number]  Take 1 tablet (10 mg total) by mouth daily. (NEEDS TO BE SEEN BEFORE NEXT REFILL) Janora Norlander, DO  Active   aspirin 81 MG EC tablet [id number] No Take 81 mg by mouth daily. [provider] Taking Active Self  blood glucose meter kit and supplies [id number] No Dispense based on patient and insurance preference. Use up to four times daily as directed. (FOR ICD-10 E11.21) [name], DO Taking Active   carvedilol (COREG) 12.5 MG tablet [id number]  TAKE (1) TABLET TWICE A DAY WITH MEALS (BREAKFAST AND SUPPER) [name], DO  Active   Choline Fenofibrate (FENOFIBRIC ACID) 135 MG CPDR [id number]  Take 135 mg by mouth daily. (NEEDS TO BE SEEN BEFORE NEXT REFILL) [name], DO  Active   dapagliflozin propanediol (FARXIGA) 10 MG TABS tablet [id number] No Take 1 tablet (10 mg total) by mouth daily before breakfast.  Janora Norlander, DO Taking Active            Med Note Blanca Friend, Trystan Eads D   Wed Jan 24, 2022  2:26 PM) Via [place] patient assistance [place]   enalapril (VASOTEC) 2.5 MG tablet [id number] No Take 1 tablet (2.5 mg total) by mouth daily. [name], DO Taking Active   esomeprazole (NEXIUM) 40 MG capsule [id number]  Take 1 capsule (40 mg total) by mouth daily. (NEEDS TO BE SEEN BEFORE NEXT REFILL) [name], DO  Active   fluticasone (FLONASE) 50 MCG/ACT nasal spray [id number] No Place 2 sprays into both nostrils daily. Sharion Balloon, FNP Taking Active   furosemide (LASIX) 20 MG tablet [id number] No Take 1/2 tablet (10 mg total) by mouth daily. [name], DO Taking Active   glucose blood test strip [id number] No UAD to test BGs up to  4 times daily E11.21 [name], DO Taking Active   isosorbide mononitrate (IMDUR) 60 MG 24 hr tablet [id number] No TAKE 1 TABLET DAILY Pixie Casino, MD Taking Active   Lancets MISC [id number] No UAD to test BGs up to 4 times daily E11.21 [name], DO Taking Active   latanoprost (XALATAN) 0.005 % ophthalmic solution [id number] No Place 1 drop into both eyes at bedtime. [provider] Taking Active Self  nitroGLYCERIN (NITROSTAT) 0.4 MG SL tablet [id number] No Place 1 tablet (0.4 mg total) under the tongue every 5 (five) minutes as needed. Timothy Breeding, MD Taking Active   rosuvastatin (CRESTOR) 20 MG tablet [id number] No TAKE 1 TABLET ONCE DAILY Hilty, Nadean Corwin, MD Taking Active   silodosin (RAPAFLO) 8 MG CAPS capsule [id number] No Take 1 capsule (8 mg total) by mouth daily with breakfast. Irine Seal, MD Taking Active   VASCEPA 1 g capsule [id number] No Take 2 capsules (2 g total) by mouth 2 (two) times daily. [name], DO Taking Active   Vitamin D, Ergocalciferol, (DRISDOL) 1.25 MG (50000 UNIT) CAPS capsule [id number] No Take 1 capsule (50,000 Units total) by mouth every 7 (seven) days. Janora Norlander,  DO Taking Active             Patient Active Problem List   Diagnosis Date Noted   Sleep apnea 04/05/2020   Stage 3a chronic kidney disease ([place]) 04/05/2020   Hypertension associated with type 2 diabetes mellitus ([place]) 05/26/2019   Gastroesophageal reflux disease without esophagitis 05/26/2019   Coronary artery disease involving native coronary artery of native heart without angina pectoris 04/15/2019   Long-term use of high-risk medication 04/15/2019   Educated about COVID-19 virus infection 04/15/2019   Hyperlipidemia associated with type 2 diabetes mellitus ([place]) 10/09/2018   Enteritis 08/15/2018   AKI (acute kidney injury) ([place]) 08/15/2018   Thrombocytopenia ([place]) 09/23/2016   Elevated triglycerides with high cholesterol 10/18/2015   CKD stage 3 due to type 2 diabetes mellitus ([place]) 05/13/2015   Glaucoma 12/27/2014   Overweight(278.02) 06/27/2011   ASCVD (arteriosclerotic cardiovascular disease)    Benign prostatic hyperplasia    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    Type 2 diabetes mellitus with diabetic nephropathy ([place]) 05/24/2010   Hyperlipidemia LDL goal <70 05/13/2009   Coronary atherosclerosis 05/13/2009    Immunization History  Administered Date(s) Administered   Fluad Quad(high Dose 65+) 10/18/2020, 10/19/2021   Influenza Whole 08/17/2010   Influenza, High Dose Seasonal PF 09/21/2016, 09/25/2017, 09/24/2018   Influenza,inj,Quad PF,6+ Mos 09/28/2013, 10/11/2014, 10/10/2015, 11/06/2019   Moderna Sars-Covid-2 Vaccination 02/15/2020, 03/14/2020, 11/08/2020   Pneumococcal Conjugate-13 10/11/2014   Pneumococcal Polysaccharide-23 06/16/2008   Td 02/15/2003, 09/20/2011   Tdap 09/17/2011   Zoster, Live 05/18/2007    Conditions to be addressed/monitored: DMII  Care Plan : PHARMD MEDICATION MANAGEMENT  Updates made by Lavera Guise, RPH since 01/24/2022 12:00 AM     Problem: DISEASE PROGRESSION PREVENTION      Long-Range Goal: T2DM    Recent Progress: Not on track  Priority: High  Note:   Current Barriers:  Unable to independently afford treatment regimen Unable to achieve control of T2DM  Suboptimal therapeutic regimen for T2DM  Pharmacist Clinical Goal(s):  Over the next 90 days, patient will verbalize ability to afford treatment regimen achieve control of T2DM as evidenced by IMPROVED GLYCEMIC CONTROL  through collaboration with PharmD and provider.    Interventions: 1:1 collaboration with Janora Norlander,  DO regarding development and update of comprehensive plan of care as evidenced by provider attestation and co-signature Inter-disciplinary care team collaboration (see longitudinal plan of care) Comprehensive medication review performed; medication list updated in electronic medical record  Diabetes: Uncontrolled-A1C 8.2%, GFR 53; current treatment: Farxiga 17m-->Qtern 10/530m  Previous T2DM treatments-->Glyxambi 10/5 (stopped due to cost)  Patient's fasting blood sugars are starting to increase 180-230 Will combo farxiga with DPP4 to attempt to gain control --> START QTERN 10/73m44m1 tablet every morning) --SGLT2/DPP4 combo: Samples (30-day supply) given to patient; will need to apply for patient assistance if working May need additional therapy/change, but this will be an easy transition as far as compliance and patient/medication assistance  Current glucose readings: fasting glucose: 180-230, post prandial glucose: n/a Denies hypoglycemic/hyperglycemic symptoms Discussed meal planning options and Plate method for healthy eating Avoid sugary drinks and desserts Incorporate balanced protein, non starchy veggies, 1 serving of carbohydrate with each meal Increase water intake Increase physical activity as able Current exercise: n/a Recommended transition from [place] qtern   Patient Goals/Self-Care Activities Over the next 90 days, patient will:  - take medications as prescribed check glucose  daily (fasting), document, and provide at future appointments collaborate with provider on medication access solutions  Follow Up Plan: Telephone follow up appointment with care management team member scheduled for: 2 weeks      Medication Assistance:  PENDING--MAY HAVE TO APPLY FOR NED MEDICATION  Patient's preferred pharmacy is:  [facility] [address]one: [phone]: [address] [place]one: [phone]: [phone] Up:  Patient agrees to Care Plan and Follow-up.  Plan: Telephone follow up appointment with care management team member scheduled for:  2 WEEKS   JulRegina EckharmD, BCPS Clinical Pharmacist, [place] Phone [phone]

## 2022-01-25 ENCOUNTER — Telehealth: Payer: Self-pay | Admitting: Family Medicine

## 2022-01-25 NOTE — Telephone Encounter (Signed)
Pt did not answer. Left message informing that a 30d supply was sent because he needs to be seen in the office for future refills. He has not seen Dr. Darnell Level since Aug last year. Advised pt to call back for an appt.

## 2022-02-04 DIAGNOSIS — N1832 Chronic kidney disease, stage 3b: Secondary | ICD-10-CM | POA: Insufficient documentation

## 2022-02-04 NOTE — Progress Notes (Signed)
Cardiology Office Note   Date:  02/07/2022   ID:  Timothy [name], DOB 1941/01/07, MRN [id number]  PCP:  Janora Norlander, DO  Cardiologist:   Minus Breeding, MD   Chief Complaint  Patient presents with   Coronary Artery Disease      History of Present Illness: Timothy [name] is a 81 y.o. male who presents for follow up of CAD.   He had a stress perfusion study in 2021 with evidence of infarct but no ischemia.  Since I last saw him he has done well.  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  He was having some discomfort previously and had a negative perfusion study a couple of years ago.  He has been on Imdur since then. The patient denies any new symptoms such as chest discomfort, neck or arm discomfort. There has been no new shortness of breath, PND or orthopnea. There have been no reported palpitations, presyncope or syncope.    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   RIGHT NASAL SIDEWALL- NO TX   BCC (basal cell carcinoma) 11/15/2004   LEFT CHEST- TX CURET X 3, 5FU   BCC (basal cell carcinoma) 11/15/2004   RIGHT EAR- TX CURET X 3, EXCISION   BCC (basal cell carcinoma) 08/13/2005   RIGHT EAR- TX MOHS   BCC (basal cell carcinoma) 04/01/2008   SUPERFICIAL- UPPER LEFT BACK- TX CURET X 3, 5FU   BCC (basal cell carcinoma) 12/23/2012   ULCERATED- ABOVE RIGHT LIP- TX MOHS   Cataract    Chronic kidney disease    Colon polyp    Coronary artery disease    (left main normal, LAD 25-30% stenosis, distal 30-40% stenosis, circumflex obtuse marginal 50% stenosis,  right coronary artery dominant with long 75% followed by mid 80%  stenosis followed by 90% stenosis at the ostium of the PDA.  He had  stenting of the PDA by Dr. [name].  This was a Cypher stent.   First angioplasty was 1994, Cath 2011 100% RCA ).       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   LEFT EAR RIM- TX EXCISION   SCC (squamous cell carcinoma) 02/27/2017   WELL DIFF- RIGHT TEMPLE-TX CURET X3,5FU   SCC (squamous cell carcinoma) 10/14/2019   WELL DIFF- RIGHT SHOULDER-TX CURET X 3, 5FU   SCC (squamous cell carcinoma) 01/24/2019   WELL DIFF- LEFT INNER CHEEK- MOHS   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   ABOVE LEFT OUTER BROW SUPERIOR- TX CURET X 3, 5FU   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   LEFT FOREHEAD-TX CURET X 3, 5FU   SCCA (squamous cell carcinoma) of skin 04/01/2008   RIGHT HAND- TX CURET X 3, 5FU   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   LEFT SIDE OF FACE- TX CURET AFTER  BIOPSY   Sleep apnea    no c-pap   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 (NEEDS TO BE SEEN BEFORE NEXT REFILL) 30 tablet 0   aspirin 81 MG EC tablet Take 81 mg by mouth daily.     carvedilol (COREG) 12.5 MG tablet TAKE (1) TABLET TWICE A DAY WITH MEALS (BREAKFAST  AND SUPPER) 180 tablet 0   Choline Fenofibrate (FENOFIBRIC ACID) 135 MG CPDR Take 135 mg by mouth daily. (NEEDS TO BE SEEN BEFORE NEXT REFILL) 30 capsule 0   Dapagliflozin-sAXagliptin (QTERN) 10-5 MG TABS Take by mouth daily.     enalapril (VASOTEC) 2.5 MG tablet Take 1 tablet (2.5 mg total) by mouth daily. 90 tablet 3   esomeprazole (NEXIUM) 40 MG capsule Take 1 capsule (40 mg total) by mouth daily. (NEEDS TO BE SEEN BEFORE NEXT REFILL) 30 capsule 0   isosorbide mononitrate (IMDUR) 60 MG 24 hr tablet TAKE 1 TABLET DAILY 90 tablet 2   latanoprost (XALATAN) 0.005 % ophthalmic solution Place 1 drop into both eyes at bedtime.     nitroGLYCERIN (NITROSTAT) 0.4 MG SL tablet Place 1 tablet (0.4 mg total) under the tongue every 5 (five) minutes as needed. 25 tablet 4   rosuvastatin (CRESTOR) 20 MG tablet TAKE 1 TABLET ONCE DAILY 90 tablet 3   silodosin (RAPAFLO) 8 MG CAPS capsule Take 1 capsule (8 mg total) by mouth daily with breakfast. 90 capsule 3   VASCEPA 1 g capsule Take 2 capsules (2 g total) by mouth 2 (two) times daily. 360 capsule 3   blood glucose meter kit and supplies Dispense based on patient and insurance preference. Use up to four times daily as directed. (FOR ICD-10 E11.21) 1 each 0   fluticasone (FLONASE) 50 MCG/ACT nasal spray Place 2 sprays into both nostrils daily. (Patient not taking: Reported on 02/07/2022) 16 g 6   glucose blood test strip UAD to test BGs up to 4 times daily E11.21 100 each 12   Lancets MISC UAD to test BGs up to 4 times daily E11.21 100 each 12   Vitamin D, Ergocalciferol, (DRISDOL) 1.25 MG (50000 UNIT) CAPS capsule Take 1 capsule (50,000 Units total) by mouth every 7 (seven) days. (Patient not taking: Reported on 02/07/2022) 12 capsule 0   No current facility-administered medications for this visit.    Allergies:   Hydrocodone-acetaminophen     ROS:  Please see the history of present illness.   Otherwise, review of systems are positive for none.   All other  systems are reviewed and negative.    PHYSICAL EXAM: VS:  BP 110/60    Pulse 74    Ht 5' 7.5" (1.715 m)    Wt 179 lb (81.2 kg)    BMI 27.62 kg/m  , BMI Body mass index  is 27.62 kg/m. GENERAL:  Well appearing NECK:  No jugular venous distention, waveform within normal limits, carotid upstroke brisk and symmetric, no bruits, no thyromegaly LUNGS:  Clear to auscultation bilaterally CHEST:  Unremarkable HEART:  PMI not displaced or sustained,S1 and S2 within normal limits, no S3, no S4, no clicks, no rubs, no murmurs ABD:  Flat, positive bowel sounds normal in frequency in pitch, no bruits, no rebound, no guarding, no midline pulsatile mass, no hepatomegaly, no splenomegaly EXT:  2 plus pulses throughout, no edema, no cyanosis no clubbing   EKG:  EKG is  ordered today. Sinus rhythm, rate 74, axis within normal intervals within normal limits, no acute ST-T wave changes.   Recent Labs: 04/27/2021: Hemoglobin 13.2; Platelets 168 08/04/2021: BUN 23; Creatinine, Ser 1.35; Potassium 4.3; Sodium 138    Lipid Panel    Component Value Date/Time   CHOL 133 01/31/2021 0823   TRIG 398 (H) 01/31/2021 0823   TRIG 538 (H) 09/22/2016 0806   HDL 21 (L) 01/31/2021 0823   HDL 15 (L) 09/22/2016 0806   CHOLHDL 6.3 (H) 01/31/2021 0823   CHOLHDL 4.9 04/08/2013 0827   VLDL 55 (H) 04/08/2013 0827   LDLCALC 52 01/31/2021 0823   LDLCALC 48 07/16/2014 0818   LDLDIRECT 88 03/01/2020 0000      Wt Readings from Last 3 Encounters:  02/07/22 179 lb (81.2 kg)  11/27/21 176 lb (79.8 kg)  08/04/21 180 lb (81.6 kg)      Other studies Reviewed: Additional studies/ records that were reviewed today include: Labs. Review of the above records demonstrates:  Please see elsewhere in the note.     ASSESSMENT AND PLAN:  CAD -  The patient has no new sypmtoms.  No further cardiovascular testing is indicated.  We will continue with aggressive risk reduction and meds as listed.he has had no new symptoms since his  perfusion study which demonstrated old infarct.  No change in therapy.  HTN -  The blood pressure is well controlled.  No change in therapy.   DYSLIPIDEMIA -  LDL was is 52.  No change in therapy.  He is due to have follow-up with Dr. [name].  SLEEP APNEA - He has had a diagnosis of this but he does not really have fatigue.  He never got the CPAP.  I do not think this is indicated.  No change in therapy.  CKD STAGE III - Creatinine was was actually down to 1.35 with a peak of 2.06.  No change in therapy.  DM - This is followed by Janora Norlander, DO.  A1c was up to 8.2 but he had his meds changed to [place] and then more recently to dapagliflozin plus saxagliptin.   He is due to have follow-up.    Current medicines are reviewed at length with the patient today.  The patient does not have concerns regarding medicines.  The following changes have been made:  None  Labs/ tests ordered today include:     Orders Placed This Encounter  Procedures   EKG 12-Lead     Disposition:   FU with me in 12  months   Signed, Minus Breeding, MD  02/07/2022 1:57 PM    [facility]

## 2022-02-07 ENCOUNTER — Other Ambulatory Visit: Payer: Self-pay

## 2022-02-07 ENCOUNTER — Ambulatory Visit (INDEPENDENT_AMBULATORY_CARE_PROVIDER_SITE_OTHER): Payer: Medicare HMO | Admitting: Cardiology

## 2022-02-07 ENCOUNTER — Encounter: Payer: Self-pay | Admitting: Cardiology

## 2022-02-07 VITALS — BP 110/60 | HR 74 | Ht 67.5 in | Wt 179.0 lb

## 2022-02-07 DIAGNOSIS — N1832 Chronic kidney disease, stage 3b: Secondary | ICD-10-CM | POA: Diagnosis not present

## 2022-02-07 DIAGNOSIS — I10 Essential (primary) hypertension: Secondary | ICD-10-CM | POA: Diagnosis not present

## 2022-02-07 DIAGNOSIS — E785 Hyperlipidemia, unspecified: Secondary | ICD-10-CM

## 2022-02-07 DIAGNOSIS — I2510 Atherosclerotic heart disease of native coronary artery without angina pectoris: Secondary | ICD-10-CM | POA: Diagnosis not present

## 2022-02-07 NOTE — Patient Instructions (Signed)
Medication Instructions:  The current medical regimen is effective;  continue present plan and medications.  *If you need a refill on your cardiac medications before your next appointment, please call your pharmacy*  Follow-Up: At [facility], you and your health needs are our priority.  As part of our continuing mission to provide you with exceptional heart care, we have created designated Provider Care Teams.  These Care Teams include your primary Cardiologist (physician) and Advanced Practice Providers (APPs -  Physician Assistants and Nurse Practitioners) who all work together to provide you with the care you need, when you need it.  We recommend signing up for the patient portal called "MyChart".  Sign up information is provided on this After Visit Summary.  MyChart is used to connect with patients for Virtual Visits (Telemedicine).  Patients are able to view lab/test results, encounter notes, upcoming appointments, etc.  Non-urgent messages can be sent to your provider as well.   To learn more about what you can do with MyChart, go to [email or web address].    Your next appointment:   1 year(s)  The format for your next appointment:   In Person  Provider:   James Hochrein, MD   Thank you for choosing [facility]!!    

## 2022-02-09 ENCOUNTER — Telehealth: Payer: Medicare HMO

## 2022-02-13 ENCOUNTER — Ambulatory Visit: Payer: Medicare HMO | Admitting: Family Medicine

## 2022-02-13 ENCOUNTER — Encounter: Payer: Self-pay | Admitting: Family Medicine

## 2022-02-13 VITALS — BP 105/56 | HR 81 | Temp 98.2°F | Ht 67.5 in | Wt 182.2 lb

## 2022-02-13 DIAGNOSIS — E785 Hyperlipidemia, unspecified: Secondary | ICD-10-CM

## 2022-02-13 DIAGNOSIS — E1121 Type 2 diabetes mellitus with diabetic nephropathy: Secondary | ICD-10-CM | POA: Diagnosis not present

## 2022-02-13 DIAGNOSIS — E1169 Type 2 diabetes mellitus with other specified complication: Secondary | ICD-10-CM | POA: Diagnosis not present

## 2022-02-13 DIAGNOSIS — D099 Carcinoma in situ, unspecified: Secondary | ICD-10-CM

## 2022-02-13 DIAGNOSIS — E1159 Type 2 diabetes mellitus with other circulatory complications: Secondary | ICD-10-CM

## 2022-02-13 DIAGNOSIS — I152 Hypertension secondary to endocrine disorders: Secondary | ICD-10-CM

## 2022-02-13 DIAGNOSIS — N1831 Chronic kidney disease, stage 3a: Secondary | ICD-10-CM

## 2022-02-13 LAB — BAYER DCA HB A1C WAIVED: HB A1C (BAYER DCA - WAIVED): 8.3 % — ABNORMAL HIGH (ref 4.8–5.6)

## 2022-02-13 MED ORDER — VASCEPA 1 G PO CAPS: 2.0000 g | ORAL_CAPSULE | Freq: Two times a day (BID) | ORAL | 3 refills | Status: DC

## 2022-02-13 MED ORDER — ROSUVASTATIN CALCIUM 20 MG PO TABS: 20.0000 mg | ORAL_TABLET | Freq: Every day | ORAL | 3 refills | Status: DC

## 2022-02-13 MED ORDER — ESOMEPRAZOLE MAGNESIUM 40 MG PO CPDR: 40.0000 mg | DELAYED_RELEASE_CAPSULE | Freq: Every day | ORAL | 3 refills | Status: DC

## 2022-02-13 MED ORDER — CARVEDILOL 12.5 MG PO TABS: 12.5000 mg | ORAL_TABLET | Freq: Two times a day (BID) | ORAL | 3 refills | Status: DC

## 2022-02-13 MED ORDER — AMLODIPINE BESYLATE 10 MG PO TABS: 10.0000 mg | ORAL_TABLET | Freq: Every day | ORAL | 3 refills | Status: DC

## 2022-02-13 MED ORDER — FENOFIBRIC ACID 135 MG PO CPDR: 135.0000 mg | DELAYED_RELEASE_CAPSULE | Freq: Every day | ORAL | 3 refills | Status: DC

## 2022-02-13 MED ORDER — ENALAPRIL MALEATE 2.5 MG PO TABS: 2.5000 mg | ORAL_TABLET | Freq: Every day | ORAL | 3 refills | Status: DC

## 2022-02-13 NOTE — Progress Notes (Signed)
Subjective: CC:DM PCP: Janora Norlander, DO NID:[id number] Timothy [name] is a 81 y.o. male presenting to clinic today for:  1. Type 2 Diabetes with hypertension, hyperlipidemia:  Reports compliance with his medications.  No visual disturbance, chest pain or shortness of breath reported.  Just started on Qtern.  BGs have already started improving from >220s to 180-200s.  Last eye exam: Will be due in June Last foot exam: Needs Last A1c:  Lab Results  Component Value Date   HGBA1C 8.2 (H) 08/04/2021   Nephropathy screen indicated?:  On ACE inhibitor Last flu, zoster and/or pneumovax:  Immunization History  Administered Date(s) Administered   Fluad Quad(high Dose 65+) 10/18/2020, 10/19/2021   Influenza Whole 08/17/2010   Influenza, High Dose Seasonal PF 09/21/2016, 09/25/2017, 09/24/2018   Influenza,inj,Quad PF,6+ Mos 09/28/2013, 10/11/2014, 10/10/2015, 11/06/2019   Moderna Sars-Covid-2 Vaccination 02/15/2020, 03/14/2020, 11/08/2020   Pneumococcal Conjugate-13 10/11/2014   Pneumococcal Polysaccharide-23 06/16/2008   Td 02/15/2003, 09/20/2011   Tdap 09/17/2011   Zoster, Live 05/18/2007     ROS: Per HPI  Allergies  Allergen Reactions   Hydrocodone-Acetaminophen Other (See Comments)    Makes pt. 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   RIGHT NASAL SIDEWALL- NO TX   BCC (basal cell carcinoma) 11/15/2004   LEFT CHEST- TX CURET X 3, 5FU   BCC (basal cell carcinoma) 11/15/2004   RIGHT EAR- TX CURET X 3, EXCISION   BCC (basal cell carcinoma) 08/13/2005   RIGHT EAR- TX MOHS   BCC (basal cell carcinoma) 04/01/2008   SUPERFICIAL- UPPER LEFT BACK- TX CURET X 3, 5FU   BCC (basal cell carcinoma) 12/23/2012   ULCERATED- ABOVE RIGHT LIP- TX MOHS   Cataract    Chronic kidney disease    Colon polyp    Coronary artery disease    (left main normal, LAD 25-30%  stenosis, distal 30-40% stenosis, circumflex obtuse marginal 50% stenosis,  right coronary artery dominant with long 75% followed by mid 80%  stenosis followed by 90% stenosis at the ostium of the PDA.  He had  stenting of the PDA by Dr. [name].  This was a Cypher stent.   First angioplasty was 1994, Cath 2011 100% RCA ).       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   LEFT EAR RIM- TX EXCISION   SCC (squamous cell carcinoma) 02/27/2017   WELL DIFF- RIGHT TEMPLE-TX CURET X3,5FU   SCC (squamous cell carcinoma) 10/14/2019   WELL DIFF- RIGHT SHOULDER-TX CURET X 3, 5FU   SCC (squamous cell carcinoma) 01/24/2019   WELL DIFF- LEFT INNER CHEEK- MOHS   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 (NEEDS TO BE SEEN BEFORE NEXT REFILL), Disp: 30 tablet, Rfl: 0   aspirin 81 MG EC tablet, Take 81 mg by mouth daily., Disp: , Rfl:    blood glucose meter kit and supplies, Dispense based on patient and insurance preference. Use up to  four times daily as directed. (FOR ICD-10 E11.21), Disp: 1 each, Rfl: 0   carvedilol (COREG) 12.5 MG tablet, TAKE (1) TABLET TWICE A DAY WITH MEALS (BREAKFAST AND SUPPER), Disp: 180 tablet, Rfl: 0   Choline Fenofibrate (FENOFIBRIC ACID) 135 MG CPDR, Take 135 mg by mouth daily. (NEEDS TO BE SEEN BEFORE NEXT REFILL), Disp: 30 capsule, Rfl: 0   Dapagliflozin-sAXagliptin (QTERN) 10-5 MG TABS, Take by mouth daily., Disp: , Rfl:    enalapril (VASOTEC) 2.5 MG tablet, Take 1 tablet (2.5 mg total) by mouth daily., Disp: 90 tablet, Rfl: 3   esomeprazole (NEXIUM) 40 MG capsule, Take 1 capsule (40 mg total) by mouth daily. (NEEDS TO BE SEEN BEFORE NEXT REFILL), Disp: 30 capsule, Rfl: 0   glucose blood test strip, UAD to test BGs up to 4 times daily E11.21, Disp: 100 each, Rfl: 12   isosorbide mononitrate (IMDUR) 60 MG 24 hr tablet, TAKE 1 TABLET DAILY, Disp: 90 tablet, Rfl: 2   Lancets MISC, UAD to test BGs up to 4 times daily E11.21, Disp: 100 each, Rfl: 12   latanoprost (XALATAN) 0.005 % ophthalmic solution, Place 1 drop into both eyes at bedtime., Disp: , Rfl:    nitroGLYCERIN (NITROSTAT) 0.4 MG SL tablet, Place 1 tablet (0.4 mg total) under the tongue every 5 (five) minutes as needed., Disp: 25 tablet, Rfl: 4   rosuvastatin (CRESTOR) 20 MG tablet, TAKE 1 TABLET ONCE DAILY, Disp: 90 tablet, Rfl: 3   silodosin (RAPAFLO) 8 MG CAPS capsule, Take 1 capsule (8 mg total) by mouth daily with breakfast., Disp: 90 capsule, Rfl: 3   VASCEPA 1 g capsule, Take 2 capsules (2 g total) by mouth 2 (two) times daily., Disp: 360 capsule, Rfl: 3 Social History   Socioeconomic History   Marital status: Married    Spouse name: Charlene   Number of children: 2   Years of education: Not on file   Highest education level: Not on file  Occupational History   Occupation: retired  Tobacco Use   Smoking status: Former    Packs/day: 2.00    Years: 25.00    Pack years: 50.00    Types: Cigarettes    Quit date: 08/14/1991     Years since quitting: 30.5    Passive exposure: Never   Smokeless tobacco: Never  Vaping Use   Vaping Use: Never used  Substance and Sexual Activity   Alcohol use: Yes    Comment: rare   Drug use: No   Sexual activity: Yes  Other Topics Concern   Not on file  Social History Narrative   Married x 60 years in 2022.   7 grandchildren.   1 great granddaughter.    Social Determinants of Health   Financial Resource Strain: Low Risk  Difficulty of Paying Living Expenses: Not hard at all  Food Insecurity: No Food Insecurity   Worried About [place] in the Last Year: Never true   Ran Out of Food in the Last Year: Never true  Transportation Needs: No Transportation Needs   Lack of Transportation (Medical): No   Lack of Transportation (Non-Medical): No  Physical Activity: Insufficiently Active   Days of Exercise per Week: 3 days   Minutes of Exercise per Session: 20 min  Stress: No Stress Concern Present   Feeling of Stress : Not at all  Social Connections: Socially Integrated   Frequency of Communication with Friends and Family: More than three times a week   Frequency of Social Gatherings with Friends and Family: More than three times a week   Attends Religious Services: More than 4 times per year   Active Member of [employer] or Organizations: Yes   Attends Music therapist: More than 4 times per year   Marital Status: Married  Human resources officer Violence: Not At Risk   Fear of Current or Ex-Partner: No   Emotionally Abused: No   Physically Abused: No   Sexually Abused: No   Family History  Problem Relation Age of Onset   Cancer Mother        unsure of type   Glaucoma Mother    Heart disease Father    Coronary artery disease Father    Cancer Sister 38       granlocytosis (Wergerner's)   Colon cancer Brother 65   Nephritis Sister        20's   Cancer Brother        lung     Objective: Office vital signs reviewed. BP (!) 105/56    Pulse 81     Temp 98.2 F (36.8 C)    Ht 5' 7.5" (1.715 m)    Wt 182 lb 3.2 oz (82.6 kg)    SpO2 93%    BMI 28.12 kg/m   Physical Examination:  General: Awake, alert, well nourished, No acute distress HEENT: right helix with post surgical changes, EOMI, MMM Cardio: regular rate and rhythm, S1S2 heard, no murmurs appreciated Pulm: clear to auscultation bilaterally, no wheezes, rhonchi or rales; normal work of breathing on room air Extremities: warm, well perfused, No edema, cyanosis or clubbing; +2 pulses bilaterally Neuro: see DM  Diabetic Foot Exam - Simple   Simple Foot Form Diabetic Foot exam was performed with the following findings: Yes 02/13/2022  2:05 PM  Visual Inspection No deformities, no ulcerations, no other skin breakdown bilaterally: Yes Sensation Testing Intact to touch and monofilament testing bilaterally: Yes Pulse Check Posterior Tibialis and Dorsalis pulse intact bilaterally: Yes Comments     Assessment/ Plan: 81 y.o. male   Type 2 diabetes mellitus with diabetic nephropathy, without long-term current use of insulin (HCC) - Plan: Bayer DCA Hb A1c Waived  Stage 3a chronic kidney disease ([place]) - Plan: Renal function panel  Hypertension associated with type 2 diabetes mellitus ([place]) - Plan: amLODipine (NORVASC) 10 MG tablet, carvedilol (COREG) 12.5 MG tablet  Hyperlipidemia associated with type 2 diabetes mellitus (HCC) - Plan: Choline Fenofibrate (FENOFIBRIC ACID) 135 MG CPDR, rosuvastatin (CRESTOR) 20 MG tablet  Squamous cell carcinoma in situ  Sugar is not controlled A1c over 8 today.  He has just started combo Qtern and is already seeing an improvement in blood sugar so I think we will give this another 3 months before making any additional changes.  Check renal function panel given CKD 3A  Future orders for CMP, lipid placed.  Will CC to Dr. Percival Spanish Dr [name].  In the interim medications have been renewed.  He is going to trial off of the amlodipine to see if perhaps  his blood pressure stays within normal range.  He is try to reduce polypharmacy.  He is status post Mohs procedure for SCC of the right helix.  No evidence of complications or secondary bacterial infection  Orders Placed This Encounter  Procedures   Bayer DCA Hb A1c Waived   Renal function panel   No orders of the defined types were placed in this encounter.    Janora Norlander, DO [place] [phone]

## 2022-02-13 NOTE — Patient Instructions (Addendum)
Try going off the Nexium and see how your stomach does.  Technically, you can use this AS needed  Ok to trial off the Amlodipine.  If your BP stays below 140/90, no need to resume unless you just feel bad.  Let's plan for fasting labs in a few months.  I've placed all your orders.  Just need to come in on an empty stomach

## 2022-02-14 LAB — RENAL FUNCTION PANEL
Albumin: 4.1 g/dL (ref 3.7–4.7)
BUN/Creatinine Ratio: 13 (ref 10–24)
BUN: 23 mg/dL (ref 8–27)
CO2: 17 mmol/L — ABNORMAL LOW (ref 20–29)
Calcium: 9.3 mg/dL (ref 8.6–10.2)
Chloride: 102 mmol/L (ref 96–106)
Creatinine, Ser: 1.83 mg/dL — ABNORMAL HIGH (ref 0.76–1.27)
Glucose: 269 mg/dL — ABNORMAL HIGH (ref 70–99)
Phosphorus: 4.2 mg/dL — ABNORMAL HIGH (ref 2.8–4.1)
Potassium: 4.3 mmol/L (ref 3.5–5.2)
Sodium: 138 mmol/L (ref 134–144)
eGFR: 37 mL/min/{1.73_m2} — ABNORMAL LOW (ref >59)

## 2022-02-26 ENCOUNTER — Ambulatory Visit: Payer: Medicare HMO | Admitting: Dermatology

## 2022-02-27 ENCOUNTER — Other Ambulatory Visit: Payer: Self-pay

## 2022-02-27 ENCOUNTER — Ambulatory Visit: Payer: Medicare HMO | Admitting: Dermatology

## 2022-02-27 DIAGNOSIS — L729 Follicular cyst of the skin and subcutaneous tissue, unspecified: Secondary | ICD-10-CM | POA: Diagnosis not present

## 2022-02-27 DIAGNOSIS — C44329 Squamous cell carcinoma of skin of other parts of face: Secondary | ICD-10-CM | POA: Diagnosis not present

## 2022-02-27 DIAGNOSIS — D485 Neoplasm of uncertain behavior of skin: Secondary | ICD-10-CM

## 2022-02-27 DIAGNOSIS — L570 Actinic keratosis: Secondary | ICD-10-CM | POA: Diagnosis not present

## 2022-02-27 DIAGNOSIS — D0439 Carcinoma in situ of skin of other parts of face: Secondary | ICD-10-CM

## 2022-02-27 DIAGNOSIS — Z85828 Personal history of other malignant neoplasm of skin: Secondary | ICD-10-CM

## 2022-02-27 NOTE — Patient Instructions (Signed)

## 2022-02-28 ENCOUNTER — Ambulatory Visit: Payer: Medicare HMO | Admitting: Family Medicine

## 2022-03-06 ENCOUNTER — Telehealth: Payer: Self-pay

## 2022-03-06 ENCOUNTER — Ambulatory Visit: Payer: Medicare HMO | Admitting: Dermatology

## 2022-03-06 NOTE — Telephone Encounter (Signed)
-----   Message from Lavonna Monarch, MD sent at 03/06/2022  6:13 AM EDT ----- ?The squamous cell carcinoma has poorly defined margins.  Unless patient is strongly against Mohs surgery, I would refer him. ?

## 2022-03-06 NOTE — Telephone Encounter (Signed)
Phone call to patient with his pathology results and Dr. Onalee Hua recommendations. Patient aware of results, referral sent to [facility].  ?

## 2022-03-08 ENCOUNTER — Ambulatory Visit: Payer: Medicare HMO | Admitting: Physician Assistant

## 2022-03-10 ENCOUNTER — Encounter: Payer: Self-pay | Admitting: Dermatology

## 2022-03-10 NOTE — Progress Notes (Signed)
? ?Follow-Up Visit ?  ?Subjective  ?Timothy [name] is a 81 y.o. male who presents for the following: Skin Problem (Pt states that he needs he face evaluated due to some sore spots. Pt had mohs(Dr. Mitkov) twice in the same spot on the R ear.). ? ?More sore spots on face, check Mohs site on ear ?Location:  ?Duration:  ?Quality:  ?Associated Signs/Symptoms: ?Modifying Factors:  ?Severity:  ?Timing: ?Context:  ? ?Objective  ?Well appearing patient in no apparent distress; mood and affect are within normal limits. ?Several small facial actinic keratoses, none historically growing or bothering patient ? ?Left Anterior Neck ?White 6 mm deep dermal noninflamed papule ? ?Right Zygomatic Area ?Adjacent waxy superficially eroded crust plus (laterally) heaped up pink crust. ? ? ? ? ? ? ?Left Zygomatic Area ?Waxy pink 1.1 cm crust ? ? ? ? ? ? ?Right Scaphoid Fossa ?No sign residual cancer, moderate scarring ? ? ? ?All skin waist up examined. ? ? ?Assessment & Plan  ? ? ?Cyst of skin ?Left Anterior Neck ? ?May leave if stable ? ?Neoplasm of uncertain behavior of skin ?Right Zygomatic Area ? ?Skin / nail biopsy ?Type of biopsy: tangential   ?Informed consent: discussed and consent obtained   ?Timeout: patient name, date of birth, surgical site, and procedure verified   ?Anesthesia: the lesion was anesthetized in a standard fashion   ?Anesthetic:  1% lidocaine w/ epinephrine 1-100,000 local infiltration ?Instrument used: flexible razor blade   ?Hemostasis achieved with: ferric subsulfate and electrodesiccation   ?Outcome: patient tolerated procedure well   ?Post-procedure details: wound care instructions given   ? ?Specimen 2 - Surgical pathology ?Differential Diagnosis: R/O BCC VS SCC ?[id number] ?Check Margins: No ? 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 ?After shave biopsy the base was treated with curettage plus cautery ? ?Skin / nail biopsy - Left Zygomatic Area ?Type of biopsy:  tangential   ?Informed consent: discussed and consent obtained   ?Timeout: patient name, date of birth, surgical site, and procedure verified   ?Anesthesia: the lesion was anesthetized in a standard fashion   ?Anesthetic:  1% lidocaine w/ epinephrine 1-100,000 local infiltration ?Instrument used: flexible razor blade   ?Hemostasis achieved with: ferric subsulfate and electrodesiccation   ?Outcome: patient tolerated procedure well   ?Post-procedure details: wound care instructions given   ? ?Destruction of lesion - Left Zygomatic Area ?Complexity: simple   ?Destruction method: electrodesiccation and curettage   ?Informed consent: discussed and consent obtained   ?Timeout:  patient name, date of birth, surgical site, and procedure verified ?Anesthesia: the lesion was anesthetized in a standard fashion   ?Anesthetic:  1% lidocaine w/ epinephrine 1-100,000 local infiltration ?Curettage performed in three different directions: Yes   ?Electrodesiccation performed over the curetted area: Yes   ?Curettage cycles:  3 ?Lesion length (cm):  1.1 ?Lesion width (cm):  1.1 ?Margin per side (cm):  0 ?Final wound size (cm):  1.1 ?Hemostasis achieved with:  ferric subsulfate ?Outcome: patient tolerated procedure well with no complications   ?Post-procedure details: wound care instructions given   ? ?Specimen 1 - Surgical pathology ?Differential Diagnosis: R/O BCC VS SCC - TXPBX ? ?Check Margins: No ? ?Personal history of skin cancer ?Right Scaphoid Fossa ? ?Check as needed change ? ?AK (actinic keratosis) ? ?Intervention deferred. ? ? ? ? ? ?I, Lavonna Monarch, MD, have reviewed all documentation for this visit.  The documentation on 03/10/22 for the exam, diagnosis, procedures,  and orders are all accurate and complete. ?

## 2022-04-10 ENCOUNTER — Ambulatory Visit (INDEPENDENT_AMBULATORY_CARE_PROVIDER_SITE_OTHER): Payer: Medicare HMO

## 2022-04-10 ENCOUNTER — Encounter: Payer: Self-pay | Admitting: Nurse Practitioner

## 2022-04-10 ENCOUNTER — Telehealth: Payer: Self-pay

## 2022-04-10 ENCOUNTER — Other Ambulatory Visit: Payer: Self-pay

## 2022-04-10 ENCOUNTER — Ambulatory Visit (INDEPENDENT_AMBULATORY_CARE_PROVIDER_SITE_OTHER): Payer: Medicare HMO | Admitting: Nurse Practitioner

## 2022-04-10 VITALS — BP 127/70 | HR 79 | Ht 67.5 in

## 2022-04-10 DIAGNOSIS — M25531 Pain in right wrist: Secondary | ICD-10-CM

## 2022-04-10 DIAGNOSIS — W19XXXA Unspecified fall, initial encounter: Secondary | ICD-10-CM

## 2022-04-10 DIAGNOSIS — M778 Other enthesopathies, not elsewhere classified: Secondary | ICD-10-CM

## 2022-04-10 MED ORDER — QTERN 10-5 MG PO TABS: 1.0000 | ORAL_TABLET | Freq: Every morning | ORAL | 11 refills | Status: DC

## 2022-04-10 MED ORDER — PREDNISONE 20 MG PO TABS
40.0000 mg | ORAL_TABLET | Freq: Every day | ORAL | 0 refills | Status: AC
Start: 2022-04-10 — End: 2022-04-15

## 2022-04-10 NOTE — Progress Notes (Signed)
? ?  Subjective:  ? ? Patient ID: Timothy [name], male    DOB: 04/23/1941, 81 y.o.   MRN: [id number] ? ? ?Chief Complaint: Wrist Pain (Right- recent fall ( last week) site is swollen, painful, unable to turn) ? ? ?Wrist Pain  ? ?Patient come sin with right wrist pain. He fell off a later a week ago but does not recall a injury. Last night he reached out for a bottle  of water and felt a pain in his right wrist and it has been hurting every since. He rates pain 9/10 currently holding it still decreases pain. Any movement causes pain.  ? ? ? ?Review of Systems  ?Constitutional:  Negative for diaphoresis.  ?Eyes:  Negative for pain.  ?Respiratory:  Negative for shortness of breath.   ?Cardiovascular:  Negative for chest pain, palpitations and leg swelling.  ?Gastrointestinal:  Negative for abdominal pain.  ?Endocrine: Negative for polydipsia.  ?Skin:  Negative for rash.  ?Neurological:  Negative for dizziness, weakness and headaches.  ?Hematological:  Does not bruise/bleed easily.  ?All other systems reviewed and are negative. ? ?   ?Objective:  ? Physical Exam ?Vitals reviewed.  ?Constitutional:   ?   Appearance: Normal appearance.  ?Cardiovascular:  ?   Rate and Rhythm: Normal rate and regular rhythm.  ?   Heart sounds: Normal heart sounds.  ?Pulmonary:  ?   Effort: Pulmonary effort is normal.  ?   Breath sounds: Normal breath sounds.  ?Skin: ?   General: Skin is warm.  ?Neurological:  ?   General: No focal deficit present.  ?   Mental Status: He is alert and oriented to person, place, and time.  ?Psychiatric:     ?   Mood and Affect: Mood normal.     ?   Behavior: Behavior normal.  ? ? ?BP 127/70   Pulse 79   Ht 5' 7.5" (1.715 m)   SpO2 94%   BMI 28.12 kg/m?  ? ? ? ?   ?Assessment & Plan:  ? ?[name] in today with chief complaint of Wrist Pain (Right- recent fall ( last week) site is swollen, painful, unable to turn) ? ? ?1. Right wrist tendonitis ?Rest ?Ice BID ?Wrist brace ?Prednisone will raise blood  sugars a little- watch diet closely ?Meds ordered this encounter  ?Medications  ? predniSONE (DELTASONE) 20 MG tablet  ?  Sig: Take 2 tablets (40 mg total) by mouth daily with breakfast for 5 days. 2 po daily for 5 days  ?  Dispense:  10 tablet  ?  Refill:  0  ?  Order Specific Question:   Supervising Provider  ?  Answer:   Caryl Pina A [[id number]]  ? ? ? ? ? ?The above assessment and management plan was discussed with the patient. The patient verbalized understanding of and has agreed to the management plan. Patient is aware to call the clinic if symptoms persist or worsen. Patient is aware when to return to the clinic for a follow-up visit. Patient educated on when it is appropriate to go to the emergency department.  ? ?Mary-Margaret Hassell Done, FNP ? ? ?

## 2022-04-10 NOTE — Telephone Encounter (Signed)
Patient states that he has samples of Wilder Glade that came in the mail. He no longer takes. He has not opened them. Pt wants to know if [facility] will take them.  Also, instead of [place] he was supposed to receive Qtern. They have not come yet. Pt wanted to make [facility] aware. ?

## 2022-04-10 NOTE — Telephone Encounter (Signed)
Can we make sure he is enrolled for qtern 10/'5mg'$  tablets with [place]&me  ? ?Patient received shipment of [place]? ? ?Thank you!! I just escribed qtern (& put in notes to cancel farxiga)  I thought I had already done this but not sure!  If I need to submit new app for qtern--just let me know! ?

## 2022-04-10 NOTE — Addendum Note (Signed)
Addended by: Lottie Dawson D on: 04/10/2022 01:54 PM ? ? Modules accepted: Orders ? ?

## 2022-04-10 NOTE — Patient Instructions (Signed)
Tendinitis  Tendinitis is irritation and swelling (inflammation) of a tendon. A tendon is a cord of tissue that connects muscle to bone. Tendinitis is most common in the shoulder, ankle, elbow, or wrist. What are the causes? Using a tendon or muscle too much (overuse). This is the most common cause. Wear and tear that happens as you age. Injury. Some medical conditions, such as arthritis. Some medicines. What increases the risk? You are more likely to get this condition if you do activities that involve the same movements over and over again (repetitive motions). What are the signs or symptoms? Pain. Tenderness. Mild swelling. Decreased range of motion. How is this treated? This condition is usually treated with RICE therapy. RICE stands for: Rest. Ice. Compression. This means putting pressure on the affected area. Elevation. This means raising the affected area above the level of your heart. Treatment may also include: Medicines for swelling or pain. Exercises or physical therapy to help your tendon move better and get stronger. A brace or splint. A shot (injection) of a type of medicine called corticosteroid. Surgery. This is rarely needed. Follow these instructions at home: If you have a splint or brace that can be taken off: Wear the splint or brace as told by your doctor. Take it off only as told by your doctor. Check the skin around the splint or brace every day. Tell your doctor if you see problems. Loosen the splint or brace if your fingers or toes: Tingle. Become numb. Turn cold and blue. Keep the splint or brace clean. If the splint or brace is not waterproof: Do not let it get wet. Cover it with a watertight covering when you take a bath or shower, or take it off as told by your doctor. Managing pain, stiffness, and swelling     If told, put ice on the affected area. To do this: If you have a removable splint or brace, take it off as told by your doctor. Put  ice in a plastic bag. Place a towel between your skin and the bag. Leave the ice on for 20 minutes, 2-3 times a day. Take off the ice if your skin turns bright red. This is very important. If you cannot feel pain, heat, or cold, you have a greater risk of damage to the area. Move the fingers or toes of the affected arm or leg often, if this applies. If told, raise the affected area above the level of your heart while you are sitting or lying down. If told, put heat on the affected area before you exercise. Use the heat source that your doctor recommends, such as a moist heat pack or a heating pad. Place a towel between your skin and the heat source. Leave the heat on for 20-30 minutes. Take off the heat if your skin turns bright red. This is very important. If you cannot feel pain, heat, or cold, you have a greater risk of getting burned. Activity Rest the affected area as told by your doctor. Ask your doctor when it is safe to drive if you have a splint or brace on any part of your arm or leg. Return to your normal activities when your doctor says that it is safe. Avoid using the affected area while you have symptoms. Do exercises as told by your doctor. General instructions Wear an elastic bandage or pressure (compression) wrap only as told by your doctor. Take over-the-counter and prescription medicines only as told by your doctor. Keep all follow-up   visits. Contact a doctor if: You do not get better. You get new problems, such as numbness in your hands or feet, and you do not know why. Summary Tendinitis is irritation and swelling (inflammation) of a tendon. You are more likely to get this condition if you do activities that involve the same movements over and over again. This condition is usually treated with RICE therapy. RICE stands for rest, ice, compression, and elevate. Avoid using the affected area while you have symptoms. This information is not intended to replace advice  given to you by your health care provider. Make sure you discuss any questions you have with your health care provider. Document Revised: 08/10/2021 Document Reviewed: 08/10/2021 Elsevier Patient Education  2023 [employer]  

## 2022-04-30 ENCOUNTER — Other Ambulatory Visit: Payer: Medicare HMO

## 2022-04-30 ENCOUNTER — Other Ambulatory Visit: Payer: Self-pay

## 2022-04-30 DIAGNOSIS — N1831 Chronic kidney disease, stage 3a: Secondary | ICD-10-CM

## 2022-04-30 DIAGNOSIS — E1121 Type 2 diabetes mellitus with diabetic nephropathy: Secondary | ICD-10-CM

## 2022-04-30 LAB — BAYER DCA HB A1C WAIVED: HB A1C (BAYER DCA - WAIVED): 8.2 % — ABNORMAL HIGH (ref 4.8–5.6)

## 2022-04-30 NOTE — Progress Notes (Signed)
Will call pt once all labs have resulted  ?

## 2022-05-01 ENCOUNTER — Other Ambulatory Visit: Payer: Self-pay | Admitting: Family Medicine

## 2022-05-01 DIAGNOSIS — E559 Vitamin D deficiency, unspecified: Secondary | ICD-10-CM

## 2022-05-01 LAB — CMP14+EGFR
ALT: 20 IU/L (ref 0–44)
AST: 20 IU/L (ref 0–40)
Albumin/Globulin Ratio: 1.6 (ref 1.2–2.2)
Albumin: 4.2 g/dL (ref 3.7–4.7)
Alkaline Phosphatase: 43 IU/L — ABNORMAL LOW (ref 44–121)
BUN/Creatinine Ratio: 16 (ref 10–24)
BUN: 23 mg/dL (ref 8–27)
Bilirubin Total: 0.3 mg/dL (ref 0.0–1.2)
CO2: 20 mmol/L (ref 20–29)
Calcium: 9.1 mg/dL (ref 8.6–10.2)
Chloride: 104 mmol/L (ref 96–106)
Creatinine, Ser: 1.44 mg/dL — ABNORMAL HIGH (ref 0.76–1.27)
Globulin, Total: 2.7 g/dL (ref 1.5–4.5)
Glucose: 212 mg/dL — ABNORMAL HIGH (ref 70–99)
Potassium: 4.3 mmol/L (ref 3.5–5.2)
Sodium: 139 mmol/L (ref 134–144)
Total Protein: 6.9 g/dL (ref 6.0–8.5)
eGFR: 49 mL/min/{1.73_m2} — ABNORMAL LOW (ref >59)

## 2022-05-01 LAB — CBC WITH DIFFERENTIAL/PLATELET
Basophils Absolute: 0 10*3/uL (ref 0.0–0.2)
Basos: 1 %
EOS (ABSOLUTE): 0.1 10*3/uL (ref 0.0–0.4)
Eos: 2 %
Hematocrit: 40.8 % (ref 37.5–51.0)
Hemoglobin: 13.8 g/dL (ref 13.0–17.7)
Immature Grans (Abs): 0 10*3/uL (ref 0.0–0.1)
Immature Granulocytes: 1 %
Lymphocytes Absolute: 1.8 10*3/uL (ref 0.7–3.1)
Lymphs: 32 %
MCH: 29.5 pg (ref 26.6–33.0)
MCHC: 33.8 g/dL (ref 31.5–35.7)
MCV: 87 fL (ref 79–97)
Monocytes Absolute: 0.4 10*3/uL (ref 0.1–0.9)
Monocytes: 7 %
Neutrophils Absolute: 3.3 10*3/uL (ref 1.4–7.0)
Neutrophils: 57 %
Platelets: 125 10*3/uL — ABNORMAL LOW (ref 150–450)
RBC: 4.68 x10E6/uL (ref 4.14–5.80)
RDW: 13.4 % (ref 11.6–15.4)
WBC: 5.6 10*3/uL (ref 3.4–10.8)

## 2022-05-01 LAB — LIPID PANEL
Chol/HDL Ratio: 7.1 ratio — ABNORMAL HIGH (ref 0.0–5.0)
Cholesterol, Total: 171 mg/dL (ref 100–199)
HDL: 24 mg/dL — ABNORMAL LOW (ref >39)
LDL Chol Calc (NIH): 76 mg/dL (ref 0–99)
Triglycerides: 441 mg/dL — ABNORMAL HIGH (ref 0–149)
VLDL Cholesterol Cal: 71 mg/dL — ABNORMAL HIGH (ref 5–40)

## 2022-05-01 LAB — VITAMIN D 25 HYDROXY (VIT D DEFICIENCY, FRACTURES): Vit D, 25-Hydroxy: 13.8 ng/mL — ABNORMAL LOW (ref 30.0–100.0)

## 2022-05-01 MED ORDER — VITAMIN D (ERGOCALCIFEROL) 1.25 MG (50000 UNIT) PO CAPS: 50000.0000 [IU] | ORAL_CAPSULE | ORAL | 0 refills | Status: DC

## 2022-05-08 ENCOUNTER — Encounter: Payer: Self-pay | Admitting: Internal Medicine

## 2022-05-08 ENCOUNTER — Ambulatory Visit: Payer: Medicare HMO | Admitting: Internal Medicine

## 2022-05-08 VITALS — BP 160/76 | HR 65 | Ht 67.05 in | Wt 181.2 lb

## 2022-05-08 DIAGNOSIS — E785 Hyperlipidemia, unspecified: Secondary | ICD-10-CM | POA: Diagnosis not present

## 2022-05-08 DIAGNOSIS — I10 Essential (primary) hypertension: Secondary | ICD-10-CM

## 2022-05-08 DIAGNOSIS — I2510 Atherosclerotic heart disease of native coronary artery without angina pectoris: Secondary | ICD-10-CM | POA: Diagnosis not present

## 2022-05-08 DIAGNOSIS — E781 Pure hyperglyceridemia: Secondary | ICD-10-CM

## 2022-05-08 MED ORDER — ROSUVASTATIN CALCIUM 5 MG PO TABS: 5.0000 mg | ORAL_TABLET | Freq: Every day | ORAL | 3 refills | Status: DC

## 2022-05-08 NOTE — Patient Instructions (Addendum)
Medication Instructions:  START rosuvastatin (crestor) '5mg'$  daily  Vascepa samples x5 boxes provided  [email or web address] >> [email or web address] access  *If you need a refill on your cardiac medications before your next appointment, please call your pharmacy*   Lab Work: FASTING lab work to check cholesterol   If you have labs (blood work) drawn today and your tests are completely normal, you will receive your results only by: [facility] (if you have MyChart) OR A paper copy in the mail If you have any lab test that is abnormal or we need to change your treatment, we will call you to review the results.  Follow-Up: At [facility], you and your health needs are our priority.  As part of our continuing mission to provide you with exceptional heart care, we have created designated Provider Care Teams.  These Care Teams include your primary Cardiologist (physician) and Advanced Practice Providers (APPs -  Physician Assistants and Nurse Practitioners) who all work together to provide you with the care you need, when you need it.  We recommend signing up for the patient portal called "MyChart".  Sign up information is provided on this After Visit Summary.  MyChart is used to connect with patients for Virtual Visits (Telemedicine).  Patients are able to view lab/test results, encounter notes, upcoming appointments, etc.  Non-urgent messages can be sent to your provider as well.   To learn more about what you can do with MyChart, go to [email or web address].    Your next appointment:   6 month(s)  The format for your next appointment:   In Person  Provider:  Dr Lyman Bishop - lipid clinic

## 2022-05-08 NOTE — Progress Notes (Unsigned)
OFFICE CONSULT NOTE  Chief Complaint: Follow-up dyslipidemia  Primary Care Physician: Janora Norlander, DO  HPI:  Timothy [name] is a 81 y.o. male who is being seen today for the evaluation of dyslipidemia at the request of [name], MD. This is a pleasant 81 year old male patient of Dr. Percival Spanish who sees Dr. Lynnette Caffey his primary care provider.  He is referred for evaluation and management of dyslipidemia.  He has a strong history of coronary artery disease with multiple heart catheterizations and prior coronary intervention, dating back to 6.  He also has chronic kidney disease, hypertension and type 2 diabetes.  He had significant dyslipidemia with primary elevated LDL, triglycerides and low HDL less than 20.  This is a typical diabetic or metabolic dyslipidemia.  Recent labs were 5 months ago indicating a total cholesterol 147, triglycerides 510, HDL 18 and LDL was not calculated.  He also had a lipoprotein NMR 2 years ago which showed LDL particle number of 1229 and triglycerides again over 500 with a high small LDL particle number of 1086.  Mr. [name] has a history of intolerance to atorvastatin which caused hot flashes.  He is currently on rosuvastatin 10 mg but has not tried higher doses.  He also is written to take fenofibrate 135 mg daily and Vascepa 2 g twice daily.  After much discussion it seems that he is not regularly taking the Vascepa.  He said initially was that they are larger capsules and did not fit in his pillbox therefore he has a separate container but often forgets to take the medication.  Review of filled prescriptions show that he did not get a scription filled for Vascepa since 2017.  We do know that he was getting some samples from his primary care provider however the suggest that compliance with the medication is not where it needs to be.  He does report somewhat of an atherogenic diet and denies any routine alcohol use.  With regards to A1c, this was most  recently 6.8, from 6.110 months ago.  01/08/2019  Mr. Shaff is seen today in follow-up.  He reports compliance with Vascepa 2 g twice daily.  He says he may have only missed about 5 doses over the past several months.  I suspect this will make a big difference in his numbers however unfortunately he did not have his fasting lipid profile obtained.  Also he is nonfasting today.  This could significantly affect his lipid profile.  We will need to repeat the lipids as previously recommended.  Overall he is tolerating medication.  03/04/2020  Mr. [name] returns for annual follow-up.  I am pleased to report his lipids are even better.  His total cholesterol now is 148, triglycerides 247 (down from over 500), HDL 25 and LDL 82.  There is been a slight uptake in his LDL cholesterol.  He is on 10 mg of rosuvastatin.  03/14/2021  Mr. [name] is seen today in follow-up.  He seems to be doing well.  He saw Dr. Percival Spanish in January and seemed to be doing well from a cardiac standpoint.  He had repeat lipids showed total cholesterol 133, triglycerides 398, HDL 21 and LDL 52.  He reports compliance with the Vascepa, fenofibrate and rosuvastatin 20 mg daily.  Overall seems to be at target on this medication regimen.  Although his triglycerides are not "normal", he is still receiving benefit from the Vascepa.  05/08/2022  Mr. [name] returns today for follow-up.  He had recent repeat  lipids which do show an increase in triglycerides now up to 441, increased from 398 a year ago.  He is total cholesterol is 171, HDL 24 and LDL 76.  Blood pressure was also noted to be elevated today however he is reportedly not taking amlodipine.  Current medicines include Vascepa 2 g twice daily and fenofibrate 135 mg daily.  He has not on a statin.  He reports no history of intolerance in the past.  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   SUPERFICIAL- LEFT CHEST- NO TX   BCC (basal cell  carcinoma) 08/05/1997   RIGHT NASAL SIDEWALL- NO TX   BCC (basal cell carcinoma) 11/15/2004   LEFT CHEST- TX CURET X 3, 5FU   BCC (basal cell carcinoma) 11/15/2004   RIGHT EAR- TX CURET X 3, EXCISION   BCC (basal cell carcinoma) 08/13/2005   RIGHT EAR- TX MOHS   BCC (basal cell carcinoma) 04/01/2008   SUPERFICIAL- UPPER LEFT BACK- TX CURET X 3, 5FU   BCC (basal cell carcinoma) 12/23/2012   ULCERATED- ABOVE RIGHT LIP- TX MOHS   Cataract    Chronic kidney disease    Colon polyp    Coronary artery disease    (left main normal, LAD 25-30% stenosis, distal 30-40% stenosis, circumflex obtuse marginal 50% stenosis,  right coronary artery dominant with long 75% followed by mid 80%  stenosis followed by 90% stenosis at the ostium of the PDA.  He had  stenting of the PDA by Dr. [name].  This was a Cypher stent.   First angioplasty was 1994, Cath 2011 100% RCA ).       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/27/2017   WELL DIFF- RIGHT TEMPLE-TX CURET X3,5FU   SCC (squamous cell carcinoma) 10/14/2019   WELL DIFF- RIGHT SHOULDER-TX CURET X 3, 5FU   SCC (squamous cell carcinoma) 01/24/2019   WELL DIFF- LEFT INNER CHEEK- MOHS   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   ABOVE LEFT OUTER BROW SUPERIOR- TX CURET X 3, 5FU   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   LEFT FOREHEAD-TX CURET X 3, 5FU   SCCA (squamous cell carcinoma) of skin 04/01/2008   RIGHT HAND- TX CURET X 3, 5FU    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   LEFT SIDE OF FACE- TX CURET AFTER BIOPSY   SCCA (squamous cell carcinoma) of skin 01/12/2021   Neck - anterior (in situ)   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/27/2022   Left Zygomatic Area (in situ) (tx p bx)   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/27/2022   Right Zygomatic Area (well diff)   Sleep apnea    no c-pap   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   in situ- dorsum of nose (CX35FU)    Past Surgical History:  Procedure Laterality Date   APPENDECTOMY  CERVICAL SPINE SURGERY     CHOLECYSTECTOMY     COLONOSCOPY  2004   multiple since   CORONARY ANGIOPLASTY WITH STENT PLACEMENT     3 sents   EYE SURGERY Bilateral    cataracts   HERNIA REPAIR Bilateral    MOHS SURGERY Right 12/2021   SKIN CANCER EXCISION  03/17/2017   SKIN CANCER EXCISION  03/17/2020   left side of face    FAMHx:  Family History  Problem Relation Age of Onset   Cancer Mother        unsure of type   Glaucoma Mother    Heart disease Father    Coronary artery disease Father    Cancer Sister 14       granlocytosis (Wergerner's)   Colon cancer Brother 5   Nephritis Sister        20's   Cancer Brother        lung     SOCHx:   reports that he quit smoking about 30 years ago. His smoking use included cigarettes. He has a 50.00 pack-year smoking history. He has never been exposed to tobacco smoke. He has never used smokeless tobacco. He reports current alcohol use. He reports that he does not use drugs.  ALLERGIES:  Allergies  Allergen Reactions   Hydrocodone-Acetaminophen Other (See Comments)    Makes pt. Feel loopy    ROS: Pertinent items noted in HPI and remainder of comprehensive ROS otherwise negative.  HOME MEDS: Current Outpatient Medications on File Prior to Visit  Medication Sig Dispense Refill   aspirin 81 MG EC tablet Take 81 mg by mouth daily.     blood glucose meter kit and supplies Dispense based on patient  and insurance preference. Use up to four times daily as directed. (FOR ICD-10 E11.21) 1 each 0   carvedilol (COREG) 12.5 MG tablet Take 1 tablet (12.5 mg total) by mouth 2 (two) times daily with a meal. 180 tablet 3   Choline Fenofibrate (FENOFIBRIC ACID) 135 MG CPDR Take 135 mg by mouth daily. 90 capsule 3   Dapagliflozin-sAXagliptin (QTERN) 10-5 MG TABS Take 1 tablet by mouth in the morning. 90 tablet 11   enalapril (VASOTEC) 2.5 MG tablet Take 1 tablet (2.5 mg total) by mouth daily. 90 tablet 3   esomeprazole (NEXIUM) 40 MG capsule Take 1 capsule (40 mg total) by mouth daily. Put on hold 90 capsule 3   glucose blood test strip UAD to test BGs up to 4 times daily E11.21 100 each 12   isosorbide mononitrate (IMDUR) 60 MG 24 hr tablet TAKE 1 TABLET DAILY 90 tablet 2   Lancets MISC UAD to test BGs up to 4 times daily E11.21 100 each 12   latanoprost (XALATAN) 0.005 % ophthalmic solution Place 1 drop into both eyes at bedtime.     nitroGLYCERIN (NITROSTAT) 0.4 MG SL tablet Place 1 tablet (0.4 mg total) under the tongue every 5 (five) minutes as needed. 25 tablet 4   silodosin (RAPAFLO) 8 MG CAPS capsule Take 1 capsule (8 mg total) by mouth daily with breakfast. 90 capsule 3   VASCEPA 1 g capsule Take 2 capsules (2 g total) by mouth 2 (two) times daily. 360 capsule 3   Vitamin D, Ergocalciferol, (DRISDOL) 1.25 MG (50000 UNIT) CAPS capsule Take 1 capsule (50,000 Units total) by mouth every 7 (seven) days. 5 capsule 0   amLODipine (NORVASC) 10 MG tablet Take 1 tablet (10 mg total) by mouth daily. Put on  hold (Patient not taking: Reported on 05/08/2022) 90 tablet 3   No current facility-administered medications on file prior to visit.    LABS/IMAGING: No results found for this or any previous visit (from the past 48 hour(s)). No results found.  LIPID PANEL:    Component Value Date/Time   CHOL 171 04/30/2022 0823   TRIG 441 (H) 04/30/2022 0823   TRIG 538 (H) 09/22/2016 0806   HDL 24 (L)  04/30/2022 0823   HDL 15 (L) 09/22/2016 0806   CHOLHDL 7.1 (H) 04/30/2022 0823   CHOLHDL 4.9 04/08/2013 0827   VLDL 55 (H) 04/08/2013 0827   LDLCALC 76 04/30/2022 0823   LDLCALC 48 07/16/2014 0818   LDLDIRECT 88 03/01/2020 0000    WEIGHTS: Wt Readings from Last 3 Encounters:  05/08/22 181 lb 3.2 oz (82.2 kg)  02/13/22 182 lb 3.2 oz (82.6 kg)  02/07/22 179 lb (81.2 kg)    VITALS: BP (!) 160/76   Pulse 65   Ht 5' 7.05" (1.703 m)   Wt 181 lb 3.2 oz (82.2 kg)   SpO2 96%   BMI 28.34 kg/m   EXAM: Deferred  EKG: Deferred  ASSESSMENT: Coronary artery disease status post prior PCI Mixed dyslipidemia (elevated LDL and triglycerides, low HDL)  PLAN: 1.   Mr. Hamblen has had further increase in his triglycerides and LDL remains above target.  For some reason he is not currently on a statin.  This would be additionally beneficial for both his triglycerides and LDL.  Would advise starting rosuvastatin 5 mg daily in addition to his fenofibrate and Vascepa.  Plan repeat lipids in 3 to 4 months and we will follow-up with that afterwards.  Pixie Casino, MD, [facility], [place] Director of the Advanced Lipid Disorders &  Cardiovascular Risk Reduction Clinic Diplomate of the American Board of Clinical Lipidology Attending Cardiologist  Direct Dial: [phone]  Fax: [phone]  Website:  [email or web address]  Nadean Corwin Malvina Schadler 05/08/2022, 2:42 PM

## 2022-05-10 ENCOUNTER — Telehealth: Payer: Self-pay | Admitting: Internal Medicine

## 2022-05-10 ENCOUNTER — Other Ambulatory Visit: Payer: Self-pay

## 2022-05-10 MED ORDER — ROSUVASTATIN CALCIUM 20 MG PO TABS: 20.0000 mg | ORAL_TABLET | Freq: Every day | ORAL | 3 refills | Status: DC

## 2022-05-10 MED ORDER — EZETIMIBE 10 MG PO TABS: 10.0000 mg | ORAL_TABLET | Freq: Every day | ORAL | 3 refills | Status: DC

## 2022-05-10 NOTE — Telephone Encounter (Signed)
Called patient. He states he recently saw Dr.Hilty- he recommended to start Rosuvastatin 5 mg- however patient states he did not know he was already on 20 mg. He would like to know what to do. Also was on medication list during visit with Dr.Hochrein- patient is not sure how it was removed from his list, but he is still currently taking the 20 mg.  Advised patient I would check with MD and call him back. Thanks!     Per notes:  03/14/2021   Timothy [name] is seen today in follow-up.  He seems to be doing well.  He saw Dr. Percival Spanish in January and seemed to be doing well from a cardiac standpoint.  He had repeat lipids showed total cholesterol 133, triglycerides 398, HDL 21 and LDL 52.  He reports compliance with the Vascepa, fenofibrate and rosuvastatin 20 mg daily.  Overall seems to be at target on this medication regimen.  Although his triglycerides are not "normal", he is still receiving benefit from the Vascepa.   05/08/2022  Timothy [name] returns today for follow-up.  He had recent repeat lipids which do show an increase in triglycerides now up to 441, increased from 398 a year ago.  He is total cholesterol is 171, HDL 24 and LDL 76.  Blood pressure was also noted to be elevated today however he is reportedly not taking amlodipine.  Current medicines include Vascepa 2 g twice daily and fenofibrate 135 mg daily.  He has not on a statin.  He reports no history of intolerance in the past.

## 2022-05-10 NOTE — Telephone Encounter (Signed)
Pt c/o medication issue:  1. Name of Medication: rosuvastatin (CRESTOR) 5 MG tablet  2. How are you currently taking this medication (dosage and times per day)? 20 mg  3. Are you having a reaction (difficulty breathing--STAT)? No  4. What is your medication issue? Was prescribed 5 mg of this medication, but was already on 20 mg and would like to be advised on what to do.

## 2022-05-10 NOTE — Telephone Encounter (Signed)
Just FYI for med- I updated RX list- he would like to try the Zetia 10 mg, I sent to pharmacy as well.  Thanks!

## 2022-05-10 NOTE — Telephone Encounter (Signed)
Yes .. that was not on his med list. Would continue Crestor 20 mg daily - would he be ok to add zetia 10 mg daily? (A non-statin - will help lower triglycerides and LDL).    Dr. Lemmie Evens

## 2022-05-16 ENCOUNTER — Encounter: Payer: Self-pay | Admitting: Family Medicine

## 2022-05-16 ENCOUNTER — Ambulatory Visit: Payer: Medicare HMO | Admitting: Family Medicine

## 2022-05-16 VITALS — BP 119/65 | HR 76 | Temp 97.2°F | Resp 20 | Ht 67.0 in | Wt 178.0 lb

## 2022-05-16 DIAGNOSIS — E1169 Type 2 diabetes mellitus with other specified complication: Secondary | ICD-10-CM

## 2022-05-16 DIAGNOSIS — E1159 Type 2 diabetes mellitus with other circulatory complications: Secondary | ICD-10-CM | POA: Diagnosis not present

## 2022-05-16 DIAGNOSIS — M778 Other enthesopathies, not elsewhere classified: Secondary | ICD-10-CM | POA: Diagnosis not present

## 2022-05-16 DIAGNOSIS — I152 Hypertension secondary to endocrine disorders: Secondary | ICD-10-CM

## 2022-05-16 DIAGNOSIS — E1121 Type 2 diabetes mellitus with diabetic nephropathy: Secondary | ICD-10-CM

## 2022-05-16 DIAGNOSIS — E785 Hyperlipidemia, unspecified: Secondary | ICD-10-CM

## 2022-05-16 MED ORDER — GLIPIZIDE 5 MG PO TABS: 5.0000 mg | ORAL_TABLET | Freq: Every day | ORAL | 1 refills | Status: DC

## 2022-05-16 NOTE — Patient Instructions (Signed)
Recheck sugar in 3 months Glipizide '5mg'$  with breakfast ordered to get better control of sugars.  Continue all other meds as directed.

## 2022-05-16 NOTE — Progress Notes (Signed)
Subjective: CC:DM PCP: Janora Norlander, DO ZOX:[id number] Timothy [name] is a 81 y.o. male presenting to clinic today for:  1. Type 2 Diabetes with hypertension, hyperlipidemia w/ CKD3:  Treated with Vergia Alcon. Negligible change in sugar since February.  Treated with steroids recently for wrist issue.  He notes that blood sugars have been running around 1 50-2 100s.  This morning when he woke up it was in the 160s.  He typically eats a very low carb with morning meals typically consisting only of bacon and eggs.  He was recently placed on an additional cholesterol medicine as his triglycerides remain elevated despite use of Crestor, Vascepa and fenofibrate.  Zetia was added.  No reports of intolerance of the medicine so far.  Has fasting lipid preordered by his cardiologist.  Last eye exam: UTD Last foot exam: UTD until 05/2022 Last A1c:  Lab Results  Component Value Date   HGBA1C 8.2 (H) 04/30/2022   Nephropathy screen indicated?: on ACE-I Last flu, zoster and/or pneumovax:  Immunization History  Administered Date(s) Administered   Fluad Quad(high Dose 65+) 10/18/2020, 10/19/2021   Influenza Whole 08/17/2010   Influenza, High Dose Seasonal PF 09/21/2016, 09/25/2017, 09/24/2018   Influenza,inj,Quad PF,6+ Mos 09/28/2013, 10/11/2014, 10/10/2015, 11/06/2019   Moderna Sars-Covid-2 Vaccination 02/15/2020, 03/14/2020, 11/08/2020   Pneumococcal Conjugate-13 10/11/2014   Pneumococcal Polysaccharide-23 06/16/2008   Td 02/15/2003, 09/20/2011   Tdap 09/17/2011   Zoster, Live 05/18/2007    ROS: Denies dizziness, LOC, polyuria, polydipsia, unintended weight loss/gain, foot ulcerations, numbness or tingling in extremities, shortness of breath or chest pain.    ROS: Per HPI  Allergies  Allergen Reactions   Hydrocodone-Acetaminophen Other (See Comments)    Makes pt. 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   RIGHT NASAL SIDEWALL- NO TX   BCC (basal cell carcinoma) 11/15/2004   LEFT CHEST- TX CURET X 3, 5FU   BCC (basal cell carcinoma) 11/15/2004   RIGHT EAR- TX CURET X 3, EXCISION   BCC (basal cell carcinoma) 08/13/2005   RIGHT EAR- TX MOHS   BCC (basal cell carcinoma) 04/01/2008   SUPERFICIAL- UPPER LEFT BACK- TX CURET X 3, 5FU   BCC (basal cell carcinoma) 12/23/2012   ULCERATED- ABOVE RIGHT LIP- TX MOHS   Cataract    Chronic kidney disease    Colon polyp    Coronary artery disease    (left main normal, LAD 25-30% stenosis, distal 30-40% stenosis, circumflex obtuse marginal 50% stenosis,  right coronary artery dominant with long 75% followed by mid 80%  stenosis followed by 90% stenosis at the ostium of the PDA.  He had  stenting of the PDA by Dr. [name].  This was a Cypher stent.   First angioplasty was 1994, Cath 2011 100% RCA ).       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   WELL DIFF- RIGHT TEMPLE-TX CURET X3,5FU   SCC (squamous cell carcinoma) 10/14/2019   WELL DIFF- RIGHT SHOULDER-TX CURET X 3, 5FU   SCC (squamous cell carcinoma) 01/24/2019   WELL DIFF- LEFT INNER CHEEK- MOHS   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   ABOVE LEFT OUTER BROW SUPERIOR- TX CURET X 3, 5FU   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   LEFT FOREHEAD-TX CURET X 3, 5FU   SCCA (squamous cell carcinoma) of skin  04/01/2008   RIGHT HAND- TX CURET X 3, 5FU   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 Put on hold, Disp: 90 tablet, Rfl: 3   aspirin 81 MG EC tablet, Take 81 mg by mouth daily., Disp: , Rfl:    blood glucose meter kit and supplies, Dispense based on patient and insurance preference. Use up to four times daily as directed. (FOR ICD-10 E11.21), Disp: 1 each, Rfl: 0   carvedilol (COREG) 12.5 MG tablet, Take 1 tablet (12.5 mg total) by mouth 2 (two) times daily with a meal., Disp: 180 tablet, Rfl: 3   Choline Fenofibrate (FENOFIBRIC ACID) 135 MG CPDR, Take 135 mg by mouth daily., Disp: 90 capsule, Rfl: 3   Dapagliflozin-sAXagliptin (QTERN) 10-5 MG TABS, Take 1 tablet by mouth in the morning., Disp: 90 tablet, Rfl: 11   enalapril (VASOTEC) 2.5 MG tablet, Take 1 tablet (2.5 mg total) by mouth daily., Disp: 90 tablet, Rfl: 3   esomeprazole (NEXIUM) 40 MG capsule, Take 1 capsule (40 mg total) by mouth daily. Put on hold, Disp: 90 capsule, Rfl: 3   ezetimibe (ZETIA) 10 MG tablet, Take 1 tablet (10 mg total) by mouth daily., Disp: 90 tablet, Rfl: 3   glucose blood test strip, UAD to test BGs up to 4 times daily E11.21, Disp: 100 each, Rfl: 12   isosorbide mononitrate (IMDUR) 60 MG 24 hr tablet, TAKE 1 TABLET DAILY, Disp: 90 tablet, Rfl: 2   Lancets MISC, UAD to test BGs up to 4 times daily E11.21, Disp: 100 each, Rfl: 12   latanoprost (XALATAN) 0.005 % ophthalmic  solution, Place 1 drop into both eyes at bedtime., Disp: , Rfl:    nitroGLYCERIN (NITROSTAT) 0.4 MG SL tablet, Place 1 tablet (0.4 mg total) under the tongue every 5 (five) minutes as needed., Disp: 25 tablet, Rfl: 4   rosuvastatin (CRESTOR) 20 MG tablet, Take 1 tablet (20 mg total) by mouth daily., Disp: 90 tablet, Rfl: 3   silodosin (RAPAFLO) 8 MG CAPS capsule, Take 1 capsule (8 mg total) by mouth daily with breakfast., Disp: 90 capsule, Rfl: 3   VASCEPA 1 g capsule, Take 2 capsules (2 g total) by mouth 2 (two) times daily., Disp: 360 capsule, Rfl: 3   Vitamin D,  Ergocalciferol, (DRISDOL) 1.25 MG (50000 UNIT) CAPS capsule, Take 1 capsule (50,000 Units total) by mouth every 7 (seven) days., Disp: 5 capsule, Rfl: 0 Social History   Socioeconomic History   Marital status: Married    Spouse name: Charlene   Number of children: 2   Years of education: Not on file   Highest education level: Not on file  Occupational History   Occupation: retired  Tobacco Use   Smoking status: Former    Packs/day: 2.00    Years: 25.00    Pack years: 50.00    Types: Cigarettes    Quit date: 08/14/1991    Years since quitting: 30.7    Passive exposure: Never   Smokeless tobacco: Never  Vaping Use   Vaping Use: Never used  Substance and Sexual Activity   Alcohol use: Yes    Comment: rare   Drug use: No   Sexual activity: Yes  Other Topics Concern   Not on file  Social History Narrative   Married x 60 years in 2022.   7 grandchildren.   1 great granddaughter.    Social Determinants of Health   Financial Resource Strain: Low Risk    Difficulty of Paying Living Expenses: Not hard at all  Food Insecurity: No Food Insecurity   Worried About Charity fundraiser in the Last Year: Never true   [place] in the Last Year: Never true  Transportation Needs: No Transportation Needs   Lack of Transportation (Medical): No   Lack of Transportation (Non-Medical): No  Physical Activity: Insufficiently  Active   Days of Exercise per Week: 3 days   Minutes of Exercise per Session: 20 min  Stress: No Stress Concern Present   Feeling of Stress : Not at all  Social Connections: Socially Integrated   Frequency of Communication with Friends and Family: More than three times a week   Frequency of Social Gatherings with Friends and Family: More than three times a week   Attends Religious Services: More than 4 times per year   Active Member of [employer] or Organizations: Yes   Attends Music therapist: More than 4 times per year   Marital Status: Married  Human resources officer Violence: Not At Risk   Fear of Current or Ex-Partner: No   Emotionally Abused: No   Physically Abused: No   Sexually Abused: No   Family History  Problem Relation Age of Onset   Cancer Mother        unsure of type   Glaucoma Mother    Heart disease Father    Coronary artery disease Father    Cancer Sister 88       granlocytosis (Wergerner's)   Colon cancer Brother 82   Nephritis Sister        20's   Cancer Brother        lung     Objective: Office vital signs reviewed. BP 119/65   Pulse 76   Temp (!) 97.2 F (36.2 C) (Temporal)   Resp 20   Ht _0  (1.702 m)   Wt 178 lb (80.7 kg)   SpO2 93%   BMI 27.88 kg/m   Physical Examination:  General: Awake, alert, well nourished, well-appearing elderly male.  No acute distress HEENT: Sclera white.  Moist mucous membranes Cardio: regular rate and rhythm, S1S2 heard, no murmurs appreciated Pulm: clear to auscultation bilaterally, no wheezes, rhonchi or rales; normal work of breathing on room air MSK: Slow but independent  gait and station  Assessment/ Plan: 81 y.o. male   Type 2 diabetes mellitus with diabetic nephropathy, without long-term current use of insulin ([place]) - Plan: glipiZIDE (GLUCOTROL) 5 MG tablet, Bayer DCA Hb A1c Waived  Hypertension associated with type 2 diabetes mellitus ([place])  Hyperlipidemia associated with type 2 diabetes  mellitus ([place])  Right wrist tendonitis  Sugar remains uncontrolled with A1c of 8.2 today.  We will add low-dose glipizide with breakfast.  May consider making this extended release if needed for longer coverage but would like to avoid any hypoglycemic episodes in this octogenarian.  Continue Qtern.  Continue to monitor blood sugars.  If we can get his A1c down to about 7.5 I think we would be in a good position.  Blood pressure is controlled.  No changes  We will await recommendations by cardiology given addition of Zetia  Tendinitis is improving.  No additional treatment necessary at this time  No orders of the defined types were placed in this encounter.  No orders of the defined types were placed in this encounter.    Janora Norlander, DO [place] [phone]

## 2022-06-05 LAB — HM DIABETES EYE EXAM

## 2022-06-08 NOTE — Progress Notes (Signed)
Histology and Location of Primary Skin Cancer:    Past/Anticipated interventions by patient's surgeon/dermatologist for current problematic lesion, if any:  05/24/2022 --Dr. Herma Mering Mohs to right zygomatic area  Past skin cancers, if any:    History of Blistering sunburns, if any: Significant history of sun exposure   SAFETY ISSUES: Prior radiation? No Pacemaker/ICD? No Possible current pregnancy? N/A Is the patient on methotrexate? No  Current Complaints / other details:  Nothing else of note

## 2022-06-11 ENCOUNTER — Other Ambulatory Visit: Payer: Self-pay

## 2022-06-11 ENCOUNTER — Encounter: Payer: Self-pay | Admitting: Radiation Oncology

## 2022-06-11 ENCOUNTER — Ambulatory Visit
Admission: RE | Admit: 2022-06-11 | Discharge: 2022-06-11 | Disposition: A | Discharge status: Home / Self Care | Payer: Medicare HMO | Source: Ambulatory Visit | Attending: Radiation Oncology | Admitting: Radiation Oncology

## 2022-06-11 ENCOUNTER — Other Ambulatory Visit: Payer: Self-pay | Admitting: Internal Medicine

## 2022-06-11 VITALS — BP 156/71 | HR 75 | Temp 97.7°F | Resp 18 | Ht 67.0 in | Wt 179.2 lb

## 2022-06-11 DIAGNOSIS — E119 Type 2 diabetes mellitus without complications: Secondary | ICD-10-CM | POA: Diagnosis not present

## 2022-06-11 DIAGNOSIS — N400 Enlarged prostate without lower urinary tract symptoms: Secondary | ICD-10-CM | POA: Insufficient documentation

## 2022-06-11 DIAGNOSIS — I129 Hypertensive chronic kidney disease with stage 1 through stage 4 chronic kidney disease, or unspecified chronic kidney disease: Secondary | ICD-10-CM | POA: Diagnosis not present

## 2022-06-11 DIAGNOSIS — Z809 Family history of malignant neoplasm, unspecified: Secondary | ICD-10-CM | POA: Insufficient documentation

## 2022-06-11 DIAGNOSIS — Z7982 Long term (current) use of aspirin: Secondary | ICD-10-CM | POA: Insufficient documentation

## 2022-06-11 DIAGNOSIS — E785 Hyperlipidemia, unspecified: Secondary | ICD-10-CM | POA: Diagnosis not present

## 2022-06-11 DIAGNOSIS — C44320 Squamous cell carcinoma of skin of unspecified parts of face: Secondary | ICD-10-CM | POA: Insufficient documentation

## 2022-06-11 DIAGNOSIS — Z923 Personal history of irradiation: Secondary | ICD-10-CM | POA: Diagnosis not present

## 2022-06-11 DIAGNOSIS — Z87891 Personal history of nicotine dependence: Secondary | ICD-10-CM | POA: Diagnosis not present

## 2022-06-11 DIAGNOSIS — N189 Chronic kidney disease, unspecified: Secondary | ICD-10-CM | POA: Diagnosis not present

## 2022-06-11 DIAGNOSIS — I2510 Atherosclerotic heart disease of native coronary artery without angina pectoris: Secondary | ICD-10-CM | POA: Insufficient documentation

## 2022-06-11 DIAGNOSIS — I252 Old myocardial infarction: Secondary | ICD-10-CM | POA: Insufficient documentation

## 2022-06-11 DIAGNOSIS — G4730 Sleep apnea, unspecified: Secondary | ICD-10-CM | POA: Insufficient documentation

## 2022-06-11 DIAGNOSIS — Z800 Family history of malignant neoplasm of digestive organs: Secondary | ICD-10-CM | POA: Diagnosis not present

## 2022-08-10 ENCOUNTER — Other Ambulatory Visit: Payer: Self-pay | Admitting: Radiation Oncology

## 2022-08-10 ENCOUNTER — Ambulatory Visit
Admission: RE | Admit: 2022-08-10 | Discharge: 2022-08-10 | Disposition: A | Discharge status: Home / Self Care | Payer: Medicare HMO | Source: Ambulatory Visit | Attending: Radiation Oncology | Admitting: Radiation Oncology

## 2022-08-10 ENCOUNTER — Other Ambulatory Visit: Payer: Self-pay

## 2022-08-10 DIAGNOSIS — C44320 Squamous cell carcinoma of skin of unspecified parts of face: Secondary | ICD-10-CM | POA: Insufficient documentation

## 2022-08-10 DIAGNOSIS — K118 Other diseases of salivary glands: Secondary | ICD-10-CM

## 2022-08-10 NOTE — Progress Notes (Signed)
Oncology Nurse Navigator Documentation   To provide support, encouragement and care continuity, met with Mr. Goynes during his CT SIM.  He tolerated procedure without difficulty, denied questions/concerns.   I encouraged him to contact me prior to 08/22/22 [place] if he had any questions.   Harlow Asa RN, BSN, OCN Head & Neck Oncology Nurse [place] at [facility] Phone # [phone]  Fax # [phone]

## 2022-08-10 NOTE — Progress Notes (Signed)
   Timothy [name] has a history of right cheek skin cancer, squamous cell histology, with significant perineural invasion.  When we performed a CT scan today to plan radiation therapy to his cheek adjuvantly, I noticed a nodule in his right parotid gland.  I shared the images with Dr. Pascal Lux.  Dr. Pascal Lux and I concur that the patient would benefit from an ultrasound-guided biopsy to rule out malignancy.  I left a voicemail for the patient explaining the above and encouraged him to call me with any questions. -----------------------------------  Eppie Gibson, MD

## 2022-08-13 NOTE — Progress Notes (Unsigned)
Sandi Mariscal, MD  Donita Brooks D; Clois Comber, MD OK for US guided R parotid LN Bx.  Sedation per pt request.   I had a long discussion with Dr. Isidore Moos re this pt.   R parotid nodule noted incidentally on planning CT for XRT for skin cancer of face.     Dr. Isidore Moos has done a neck CT as well for planning purposes and doesn't see any cervical LAN or a nodule within the contralateral L parotid gland.     To expedite this pt, I have agreed to US guided Bx of this nodule without our typical pre-biopsy diagnostic imaging.     Anderson Malta -> B/C it's an atypical request, can you fit him into the schedule this Wednesday when I'm at [facility]?   Cathren Harsh

## 2022-08-13 NOTE — Progress Notes (Unsigned)
Sandi Mariscal, MD  Donita Brooks D Have you received an order from Dr. Isidore Moos for an US guided Bx for this pt?   If so, please put it to my attention so I can approve.   Cathren Harsh

## 2022-08-14 ENCOUNTER — Other Ambulatory Visit: Payer: Self-pay | Admitting: Radiology

## 2022-08-14 DIAGNOSIS — R591 Generalized enlarged lymph nodes: Secondary | ICD-10-CM

## 2022-08-14 DIAGNOSIS — C44320 Squamous cell carcinoma of skin of unspecified parts of face: Secondary | ICD-10-CM | POA: Diagnosis not present

## 2022-08-15 ENCOUNTER — Ambulatory Visit (HOSPITAL_COMMUNITY)
Admission: RE | Admit: 2022-08-15 | Discharge: 2022-08-15 | Disposition: A | Discharge status: Home / Self Care | Payer: Medicare HMO | Source: Ambulatory Visit | Attending: Radiation Oncology | Admitting: Radiation Oncology

## 2022-08-15 ENCOUNTER — Other Ambulatory Visit: Payer: Self-pay | Admitting: Radiation Oncology

## 2022-08-15 ENCOUNTER — Other Ambulatory Visit: Payer: Self-pay

## 2022-08-15 ENCOUNTER — Ambulatory Visit: Payer: Medicare HMO | Admitting: Family Medicine

## 2022-08-15 DIAGNOSIS — Z85828 Personal history of other malignant neoplasm of skin: Secondary | ICD-10-CM | POA: Diagnosis present

## 2022-08-15 DIAGNOSIS — R591 Generalized enlarged lymph nodes: Secondary | ICD-10-CM

## 2022-08-15 DIAGNOSIS — K118 Other diseases of salivary glands: Secondary | ICD-10-CM

## 2022-08-15 DIAGNOSIS — C44320 Squamous cell carcinoma of skin of unspecified parts of face: Secondary | ICD-10-CM

## 2022-08-15 MED ORDER — SODIUM CHLORIDE 0.9 % IV SOLN: INTRAVENOUS | Status: DC

## 2022-08-15 MED ORDER — LIDOCAINE HCL (PF) 1 % IJ SOLN
INTRAMUSCULAR | Status: AC
  Filled 2022-08-15: qty 30

## 2022-08-15 NOTE — Progress Notes (Signed)
Interventional Radiology Brief Note:   Patient presents today for biopsy of parotid mass seen during preparation for radiation with Dr. Isidore Moos.  Patient presents today in his usual state of health.  There is a small palpable submandibular nodule on the right. He requests no sedation. Team aware.    Brynda Greathouse, MS RD PA-C 8:34 AM

## 2022-08-15 NOTE — Procedures (Signed)
Pre Procedure Dx: Right parotid nodule Post Procedural Dx: Same  Technically successful US guided biopsy of indeterminate right parotid gland nodule.  EBL: None  No immediate complications.   Ronny Bacon, MD Pager #: [phone]

## 2022-08-16 LAB — SURGICAL PATHOLOGY

## 2022-08-17 NOTE — Progress Notes (Signed)
Oncology Nurse Navigator Documentation   I spoke with Timothy [name] today regarding his recent biopsy results. He had seen them in MyChart but we discussed further that they appear to be benign. I explained that we would discuss them further in our ENT conference on 9/6. He knows to present for his scheduled radiation appointment on 9/6 at 1:55 unless I call him that morning to tell him otherwise. He also knows to call me if he has any questions or concerns at any time.   Harlow Asa RN, BSN, OCN Head & Neck Oncology Nurse [place] at [facility] Phone # [phone]  Fax # [phone]

## 2022-08-21 ENCOUNTER — Other Ambulatory Visit: Payer: Self-pay | Admitting: Family Medicine

## 2022-08-21 DIAGNOSIS — E1121 Type 2 diabetes mellitus with diabetic nephropathy: Secondary | ICD-10-CM

## 2022-08-22 ENCOUNTER — Other Ambulatory Visit: Payer: Self-pay

## 2022-08-22 ENCOUNTER — Ambulatory Visit
Admission: RE | Admit: 2022-08-22 | Discharge: 2022-08-22 | Disposition: A | Discharge status: Home / Self Care | Payer: Medicare HMO | Source: Ambulatory Visit | Attending: Radiation Oncology | Admitting: Radiation Oncology

## 2022-08-22 ENCOUNTER — Encounter: Payer: Self-pay | Admitting: Radiation Oncology

## 2022-08-22 DIAGNOSIS — C44320 Squamous cell carcinoma of skin of unspecified parts of face: Secondary | ICD-10-CM | POA: Insufficient documentation

## 2022-08-22 LAB — RAD ONC ARIA SESSION SUMMARY
Course Elapsed Days: 0
Plan Fractions Treated to Date: 1
Plan Prescribed Dose Per Fraction: 2.5 Gy
Plan Total Fractions Prescribed: 20
Plan Total Prescribed Dose: 50 Gy
Reference Point Dosage Given to Date: 2.5 Gy
Reference Point Session Dosage Given: 2.5 Gy
Session Number: 1

## 2022-08-22 NOTE — Progress Notes (Signed)
We reviewed the parotid biopsy results today at ENT tumor board.  Results are benign and on further review of imaging the patient was found to have a mass in his parotid gland of similar size per MRI of brain in 2014.  Consensus at tumor board is to proceed with radiation to skin of cheek as planned. -----------------------------------  Timothy Gibson, MD

## 2022-08-22 NOTE — Progress Notes (Signed)
Oncology Nurse Navigator Documentation   Mr. Moragne presented for his first radiation treatment today. He completed treatment without difficulty, denied questions/concerns. He knows to call me as treatment progresses if he has any questions.  Harlow Asa RN, BSN, OCN Head & Neck Oncology Nurse [place] at [facility] Phone # [phone]  Fax # [phone]

## 2022-08-23 ENCOUNTER — Other Ambulatory Visit: Payer: Self-pay

## 2022-08-23 ENCOUNTER — Ambulatory Visit
Admission: RE | Admit: 2022-08-23 | Discharge: 2022-08-23 | Disposition: A | Discharge status: Home / Self Care | Payer: Medicare HMO | Source: Ambulatory Visit | Attending: Radiation Oncology | Admitting: Radiation Oncology

## 2022-08-23 DIAGNOSIS — C44320 Squamous cell carcinoma of skin of unspecified parts of face: Secondary | ICD-10-CM | POA: Diagnosis not present

## 2022-08-23 LAB — RAD ONC ARIA SESSION SUMMARY
Course Elapsed Days: 1
Plan Fractions Treated to Date: 2
Plan Prescribed Dose Per Fraction: 2.5 Gy
Plan Total Fractions Prescribed: 20
Plan Total Prescribed Dose: 50 Gy
Reference Point Dosage Given to Date: 5 Gy
Reference Point Session Dosage Given: 2.5 Gy
Session Number: 2

## 2022-08-24 ENCOUNTER — Other Ambulatory Visit: Payer: Self-pay

## 2022-08-24 ENCOUNTER — Ambulatory Visit
Admission: RE | Admit: 2022-08-24 | Discharge: 2022-08-24 | Disposition: A | Discharge status: Home / Self Care | Payer: Medicare HMO | Source: Ambulatory Visit | Attending: Radiation Oncology | Admitting: Radiation Oncology

## 2022-08-24 DIAGNOSIS — C44320 Squamous cell carcinoma of skin of unspecified parts of face: Secondary | ICD-10-CM | POA: Diagnosis not present

## 2022-08-24 LAB — RAD ONC ARIA SESSION SUMMARY
Course Elapsed Days: 2
Plan Fractions Treated to Date: 3
Plan Prescribed Dose Per Fraction: 2.5 Gy
Plan Total Fractions Prescribed: 20
Plan Total Prescribed Dose: 50 Gy
Reference Point Dosage Given to Date: 7.5 Gy
Reference Point Session Dosage Given: 2.5 Gy
Session Number: 3

## 2022-08-27 ENCOUNTER — Other Ambulatory Visit: Payer: Self-pay

## 2022-08-27 ENCOUNTER — Ambulatory Visit
Admission: RE | Admit: 2022-08-27 | Discharge: 2022-08-27 | Disposition: A | Discharge status: Home / Self Care | Payer: Medicare HMO | Source: Ambulatory Visit | Attending: Radiation Oncology | Admitting: Radiation Oncology

## 2022-08-27 DIAGNOSIS — C44320 Squamous cell carcinoma of skin of unspecified parts of face: Secondary | ICD-10-CM | POA: Diagnosis not present

## 2022-08-27 LAB — RAD ONC ARIA SESSION SUMMARY
Course Elapsed Days: 5
Plan Fractions Treated to Date: 4
Plan Prescribed Dose Per Fraction: 2.5 Gy
Plan Total Fractions Prescribed: 20
Plan Total Prescribed Dose: 50 Gy
Reference Point Dosage Given to Date: 10 Gy
Reference Point Session Dosage Given: 2.5 Gy
Session Number: 4

## 2022-08-27 MED ORDER — SONAFINE EX EMUL: 1.0000 | Freq: Two times a day (BID) | CUTANEOUS | Status: DC

## 2022-08-27 NOTE — Progress Notes (Signed)
Pt here for patient teaching.  Pt given Radiation and You booklet, skin care instructions, and Sonafine.  Reviewed areas of pertinence such as fatigue, skin changes, and taste changes . Pt able to give teach back of to pat skin and use unscented/gentle soap,apply Sonafine bid and avoid applying anything to skin within 4 hours of treatment. Pt verbalizes understanding of information given and will contact nursing with any questions or concerns.     [email or web address]

## 2022-08-28 ENCOUNTER — Ambulatory Visit
Admission: RE | Admit: 2022-08-28 | Discharge: 2022-08-28 | Disposition: A | Discharge status: Home / Self Care | Payer: Medicare HMO | Source: Ambulatory Visit | Attending: Radiation Oncology | Admitting: Radiation Oncology

## 2022-08-28 ENCOUNTER — Other Ambulatory Visit: Payer: Self-pay

## 2022-08-28 DIAGNOSIS — C44320 Squamous cell carcinoma of skin of unspecified parts of face: Secondary | ICD-10-CM | POA: Diagnosis not present

## 2022-08-28 LAB — RAD ONC ARIA SESSION SUMMARY
Course Elapsed Days: 6
Plan Fractions Treated to Date: 5
Plan Prescribed Dose Per Fraction: 2.5 Gy
Plan Total Fractions Prescribed: 20
Plan Total Prescribed Dose: 50 Gy
Reference Point Dosage Given to Date: 12.5 Gy
Reference Point Session Dosage Given: 2.5 Gy
Session Number: 5

## 2022-08-29 ENCOUNTER — Other Ambulatory Visit: Payer: Self-pay

## 2022-08-29 ENCOUNTER — Ambulatory Visit
Admission: RE | Admit: 2022-08-29 | Discharge: 2022-08-29 | Disposition: A | Discharge status: Home / Self Care | Payer: Medicare HMO | Source: Ambulatory Visit | Attending: Radiation Oncology | Admitting: Radiation Oncology

## 2022-08-29 DIAGNOSIS — C44320 Squamous cell carcinoma of skin of unspecified parts of face: Secondary | ICD-10-CM | POA: Diagnosis not present

## 2022-08-29 LAB — RAD ONC ARIA SESSION SUMMARY
Course Elapsed Days: 7
Plan Fractions Treated to Date: 6
Plan Prescribed Dose Per Fraction: 2.5 Gy
Plan Total Fractions Prescribed: 20
Plan Total Prescribed Dose: 50 Gy
Reference Point Dosage Given to Date: 15 Gy
Reference Point Session Dosage Given: 2.5 Gy
Session Number: 6

## 2022-08-30 ENCOUNTER — Other Ambulatory Visit: Payer: Self-pay

## 2022-08-30 ENCOUNTER — Ambulatory Visit
Admission: RE | Admit: 2022-08-30 | Discharge: 2022-08-30 | Disposition: A | Discharge status: Home / Self Care | Payer: Medicare HMO | Source: Ambulatory Visit | Attending: Radiation Oncology | Admitting: Radiation Oncology

## 2022-08-30 DIAGNOSIS — C44320 Squamous cell carcinoma of skin of unspecified parts of face: Secondary | ICD-10-CM | POA: Diagnosis not present

## 2022-08-30 LAB — RAD ONC ARIA SESSION SUMMARY
Course Elapsed Days: 8
Plan Fractions Treated to Date: 7
Plan Prescribed Dose Per Fraction: 2.5 Gy
Plan Total Fractions Prescribed: 20
Plan Total Prescribed Dose: 50 Gy
Reference Point Dosage Given to Date: 17.5 Gy
Reference Point Session Dosage Given: 2.5 Gy
Session Number: 7

## 2022-08-31 ENCOUNTER — Ambulatory Visit
Admission: RE | Admit: 2022-08-31 | Discharge: 2022-08-31 | Disposition: A | Discharge status: Home / Self Care | Payer: Medicare HMO | Source: Ambulatory Visit | Attending: Radiation Oncology | Admitting: Radiation Oncology

## 2022-08-31 ENCOUNTER — Other Ambulatory Visit: Payer: Self-pay

## 2022-08-31 DIAGNOSIS — C44320 Squamous cell carcinoma of skin of unspecified parts of face: Secondary | ICD-10-CM | POA: Diagnosis not present

## 2022-08-31 LAB — RAD ONC ARIA SESSION SUMMARY
Course Elapsed Days: 9
Plan Fractions Treated to Date: 8
Plan Prescribed Dose Per Fraction: 2.5 Gy
Plan Total Fractions Prescribed: 20
Plan Total Prescribed Dose: 50 Gy
Reference Point Dosage Given to Date: 20 Gy
Reference Point Session Dosage Given: 2.5 Gy
Session Number: 8

## 2022-09-03 ENCOUNTER — Other Ambulatory Visit: Payer: Self-pay

## 2022-09-03 ENCOUNTER — Ambulatory Visit
Admission: RE | Admit: 2022-09-03 | Discharge: 2022-09-03 | Disposition: A | Discharge status: Home / Self Care | Payer: Medicare HMO | Source: Ambulatory Visit | Attending: Radiation Oncology | Admitting: Radiation Oncology

## 2022-09-03 ENCOUNTER — Ambulatory Visit: Payer: Medicare HMO

## 2022-09-03 DIAGNOSIS — C44320 Squamous cell carcinoma of skin of unspecified parts of face: Secondary | ICD-10-CM | POA: Diagnosis not present

## 2022-09-03 LAB — RAD ONC ARIA SESSION SUMMARY
Course Elapsed Days: 12
Plan Fractions Treated to Date: 9
Plan Prescribed Dose Per Fraction: 2.5 Gy
Plan Total Fractions Prescribed: 20
Plan Total Prescribed Dose: 50 Gy
Reference Point Dosage Given to Date: 22.5 Gy
Reference Point Session Dosage Given: 2.5 Gy
Session Number: 9

## 2022-09-04 ENCOUNTER — Ambulatory Visit
Admission: RE | Admit: 2022-09-04 | Discharge: 2022-09-04 | Disposition: A | Discharge status: Home / Self Care | Payer: Medicare HMO | Source: Ambulatory Visit | Attending: Radiation Oncology | Admitting: Radiation Oncology

## 2022-09-04 ENCOUNTER — Other Ambulatory Visit: Payer: Self-pay

## 2022-09-04 DIAGNOSIS — C44320 Squamous cell carcinoma of skin of unspecified parts of face: Secondary | ICD-10-CM | POA: Diagnosis not present

## 2022-09-04 LAB — RAD ONC ARIA SESSION SUMMARY
Course Elapsed Days: 13
Plan Fractions Treated to Date: 10
Plan Prescribed Dose Per Fraction: 2.5 Gy
Plan Total Fractions Prescribed: 20
Plan Total Prescribed Dose: 50 Gy
Reference Point Dosage Given to Date: 25 Gy
Reference Point Session Dosage Given: 2.5 Gy
Session Number: 10

## 2022-09-05 ENCOUNTER — Other Ambulatory Visit: Payer: Self-pay

## 2022-09-05 ENCOUNTER — Ambulatory Visit
Admission: RE | Admit: 2022-09-05 | Discharge: 2022-09-05 | Disposition: A | Discharge status: Home / Self Care | Payer: Medicare HMO | Source: Ambulatory Visit | Attending: Radiation Oncology | Admitting: Radiation Oncology

## 2022-09-05 DIAGNOSIS — C44320 Squamous cell carcinoma of skin of unspecified parts of face: Secondary | ICD-10-CM | POA: Diagnosis not present

## 2022-09-05 LAB — RAD ONC ARIA SESSION SUMMARY
Course Elapsed Days: 14
Plan Fractions Treated to Date: 11
Plan Prescribed Dose Per Fraction: 2.5 Gy
Plan Total Fractions Prescribed: 20
Plan Total Prescribed Dose: 50 Gy
Reference Point Dosage Given to Date: 27.5 Gy
Reference Point Session Dosage Given: 2.5 Gy
Session Number: 11

## 2022-09-06 ENCOUNTER — Other Ambulatory Visit: Payer: Self-pay

## 2022-09-06 ENCOUNTER — Ambulatory Visit: Payer: Medicare HMO | Admitting: Urology

## 2022-09-06 ENCOUNTER — Ambulatory Visit
Admission: RE | Admit: 2022-09-06 | Discharge: 2022-09-06 | Disposition: A | Discharge status: Home / Self Care | Payer: Medicare HMO | Source: Ambulatory Visit | Attending: Radiation Oncology | Admitting: Radiation Oncology

## 2022-09-06 DIAGNOSIS — C44320 Squamous cell carcinoma of skin of unspecified parts of face: Secondary | ICD-10-CM | POA: Diagnosis not present

## 2022-09-06 LAB — RAD ONC ARIA SESSION SUMMARY
Course Elapsed Days: 15
Plan Fractions Treated to Date: 12
Plan Prescribed Dose Per Fraction: 2.5 Gy
Plan Total Fractions Prescribed: 20
Plan Total Prescribed Dose: 50 Gy
Reference Point Dosage Given to Date: 30 Gy
Reference Point Session Dosage Given: 2.5 Gy
Session Number: 12

## 2022-09-07 ENCOUNTER — Other Ambulatory Visit: Payer: Self-pay

## 2022-09-07 ENCOUNTER — Ambulatory Visit
Admission: RE | Admit: 2022-09-07 | Discharge: 2022-09-07 | Disposition: A | Discharge status: Home / Self Care | Payer: Medicare HMO | Source: Ambulatory Visit | Attending: Radiation Oncology | Admitting: Radiation Oncology

## 2022-09-07 DIAGNOSIS — C44320 Squamous cell carcinoma of skin of unspecified parts of face: Secondary | ICD-10-CM | POA: Diagnosis not present

## 2022-09-07 LAB — RAD ONC ARIA SESSION SUMMARY
Course Elapsed Days: 16
Plan Fractions Treated to Date: 13
Plan Prescribed Dose Per Fraction: 2.5 Gy
Plan Total Fractions Prescribed: 20
Plan Total Prescribed Dose: 50 Gy
Reference Point Dosage Given to Date: 32.5 Gy
Reference Point Session Dosage Given: 2.5 Gy
Session Number: 13

## 2022-09-10 ENCOUNTER — Ambulatory Visit
Admission: RE | Admit: 2022-09-10 | Discharge: 2022-09-10 | Disposition: A | Discharge status: Home / Self Care | Payer: Medicare HMO | Source: Ambulatory Visit | Attending: Radiation Oncology | Admitting: Radiation Oncology

## 2022-09-10 ENCOUNTER — Other Ambulatory Visit: Payer: Self-pay

## 2022-09-10 ENCOUNTER — Ambulatory Visit: Payer: Medicare HMO

## 2022-09-10 DIAGNOSIS — C44320 Squamous cell carcinoma of skin of unspecified parts of face: Secondary | ICD-10-CM | POA: Diagnosis not present

## 2022-09-10 LAB — RAD ONC ARIA SESSION SUMMARY
Course Elapsed Days: 19
Plan Fractions Treated to Date: 14
Plan Prescribed Dose Per Fraction: 2.5 Gy
Plan Total Fractions Prescribed: 20
Plan Total Prescribed Dose: 50 Gy
Reference Point Dosage Given to Date: 35 Gy
Reference Point Session Dosage Given: 2.5 Gy
Session Number: 14

## 2022-09-11 ENCOUNTER — Ambulatory Visit
Admission: RE | Admit: 2022-09-11 | Discharge: 2022-09-11 | Disposition: A | Discharge status: Home / Self Care | Payer: Medicare HMO | Source: Ambulatory Visit | Attending: Radiation Oncology | Admitting: Radiation Oncology

## 2022-09-11 ENCOUNTER — Other Ambulatory Visit: Payer: Self-pay

## 2022-09-11 DIAGNOSIS — C44320 Squamous cell carcinoma of skin of unspecified parts of face: Secondary | ICD-10-CM | POA: Diagnosis not present

## 2022-09-11 LAB — RAD ONC ARIA SESSION SUMMARY
Course Elapsed Days: 20
Plan Fractions Treated to Date: 15
Plan Prescribed Dose Per Fraction: 2.5 Gy
Plan Total Fractions Prescribed: 20
Plan Total Prescribed Dose: 50 Gy
Reference Point Dosage Given to Date: 37.5 Gy
Reference Point Session Dosage Given: 2.5 Gy
Session Number: 15

## 2022-09-12 ENCOUNTER — Ambulatory Visit
Admission: RE | Admit: 2022-09-12 | Discharge: 2022-09-12 | Disposition: A | Discharge status: Home / Self Care | Payer: Medicare HMO | Source: Ambulatory Visit | Attending: Radiation Oncology | Admitting: Radiation Oncology

## 2022-09-12 ENCOUNTER — Telehealth: Payer: Self-pay | Admitting: Family Medicine

## 2022-09-12 ENCOUNTER — Other Ambulatory Visit: Payer: Self-pay

## 2022-09-12 DIAGNOSIS — E1121 Type 2 diabetes mellitus with diabetic nephropathy: Secondary | ICD-10-CM

## 2022-09-12 DIAGNOSIS — C44320 Squamous cell carcinoma of skin of unspecified parts of face: Secondary | ICD-10-CM | POA: Diagnosis not present

## 2022-09-12 DIAGNOSIS — N1831 Chronic kidney disease, stage 3a: Secondary | ICD-10-CM

## 2022-09-12 LAB — RAD ONC ARIA SESSION SUMMARY
Course Elapsed Days: 21
Plan Fractions Treated to Date: 16
Plan Prescribed Dose Per Fraction: 2.5 Gy
Plan Total Fractions Prescribed: 20
Plan Total Prescribed Dose: 50 Gy
Reference Point Dosage Given to Date: 40 Gy
Reference Point Session Dosage Given: 2.5 Gy
Session Number: 16

## 2022-09-12 NOTE — Telephone Encounter (Signed)
Pt aware.

## 2022-09-12 NOTE — Telephone Encounter (Signed)
Vit d, A1c, renal function panel and lipid. Make sure he fasts and CC Dr [name] on that lipid

## 2022-09-12 NOTE — Addendum Note (Signed)
Addended by: Everlean Cherry on: 09/12/2022 04:48 PM   Modules accepted: Orders

## 2022-09-13 ENCOUNTER — Other Ambulatory Visit: Payer: Medicare HMO

## 2022-09-13 ENCOUNTER — Ambulatory Visit
Admission: RE | Admit: 2022-09-13 | Discharge: 2022-09-13 | Disposition: A | Discharge status: Home / Self Care | Payer: Medicare HMO | Source: Ambulatory Visit | Attending: Radiation Oncology | Admitting: Radiation Oncology

## 2022-09-13 ENCOUNTER — Other Ambulatory Visit: Payer: Self-pay

## 2022-09-13 DIAGNOSIS — N1831 Chronic kidney disease, stage 3a: Secondary | ICD-10-CM

## 2022-09-13 DIAGNOSIS — E1121 Type 2 diabetes mellitus with diabetic nephropathy: Secondary | ICD-10-CM

## 2022-09-13 DIAGNOSIS — C44320 Squamous cell carcinoma of skin of unspecified parts of face: Secondary | ICD-10-CM | POA: Diagnosis not present

## 2022-09-13 LAB — RAD ONC ARIA SESSION SUMMARY
Course Elapsed Days: 22
Plan Fractions Treated to Date: 17
Plan Prescribed Dose Per Fraction: 2.5 Gy
Plan Total Fractions Prescribed: 20
Plan Total Prescribed Dose: 50 Gy
Reference Point Dosage Given to Date: 42.5 Gy
Reference Point Session Dosage Given: 2.5 Gy
Session Number: 17

## 2022-09-13 LAB — BAYER DCA HB A1C WAIVED: HB A1C (BAYER DCA - WAIVED): 6.5 % — ABNORMAL HIGH (ref 4.8–5.6)

## 2022-09-14 ENCOUNTER — Ambulatory Visit (INDEPENDENT_AMBULATORY_CARE_PROVIDER_SITE_OTHER): Payer: Medicare HMO | Admitting: Family Medicine

## 2022-09-14 ENCOUNTER — Ambulatory Visit
Admission: RE | Admit: 2022-09-14 | Discharge: 2022-09-14 | Disposition: A | Discharge status: Home / Self Care | Payer: Medicare HMO | Source: Ambulatory Visit | Attending: Radiation Oncology | Admitting: Radiation Oncology

## 2022-09-14 ENCOUNTER — Other Ambulatory Visit: Payer: Self-pay

## 2022-09-14 ENCOUNTER — Encounter: Payer: Self-pay | Admitting: Family Medicine

## 2022-09-14 VITALS — BP 130/61 | HR 76 | Temp 97.4°F | Ht 68.0 in | Wt 185.4 lb

## 2022-09-14 DIAGNOSIS — E1169 Type 2 diabetes mellitus with other specified complication: Secondary | ICD-10-CM

## 2022-09-14 DIAGNOSIS — E1121 Type 2 diabetes mellitus with diabetic nephropathy: Secondary | ICD-10-CM | POA: Diagnosis not present

## 2022-09-14 DIAGNOSIS — E1159 Type 2 diabetes mellitus with other circulatory complications: Secondary | ICD-10-CM

## 2022-09-14 DIAGNOSIS — C44320 Squamous cell carcinoma of skin of unspecified parts of face: Secondary | ICD-10-CM | POA: Diagnosis not present

## 2022-09-14 DIAGNOSIS — Z23 Encounter for immunization: Secondary | ICD-10-CM | POA: Diagnosis not present

## 2022-09-14 DIAGNOSIS — E785 Hyperlipidemia, unspecified: Secondary | ICD-10-CM

## 2022-09-14 DIAGNOSIS — N1831 Chronic kidney disease, stage 3a: Secondary | ICD-10-CM | POA: Diagnosis not present

## 2022-09-14 DIAGNOSIS — I152 Hypertension secondary to endocrine disorders: Secondary | ICD-10-CM

## 2022-09-14 LAB — RENAL FUNCTION PANEL
Albumin: 4.1 g/dL (ref 3.7–4.7)
BUN/Creatinine Ratio: 15 (ref 10–24)
BUN: 21 mg/dL (ref 8–27)
CO2: 19 mmol/L — ABNORMAL LOW (ref 20–29)
Calcium: 9.1 mg/dL (ref 8.6–10.2)
Chloride: 105 mmol/L (ref 96–106)
Creatinine, Ser: 1.39 mg/dL — ABNORMAL HIGH (ref 0.76–1.27)
Glucose: 161 mg/dL — ABNORMAL HIGH (ref 70–99)
Phosphorus: 3.3 mg/dL (ref 2.8–4.1)
Potassium: 4.5 mmol/L (ref 3.5–5.2)
Sodium: 141 mmol/L (ref 134–144)
eGFR: 51 mL/min/{1.73_m2} — ABNORMAL LOW (ref >59)

## 2022-09-14 LAB — LIPID PANEL
Chol/HDL Ratio: 5.1 ratio — ABNORMAL HIGH (ref 0.0–5.0)
Cholesterol, Total: 122 mg/dL (ref 100–199)
HDL: 24 mg/dL — ABNORMAL LOW (ref >39)
LDL Chol Calc (NIH): 60 mg/dL (ref 0–99)
Triglycerides: 235 mg/dL — ABNORMAL HIGH (ref 0–149)
VLDL Cholesterol Cal: 38 mg/dL (ref 5–40)

## 2022-09-14 LAB — VITAMIN D 25 HYDROXY (VIT D DEFICIENCY, FRACTURES): Vit D, 25-Hydroxy: 22.3 ng/mL — ABNORMAL LOW (ref 30.0–100.0)

## 2022-09-14 LAB — RAD ONC ARIA SESSION SUMMARY
Course Elapsed Days: 23
Plan Fractions Treated to Date: 18
Plan Prescribed Dose Per Fraction: 2.5 Gy
Plan Total Fractions Prescribed: 20
Plan Total Prescribed Dose: 50 Gy
Reference Point Dosage Given to Date: 45 Gy
Reference Point Session Dosage Given: 2.5 Gy
Session Number: 18

## 2022-09-14 NOTE — Progress Notes (Signed)
Subjective: CC:DM PCP: Janora Norlander, DO Timothy [name] is a 81 y.o. male presenting to clinic today for:  1. Type 2 Diabetes with hypertension, hyperlipidemia:  He reports compliance with all medications.  He apparently did have an elevated blood pressure reading at one of his specialist office where his systolic blood pressure was over 180 but he notes that he really has not seen any of these types of blood pressures at home.  No headaches, chest pain, shortness of breath reported.  He has 2 more radiation treatments for the cancer that he had on the right side of the face.  Last eye exam: UTD Last foot exam: UTD Last A1c:  Lab Results  Component Value Date   HGBA1C 6.5 (H) 09/13/2022   Nephropathy screen indicated?: on ACEi Last flu, zoster and/or pneumovax: Flu vaccine Immunization History  Administered Date(s) Administered   Fluad Quad(high Dose 65+) 10/18/2020, 10/19/2021, 09/14/2022   Influenza Whole 08/17/2010   Influenza, High Dose Seasonal PF 09/21/2016, 09/25/2017, 09/24/2018   Influenza,inj,Quad PF,6+ Mos 09/28/2013, 10/11/2014, 10/10/2015, 11/06/2019   Moderna Sars-Covid-2 Vaccination 02/15/2020, 03/14/2020, 11/08/2020   Pneumococcal Conjugate-13 10/11/2014   Pneumococcal Polysaccharide-23 06/16/2008   Td 02/15/2003, 09/20/2011   Tdap 09/17/2011   Zoster, Live 05/18/2007     ROS: Per HPI  Allergies  Allergen Reactions   Hydrocodone-Acetaminophen Other (See Comments)    Makes pt. 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   RIGHT NASAL SIDEWALL- NO TX   BCC (basal cell carcinoma) 11/15/2004   LEFT CHEST- TX CURET X 3, 5FU   BCC (basal cell carcinoma) 11/15/2004   RIGHT EAR- TX CURET X 3, EXCISION   BCC (basal cell carcinoma) 08/13/2005   RIGHT EAR- TX MOHS   BCC (basal cell  carcinoma) 04/01/2008   SUPERFICIAL- UPPER LEFT BACK- TX CURET X 3, 5FU   BCC (basal cell carcinoma) 12/23/2012   ULCERATED- ABOVE RIGHT LIP- TX MOHS   Cataract    Chronic kidney disease    Colon polyp    Coronary artery disease    (left main normal, LAD 25-30% stenosis, distal 30-40% stenosis, circumflex obtuse marginal 50% stenosis,  right coronary artery dominant with long 75% followed by mid 80%  stenosis followed by 90% stenosis at the ostium of the PDA.  He had  stenting of the PDA by Dr. [name].  This was a Cypher stent.   First angioplasty was 1994, Cath 2011 100% RCA ).       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    Melanoma ([place]) 12/23/2012   IN SITU- LEFT SIDEBURN-TX MOHS   Melanoma ([place]) 02/27/2017   LEFT CHEEK- TX MOHS   Myocardial infarction ([place])    per pt/ had mild heart attacks/has 3 stents   OSA (obstructive sleep apnea) 08/17/2016   SCC (squamous cell carcinoma) 11/16/2015   LEFT EAR RIM- TX EXCISION   SCC (squamous cell carcinoma) 02/27/2017   WELL DIFF- RIGHT TEMPLE-TX CURET X3,5FU   SCC (squamous cell carcinoma) 10/14/2019   WELL DIFF- RIGHT SHOULDER-TX CURET X 3, 5FU   SCC (squamous cell carcinoma) 01/24/2019   WELL DIFF-  LEFT INNER CHEEK- MOHS   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   ABOVE LEFT OUTER BROW SUPERIOR- TX CURET X 3, 5FU   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   LEFT FOREHEAD-TX CURET X 3, 5FU   SCCA (squamous cell carcinoma) of skin 04/01/2008   RIGHT HAND- TX CURET X 3, 5FU   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   LEFT SIDE OF FACE- TX CURET AFTER BIOPSY   SCCA (squamous cell carcinoma) of skin 01/12/2021   Neck - anterior (in situ)   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/27/2022   Left Zygomatic Area (in situ) (tx p bx)   SCCA (squamous  cell carcinoma) of skin 02/27/2022   Right Zygomatic Area (well diff)   Sleep apnea    no c-pap   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 Put on hold, Disp: 90 tablet, Rfl: 3   aspirin 81 MG EC tablet, Take 81 mg by mouth daily., Disp: , Rfl:    blood glucose meter kit and supplies, Dispense based on patient and insurance preference. Use up to four times daily as directed. (FOR ICD-10 E11.21), Disp: 1 each, Rfl: 0   carvedilol (COREG) 12.5 MG tablet, Take 1 tablet (12.5 mg total) by mouth 2 (two) times daily with a meal., Disp: 180 tablet, Rfl: 3   Choline Fenofibrate (FENOFIBRIC ACID) 135 MG CPDR, Take 135 mg by mouth daily., Disp: 90 capsule, Rfl: 3   Dapagliflozin-sAXagliptin (QTERN) 10-5 MG TABS, Take 1 tablet by mouth in the morning., Disp: 90 tablet, Rfl: 11   enalapril (VASOTEC) 2.5 MG tablet, Take 1 tablet (2.5 mg total) by mouth daily., Disp: 90 tablet, Rfl: 3   esomeprazole (NEXIUM) 40 MG capsule, Take 1 capsule (40 mg total) by mouth daily. Put on hold, Disp: 90 capsule, Rfl: 3   ezetimibe (ZETIA) 10 MG tablet, Take 1 tablet (10 mg total) by mouth daily., Disp: 90 tablet, Rfl: 3   glipiZIDE (GLUCOTROL) 5 MG tablet, Take 1 tablet (5 mg total) by mouth daily before breakfast. For diabetes., Disp: 90 tablet, Rfl: 1   isosorbide mononitrate (IMDUR) 60 MG 24 hr tablet, TAKE 1 TABLET DAILY, Disp: 90 tablet, Rfl: 2   Lancets MISC, UAD to test BGs up to 4 times daily E11.21, Disp: 100 each, Rfl: 12   latanoprost (XALATAN) 0.005 % ophthalmic solution, Place 1 drop into both eyes at bedtime., Disp: , Rfl:    nitroGLYCERIN (NITROSTAT) 0.4 MG SL tablet, Place 1 tablet (0.4 mg total) under the tongue every 5 (five) minutes as needed., Disp: 25 tablet, Rfl: 4   ONETOUCH VERIO test strip, CHECK BLOOD SUGAR UP TO FOUR TIMES DAILY, Disp: 100  strip, Rfl: 8   rosuvastatin (CRESTOR) 20 MG tablet, Take 1 tablet (20 mg total) by mouth daily., Disp: 90 tablet, Rfl: 3   silodosin (RAPAFLO) 8 MG CAPS capsule, Take 1 capsule (8 mg total) by mouth daily with breakfast., Disp: 90 capsule, Rfl: 3   VASCEPA 1 g capsule, Take 2 capsules (2 g total) by mouth 2 (two) times daily., Disp: 360 capsule, Rfl: 3   Vitamin D, Ergocalciferol, (DRISDOL) 1.25 MG (50000 UNIT) CAPS capsule, Take 1 capsule (50,000 Units total) by mouth every 7 (seven) days., Disp: 5 capsule, Rfl: 0 Social History  Socioeconomic History   Marital status: Married    Spouse name: Charlene   Number of children: 2   Years of education: Not on file   Highest education level: Not on file  Occupational History   Occupation: retired  Tobacco Use   Smoking status: Former    Packs/day: 2.00    Years: 25.00    Total pack years: 50.00    Types: Cigarettes    Quit date: 08/14/1991    Years since quitting: 31.1    Passive exposure: Never   Smokeless tobacco: Never  Vaping Use   Vaping Use: Never used  Substance and Sexual Activity   Alcohol use: Not Currently    Comment: rare   Drug use: No   Sexual activity: Not Currently  Other Topics Concern   Not on file  Social History Narrative   Married x 60 years in 2022.   7 grandchildren.   1 great granddaughter.    Social Determinants of Health   Financial Resource Strain: Low Risk  (11/27/2021)   Overall Financial Resource Strain (CARDIA)    Difficulty of Paying Living Expenses: Not hard at all  Food Insecurity: No Food Insecurity (11/27/2021)   Hunger Vital Sign    Worried About Running Out of Food in the Last Year: Never true    Ran Out of Food in the Last Year: Never true  Transportation Needs: No Transportation Needs (11/27/2021)   PRAPARE - Hydrologist (Medical): No    Lack of Transportation (Non-Medical): No  Physical Activity: Insufficiently Active (11/27/2021)   Exercise Vital  Sign    Days of Exercise per Week: 3 days    Minutes of Exercise per Session: 20 min  Stress: No Stress Concern Present (11/27/2021)   [place]    Feeling of Stress : Not at all  Social Connections: [place] (11/27/2021)   Social Connection and Isolation Panel [NHANES]    Frequency of Communication with Friends and Family: More than three times a week    Frequency of Social Gatherings with Friends and Family: More than three times a week    Attends Religious Services: More than 4 times per year    Active Member of [employer] or Organizations: Yes    Attends Music therapist: More than 4 times per year    Marital Status: Married  Human resources officer Violence: Not At Risk (11/27/2021)   Humiliation, Afraid, Rape, and Kick questionnaire    Fear of Current or Ex-Partner: No    Emotionally Abused: No    Physically Abused: No    Sexually Abused: No   Family History  Problem Relation Age of Onset   Cancer Mother        unsure of type   Glaucoma Mother    Heart disease Father    Coronary artery disease Father    Cancer Sister 20       granlocytosis ([place]gerner's)   Colon cancer Brother 46   Nephritis Sister        20's   Cancer Brother        lung     Objective: Office vital signs reviewed. BP 130/61   Pulse 76   Temp (!) 97.4 F (36.3 C)   Ht 5' 8" (1.727 m)   Wt 185 lb 6.4 oz (84.1 kg)   SpO2 92%   BMI 28.19 kg/m   Physical Examination:  General: Awake, alert, well nourished,  No acute distress HEENT: He has some contracture and postsurgical deformities noted on the right temporal area.  Slight drooping of the right eyelid. Cardio: regular rate and rhythm, S1S2 heard, no murmurs appreciated Pulm: clear to auscultation bilaterally, no wheezes, rhonchi or rales; normal work of breathing on room air MSK: Ambulating independently  Assessment/ Plan: 81 y.o. male   Type 2 diabetes mellitus  with diabetic nephropathy, without long-term current use of insulin (HCC)  Stage 3a chronic kidney disease ([place])  Hypertension associated with type 2 diabetes mellitus ([place])  Hyperlipidemia associated with type 2 diabetes mellitus ([place])  Need for immunization against influenza - Plan: Flu Vaccine QUAD High Dose(Fluad)  Sugar is under excellent control now with A1c down to 6.5.  No changes in medications.  We will CC Almyra Free as apparently his Vergia Alcon patient assistance will be ending this year.  May need to consider separating the medications out if that is more financially feasible for the patient and/or allows him to continue some type of patient assistance  Renal function is chronic and stable  Continue current dose of cholesterol meds  Blood pressure well controlled here so no changes  Influenza vaccination administered  Given controlled blood sugar, may follow-up in 4 months  Orders Placed This Encounter  Procedures   Flu Vaccine QUAD High Dose(Fluad)   No orders of the defined types were placed in this encounter.    Janora Norlander, DO [place] [phone]

## 2022-09-14 NOTE — Patient Instructions (Signed)
Sugar looks great!!!

## 2022-09-17 ENCOUNTER — Other Ambulatory Visit: Payer: Self-pay

## 2022-09-17 ENCOUNTER — Ambulatory Visit: Payer: Medicare HMO

## 2022-09-17 ENCOUNTER — Ambulatory Visit
Admission: RE | Admit: 2022-09-17 | Discharge: 2022-09-17 | Disposition: A | Discharge status: Home / Self Care | Payer: Medicare HMO | Source: Ambulatory Visit | Attending: Radiation Oncology | Admitting: Radiation Oncology

## 2022-09-17 DIAGNOSIS — C44320 Squamous cell carcinoma of skin of unspecified parts of face: Secondary | ICD-10-CM | POA: Diagnosis present

## 2022-09-17 LAB — RAD ONC ARIA SESSION SUMMARY
Course Elapsed Days: 26
Plan Fractions Treated to Date: 19
Plan Prescribed Dose Per Fraction: 2.5 Gy
Plan Total Fractions Prescribed: 20
Plan Total Prescribed Dose: 50 Gy
Reference Point Dosage Given to Date: 47.5 Gy
Reference Point Session Dosage Given: 2.5 Gy
Session Number: 19

## 2022-09-18 ENCOUNTER — Other Ambulatory Visit: Payer: Self-pay

## 2022-09-18 ENCOUNTER — Ambulatory Visit
Admission: RE | Admit: 2022-09-18 | Discharge: 2022-09-18 | Disposition: A | Discharge status: Home / Self Care | Payer: Medicare HMO | Source: Ambulatory Visit | Attending: Radiation Oncology | Admitting: Radiation Oncology

## 2022-09-18 ENCOUNTER — Encounter: Payer: Self-pay | Admitting: Radiation Oncology

## 2022-09-18 DIAGNOSIS — C44320 Squamous cell carcinoma of skin of unspecified parts of face: Secondary | ICD-10-CM | POA: Diagnosis not present

## 2022-09-18 LAB — RAD ONC ARIA SESSION SUMMARY
Course Elapsed Days: 27
Plan Fractions Treated to Date: 20
Plan Prescribed Dose Per Fraction: 2.5 Gy
Plan Total Fractions Prescribed: 20
Plan Total Prescribed Dose: 50 Gy
Reference Point Dosage Given to Date: 50 Gy
Reference Point Session Dosage Given: 2.5 Gy
Session Number: 20

## 2022-09-24 NOTE — Progress Notes (Signed)
                                                                                                                                                             Patient Name: Timothy [name] MRN: [id number] DOB: 14-Aug-1941 Referring Physician: Janann August (Profile Not Attached) Date of Service: 09/18/2022 [facility], [place]                                                        End Of Treatment Note  Diagnoses: C44.320-Squamous cell carcinoma of skin of unspecified parts of face  Cancer Staging:  Cancer Staging  Squamous cell carcinoma, face Staging form: Cutaneous Carcinoma of the Head and Neck, AJCC 8th Edition - Pathologic stage from 06/11/2022: Stage III (pT3, pN0, cM0) - Signed by Eppie Gibson, MD on 06/11/2022 Stage prefix: Initial diagnosis Extraosseous extension: Absent   Intent: Curative  Radiation Treatment Dates: 08/22/2022 through 09/18/2022 Site Technique Total Dose (Gy) Dose per Fx (Gy) Completed Fx Beam Energies  Cheek, Right: HN_R_Cheek specialPort 50/50 2.5 20/20 6E, 9E   Narrative: The patient tolerated radiation therapy relatively well with some moist desquamation in the treatment field.   Plan: The patient will follow-up with radiation oncology in 18mo -----------------------------------  SEppie Gibson MD

## 2022-10-17 NOTE — Progress Notes (Addendum)
Timothy [name] is here for follow up for radiation treatment of cancer of his face. His last treatment was on 09-18-22.  Pain issues, if any: no Weight changes, if any: none Wt Readings from Last 3 Encounters:  10/19/22 185 lb 4 oz (84 kg)  09/14/22 185 lb 6.4 oz (84.1 kg)  08/15/22 187 lb (84.8 kg)    Swallowing issues, if any: no, did have but improved Smoking or chewing tobacco? 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    Vitals:   10/19/22 1126  BP: (!) 144/65  Pulse: 72  Resp: 18  Temp: 97.9 F (36.6 C)  SpO2: 97%

## 2022-10-18 ENCOUNTER — Ambulatory Visit: Payer: Medicare HMO | Admitting: Urology

## 2022-10-18 ENCOUNTER — Encounter: Payer: Self-pay | Admitting: Urology

## 2022-10-18 VITALS — BP 150/60 | HR 76

## 2022-10-18 DIAGNOSIS — R339 Retention of urine, unspecified: Secondary | ICD-10-CM

## 2022-10-18 DIAGNOSIS — N3941 Urge incontinence: Secondary | ICD-10-CM

## 2022-10-18 DIAGNOSIS — N138 Other obstructive and reflux uropathy: Secondary | ICD-10-CM

## 2022-10-18 DIAGNOSIS — N401 Enlarged prostate with lower urinary tract symptoms: Secondary | ICD-10-CM

## 2022-10-18 DIAGNOSIS — N3281 Overactive bladder: Secondary | ICD-10-CM

## 2022-10-18 LAB — URINALYSIS, ROUTINE W REFLEX MICROSCOPIC
Bilirubin, UA: NEGATIVE
Ketones, UA: NEGATIVE
Leukocytes,UA: NEGATIVE
Nitrite, UA: NEGATIVE
Protein,UA: NEGATIVE
RBC, UA: NEGATIVE
Specific Gravity, UA: 1.01 (ref 1.005–1.030)
Urobilinogen, Ur: 0.2 mg/dL (ref 0.2–1.0)
pH, UA: 5 (ref 5.0–7.5)

## 2022-10-18 LAB — MICROSCOPIC EXAMINATION: RBC, Urine: NONE SEEN /hpf (ref 0–2)

## 2022-10-18 MED ORDER — TAMSULOSIN HCL 0.4 MG PO CAPS: 0.4000 mg | ORAL_CAPSULE | Freq: Every day | ORAL | 11 refills | Status: DC

## 2022-10-18 NOTE — Progress Notes (Signed)
Subjective: 1. BPH with urinary obstruction   2. Incomplete bladder emptying   3. Urge incontinence   4. Detrusor instability    10/18/22: Timothy [name] returns today in f/u for his history of BPH with BOO and OAB wet.  He has been on silodosin.  He has a history of a fossa stricture that was dilated at cystoscopy in 11/21.  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  He had some increased voiding symptoms while getting the RTx.   His IPSS is 14 with frequency and urgency but nocturia only 1x.   He has occasion incontinence but it is insensible so he wears a single depend a day.  His PVR is 23m.   His UA is clear but he has 3+ glucose.    08/31/21: WEdinreturns today in f/u.   He remains on silodosin.  His IPSS is 11 with urgency and frequency.  His PVR is 163m  He has had no hematuria or dysuria.   12/29/20: WiPraneetheturns today in f/u to assess his response the silodosin.  He is voiding better with reduce incontinence and has nocturia x 1.  His PVR today is 3475m He reports a good stream most of the time since the dilation of the fossa stricture.  His UA is unremarkable today.   His IPSS is 20, but his history is not consistent with the score.  He feels he empties well.  His daytime frequency is more often than every 2 hours.  He has still has some urgency.    10/28/20: [name] today in f/u from urodynamics for consideration of cystoscopy.   He continues to have significant LUTS on tamsulosin.  UA is clear today.  His IPSS is 19.  UDS results:  He had a Max capacity of 450m51mth a first sensation at 172ml29m a strong urge at 300ml 66m instability.   He generated a detrusor contraction but couldn't void with the tube in place.  He voided 317ml w38ma PF of 17ml/se62md a PVR of 134ml aft50mhe line was removed.  He had some increased EMG activity with voiding.   Timothy [name] isSchalkyo mal89who is sent in consultation by Dr. GottschalLajuana Ripplewith LUTS.  He  was previously seen by Dr. Budzyn in[place] incontinence.  He continues to have issues with incontinence.  When he gets the urge he will leak.  He has some insensible incontinence as well and wears depends.  He doesn't think he has SUI.  He has an adequate stream and generally feels he empties well.  He has no intermittency.  He has minimal nocturia but only sleeps about 5 hours nightly.    He has no hematuria or dysuria.  He has had no GU surgery.  He has had one prior UTI a couple of months ago.  He has no history of stones.   He is currently on tamsulosin.  His PSA was 0.2 on 06/27/20 and that is stable for the last 6 years.  His last testosterone level was from 9/14 and was 488.  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  He had a CT in 2019 that showed no renal issues.  The prostate was not very enlarged with a transverse diameter of 4.2cm. His PVR today is 218ml.    20mSS     Row Name 10/18/22 0900  International Prostate Symptom Score   How often have you had the sensation of not emptying your bladder? About half the time     How often have you had to urinate less than every two hours? More than half the time     How often have you found you stopped and started again several times when you urinated? Not at All     How often have you found it difficult to postpone urination? More than half the time     How often have you had a weak urinary stream? Less than half the time     How often have you had to strain to start urination? Not at All     How many times did you typically get up at night to urinate? 1 Time     Total IPSS Score 14       Quality of Life due to urinary symptoms   If you were to spend the rest of your life with your urinary condition just the way it is now how would you feel about that? Mostly Satisfied               ROS:  Review of Systems  All other systems reviewed and are negative.   Allergies  Allergen Reactions   Hydrocodone-Acetaminophen Other  (See Comments)    Makes pt. 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   RIGHT NASAL SIDEWALL- NO TX   BCC (basal cell carcinoma) 11/15/2004   LEFT CHEST- TX CURET X 3, 5FU   BCC (basal cell carcinoma) 11/15/2004   RIGHT EAR- TX CURET X 3, EXCISION   BCC (basal cell carcinoma) 08/13/2005   RIGHT EAR- TX MOHS   BCC (basal cell carcinoma) 04/01/2008   SUPERFICIAL- UPPER LEFT BACK- TX CURET X 3, 5FU   BCC (basal cell carcinoma) 12/23/2012   ULCERATED- ABOVE RIGHT LIP- TX MOHS   Cataract    Chronic kidney disease    Colon polyp    Coronary artery disease    (left main normal, LAD 25-30% stenosis, distal 30-40% stenosis, circumflex obtuse marginal 50% stenosis,  right coronary artery dominant with long 75% followed by mid 80%  stenosis followed by 90% stenosis at the ostium of the PDA.  He had  stenting of the PDA by Dr. [name].  This was a Cypher stent.   First angioplasty was 1994, Cath 2011 100% RCA ).       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    Melanoma ([place]) 12/23/2012   IN SITU- LEFT SIDEBURN-TX MOHS   Melanoma ([place]) 02/27/2017   LEFT CHEEK- TX MOHS   Myocardial infarction ([place])    per pt/ had mild heart attacks/has 3 stents   OSA (obstructive sleep apnea) 08/17/2016   SCC (squamous cell carcinoma) 11/16/2015   LEFT EAR RIM- TX EXCISION   SCC (squamous cell carcinoma) 02/27/2017   WELL DIFF- RIGHT TEMPLE-TX CURET X3,5FU   SCC (squamous cell carcinoma) 10/14/2019   WELL DIFF- RIGHT SHOULDER-TX CURET X 3, 5FU   SCC (squamous cell carcinoma) 01/24/2019   WELL DIFF- LEFT INNER CHEEK- MOHS  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   ABOVE LEFT  OUTER BROW SUPERIOR- TX CURET X 3, 5FU   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   LEFT FOREHEAD-TX CURET X 3, 5FU   SCCA (squamous cell carcinoma) of skin 04/01/2008   RIGHT HAND- TX CURET X 3, 5FU   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   LEFT SIDE OF FACE- TX CURET AFTER BIOPSY   SCCA (squamous cell carcinoma) of skin 01/12/2021   Neck - anterior (in situ)   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/27/2022   Left Zygomatic Area (in situ) (tx p bx)   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/27/2022   Right Zygomatic Area (well diff)   Sleep apnea    no c-pap   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   in situ- dorsum of nose (CX35FU)    Past Surgical History:  Procedure Laterality Date   APPENDECTOMY     CERVICAL SPINE SURGERY     CHOLECYSTECTOMY     COLONOSCOPY  2004   multiple since   CORONARY ANGIOPLASTY WITH STENT PLACEMENT     3 sents   EYE SURGERY Bilateral    cataracts   HERNIA REPAIR Bilateral    MOHS SURGERY Right 12/2021   SKIN CANCER EXCISION  03/17/2017   SKIN CANCER EXCISION  03/17/2020   left side of face    Social History   Socioeconomic History   Marital status: Married    Spouse name: Animator   Number of children: 2   Years of education: Not on file   Highest education level: Not on file  Occupational History   Occupation: retired  Tobacco Use   Smoking status: Former    Packs/day: 2.00    Years: 25.00    Total pack years: 50.00    Types: Cigarettes    Quit date: 08/14/1991    Years since quitting: 31.2    Passive exposure: Never   Smokeless tobacco: Never  Vaping Use   Vaping Use: Never used  Substance and Sexual Activity   Alcohol use: Not Currently    Comment: rare   Drug use: No   Sexual activity: Not Currently  Other Topics Concern   Not on file  Social History Narrative   Married x 60 years in 2022.   7 grandchildren.   1 great granddaughter.    Social Determinants of Health   Financial  Resource Strain: Low Risk  (11/27/2021)   Overall Financial Resource Strain (CARDIA)    Difficulty of Paying Living Expenses: Not hard at all  Food Insecurity: No Food Insecurity (11/27/2021)   Hunger Vital Sign    Worried About Running Out of Food in the Last Year: Never true    Ran Out of Food in the Last Year: Never true  Transportation Needs: No Transportation Needs (11/27/2021)   PRAPARE - Hydrologist (Medical): No    Lack of Transportation (Non-Medical): No  Physical Activity: Insufficiently Active (11/27/2021)   Exercise Vital Sign    Days of Exercise per Week: 3 days    Minutes of Exercise per Session: 20 min  Stress: No Stress Concern Present (11/27/2021)   [place]    Feeling of Stress : Not at all  Social Connections: [place] (11/27/2021)   Social Connection and Isolation Panel [NHANES]    Frequency of Communication with Friends and Family: More than three times  a week    Frequency of Social Gatherings with Friends and Family: More than three times a week    Attends Religious Services: More than 4 times per year    Active Member of [employer] or Organizations: Yes    Attends Music therapist: More than 4 times per year    Marital Status: Married  Human resources officer Violence: Not At Risk (11/27/2021)   Humiliation, Afraid, Rape, and Kick questionnaire    Fear of Current or Ex-Partner: No    Emotionally Abused: No    Physically Abused: No    Sexually Abused: No    Family History  Problem Relation Age of Onset   Cancer Mother        unsure of type   Glaucoma Mother    Heart disease Father    Coronary artery disease Father    Cancer Sister 56       granlocytosis (Wergerner's)   Colon cancer Brother 10   Nephritis Sister        20's   Cancer Brother        lung     Anti-infectives: Anti-infectives (From admission, onward)    None        Current Outpatient Medications  Medication Sig Dispense Refill   tamsulosin (FLOMAX) 0.4 MG CAPS capsule Take 1 capsule (0.4 mg total) by mouth daily. 30 capsule 11   amLODipine (NORVASC) 10 MG tablet Take 1 tablet (10 mg total) by mouth daily. Put on hold 90 tablet 3   aspirin 81 MG EC tablet Take 81 mg by mouth daily.     blood glucose meter kit and supplies Dispense based on patient and insurance preference. Use up to four times daily as directed. (FOR ICD-10 E11.21) 1 each 0   carvedilol (COREG) 12.5 MG tablet Take 1 tablet (12.5 mg total) by mouth 2 (two) times daily with a meal. 180 tablet 3   Choline Fenofibrate (FENOFIBRIC ACID) 135 MG CPDR Take 135 mg by mouth daily. 90 capsule 3   Dapagliflozin-sAXagliptin (QTERN) 10-5 MG TABS Take 1 tablet by mouth in the morning. 90 tablet 11   enalapril (VASOTEC) 2.5 MG tablet Take 1 tablet (2.5 mg total) by mouth daily. 90 tablet 3   esomeprazole (NEXIUM) 40 MG capsule Take 1 capsule (40 mg total) by mouth daily. Put on hold 90 capsule 3   ezetimibe (ZETIA) 10 MG tablet Take 1 tablet (10 mg total) by mouth daily. 90 tablet 3   glipiZIDE (GLUCOTROL) 5 MG tablet Take 1 tablet (5 mg total) by mouth daily before breakfast. For diabetes. 90 tablet 1   isosorbide mononitrate (IMDUR) 60 MG 24 hr tablet TAKE 1 TABLET DAILY 90 tablet 2   Lancets MISC UAD to test BGs up to 4 times daily E11.21 100 each 12   latanoprost (XALATAN) 0.005 % ophthalmic solution Place 1 drop into both eyes at bedtime.     nitroGLYCERIN (NITROSTAT) 0.4 MG SL tablet Place 1 tablet (0.4 mg total) under the tongue every 5 (five) minutes as needed. 25 tablet 4   ONETOUCH VERIO test strip CHECK BLOOD SUGAR UP TO FOUR TIMES DAILY 100 strip 8   rosuvastatin (CRESTOR) 20 MG tablet Take 1 tablet (20 mg total) by mouth daily. 90 tablet 3   VASCEPA 1 g capsule Take 2 capsules (2 g total) by mouth 2 (two) times daily. 360 capsule 3   Vitamin D, Ergocalciferol, (DRISDOL) 1.25 MG (50000  UNIT) CAPS capsule Take 1 capsule (50,000  Units total) by mouth every 7 (seven) days. 5 capsule 0   No current facility-administered medications for this visit.     Objective: Vital signs in last 24 hours: BP (!) 150/60   Pulse 76   Intake/Output from previous day: No intake/output data recorded. Intake/Output this shift: _0 @   Physical Exam Vitals reviewed.  Constitutional:      Appearance: Normal appearance.  Neurological:     Mental Status: He is alert.     Lab Results:  Results for orders placed or performed in visit on 10/18/22 (from the past 24 hour(s))  Urinalysis, Routine w reflex microscopic     Status: Abnormal   Collection Time: 10/18/22  9:11 AM  Result Value Ref Range   Specific Gravity, UA 1.010 1.005 - 1.030   pH, UA 5.0 5.0 - 7.5   Color, UA Yellow Yellow   Appearance Ur Clear Clear   Leukocytes,UA Negative Negative   Protein,UA Negative Negative/Trace   Glucose, UA 3+ (A) Negative   Ketones, UA Negative Negative   RBC, UA Negative Negative   Bilirubin, UA Negative Negative   Urobilinogen, Ur 0.2 0.2 - 1.0 mg/dL   Nitrite, UA Negative Negative   Microscopic Examination See below:    Narrative   Performed at:  [facility] [address], [place], [place]  [id number] Lab Director: Mina Marble MT, Phone:  [phone]  Microscopic Examination     Status: Abnormal   Collection Time: 10/18/22  9:11 AM   Urine  Result Value Ref Range   WBC, UA 0-5 0 - 5 /hpf   RBC, Urine None seen 0 - 2 /hpf   Epithelial Cells (non renal) 0-10 0 - 10 /hpf   Bacteria, UA Few (A) None seen/Few   Narrative   Performed at:  [facility], [place], [place]  [id number] Lab Director: [place], Phone:  [phone]     BMET No results for input(s): "NA", "K", "CL", "CO2", "GLUCOSE", "BUN", "CREATININE", "CALCIUM" in the last 72 hours. PT/INR No results for input(s): "LABPROT", "INR" in the last 72  hours. ABG No results for input(s): "PHART", "HCO3" in the last 72 hours.  Invalid input(s): "PCO2", "PO2"  Studies/Results:   Assessment/Plan: BPH with BOO and UUI with incomplete emptying.  He would like a cheaper med than silodosin so I will switch him to tamsulosin.   He will return in 6 months.   Urethral stricture of the fossa.  This was dilated for scope passage.  He continues to have a good stream.     Meds ordered this encounter  Medications   tamsulosin (FLOMAX) 0.4 MG CAPS capsule    Sig: Take 1 capsule (0.4 mg total) by mouth daily.    Dispense:  30 capsule    Refill:  11      Orders Placed This Encounter  Procedures   Microscopic Examination   Urinalysis, Routine w reflex microscopic   BLADDER SCAN AMB NON-IMAGING     Return in about 6 months (around 04/18/2023) for PVR on return.  CC: Dr. Adam Phenix.       Irine Seal 10/19/2022 [phone] ID: [name], male   DOB: September 04, 1941, 81 y.o.   MRN: [id number]

## 2022-10-19 ENCOUNTER — Ambulatory Visit
Admission: RE | Admit: 2022-10-19 | Discharge: 2022-10-19 | Disposition: A | Discharge status: Home / Self Care | Payer: Medicare HMO | Source: Ambulatory Visit | Attending: Radiation Oncology | Admitting: Radiation Oncology

## 2022-10-19 ENCOUNTER — Encounter: Payer: Self-pay | Admitting: Radiation Oncology

## 2022-10-19 VITALS — BP 144/65 | HR 72 | Temp 97.9°F | Resp 18 | Ht 68.0 in | Wt 185.2 lb

## 2022-10-19 DIAGNOSIS — C44320 Squamous cell carcinoma of skin of unspecified parts of face: Secondary | ICD-10-CM | POA: Insufficient documentation

## 2022-10-19 DIAGNOSIS — Z923 Personal history of irradiation: Secondary | ICD-10-CM | POA: Insufficient documentation

## 2022-10-19 DIAGNOSIS — Z7982 Long term (current) use of aspirin: Secondary | ICD-10-CM | POA: Insufficient documentation

## 2022-10-19 DIAGNOSIS — Z7984 Long term (current) use of oral hypoglycemic drugs: Secondary | ICD-10-CM | POA: Insufficient documentation

## 2022-10-19 DIAGNOSIS — Z79899 Other long term (current) drug therapy: Secondary | ICD-10-CM | POA: Diagnosis not present

## 2022-10-19 NOTE — Progress Notes (Signed)
Radiation Oncology         [phone] ________________________________  Name: Alistar Mcenery MRN: [id number]  Date: 10/19/2022  DOB: 23-Mar-1941  Follow-Up Visit Note  outatient  CC: Janora Norlander, DO  Janann August, MD  Diagnosis and Prior Radiotherapy:    ICD-10-CM   1. Squamous cell carcinoma of face  C44.320     2. Squamous cell carcinoma, face  C44.320       Cancer Staging:  Cancer Staging  Squamous cell carcinoma, face Staging form: Cutaneous Carcinoma of the Head and Neck, AJCC 8th Edition - Pathologic stage from 06/11/2022: Stage III (pT3, pN0, cM0) - Signed by Eppie Gibson, MD on 06/11/2022 Stage prefix: Initial diagnosis Extraosseous extension: Absent   Intent: Curative  Radiation Treatment Dates: 08/22/2022 through 09/18/2022 Site Technique Total Dose (Gy) Dose per Fx (Gy) Completed Fx Beam Energies  Cheek, Right: [id number] specialPort 50/50 2.5 20/20 6E, 9E   CHIEF COMPLAINT: Here for follow-up and surveillance of skin cancer  Narrative:  The patient returns today for routine follow-up.  He is doing well.  Skin is healing nicely.  He still applies Neosporin and sonafine cream                         ALLERGIES:  is allergic to hydrocodone-acetaminophen and lipitor [atorvastatin].  Meds: Current Outpatient Medications  Medication Sig Dispense Refill   amLODipine (NORVASC) 10 MG tablet Take 1 tablet (10 mg total) by mouth daily. Put on hold 90 tablet 3   aspirin 81 MG EC tablet Take 81 mg by mouth daily.     blood glucose meter kit and supplies Dispense based on patient and insurance preference. Use up to four times daily as directed. (FOR ICD-10 E11.21) 1 each 0   carvedilol (COREG) 12.5 MG tablet Take 1 tablet (12.5 mg total) by mouth 2 (two) times daily with a meal. 180 tablet 3   Choline Fenofibrate (FENOFIBRIC ACID) 135 MG CPDR Take 135 mg by mouth daily. 90 capsule 3   Dapagliflozin-sAXagliptin (QTERN) 10-5 MG TABS Take 1 tablet by mouth in the  morning. 90 tablet 11   enalapril (VASOTEC) 2.5 MG tablet Take 1 tablet (2.5 mg total) by mouth daily. 90 tablet 3   esomeprazole (NEXIUM) 40 MG capsule Take 1 capsule (40 mg total) by mouth daily. Put on hold 90 capsule 3   ezetimibe (ZETIA) 10 MG tablet Take 1 tablet (10 mg total) by mouth daily. 90 tablet 3   glipiZIDE (GLUCOTROL) 5 MG tablet Take 1 tablet (5 mg total) by mouth daily before breakfast. For diabetes. 90 tablet 1   isosorbide mononitrate (IMDUR) 60 MG 24 hr tablet TAKE 1 TABLET DAILY 90 tablet 2   Lancets MISC UAD to test BGs up to 4 times daily E11.21 100 each 12   latanoprost (XALATAN) 0.005 % ophthalmic solution Place 1 drop into both eyes at bedtime.     nitroGLYCERIN (NITROSTAT) 0.4 MG SL tablet Place 1 tablet (0.4 mg total) under the tongue every 5 (five) minutes as needed. 25 tablet 4   ONETOUCH VERIO test strip CHECK BLOOD SUGAR UP TO FOUR TIMES DAILY 100 strip 8   rosuvastatin (CRESTOR) 20 MG tablet Take 1 tablet (20 mg total) by mouth daily. 90 tablet 3   tamsulosin (FLOMAX) 0.4 MG CAPS capsule Take 1 capsule (0.4 mg total) by mouth daily. 30 capsule 11   VASCEPA 1 g capsule Take 2 capsules (2 g total) by mouth  2 (two) times daily. 360 capsule 3   Vitamin D, Ergocalciferol, (DRISDOL) 1.25 MG (50000 UNIT) CAPS capsule Take 1 capsule (50,000 Units total) by mouth every 7 (seven) days. 5 capsule 0   No current facility-administered medications for this encounter.    Physical Findings: The patient is in no acute distress. Patient is alert and oriented.  height is _0  (1.727 m) and weight is 185 lb 4 oz (84 kg). His oral temperature is 97.9 F (36.6 C). His blood pressure is 144/65 (abnormal) and his pulse is 72. His respiration is 18 and oxygen saturation is 97%. .    Satisfactory healing thus far over the right cheek.  There is still some resolving moist desquamation which is for the most part dried up.  No sign of local recurrence  Lab Findings: Lab Results   Component Value Date   WBC 5.6 04/30/2022   HGB 13.8 04/30/2022   HCT 40.8 04/30/2022   MCV 87 04/30/2022   PLT 125 (L) 04/30/2022    Radiographic Findings: No results found.  Impression/Plan: He is healing well from radiation therapy.  Recommend continued application of Neosporin until his skin desquamation has completely dried up.  Continue to apply sonafine to the areas where he does not have any active desquamation.  I will see him back in 3 months to make sure he is fully healed.  He knows to continue to follow with dermatology for surveillance as well.  On date of service, in total, I spent 20 minutes on this encounter. Patient was seen in person.  _____________________________________   Eppie Gibson, MD

## 2022-10-30 ENCOUNTER — Telehealth: Payer: Self-pay | Admitting: Family Medicine

## 2022-11-01 NOTE — Telephone Encounter (Signed)
Called patient to let him know this was sent to [place] assistance. He got a letter from his insurance that qtern would not be available after December and that vasepa and fenofibric will no longer be covered on his insurance. He wants to change is insurance if you think this would help him get covered.

## 2022-11-05 ENCOUNTER — Other Ambulatory Visit: Payer: Self-pay | Admitting: Family Medicine

## 2022-11-05 DIAGNOSIS — E1121 Type 2 diabetes mellitus with diabetic nephropathy: Secondary | ICD-10-CM

## 2022-11-06 ENCOUNTER — Ambulatory Visit: Payer: Medicare HMO | Admitting: Family

## 2022-11-06 ENCOUNTER — Encounter: Payer: Self-pay | Admitting: Family

## 2022-11-06 VITALS — BP 173/71 | HR 74 | Temp 97.4°F | Ht 68.0 in | Wt 185.6 lb

## 2022-11-06 DIAGNOSIS — E1121 Type 2 diabetes mellitus with diabetic nephropathy: Secondary | ICD-10-CM | POA: Diagnosis not present

## 2022-11-06 DIAGNOSIS — E1122 Type 2 diabetes mellitus with diabetic chronic kidney disease: Secondary | ICD-10-CM

## 2022-11-06 DIAGNOSIS — N183 Chronic kidney disease, stage 3 unspecified: Secondary | ICD-10-CM

## 2022-11-06 DIAGNOSIS — E1159 Type 2 diabetes mellitus with other circulatory complications: Secondary | ICD-10-CM | POA: Diagnosis not present

## 2022-11-06 DIAGNOSIS — M545 Low back pain, unspecified: Secondary | ICD-10-CM

## 2022-11-06 DIAGNOSIS — I152 Hypertension secondary to endocrine disorders: Secondary | ICD-10-CM

## 2022-11-06 MED ORDER — PREDNISONE 10 MG (21) PO TBPK: ORAL_TABLET | ORAL | 0 refills | Status: DC

## 2022-11-06 NOTE — Progress Notes (Signed)
Subjective:    Patient ID: Timothy [name], male    DOB: 1941-04-08, 81 y.o.   MRN: [id number]  Chief Complaint  Patient presents with   Back Pain    Wants a prednisone taper pack    PT presents to the office today with lower back pain that started last week. He is followed by Pain Management  who told him to follow up with his PCP for a steroid pack. He has taken this in the past and worked well.   He is a diabetic and his last A1C 6.5.  Back Pain This is a new problem. The current episode started 1 to 4 weeks ago. The problem occurs intermittently. The problem has been waxing and waning since onset. The pain is present in the lumbar spine. The quality of the pain is described as aching. The pain is mild. The symptoms are aggravated by twisting and bending. He has tried NSAIDs and bed rest for the symptoms. The treatment provided mild relief.  Hypertension This is a chronic problem. The current episode started more than 1 year ago. The problem has been waxing and waning since onset. The problem is uncontrolled. Associated symptoms include malaise/fatigue. Pertinent negatives include no peripheral edema or shortness of breath.      Review of Systems  Constitutional:  Positive for malaise/fatigue.  Respiratory:  Negative for shortness of breath.   Musculoskeletal:  Positive for back pain.  All other systems reviewed and are negative.      Objective:   Physical Exam Vitals reviewed.  Constitutional:      General: He is not in acute distress.    Appearance: He is well-developed. He is obese.  HENT:     Head: Normocephalic.  Eyes:     General:        Right eye: No discharge.        Left eye: No discharge.     Pupils: Pupils are equal, round, and reactive to light.  Neck:     Thyroid: No thyromegaly.  Cardiovascular:     Rate and Rhythm: Normal rate and regular rhythm.     Heart sounds: Normal heart sounds. No murmur heard. Pulmonary:     Effort: Pulmonary effort is normal.  No respiratory distress.     Breath sounds: Normal breath sounds. No wheezing.  Abdominal:     General: Bowel sounds are normal. There is no distension.     Palpations: Abdomen is soft.     Tenderness: There is no abdominal tenderness.  Musculoskeletal:        General: No tenderness.     Cervical back: Normal range of motion and neck supple.     Comments: Pain in lumbar with flexion and extension  Skin:    General: Skin is warm and dry.     Findings: No erythema or rash.  Neurological:     Mental Status: He is alert and oriented to person, place, and time.     Cranial Nerves: No cranial nerve deficit.     Deep Tendon Reflexes: Reflexes are normal and symmetric.  Psychiatric:        Behavior: Behavior normal.        Thought Content: Thought content normal.        Judgment: Judgment normal.       BP (!) 173/71   Pulse 74   Temp (!) 97.4 F (36.3 C) (Temporal)   Ht '5\' 8"'$  (1.727 m)   Wt 185 lb 9.6 oz (84.2  kg)   SpO2 96%   BMI 28.22 kg/m      Assessment & Plan:  Timothy [name] comes in today with chief complaint of Back Pain (Wants a prednisone taper pack )   Diagnosis and orders addressed:  1. Acute bilateral low back pain without sciatica Rest ROM exercises  Only a few days of motrin since CKD with food - predniSONE (STERAPRED UNI-PAK 21 TAB) 10 MG (21) TBPK tablet; Use as directed  Dispense: 21 tablet; Refill: 0  2. Type 2 diabetes mellitus with diabetic nephropathy, without long-term current use of insulin (HCC) Strict low carb because of prednisone   3. Hypertension associated with type 2 diabetes mellitus ([place]) Will monitor at home. States has been good at home.    4. CKD stage 3 due to type 2 diabetes mellitus ([place])  Evelina Dun, FNP

## 2022-11-06 NOTE — Patient Instructions (Signed)
Acute Back Pain, Adult Acute back pain is sudden and usually short-lived. It is often caused by an injury to the muscles and tissues in the back. The injury may result from: A muscle, tendon, or ligament getting overstretched or torn. Ligaments are tissues that connect bones to each other. Lifting something improperly can cause a back strain. Wear and tear (degeneration) of the spinal disks. Spinal disks are circular tissue that provide cushioning between the bones of the spine (vertebrae). Twisting motions, such as while playing sports or doing yard work. A hit to the back. Arthritis. You may have a physical exam, lab tests, and imaging tests to find the cause of your pain. Acute back pain usually goes away with rest and home care. Follow these instructions at home: Managing pain, stiffness, and swelling Take over-the-counter and prescription medicines only as told by your health care provider. Treatment may include medicines for pain and inflammation that are taken by mouth or applied to the skin, or muscle relaxants. Your health care provider may recommend applying ice during the first 24-48 hours after your pain starts. To do this: Put ice in a plastic bag. Place a towel between your skin and the bag. Leave the ice on for 20 minutes, 2-3 times a day. Remove the ice if your skin turns bright red. This is very important. If you cannot feel pain, heat, or cold, you have a greater risk of damage to the area. If directed, apply heat to the affected area as often as told by your health care provider. Use the heat source that your health care provider recommends, such as a moist heat pack or a heating pad. Place a towel between your skin and the heat source. Leave the heat on for 20-30 minutes. Remove the heat if your skin turns bright red. This is especially important if you are unable to feel pain, heat, or cold. You have a greater risk of getting burned. Activity  Do not stay in bed. Staying in  bed for more than 1-2 days can delay your recovery. Sit up and stand up straight. Avoid leaning forward when you sit or hunching over when you stand. If you work at a desk, sit close to it so you do not need to lean over. Keep your chin tucked in. Keep your neck drawn back, and keep your elbows bent at a 90-degree angle (right angle). Sit high and close to the steering wheel when you drive. Add lower back (lumbar) support to your car seat, if needed. Take short walks on even surfaces as soon as you are able. Try to increase the length of time you walk each day. Do not sit, drive, or stand in one place for more than 30 minutes at a time. Sitting or standing for long periods of time can put stress on your back. Do not drive or use heavy machinery while taking prescription pain medicine. Use proper lifting techniques. When you bend and lift, use positions that put less stress on your back: Bend your knees. Keep the load close to your body. Avoid twisting. Exercise regularly as told by your health care provider. Exercising helps your back heal faster and helps prevent back injuries by keeping muscles strong and flexible. Work with a physical therapist to make a safe exercise program, as recommended by your health care provider. Do any exercises as told by your physical therapist. Lifestyle Maintain a healthy weight. Extra weight puts stress on your back and makes it difficult to have good   posture. Avoid activities or situations that make you feel anxious or stressed. Stress and anxiety increase muscle tension and can make back pain worse. Learn ways to manage anxiety and stress, such as through exercise. General instructions Sleep on a firm mattress in a comfortable position. Try lying on your side with your knees slightly bent. If you lie on your back, put a pillow under your knees. Keep your head and neck in a straight line with your spine (neutral position) when using electronic equipment like  smartphones or pads. To do this: Raise your smartphone or pad to look at it instead of bending your head or neck to look down. Put the smartphone or pad at the level of your face while looking at the screen. Follow your treatment plan as told by your health care provider. This may include: Cognitive or behavioral therapy. Acupuncture or massage therapy. Meditation or yoga. Contact a health care provider if: You have pain that is not relieved with rest or medicine. You have increasing pain going down into your legs or buttocks. Your pain does not improve after 2 weeks. You have pain at night. You lose weight without trying. You have a fever or chills. You develop nausea or vomiting. You develop abdominal pain. Get help right away if: You develop new bowel or bladder control problems. You have unusual weakness or numbness in your arms or legs. You feel faint. These symptoms may represent a serious problem that is an emergency. Do not wait to see if the symptoms will go away. Get medical help right away. Call your local emergency services (911 in the [place] Do not drive yourself to the hospital. Summary Acute back pain is sudden and usually short-lived. Use proper lifting techniques. When you bend and lift, use positions that put less stress on your back. Take over-the-counter and prescription medicines only as told by your health care provider, and apply heat or ice as told. This information is not intended to replace advice given to you by your health care provider. Make sure you discuss any questions you have with your health care provider. Document Revised: 02/24/2021 Document Reviewed: 02/24/2021 Elsevier Patient Education  2023 Elsevier Inc.  

## 2022-11-13 ENCOUNTER — Ambulatory Visit: Payer: Medicare HMO | Attending: Internal Medicine | Admitting: Internal Medicine

## 2022-11-13 ENCOUNTER — Telehealth: Payer: Self-pay | Admitting: Internal Medicine

## 2022-11-13 ENCOUNTER — Encounter: Payer: Self-pay | Admitting: Internal Medicine

## 2022-11-13 VITALS — BP 128/62 | HR 82 | Ht 68.0 in | Wt 187.2 lb

## 2022-11-13 DIAGNOSIS — E785 Hyperlipidemia, unspecified: Secondary | ICD-10-CM | POA: Diagnosis not present

## 2022-11-13 DIAGNOSIS — I2510 Atherosclerotic heart disease of native coronary artery without angina pectoris: Secondary | ICD-10-CM | POA: Diagnosis not present

## 2022-11-13 DIAGNOSIS — E781 Pure hyperglyceridemia: Secondary | ICD-10-CM

## 2022-11-13 MED ORDER — ICOSAPENT ETHYL 1 G PO CAPS: 2.0000 g | ORAL_CAPSULE | Freq: Two times a day (BID) | ORAL | 11 refills | Status: DC

## 2022-11-13 NOTE — Progress Notes (Signed)
OFFICE CONSULT NOTE  Chief Complaint: Follow-up dyslipidemia  Primary Care Physician: Janora Norlander, DO  HPI:  Timothy [name] is a 81 y.o. male who is being seen today for the evaluation of dyslipidemia at the request of [name], MD. This is a pleasant 81 year old male patient of Dr. Percival Spanish who sees Dr. Lynnette Caffey his primary care provider.  He is referred for evaluation and management of dyslipidemia.  He has a strong history of coronary artery disease with multiple heart catheterizations and prior coronary intervention, dating back to 68.  He also has chronic kidney disease, hypertension and type 2 diabetes.  He had significant dyslipidemia with primary elevated LDL, triglycerides and low HDL less than 20.  This is a typical diabetic or metabolic dyslipidemia.  Recent labs were 5 months ago indicating a total cholesterol 147, triglycerides 510, HDL 18 and LDL was not calculated.  He also had a lipoprotein NMR 2 years ago which showed LDL particle number of 1229 and triglycerides again over 500 with a high small LDL particle number of 1086.  Mr. Kolbe has a history of intolerance to atorvastatin which caused hot flashes.  He is currently on rosuvastatin 10 mg but has not tried higher doses.  He also is written to take fenofibrate 135 mg daily and Vascepa 2 g twice daily.  After much discussion it seems that he is not regularly taking the Vascepa.  He said initially was that they are larger capsules and did not fit in his pillbox therefore he has a separate container but often forgets to take the medication.  Review of filled prescriptions show that he did not get a scription filled for Vascepa since 2017.  We do know that he was getting some samples from his primary care provider however the suggest that compliance with the medication is not where it needs to be.  He does report somewhat of an atherogenic diet and denies any routine alcohol use.  With regards to A1c, this was most  recently 6.8, from 6.110 months ago.  01/08/2019  [name] is seen today in follow-up.  He reports compliance with Vascepa 2 g twice daily.  He says he may have only missed about 5 doses over the past several months.  I suspect this will make a big difference in his numbers however unfortunately he did not have his fasting lipid profile obtained.  Also he is nonfasting today.  This could significantly affect his lipid profile.  We will need to repeat the lipids as previously recommended.  Overall he is tolerating medication.  03/04/2020  [name] returns for annual follow-up.  I am pleased to report his lipids are even better.  His total cholesterol now is 148, triglycerides 247 (down from over 500), HDL 25 and LDL 82.  There is been a slight uptake in his LDL cholesterol.  He is on 10 mg of rosuvastatin.  03/14/2021  [name] is seen today in follow-up.  He seems to be doing well.  He saw Dr. Percival Spanish in January and seemed to be doing well from a cardiac standpoint.  He had repeat lipids showed total cholesterol 133, triglycerides 398, HDL 21 and LDL 52.  He reports compliance with the Vascepa, fenofibrate and rosuvastatin 20 mg daily.  Overall seems to be at target on this medication regimen.  Although his triglycerides are not "normal", he is still receiving benefit from the Vascepa.  05/08/2022  [name] returns today for follow-up.  He had  recent repeat lipids which do show an increase in triglycerides now up to 441, increased from 398 a year ago.  He is total cholesterol is 171, HDL 24 and LDL 76.  Blood pressure was also noted to be elevated today however he is reportedly not taking amlodipine.  Current medicines include Vascepa 2 g twice daily and fenofibrate 135 mg daily.  He has not on a statin.  He reports no history of intolerance in the past.  11/13/2022  [name] is seen today for follow-up.  Has had a difficult past year.  He had melanoma and underwent significant resection  to the right side of his face and around the right ear.  This required numerous surgeries and radiation therapy.  He says he feels that they were successful in removing it.  He denies any chest pain.  His lipids are actually much better controlled.  Total cholesterol now 122, triglycerides 235, HDL 24 and LDL 60.  Triglycerides previously were as high as over 400.  He reports compliance with Vascepa but the cost is extensive namely $1300 a month currently.  This is untenable and we talked about other options including applying for [employer] grant.  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   RIGHT NASAL SIDEWALL- NO TX   BCC (basal cell carcinoma) 11/15/2004   LEFT CHEST- TX CURET X 3, 5FU   BCC (basal cell carcinoma) 11/15/2004   RIGHT EAR- TX CURET X 3, EXCISION   BCC (basal cell carcinoma) 08/13/2005   RIGHT EAR- TX MOHS   BCC (basal cell carcinoma) 04/01/2008   SUPERFICIAL- UPPER LEFT BACK- TX CURET X 3, 5FU   BCC (basal cell carcinoma) 12/23/2012   ULCERATED- ABOVE RIGHT LIP- TX MOHS   Cataract    Chronic kidney disease    Colon polyp    Coronary artery disease    (left main normal, LAD 25-30% stenosis, distal 30-40% stenosis, circumflex obtuse marginal 50% stenosis,  right coronary artery dominant with long 75% followed by mid 80%  stenosis followed by 90% stenosis at the ostium of the PDA.  He had  stenting of the PDA by Dr. [name].  This was a Cypher stent.   First angioplasty was 1994, Cath 2011 100% RCA ).       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   LEFT FOREHEAD-TX CURET X 3, 5FU  SCCA (squamous cell carcinoma) of skin 04/01/2008   RIGHT HAND- TX CURET X 3, 5FU   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   LEFT SIDE OF FACE- TX CURET AFTER BIOPSY   SCCA (squamous cell carcinoma) of skin 01/12/2021   Neck - anterior (in situ)   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/27/2022   Left Zygomatic Area (in situ) (tx p bx)   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/27/2022   Right Zygomatic Area (well diff)   Sleep apnea    no c-pap   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   in situ- dorsum of nose (CX35FU)    Past Surgical History:  Procedure Laterality Date   APPENDECTOMY     CERVICAL SPINE SURGERY     CHOLECYSTECTOMY     COLONOSCOPY  2004   multiple since   CORONARY ANGIOPLASTY WITH STENT PLACEMENT     3 sents   EYE SURGERY Bilateral    cataracts   HERNIA REPAIR Bilateral    MOHS SURGERY Right 12/2021   SKIN CANCER EXCISION  03/17/2017   SKIN CANCER EXCISION  03/17/2020   left side of face    FAMHx:  Family History  Problem Relation Age of Onset   Cancer Mother        unsure of type   Glaucoma Mother    Heart disease Father    Coronary artery disease Father    Cancer Sister 95       granlocytosis (Wergerner's)   Colon cancer Brother 67   Nephritis Sister        20's   Cancer Brother        lung     SOCHx:   reports that he quit  smoking about 31 years ago. His smoking use included cigarettes. He has a 50.00 pack-year smoking history. He has never been exposed to tobacco smoke. He has never used smokeless tobacco. He reports that he does not currently use alcohol. He reports that he does not use drugs.  ALLERGIES:  Allergies  Allergen Reactions   Hydrocodone-Acetaminophen Other (See Comments)    Makes pt. Feel loopy   Lipitor [Atorvastatin]     myalgias    ROS: Pertinent items noted in HPI and remainder of comprehensive ROS otherwise negative.  HOME MEDS: Current Outpatient Medications on File Prior to Visit  Medication Sig Dispense Refill   amLODipine (NORVASC) 10 MG tablet Take 1 tablet (10 mg total) by mouth daily. Put on hold 90 tablet 3   aspirin 81 MG EC tablet Take 81 mg by mouth daily.     blood glucose meter kit and supplies Dispense based on patient and insurance preference. Use up to four times daily as directed. (FOR ICD-10 E11.21) 1 each 0   carvedilol (COREG) 12.5 MG tablet Take 1 tablet (12.5 mg total) by mouth 2 (two) times daily with a meal. 180 tablet 3   Choline Fenofibrate (FENOFIBRIC ACID) 135 MG CPDR Take 135 mg by mouth daily. 90 capsule 3   Dapagliflozin-sAXagliptin (QTERN) 10-5 MG TABS Take 1 tablet by mouth in the morning. 90 tablet 11   enalapril (VASOTEC) 2.5 MG tablet Take 1 tablet (2.5 mg total) by mouth daily. 90 tablet 3   esomeprazole (NEXIUM) 40 MG capsule Take 1 capsule (40 mg total) by mouth daily. Put on hold 90 capsule 3   ezetimibe (ZETIA) 10 MG tablet Take 1 tablet (10 mg total) by mouth daily. 90 tablet 3   glipiZIDE (  GLUCOTROL) 5 MG tablet TAKE ONE TABLET ONCE DAILY BEFORE BREAKFAST 90 tablet 0   isosorbide mononitrate (IMDUR) 60 MG 24 hr tablet TAKE 1 TABLET DAILY 90 tablet 2   Lancets MISC UAD to test BGs up to 4 times daily E11.21 100 each 12   latanoprost (XALATAN) 0.005 % ophthalmic solution Place 1 drop into both eyes at bedtime.     nitroGLYCERIN (NITROSTAT) 0.4  MG SL tablet Place 1 tablet (0.4 mg total) under the tongue every 5 (five) minutes as needed. 25 tablet 4   ONETOUCH VERIO test strip CHECK BLOOD SUGAR UP TO FOUR TIMES DAILY 100 strip 8   predniSONE (STERAPRED UNI-PAK 21 TAB) 10 MG (21) TBPK tablet Use as directed 21 tablet 0   rosuvastatin (CRESTOR) 20 MG tablet Take 1 tablet (20 mg total) by mouth daily. 90 tablet 3   tamsulosin (FLOMAX) 0.4 MG CAPS capsule Take 1 capsule (0.4 mg total) by mouth daily. 30 capsule 11   VASCEPA 1 g capsule Take 2 capsules (2 g total) by mouth 2 (two) times daily. 360 capsule 3   Vitamin D, Ergocalciferol, (DRISDOL) 1.25 MG (50000 UNIT) CAPS capsule Take 1 capsule (50,000 Units total) by mouth every 7 (seven) days. 5 capsule 0   No current facility-administered medications on file prior to visit.    LABS/IMAGING: No results found for this or any previous visit (from the past 48 hour(s)). No results found.  LIPID PANEL:    Component Value Date/Time   CHOL 122 09/13/2022 0821   TRIG 235 (H) 09/13/2022 0821   TRIG 538 (H) 09/22/2016 0806   HDL 24 (L) 09/13/2022 0821   HDL 15 (L) 09/22/2016 0806   CHOLHDL 5.1 (H) 09/13/2022 0821   CHOLHDL 4.9 04/08/2013 0827   VLDL 55 (H) 04/08/2013 0827   LDLCALC 60 09/13/2022 0821   LDLCALC 48 07/16/2014 0818   LDLDIRECT 88 03/01/2020 0000    WEIGHTS: Wt Readings from Last 3 Encounters:  11/13/22 187 lb 3.2 oz (84.9 kg)  11/06/22 185 lb 9.6 oz (84.2 kg)  10/19/22 185 lb 4 oz (84 kg)    VITALS: BP 128/62   Pulse 82   Ht 5' 8" (1.727 m)   Wt 187 lb 3.2 oz (84.9 kg)   SpO2 96%   BMI 28.46 kg/m   EXAM: Deferred  EKG: Deferred  ASSESSMENT: Coronary artery disease status post prior PCI Mixed dyslipidemia (elevated LDL and triglycerides, low HDL)  PLAN: 1.   Mr. Coupe has the best lipids that I seen in some time.  He is doing well on his current regimen however Vascepa is unaffordable for him.  We could consider other options such as a generic or  Lovaza but if possible try to get him on a grant that would cover the co-pays for it.  He is agreeable to applying for it.  He says he has a supply that would last through December.  Plan otherwise follow-up with me annually or sooner as necessary.  He does follow with Dr. Percival Spanish for general cardiology needs.  Pixie Casino, MD, [facility], [place] Director of the Advanced Lipid Disorders &  Cardiovascular Risk Reduction Clinic Diplomate of the American Board of Clinical Lipidology Attending Cardiologist  Direct Dial: [phone]  Fax: [phone]  Website:  [email or web address] 11/13/2022, 1:31 PM

## 2022-11-13 NOTE — Telephone Encounter (Signed)
Pt c/o medication issue:  1. Name of Medication: icosapent ehtyl   2. How are you currently taking this medication (dosage and times per day)?   3. Are you having a reaction (difficulty breathing--STAT)?   4. What is your medication issue? Pharmacy is calling stating patient said this medication should be called in, but they do not see this prescription currently. Requesting this be sent in, or a call to get clarification.

## 2022-11-13 NOTE — Patient Instructions (Signed)
Medication Instructions:  CHANGE vascepa to generic icosapent ehtyl -- 2 capsules twice daily   *If you need a refill on your cardiac medications before your next appointment, please call your pharmacy*   Lab Work: FASTING lipid panel in 3-4 months  If you have labs (blood work) drawn today and your tests are completely normal, you will receive your results only by: [place] (if you have MyChart) OR A paper copy in the mail If you have any lab test that is abnormal or we need to change your treatment, we will call you to review the results.   Follow-Up: At [facility], you and your health needs are our priority.  As part of our continuing mission to provide you with exceptional heart care, we have created designated Provider Care Teams.  These Care Teams include your primary Cardiologist (physician) and Advanced Practice Providers (APPs -  Physician Assistants and Nurse Practitioners) who all work together to provide you with the care you need, when you need it.  We recommend signing up for the patient portal called "MyChart".  Sign up information is provided on this After Visit Summary.  MyChart is used to connect with patients for Virtual Visits (Telemedicine).  Patients are able to view lab/test results, encounter notes, upcoming appointments, etc.  Non-urgent messages can be sent to your provider as well.   To learn more about what you can do with MyChart, go to [email or web address].    Your next appointment:    12 months with Dr [name]

## 2022-11-14 NOTE — Telephone Encounter (Signed)
Called patient and informed him that due to him picking up previous Brand Rx that his insurance will not allow for the refill to refilled at this time due to it being to soon. I gave him the name of the person I spoke with at the pharmacy to call if he has any questions. He thanked me for calling.

## 2022-11-14 NOTE — Telephone Encounter (Signed)
Called patient's pharmacy and spoke with Josh. Josh stated that they received the Rx and that it was just to soon to be refilled per patient's insurance. Due to patient picking up Brand name Rx the new Rx for generic Rx will be to soon to fill. I informed him that I will give patient a cal with this information.

## 2022-11-15 NOTE — Telephone Encounter (Signed)
MAILED TO PT'S HOME

## 2022-11-29 ENCOUNTER — Telehealth: Payer: Self-pay

## 2022-11-29 NOTE — Telephone Encounter (Signed)
Informed patient application is being faxed to company and to keep an eye out for any mail from the company.

## 2022-12-26 DIAGNOSIS — L244 Irritant contact dermatitis due to drugs in contact with skin: Secondary | ICD-10-CM | POA: Diagnosis not present

## 2023-01-04 ENCOUNTER — Telehealth: Payer: Self-pay | Admitting: *Deleted

## 2023-01-04 MED ORDER — ACCU-CHEK GUIDE ME W/DEVICE KIT: PACK | 0 refills | Status: AC

## 2023-01-04 MED ORDER — ACCU-CHEK SOFTCLIX LANCETS MISC: 11 refills | Status: DC

## 2023-01-04 MED ORDER — ACCU-CHEK GUIDE VI STRP: ORAL_STRIP | 11 refills | Status: DC

## 2023-01-04 NOTE — Telephone Encounter (Signed)
Fax from [employer] doesn't cover Glory Roseb[place] now Request for [facility] Rx, strips & lancets Scripts sent

## 2023-01-09 ENCOUNTER — Telehealth: Payer: Self-pay | Admitting: Radiation Oncology

## 2023-01-09 NOTE — Telephone Encounter (Signed)
Pt called to cx upcoming f/u 2/6 with Dr. Isidore Moos. Pt stated he did not feel that he needed this appt at this time and is not interested in r/s.

## 2023-01-14 ENCOUNTER — Ambulatory Visit (INDEPENDENT_AMBULATORY_CARE_PROVIDER_SITE_OTHER): Payer: Medicare HMO | Admitting: Family Medicine

## 2023-01-14 ENCOUNTER — Encounter: Payer: Self-pay | Admitting: Family Medicine

## 2023-01-14 VITALS — BP 150/84 | HR 91 | Temp 98.5°F | Ht 68.0 in | Wt 182.0 lb

## 2023-01-14 DIAGNOSIS — E785 Hyperlipidemia, unspecified: Secondary | ICD-10-CM | POA: Diagnosis not present

## 2023-01-14 DIAGNOSIS — I152 Hypertension secondary to endocrine disorders: Secondary | ICD-10-CM

## 2023-01-14 DIAGNOSIS — E1122 Type 2 diabetes mellitus with diabetic chronic kidney disease: Secondary | ICD-10-CM | POA: Diagnosis not present

## 2023-01-14 DIAGNOSIS — N1831 Chronic kidney disease, stage 3a: Secondary | ICD-10-CM | POA: Diagnosis not present

## 2023-01-14 DIAGNOSIS — E1159 Type 2 diabetes mellitus with other circulatory complications: Secondary | ICD-10-CM | POA: Diagnosis not present

## 2023-01-14 DIAGNOSIS — D696 Thrombocytopenia, unspecified: Secondary | ICD-10-CM

## 2023-01-14 DIAGNOSIS — E1169 Type 2 diabetes mellitus with other specified complication: Secondary | ICD-10-CM | POA: Diagnosis not present

## 2023-01-14 LAB — BAYER DCA HB A1C WAIVED: HB A1C (BAYER DCA - WAIVED): 6.9 % — ABNORMAL HIGH (ref 4.8–5.6)

## 2023-01-14 MED ORDER — ESOMEPRAZOLE MAGNESIUM 40 MG PO CPDR: 40.0000 mg | DELAYED_RELEASE_CAPSULE | Freq: Every day | ORAL | 3 refills | Status: DC

## 2023-01-14 MED ORDER — GLIPIZIDE 5 MG PO TABS: ORAL_TABLET | ORAL | Status: DC

## 2023-01-14 MED ORDER — AMLODIPINE BESYLATE 10 MG PO TABS: 10.0000 mg | ORAL_TABLET | Freq: Every day | ORAL | 3 refills | Status: DC

## 2023-01-14 MED ORDER — CARVEDILOL 12.5 MG PO TABS: 12.5000 mg | ORAL_TABLET | Freq: Two times a day (BID) | ORAL | 3 refills | Status: DC

## 2023-01-14 MED ORDER — ENALAPRIL MALEATE 2.5 MG PO TABS: 2.5000 mg | ORAL_TABLET | Freq: Every day | ORAL | 3 refills | Status: DC

## 2023-01-14 NOTE — Patient Instructions (Signed)
Make sure to take your blood pressure medication before our appointment next time.

## 2023-01-14 NOTE — Progress Notes (Signed)
Subjective: CC:Dm PCP: Janora Norlander, DO IZT:[id number] Timothy [name] is a 82 y.o. male presenting to clinic today for:  1. Type 2 Diabetes with hypertension, hyperlipidemia and CKD3a:  Patient reports compliance with his Qtern.  He is also taking glipizide as directed.  Was not able to afford fenofibric acid or Vascepa as the right now just taking his Zetia and Crestor.  Compliant with Norvasc, Coreg, enalapril, Imdur.  Did not take any of these medications this morning because he wanted to be fasting for today's labs.  Reports blood pressures are controlled at home.  Blood sugars have been running around 150s.  He has an occasional 200 but this is after he eats something heavy at nighttime.  No blood sugars less than 70.  Last eye exam: UTD Last foot exam: UTD Last A1c:  Lab Results  Component Value Date   HGBA1C 6.5 (H) 09/13/2022   Nephropathy screen indicated?: needs Last flu, zoster and/or pneumovax:  Immunization History  Administered Date(s) Administered   Fluad Quad(high Dose 65+) 10/18/2020, 10/19/2021, 09/14/2022   Influenza Whole 08/17/2010   Influenza, High Dose Seasonal PF 09/21/2016, 09/25/2017, 09/24/2018   Influenza,inj,Quad PF,6+ Mos 09/28/2013, 10/11/2014, 10/10/2015, 11/06/2019   Moderna Sars-Covid-2 Vaccination 02/15/2020, 03/14/2020, 11/08/2020   Pneumococcal Conjugate-13 10/11/2014   Pneumococcal Polysaccharide-23 06/16/2008   Td 02/15/2003, 09/20/2011   Tdap 09/17/2011   Zoster, Live 05/18/2007    ROS: Denies chest pain, shortness of breath, edema.  No genital symptoms including dysuria, discoloration or irritation.    ROS: Per HPI  Allergies  Allergen Reactions   Hydrocodone-Acetaminophen Other (See Comments)    Makes pt. 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   RIGHT NASAL SIDEWALL-  NO TX   BCC (basal cell carcinoma) 11/15/2004   LEFT CHEST- TX CURET X 3, 5FU   BCC (basal cell carcinoma) 11/15/2004   RIGHT EAR- TX CURET X 3, EXCISION   BCC (basal cell carcinoma) 08/13/2005   RIGHT EAR- TX MOHS   BCC (basal cell carcinoma) 04/01/2008   SUPERFICIAL- UPPER LEFT BACK- TX CURET X 3, 5FU   BCC (basal cell carcinoma) 12/23/2012   ULCERATED- ABOVE RIGHT LIP- TX MOHS   Cataract    Chronic kidney disease    Colon polyp    Coronary artery disease    (left main normal, LAD 25-30% stenosis, distal 30-40% stenosis, circumflex obtuse marginal 50% stenosis,  right coronary artery dominant with long 75% followed by mid 80%  stenosis followed by 90% stenosis at the ostium of the PDA.  He had  stenting of the PDA by Dr. [name].  This was a Cypher stent.   First angioplasty was 1994, Cath 2011 100% RCA ).       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   WELL DIFF- RIGHT TEMPLE-TX CURET X3,5FU   SCC (squamous cell carcinoma) 10/14/2019   WELL DIFF- RIGHT SHOULDER-TX CURET X 3, 5FU   SCC (squamous cell carcinoma) 01/24/2019   WELL DIFF- LEFT INNER CHEEK- MOHS   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   ABOVE LEFT OUTER BROW SUPERIOR- TX CURET X 3, 5FU   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   LEFT FOREHEAD-TX CURET X 3, 5FU   SCCA (squamous cell carcinoma) of skin 04/01/2008   RIGHT HAND- TX CURET X 3, 5FU   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    LEFT SIDE OF FACE- TX CURET AFTER BIOPSY   SCCA (squamous cell carcinoma) of skin 01/12/2021   Neck - anterior (in situ)   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/27/2022   Left Zygomatic Area (in situ) (tx p bx)   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/27/2022   Right Zygomatic Area (well diff)   Sleep apnea    no c-pap   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 Put on hold, Disp: 90 tablet, Rfl: 3   aspirin 81 MG EC tablet, Take 81 mg by mouth daily., Disp: , Rfl:    blood glucose meter kit and supplies, Dispense based on patient and insurance preference. Use up to four times daily as directed. (FOR ICD-10 E11.21), Disp: 1 each, Rfl: 0   carvedilol (COREG) 12.5 MG tablet, Take 1 tablet (12.5 mg total) by mouth 2 (two) times daily with a meal., Disp: 180 tablet, Rfl: 3   Choline Fenofibrate (FENOFIBRIC ACID) 135 MG CPDR, Take 135 mg by mouth daily., Disp: 90 capsule, Rfl: 3   Dapagliflozin-sAXagliptin (QTERN) 10-5 MG TABS, Take 1 tablet by mouth in the morning., Disp: 90 tablet, Rfl: 11   enalapril (VASOTEC) 2.5 MG tablet, Take 1 tablet (2.5 mg total) by mouth daily., Disp: 90 tablet, Rfl: 3   esomeprazole (NEXIUM) 40 MG capsule, Take 1 capsule (40 mg total) by mouth daily. Put on hold, Disp: 90 capsule, Rfl: 3   ezetimibe (ZETIA) 10 MG tablet, Take 1 tablet (10 mg total) by mouth daily., Disp: 90 tablet, Rfl: 3   glipiZIDE (GLUCOTROL) 5 MG tablet, TAKE ONE TABLET ONCE DAILY BEFORE BREAKFAST, Disp: 90 tablet, Rfl: 0    icosapent Ethyl (VASCEPA) 1 g capsule, Take 2 capsules (2 g total) by mouth 2 (two) times daily., Disp: 120 capsule, Rfl: 11   isosorbide mononitrate (IMDUR) 60 MG 24 hr tablet, TAKE 1 TABLET DAILY, Disp: 90 tablet, Rfl: 2   latanoprost (XALATAN) 0.005 % ophthalmic solution, Place 1 drop into both eyes at bedtime., Disp: , Rfl:    nitroGLYCERIN (NITROSTAT) 0.4 MG SL tablet, Place 1 tablet (0.4 mg total) under the tongue every 5 (five) minutes as needed., Disp: 25 tablet, Rfl: 4   predniSONE (STERAPRED UNI-PAK 21 TAB) 10 MG (21) TBPK tablet, Use as directed, Disp: 21 tablet, Rfl: 0  rosuvastatin (CRESTOR) 20 MG tablet, Take 1 tablet (20 mg total) by mouth daily., Disp: 90 tablet, Rfl: 3   tamsulosin (FLOMAX) 0.4 MG CAPS capsule, Take 1 capsule (0.4 mg total) by mouth daily., Disp: 30 capsule, Rfl: 11   Vitamin D, Ergocalciferol, (DRISDOL) 1.25 MG (50000 UNIT) CAPS capsule, Take 1 capsule (50,000 Units total) by mouth every 7 (seven) days., Disp: 5 capsule, Rfl: 0 Social History   Socioeconomic History   Marital status: Married    Spouse name: Charlene   Number of children: 2   Years of education: Not on file   Highest education level: Not on file  Occupational History   Occupation: retired  Tobacco Use   Smoking status: Former    Packs/day: 2.00    Years: 25.00    Total pack years: 50.00    Types: Cigarettes    Quit date: 08/14/1991    Years since quitting: 31.4    Passive exposure: Never   Smokeless tobacco: Never  Vaping Use   Vaping Use: Never used  Substance and Sexual Activity   Alcohol use: Not Currently    Comment: rare   Drug use: No   Sexual activity: Not Currently  Other Topics Concern   Not on file  Social History Narrative   Married x 60 years in 2022.   7 grandchildren.   1 great granddaughter.    Social Determinants of Health   Financial Resource Strain: Low Risk  (11/27/2021)   Overall Financial Resource Strain (CARDIA)    Difficulty of Paying Living  Expenses: Not hard at all  Food Insecurity: No Food Insecurity (11/27/2021)   Hunger Vital Sign    Worried About Running Out of Food in the Last Year: Never true    Ran Out of Food in the Last Year: Never true  Transportation Needs: No Transportation Needs (11/27/2021)   PRAPARE - Hydrologist (Medical): No    Lack of Transportation (Non-Medical): No  Physical Activity: Insufficiently Active (11/27/2021)   Exercise Vital Sign    Days of Exercise per Week: 3 days    Minutes of Exercise per Session: 20 min  Stress: No Stress Concern Present (11/27/2021)   [place]    Feeling of Stress : Not at all  Social Connections: [place] (11/27/2021)   Social Connection and Isolation Panel [NHANES]    Frequency of Communication with Friends and Family: More than three times a week    Frequency of Social Gatherings with Friends and Family: More than three times a week    Attends Religious Services: More than 4 times per year    Active Member of [employer] or Organizations: Yes    Attends Music therapist: More than 4 times per year    Marital Status: Married  Human resources officer Violence: Not At Risk (11/27/2021)   Humiliation, Afraid, Rape, and Kick questionnaire    Fear of Current or Ex-Partner: No    Emotionally Abused: No    Physically Abused: No    Sexually Abused: No   Family History  Problem Relation Age of Onset   Cancer Mother        unsure of type   Glaucoma Mother    Heart disease Father    Coronary artery disease Father    Cancer Sister 52       granlocytosis (Wergerner's)   Colon cancer Brother 68   Nephritis Sister  20's   Cancer Brother        lung     Objective: Office vital signs reviewed. BP (!) 150/84   Pulse 91   Temp 98.5 F (36.9 C)   Ht '5\' 8"'$  (1.727 m)   Wt 182 lb (82.6 kg)   SpO2 94%   BMI 27.67 kg/m   Physical Examination:   General: Awake, alert, well nourished, No acute distress HEENT: sclera white, MMM Cardio: regular rate and rhythm, S1S2 heard, no murmurs appreciated Pulm: clear to auscultation bilaterally, no wheezes, rhonchi or rales; normal work of breathing on room air Extremities: warm, well perfused, No edema, cyanosis or clubbing; +2 pulses bilaterally MSK: normal gait and station  Assessment/ Plan: 82 y.o. male   Type 2 diabetes mellitus with stage 3a chronic kidney disease, without long-term current use of insulin (HCC) - Plan: Bayer DCA Hb A1c Waived, Microalbumin / creatinine urine ratio, Renal Function Panel, CBC, glipiZIDE (GLUCOTROL) 5 MG tablet  Hyperlipidemia associated with type 2 diabetes mellitus ([place])  Hypertension associated with type 2 diabetes mellitus ([place]) - Plan: Renal Function Panel, amLODipine (NORVASC) 10 MG tablet, carvedilol (COREG) 12.5 MG tablet, enalapril (VASOTEC) 2.5 MG tablet  Thrombocytopenia (HCC) - Plan: CBC  Sugar remains under good control with A1c of 6.9 today.  Urine microalbumin collected.  He will remain on Farxiga.  I see that that has been ordered through the patient assistance program but I do not see where Onglyza has been ordered.  I will reach out to clinical pharmacy to check in on this.  He has at least 4 months of Qtern left and has already received his [place] sample.  Not due for fasting lipid  Check renal function panel.  Medications have been renewed for hypertension.  I have instructed him to make sure to take his hypertension medications prior to next visit so as to ensure accurate blood pressure reading as it was still elevated upon discharge today  Check CBC given known thrombocytopenia  No orders of the defined types were placed in this encounter.  No orders of the defined types were placed in this encounter.    Janora Norlander, DO [place] [phone]

## 2023-01-15 LAB — RENAL FUNCTION PANEL
Albumin: 4.4 g/dL (ref 3.7–4.7)
BUN/Creatinine Ratio: 16 (ref 10–24)
BUN: 21 mg/dL (ref 8–27)
CO2: 20 mmol/L (ref 20–29)
Calcium: 9.5 mg/dL (ref 8.6–10.2)
Chloride: 102 mmol/L (ref 96–106)
Creatinine, Ser: 1.28 mg/dL — ABNORMAL HIGH (ref 0.76–1.27)
Glucose: 164 mg/dL — ABNORMAL HIGH (ref 70–99)
Phosphorus: 4.3 mg/dL — ABNORMAL HIGH (ref 2.8–4.1)
Potassium: 4.6 mmol/L (ref 3.5–5.2)
Sodium: 137 mmol/L (ref 134–144)
eGFR: 56 mL/min/{1.73_m2} — ABNORMAL LOW (ref >59)

## 2023-01-15 LAB — CBC
Hematocrit: 42.3 % (ref 37.5–51.0)
Hemoglobin: 14 g/dL (ref 13.0–17.7)
MCH: 29.2 pg (ref 26.6–33.0)
MCHC: 33.1 g/dL (ref 31.5–35.7)
MCV: 88 fL (ref 79–97)
Platelets: 157 10*3/uL (ref 150–450)
RBC: 4.8 x10E6/uL (ref 4.14–5.80)
RDW: 13.1 % (ref 11.6–15.4)
WBC: 6.2 10*3/uL (ref 3.4–10.8)

## 2023-01-16 LAB — MICROALBUMIN / CREATININE URINE RATIO
Creatinine, Urine: 53.3 mg/dL
Microalb/Creat Ratio: 89 mg/g creat — ABNORMAL HIGH (ref 0–29)
Microalbumin, Urine: 47.4 ug/mL

## 2023-01-22 ENCOUNTER — Ambulatory Visit: Payer: Self-pay | Admitting: Radiation Oncology

## 2023-01-25 NOTE — Telephone Encounter (Signed)
Yes--unfortunately, Timothy [name] and Onglyza have been discontinued from AZ&me free program.  We are still able to get Wilder Glade (which was part of Timothy [name]) if you are okay with this!  Will update med list once patient is switched.  Thanks!

## 2023-01-28 NOTE — Telephone Encounter (Signed)
I'm ok with it if he is.

## 2023-02-05 ENCOUNTER — Telehealth: Payer: Self-pay | Admitting: Family Medicine

## 2023-02-05 NOTE — Telephone Encounter (Signed)
Contacted Timothy [name] to schedule their annual wellness visit. Patient declined to schedule AWV at this time.  Patient feels it is a waste of time and all we want is his insurance's money. Tried to explain to him that it free for him and that Medicare is wanting it completed  Thank you,  Colletta [place],  [place] ??[id number]

## 2023-03-28 DIAGNOSIS — Z85828 Personal history of other malignant neoplasm of skin: Secondary | ICD-10-CM | POA: Diagnosis not present

## 2023-03-28 DIAGNOSIS — L538 Other specified erythematous conditions: Secondary | ICD-10-CM | POA: Diagnosis not present

## 2023-03-28 DIAGNOSIS — D1801 Hemangioma of skin and subcutaneous tissue: Secondary | ICD-10-CM | POA: Diagnosis not present

## 2023-03-28 DIAGNOSIS — Z85820 Personal history of malignant melanoma of skin: Secondary | ICD-10-CM | POA: Diagnosis not present

## 2023-03-28 DIAGNOSIS — Z08 Encounter for follow-up examination after completed treatment for malignant neoplasm: Secondary | ICD-10-CM | POA: Diagnosis not present

## 2023-03-28 DIAGNOSIS — L814 Other melanin hyperpigmentation: Secondary | ICD-10-CM | POA: Diagnosis not present

## 2023-03-28 DIAGNOSIS — L821 Other seborrheic keratosis: Secondary | ICD-10-CM | POA: Diagnosis not present

## 2023-03-28 DIAGNOSIS — L218 Other seborrheic dermatitis: Secondary | ICD-10-CM | POA: Diagnosis not present

## 2023-03-28 DIAGNOSIS — L820 Inflamed seborrheic keratosis: Secondary | ICD-10-CM | POA: Diagnosis not present

## 2023-04-18 ENCOUNTER — Ambulatory Visit: Payer: Medicare HMO | Admitting: Urology

## 2023-04-18 ENCOUNTER — Encounter: Payer: Self-pay | Admitting: Urology

## 2023-04-18 VITALS — BP 147/77 | HR 76

## 2023-04-18 DIAGNOSIS — N401 Enlarged prostate with lower urinary tract symptoms: Secondary | ICD-10-CM | POA: Diagnosis not present

## 2023-04-18 DIAGNOSIS — N481 Balanitis: Secondary | ICD-10-CM | POA: Diagnosis not present

## 2023-04-18 DIAGNOSIS — R339 Retention of urine, unspecified: Secondary | ICD-10-CM | POA: Diagnosis not present

## 2023-04-18 DIAGNOSIS — N138 Other obstructive and reflux uropathy: Secondary | ICD-10-CM

## 2023-04-18 DIAGNOSIS — N3941 Urge incontinence: Secondary | ICD-10-CM | POA: Diagnosis not present

## 2023-04-18 DIAGNOSIS — N3281 Overactive bladder: Secondary | ICD-10-CM | POA: Diagnosis not present

## 2023-04-18 LAB — URINALYSIS, ROUTINE W REFLEX MICROSCOPIC
Bilirubin, UA: NEGATIVE
Ketones, UA: NEGATIVE
Leukocytes,UA: NEGATIVE
Nitrite, UA: NEGATIVE
RBC, UA: NEGATIVE
Specific Gravity, UA: 1.015 (ref 1.005–1.030)
Urobilinogen, Ur: 0.2 mg/dL (ref 0.2–1.0)
pH, UA: 5.5 (ref 5.0–7.5)

## 2023-04-18 LAB — BLADDER SCAN AMB NON-IMAGING: Scan Result: 19

## 2023-04-18 LAB — MICROSCOPIC EXAMINATION
Bacteria, UA: NONE SEEN
RBC, Urine: NONE SEEN /hpf (ref 0–2)

## 2023-04-18 MED ORDER — TAMSULOSIN HCL 0.4 MG PO CAPS: 0.4000 mg | ORAL_CAPSULE | Freq: Every day | ORAL | 3 refills | Status: DC

## 2023-04-18 MED ORDER — NYSTATIN 100000 UNIT/GM EX CREA: 1.0000 | TOPICAL_CREAM | Freq: Two times a day (BID) | CUTANEOUS | 2 refills | Status: DC

## 2023-04-18 NOTE — Progress Notes (Signed)
Subjective: 1. BPH with urinary obstruction   2. Incomplete bladder emptying   3. Urge incontinence   4. Detrusor instability   5. Balanitis    04/18/23: Hanley returns today in f/u.  He is on tamsulosin for his BPH with BOO and OAB wet.  His IPSS is 12 with nocturia x 1.  He has urgency with mild but frequent incontinence with urgency.  It is worse after sitting for a while. He has had no hematuria or dysuria. He has some penile irritation that he attributes to one of his meds, Colbert Coyer, which is going to be changed at the end of the month back to [place]. His PVR is 19ml and his UA is clear.   10/18/22: Zebbie returns today in f/u for his history of BPH with BOO and OAB wet.  He has been on silodosin.  He has a history of a fossa stricture that was dilated at cystoscopy in 11/21.  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  He had some increased voiding symptoms while getting the RTx.   His IPSS is 14 with frequency and urgency but nocturia only 1x.   He has occasion incontinence but it is insensible so he wears a single depend a day.  His PVR is 1ml.   His UA is clear but he has 3+ glucose.    08/31/21: Tywone returns today in f/u.   He remains on silodosin.  His IPSS is 11 with urgency and frequency.  His PVR is 14ml.  He has had no hematuria or dysuria.   12/29/20: Marvens returns today in f/u to assess his response the silodosin.  He is voiding better with reduce incontinence and has nocturia x 1.  His PVR today is 34ml.  He reports a good stream most of the time since the dilation of the fossa stricture.  His UA is unremarkable today.   His IPSS is 20, but his history is not consistent with the score.  He feels he empties well.  His daytime frequency is more often than every 2 hours.  He has still has some urgency.    10/28/20: Vito returns today in f/u from urodynamics for consideration of cystoscopy.   He continues to have significant LUTS on tamsulosin.  UA is  clear today.  His IPSS is 19.  UDS results:  He had a Max capacity of with a first sensation at and a strong urge at with instability.   He generated a detrusor contraction but couldn't void with the tube in place.  He voided with a PF of 48ml/sec and a PVR of after the line was removed.  He had some increased EMG activity with voiding.   Mr. Vallejo is a 82 yo male who is sent in consultation by Dr. Nadine Counts for BPH with LUTS.  He was previously seen by Dr. Sherryl Barters in 2018 for incontinence.  He continues to have issues with incontinence.  When he gets the urge he will leak.  He has some insensible incontinence as well and wears depends.  He doesn't think he has SUI.  He has an adequate stream and generally feels he empties well.  He has no intermittency.  He has minimal nocturia but only sleeps about 5 hours nightly.    He has no hematuria or dysuria.  He has had no GU surgery.  He has had one prior UTI a couple of months ago.  He has no history  of stones.   He is currently on tamsulosin.  His PSA was 0.2 on 06/27/20 and that is stable for the last 6 years.  His last testosterone level was from 9/14 and was 488.  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  He had a CT in 2019 that showed no renal issues.  The prostate was not very enlarged with a transverse diameter of 4.2cm. His PVR today is .      IPSS     Row Name 04/18/23 0900         International Prostate Symptom Score   How often have you had the sensation of not emptying your bladder? Less than half the time     How often have you had to urinate less than every two hours? About half the time     How often have you found you stopped and started again several times when you urinated? Not at All     How often have you found it difficult to postpone urination? Almost always     How often have you had a weak urinary stream? Less than 1 in 5 times     How often have you had to strain to start urination? Not at All      How many times did you typically get up at night to urinate? 1 Time     Total IPSS Score 12       Quality of Life due to urinary symptoms   If you were to spend the rest of your life with your urinary condition just the way it is now how would you feel about that? Pleased                ROS:  Review of Systems  Genitourinary:  Positive for urgency.  All other systems reviewed and are negative.   Allergies  Allergen Reactions   Hydrocodone-Acetaminophen Other (See Comments)    Makes pt. 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   RIGHT NASAL SIDEWALL- NO TX   BCC (basal cell carcinoma) 11/15/2004   LEFT CHEST- TX CURET X 3, 5FU   BCC (basal cell carcinoma) 11/15/2004   RIGHT EAR- TX CURET X 3, EXCISION   BCC (basal cell carcinoma) 08/13/2005   RIGHT EAR- TX MOHS   BCC (basal cell carcinoma) 04/01/2008   SUPERFICIAL- UPPER LEFT BACK- TX CURET X 3, 5FU   BCC (basal cell carcinoma) 12/23/2012   ULCERATED- ABOVE RIGHT LIP- TX MOHS   Cataract    Chronic kidney disease    Colon polyp    Coronary artery disease    (left main normal, LAD 25-30% stenosis, distal 30-40% stenosis, circumflex obtuse marginal 50% stenosis,  right coronary artery dominant with long 75% followed by mid 80%  stenosis followed by 90% stenosis at the ostium of the PDA.  He had  stenting of the PDA by Dr. Riley Kill.  This was a Cypher stent.   First angioplasty was 1994, Cath 2011 100% RCA ).       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    Melanoma (HCC) 12/23/2012  IN SITU- LEFT SIDEBURN-TX MOHS   Melanoma (HCC) 02/27/2017   LEFT CHEEK- TX MOHS   Myocardial infarction (HCC)     per pt/ had mild heart attacks/has 3 stents   OSA (obstructive sleep apnea) 08/17/2016   SCC (squamous cell carcinoma) 11/16/2015   LEFT EAR RIM- TX EXCISION   SCC (squamous cell carcinoma) 02/27/2017   WELL DIFF- RIGHT TEMPLE-TX CURET X3,5FU   SCC (squamous cell carcinoma) 10/14/2019   WELL DIFF- RIGHT SHOULDER-TX CURET X 3, 5FU   SCC (squamous cell carcinoma) 01/24/2019   WELL DIFF- LEFT INNER CHEEK- MOHS   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   ABOVE LEFT OUTER BROW SUPERIOR- TX CURET X 3, 5FU   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   LEFT FOREHEAD-TX CURET X 3, 5FU   SCCA (squamous cell carcinoma) of skin 04/01/2008   RIGHT HAND- TX CURET X 3, 5FU   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   LEFT SIDE OF FACE- TX CURET AFTER BIOPSY   SCCA (squamous cell carcinoma) of skin 01/12/2021   Neck - anterior (in situ)   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/27/2022   Left Zygomatic Area (in situ) (tx p bx)   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/27/2022   Right Zygomatic Area (well diff)   Sleep apnea    no c-pap   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   in situ- dorsum of nose (CX35FU)    Past Surgical History:  Procedure Laterality Date   APPENDECTOMY     CERVICAL SPINE SURGERY     CHOLECYSTECTOMY     COLONOSCOPY  2004   multiple since   CORONARY ANGIOPLASTY WITH STENT PLACEMENT     3 sents   EYE SURGERY Bilateral    cataracts   HERNIA REPAIR Bilateral    MOHS SURGERY Right 12/2021   SKIN CANCER EXCISION  03/17/2017   SKIN CANCER EXCISION  03/17/2020   left side of face    Social History   Socioeconomic History   Marital status: Married    Spouse name: Engineer, maintenance (IT)   Number of children: 2   Years of education: Not on file   Highest education level: Not on file  Occupational History   Occupation: retired  Tobacco Use   Smoking status: Former    Packs/day: 2.00    Years: 25.00    Additional pack years: 0.00     Total pack years: 50.00    Types: Cigarettes    Quit date: 08/14/1991    Years since quitting: 31.7    Passive exposure: Never   Smokeless tobacco: Never  Vaping Use   Vaping Use: Never used  Substance and Sexual Activity   Alcohol use: Not Currently    Comment: rare   Drug use: No   Sexual activity: Not Currently  Other Topics Concern   Not on file  Social History Narrative   Married x 60 years in 2022.   7 grandchildren.   1 great granddaughter.    Social Determinants of Health   Financial Resource Strain: Low Risk  (11/27/2021)   Overall Financial Resource Strain (CARDIA)    Difficulty of Paying Living Expenses: Not hard at all  Food Insecurity: No Food Insecurity (11/27/2021)   Hunger Vital Sign    Worried About Running Out of Food in the Last Year: Never true    Ran Out of Food in the Last Year: Never true  Transportation Needs: No Transportation Needs (  11/27/2021)   PRAPARE - Administrator, Civil Service (Medical): No    Lack of Transportation (Non-Medical): No  Physical Activity: Insufficiently Active (11/27/2021)   Exercise Vital Sign    Days of Exercise per Week: 3 days    Minutes of Exercise per Session: 20 min  Stress: No Stress Concern Present (11/27/2021)   [employer] of Occupational Health - Occupational Stress Questionnaire    Feeling of Stress : Not at all  Social Connections: Socially Integrated (11/27/2021)   Social Connection and Isolation Panel [NHANES]    Frequency of Communication with Friends and Family: More than three times a week    Frequency of Social Gatherings with Friends and Family: More than three times a week    Attends Religious Services: More than 4 times per year    Active Member of [employer] or Organizations: Yes    Attends Banker Meetings: More than 4 times per year    Marital Status: Married  Catering manager Violence: Not At Risk (11/27/2021)   Humiliation, Afraid, Rape, and Kick questionnaire     Fear of Current or Ex-Partner: No    Emotionally Abused: No    Physically Abused: No    Sexually Abused: No    Family History  Problem Relation Age of Onset   Cancer Mother        unsure of type   Glaucoma Mother    Heart disease Father    Coronary artery disease Father    Cancer Sister 52       granlocytosis (Wergerner's)   Colon cancer Brother 81   Nephritis Sister        20's   Cancer Brother        lung     Anti-infectives: Anti-infectives (From admission, onward)    None       Current Outpatient Medications  Medication Sig Dispense Refill   Accu-Chek Softclix Lancets lancets Check BS 4 times daily E11.21 100 each 11   amLODipine (NORVASC) 10 MG tablet Take 1 tablet (10 mg total) by mouth daily. Put on hold 90 tablet 3   aspirin 81 MG EC tablet Take 81 mg by mouth daily.     blood glucose meter kit and supplies Dispense based on patient and insurance preference. Use up to four times daily as directed. (FOR ICD-10 E11.21) 1 each 0   Blood Glucose Monitoring Suppl (ACCU-CHEK GUIDE ME) w/Device KIT Check BS 4 times daily E11.21 1 kit 0   carvedilol (COREG) 12.5 MG tablet Take 1 tablet (12.5 mg total) by mouth 2 (two) times daily with a meal. 180 tablet 3   dapagliflozin propanediol (FARXIGA) 5 MG TABS tablet Take 5 mg by mouth daily.     enalapril (VASOTEC) 2.5 MG tablet Take 1 tablet (2.5 mg total) by mouth daily. 90 tablet 3   esomeprazole (NEXIUM) 40 MG capsule Take 1 capsule (40 mg total) by mouth daily. Put on hold 90 capsule 3   ezetimibe (ZETIA) 10 MG tablet Take 1 tablet (10 mg total) by mouth daily. 90 tablet 3   glipiZIDE (GLUCOTROL) 5 MG tablet TAKE ONE TABLET ONCE DAILY BEFORE BREAKFAST 90 tablet 03   glucose blood (ACCU-CHEK GUIDE) test strip Check BS 4 times daily E11.21 100 each 11   isosorbide mononitrate (IMDUR) 60 MG 24 hr tablet TAKE 1 TABLET DAILY 90 tablet 2   latanoprost (XALATAN) 0.005 % ophthalmic solution Place 1 drop into both eyes at bedtime.  nitroGLYCERIN (NITROSTAT) 0.4 MG SL tablet Place 1 tablet (0.4 mg total) under the tongue every 5 (five) minutes as needed. 25 tablet 4   nystatin cream (MYCOSTATIN) Apply 1 Application topically 2 (two) times daily. Apply sparingly to the irritated area 2 x daily. 30 g 2   rosuvastatin (CRESTOR) 20 MG tablet Take 1 tablet (20 mg total) by mouth daily. 90 tablet 3   Dapagliflozin-sAXagliptin (QTERN) 10-5 MG TABS Take 1 tablet by mouth in the morning. (Patient not taking: Reported on 04/18/2023) 90 tablet 11   tamsulosin (FLOMAX) 0.4 MG CAPS capsule Take 1 capsule (0.4 mg total) by mouth daily. 90 capsule 3   No current facility-administered medications for this visit.     Objective: Vital signs in last 24 hours: BP (!) 147/77   Pulse 76   Intake/Output from previous day: No intake/output data recorded. Intake/Output this shift: @IOTHISSHIFT @   Physical Exam Vitals reviewed.  Constitutional:      Appearance: Normal appearance.  Genitourinary:    Comments: Circumcised with erythema on the lateral and posterior coronal sulcus.   Neurological:     Mental Status: He is alert.     Lab Results:  Results for orders placed or performed in visit on 04/18/23 (from the past 24 hour(s))  Urinalysis, Routine w reflex microscopic     Status: Abnormal   Collection Time: 04/18/23  9:43 AM  Result Value Ref Range   Specific Gravity, UA 1.015 1.005 - 1.030   pH, UA 5.5 5.0 - 7.5   Color, UA Yellow Yellow   Appearance Ur Clear Clear   Leukocytes,UA Negative Negative   Protein,UA 1+ (A) Negative/Trace   Glucose, UA 3+ (A) Negative   Ketones, UA Negative Negative   RBC, UA Negative Negative   Bilirubin, UA Negative Negative   Urobilinogen, Ur 0.2 0.2 - 1.0 mg/dL   Nitrite, UA Negative Negative   Microscopic Examination See below:    Narrative   Performed at:  [address] - [facility] [address], [place], [place]  [id number] Lab Director: Chinita Pester MT, Phone:  [phone]   Microscopic Examination     Status: None   Collection Time: 04/18/23  9:43 AM   Urine  Result Value Ref Range   WBC, UA 0-5 0 - 5 /hpf   RBC, Urine None seen 0 - 2 /hpf   Epithelial Cells (non renal) 0-10 0 - 10 /hpf   Bacteria, UA None seen None seen/Few   Narrative   Performed at:  [address] - [facility] [address] [place], [place]  [id number] Lab Director: Chinita Pester MT, Phone:  [phone]      BMET No results for input(s): "NA", "K", "CL", "CO2", "GLUCOSE", "BUN", "CREATININE", "CALCIUM" in the last 72 hours. PT/INR No results for input(s): "LABPROT", "INR" in the last 72 hours. ABG No results for input(s): "PHART", "HCO3" in the last 72 hours.  Invalid input(s): "PCO2", "PO2" UA is clear.  Studies/Results:   Assessment/Plan: BPH with BOO and UUI with incomplete emptying.  He is doing ok on tamsulosin.   Balanitis.  I will give him nystatin cream.   Urethral stricture of the fossa.  This was dilated for scope passage.  He continues to have a good stream.     Meds ordered this encounter  Medications   tamsulosin (FLOMAX) 0.4 MG CAPS capsule    Sig: Take 1 capsule (0.4 mg total) by mouth daily.    Dispense:  90 capsule    Refill:  3   nystatin cream (MYCOSTATIN)    Sig: Apply 1 Application topically 2 (two) times daily. Apply sparingly to the irritated area 2 x daily.    Dispense:  30 g    Refill:  2      Orders Placed This Encounter  Procedures   Microscopic Examination   Urinalysis, Routine w reflex microscopic   BLADDER SCAN AMB NON-IMAGING     Return in about 1 year (around 04/17/2024).  CC: Dr. Doylene Canard.       Bjorn Pippin 04/19/2023 [phone] ID: Marcelline Mates, male   DOB: Jan 19, 1941, 82 y.o.   MRN: [id number] Patient ID: Selvin Graney, male   DOB: 1941-09-10, 82 y.o.   MRN: [id number]

## 2023-04-18 NOTE — Progress Notes (Signed)
post void residual=19 

## 2023-05-15 ENCOUNTER — Ambulatory Visit (INDEPENDENT_AMBULATORY_CARE_PROVIDER_SITE_OTHER): Payer: Medicare HMO | Admitting: Family Medicine

## 2023-05-15 ENCOUNTER — Encounter: Payer: Self-pay | Admitting: Family Medicine

## 2023-05-15 VITALS — BP 158/72 | HR 70 | Temp 98.3°F | Ht 68.0 in | Wt 187.0 lb

## 2023-05-15 DIAGNOSIS — E785 Hyperlipidemia, unspecified: Secondary | ICD-10-CM

## 2023-05-15 DIAGNOSIS — N1831 Chronic kidney disease, stage 3a: Secondary | ICD-10-CM

## 2023-05-15 DIAGNOSIS — Z7984 Long term (current) use of oral hypoglycemic drugs: Secondary | ICD-10-CM | POA: Diagnosis not present

## 2023-05-15 DIAGNOSIS — I152 Hypertension secondary to endocrine disorders: Secondary | ICD-10-CM | POA: Diagnosis not present

## 2023-05-15 DIAGNOSIS — E1169 Type 2 diabetes mellitus with other specified complication: Secondary | ICD-10-CM | POA: Diagnosis not present

## 2023-05-15 DIAGNOSIS — E1122 Type 2 diabetes mellitus with diabetic chronic kidney disease: Secondary | ICD-10-CM | POA: Diagnosis not present

## 2023-05-15 DIAGNOSIS — E1159 Type 2 diabetes mellitus with other circulatory complications: Secondary | ICD-10-CM | POA: Diagnosis not present

## 2023-05-15 LAB — RENAL FUNCTION PANEL
Creatinine, Ser: 1.18 mg/dL (ref 0.76–1.27)
Potassium: 3.6 mmol/L (ref 3.5–5.2)
Sodium: 138 mmol/L (ref 134–144)

## 2023-05-15 LAB — LIPID PANEL: VLDL Cholesterol Cal: 55 mg/dL — ABNORMAL HIGH (ref 5–40)

## 2023-05-15 LAB — BAYER DCA HB A1C WAIVED: HB A1C (BAYER DCA - WAIVED): 6.5 % — ABNORMAL HIGH (ref 4.8–5.6)

## 2023-05-15 NOTE — Progress Notes (Signed)
Subjective: CC:DM PCP: [name], DO Timothy [name] is a 82 y.o. male presenting to clinic today for:  1. Type 2 Diabetes with hypertension, hyperlipidemia w/ CKD3a:  Patient reports compliance with medications.  His a.m. blood sugars been running around 150s.  No hypoglycemic episodes.  He is compliant with glipizide, Farxiga, Norvasc, Crestor and Coreg.  Also on Zetia and enalapril  Last eye exam: UTD Last foot exam: needs Last A1c:  Lab Results  Component Value Date   HGBA1C 6.9 (H) 01/14/2023   Nephropathy screen indicated?: UTD Last flu, zoster and/or pneumovax:  Immunization History  Administered Date(s) Administered   Fluad Quad(high Dose 65+) 10/18/2020, 10/19/2021, 09/14/2022   Influenza Whole 08/17/2010   Influenza, High Dose Seasonal PF 09/21/2016, 09/25/2017, 09/24/2018   Influenza,inj,Quad PF,6+ Mos 09/28/2013, 10/11/2014, 10/10/2015, 11/06/2019   Moderna Sars-Covid-2 Vaccination 02/15/2020, 03/14/2020, 11/08/2020   Pneumococcal Conjugate-13 10/11/2014   Pneumococcal Polysaccharide-23 06/16/2008   Td 02/15/2003, 09/20/2011   Tdap 09/17/2011   Zoster, Live 05/18/2007    ROS: Denies dizziness, chest pain, shortness of breath, dysuria, hematuria, penile pain or discharge   ROS: Per HPI  Allergies  Allergen Reactions   Hydrocodone-Acetaminophen Other (See Comments)    Makes pt. 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   RIGHT NASAL SIDEWALL- NO TX   BCC (basal cell carcinoma) 11/15/2004   LEFT CHEST- TX CURET X 3, 5FU   BCC (basal cell carcinoma) 11/15/2004   RIGHT EAR- TX CURET X 3, EXCISION   BCC (basal cell carcinoma) 08/13/2005   RIGHT EAR- TX MOHS   BCC (basal cell carcinoma) 04/01/2008   SUPERFICIAL- UPPER LEFT BACK- TX CURET X 3, 5FU   BCC (basal cell carcinoma)  12/23/2012   ULCERATED- ABOVE RIGHT LIP- TX MOHS   Cataract    Chronic kidney disease    Colon polyp    Coronary artery disease    (left main normal, LAD 25-30% stenosis, distal 30-40% stenosis, circumflex obtuse marginal 50% stenosis,  right coronary artery dominant with long 75% followed by mid 80%  stenosis followed by 90% stenosis at the ostium of the PDA.  He had  stenting of the PDA by Dr. Riley Kill.  This was a Cypher stent.   First angioplasty was 1994, Cath 2011 100% RCA ).       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   WELL DIFF- RIGHT TEMPLE-TX CURET X3,5FU   SCC (squamous cell carcinoma) 10/14/2019   WELL DIFF- RIGHT SHOULDER-TX CURET X 3, 5FU   SCC (squamous cell carcinoma) 01/24/2019   WELL DIFF- LEFT INNER CHEEK- MOHS   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   ABOVE LEFT OUTER BROW SUPERIOR- TX CURET X 3, 5FU  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   LEFT FOREHEAD-TX CURET X 3, 5FU   SCCA (squamous cell carcinoma) of skin 04/01/2008   RIGHT HAND- TX CURET X 3, 5FU   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   LEFT SIDE OF FACE- TX CURET AFTER BIOPSY   SCCA (squamous cell carcinoma) of skin 01/12/2021   Neck - anterior (in situ)   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/27/2022   Left Zygomatic Area (in situ) (tx p bx)   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/27/2022   Right Zygomatic Area (well diff)   Sleep apnea    no c-pap    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 Put on hold, Disp: 90 tablet, Rfl: 3   aspirin 81 MG EC tablet, Take 81 mg by mouth daily., Disp: , Rfl:    blood glucose meter kit and supplies, Dispense based on patient and insurance preference. Use up to four times daily as directed. (FOR ICD-10 E11.21), Disp: 1 each, Rfl: 0   Blood Glucose Monitoring Suppl (ACCU-CHEK GUIDE ME) w/Device KIT, Check BS 4 times daily E11.21, Disp: 1 kit, Rfl: 0   carvedilol (COREG) 12.5 MG tablet, Take 1 tablet (12.5 mg total) by mouth 2 (two) times daily with a meal., Disp: 180 tablet, Rfl: 3   dapagliflozin propanediol (FARXIGA) 5 MG TABS tablet, Take 5 mg by mouth daily., Disp: , Rfl:    Dapagliflozin-sAXagliptin (QTERN) 10-5 MG TABS, Take 1 tablet by mouth in the morning. (Patient not taking: Reported on 04/18/2023), Disp: 90 tablet, Rfl: 11   enalapril (VASOTEC) 2.5 MG tablet, Take 1 tablet (2.5 mg total) by mouth daily., Disp: 90 tablet, Rfl: 3   esomeprazole (NEXIUM) 40 MG capsule, Take 1 capsule (40 mg total) by mouth daily. Put on hold, Disp: 90 capsule, Rfl: 3   ezetimibe (ZETIA) 10 MG tablet, Take 1 tablet (10 mg total) by mouth daily., Disp: 90 tablet, Rfl: 3   glipiZIDE (GLUCOTROL) 5 MG tablet, TAKE ONE TABLET ONCE DAILY BEFORE BREAKFAST, Disp: 90 tablet, Rfl: 03   glucose blood (ACCU-CHEK GUIDE) test strip, Check BS 4 times daily E11.21, Disp: 100 each, Rfl: 11   isosorbide mononitrate (IMDUR) 60 MG 24 hr tablet, TAKE 1 TABLET DAILY, Disp: 90 tablet, Rfl: 2   latanoprost (XALATAN) 0.005 % ophthalmic solution, Place 1 drop into both eyes at bedtime., Disp: , Rfl:    nitroGLYCERIN (NITROSTAT) 0.4 MG SL tablet, Place 1 tablet (0.4 mg total) under the tongue every 5  (five) minutes as needed., Disp: 25 tablet, Rfl: 4   nystatin cream (MYCOSTATIN), Apply 1 Application topically 2 (two) times daily. Apply sparingly to the irritated area 2 x daily., Disp: 30 g, Rfl: 2   rosuvastatin (CRESTOR) 20 MG tablet, Take 1 tablet (20 mg total) by mouth daily., Disp: 90 tablet, Rfl: 3   tamsulosin (FLOMAX) 0.4 MG CAPS capsule, Take 1 capsule (0.4 mg total) by mouth daily., Disp: 90 capsule, Rfl: 3 Social History   Socioeconomic History   Marital status: Married    Spouse name: Charlene   Number of children: 2   Years of education: Not on file   Highest education level: Not on file  Occupational History   Occupation: retired  Tobacco Use   Smoking status: Former    Packs/day: 2.00    Years: 25.00    Additional pack years: 0.00    Total pack years: 50.00    Types: Cigarettes    Quit date: 08/14/1991    Years since quitting: 31.7    Passive exposure: Never   Smokeless tobacco: Never  Vaping Use   Vaping Use: Never used  Substance and Sexual Activity   Alcohol use: Not Currently    Comment: rare   Drug use: No   Sexual activity: Not Currently  Other Topics Concern   Not on file  Social History Narrative   Married x 60 years in 2022.   7 grandchildren.   1 great granddaughter.    Social Determinants of Health   Financial Resource Strain: Low Risk  (11/27/2021)   Overall Financial Resource Strain (CARDIA)    Difficulty of Paying Living Expenses: Not hard at all  Food Insecurity: No Food Insecurity (11/27/2021)   Hunger Vital Sign    Worried About Running Out of Food in the Last Year: Never true    Ran Out of Food in the Last Year: Never true  Transportation Needs: No Transportation Needs (11/27/2021)   PRAPARE - Administrator, Civil Service (Medical): No    Lack of Transportation (Non-Medical): No  Physical Activity: Insufficiently Active (11/27/2021)   Exercise Vital Sign    Days of Exercise per Week: 3 days    Minutes of Exercise  per Session: 20 min  Stress: No Stress Concern Present (11/27/2021)   [employer] of Occupational Health - Occupational Stress Questionnaire    Feeling of Stress : Not at all  Social Connections: Socially Integrated (11/27/2021)   Social Connection and Isolation Panel [NHANES]    Frequency of Communication with Friends and Family: More than three times a week    Frequency of Social Gatherings with Friends and Family: More than three times a week    Attends Religious Services: More than 4 times per year    Active Member of [employer] or Organizations: Yes    Attends Engineer, structural: More than 4 times per year    Marital Status: Married  Catering manager Violence: Not At Risk (11/27/2021)   Humiliation, Afraid, Rape, and Kick questionnaire    Fear of Current or Ex-Partner: No    Emotionally Abused: No    Physically Abused: No    Sexually Abused: No   Family History  Problem Relation Age of Onset   Cancer Mother        unsure of type   Glaucoma Mother    Heart disease Father    Coronary artery disease Father    Cancer Sister 11       granlocytosis (Wergerner's)   Colon cancer Brother 47   Nephritis Sister        20's   Cancer Brother        lung     Objective: Office vital signs reviewed. BP (!) 158/72   Pulse 70   Temp 98.3 F (36.8 C)   Ht 5\' 8"  (1.727 m)   Wt 187 lb (84.8 kg)   SpO2 97%   BMI 28.43 kg/m   Physical Examination:  General: Awake, alert, well nourished, No acute distress HEENT: sclera white, MMM Cardio: regular rate and rhythm, S1S2 heard, no murmurs appreciated Pulm: clear to auscultation bilaterally, no wheezes, rhonchi or rales; normal work of breathing  on room air Extremities: warm, well perfused, No edema, cyanosis or clubbing; +2 pulses bilaterally Neuro: see dm foot  Diabetic Foot Exam - Simple   Simple Foot Form Diabetic Foot exam was performed with the following findings: Yes 05/15/2023  4:49 PM  Visual Inspection No  deformities, no ulcerations, no other skin breakdown bilaterally: Yes Sensation Testing Intact to touch and monofilament testing bilaterally: Yes Pulse Check Posterior Tibialis and Dorsalis pulse intact bilaterally: Yes Comments      Assessment/ Plan: 82 y.o. male   Type 2 diabetes mellitus with stage 3a chronic kidney disease, without long-term current use of insulin (HCC) - Plan: Bayer DCA Hb A1c Waived, Renal Function Panel  Hyperlipidemia associated with type 2 diabetes mellitus (HCC) - Plan: Lipid Panel  Hypertension associated with type 2 diabetes mellitus (HCC) - Plan: Renal Function Panel  Sugar under good control.  No changes needed.  Check renal function  Check fasting lipid panel.  Blood pressure not at goal.  He will monitor blood pressures for the next 2 weeks and return for recheck blood pressure with nurse.  If persistently elevated, we will plan to advance his enalapril to 5 mg daily  No orders of the defined types were placed in this encounter.  No orders of the defined types were placed in this encounter.    [name], DO [facility] [phone]

## 2023-05-16 LAB — RENAL FUNCTION PANEL
Albumin: 4.2 g/dL (ref 3.7–4.7)
BUN/Creatinine Ratio: 14 (ref 10–24)
BUN: 16 mg/dL (ref 8–27)
CO2: 21 mmol/L (ref 20–29)
Calcium: 8.9 mg/dL (ref 8.6–10.2)
Chloride: 103 mmol/L (ref 96–106)
Glucose: 167 mg/dL — ABNORMAL HIGH (ref 70–99)
Phosphorus: 3.1 mg/dL (ref 2.8–4.1)
eGFR: 62 mL/min/{1.73_m2} (ref >59)

## 2023-05-16 LAB — LIPID PANEL
Chol/HDL Ratio: 5.9 ratio — ABNORMAL HIGH (ref 0.0–5.0)
Cholesterol, Total: 170 mg/dL (ref 100–199)
HDL: 29 mg/dL — ABNORMAL LOW (ref >39)
LDL Chol Calc (NIH): 86 mg/dL (ref 0–99)
Triglycerides: 334 mg/dL — ABNORMAL HIGH (ref 0–149)

## 2023-05-20 ENCOUNTER — Other Ambulatory Visit: Payer: Self-pay | Admitting: Internal Medicine

## 2023-06-12 DIAGNOSIS — Z961 Presence of intraocular lens: Secondary | ICD-10-CM | POA: Diagnosis not present

## 2023-06-12 DIAGNOSIS — H2589 Other age-related cataract: Secondary | ICD-10-CM | POA: Diagnosis not present

## 2023-06-12 DIAGNOSIS — H40053 Ocular hypertension, bilateral: Secondary | ICD-10-CM | POA: Diagnosis not present

## 2023-06-12 DIAGNOSIS — H02135 Senile ectropion of left lower eyelid: Secondary | ICD-10-CM | POA: Diagnosis not present

## 2023-06-12 DIAGNOSIS — H04123 Dry eye syndrome of bilateral lacrimal glands: Secondary | ICD-10-CM | POA: Diagnosis not present

## 2023-06-12 DIAGNOSIS — H02132 Senile ectropion of right lower eyelid: Secondary | ICD-10-CM | POA: Diagnosis not present

## 2023-06-12 DIAGNOSIS — H26491 Other secondary cataract, right eye: Secondary | ICD-10-CM | POA: Diagnosis not present

## 2023-06-12 DIAGNOSIS — E119 Type 2 diabetes mellitus without complications: Secondary | ICD-10-CM | POA: Diagnosis not present

## 2023-06-12 LAB — HM DIABETES EYE EXAM

## 2023-06-19 DIAGNOSIS — H57811 Brow ptosis, right: Secondary | ICD-10-CM | POA: Diagnosis not present

## 2023-06-19 DIAGNOSIS — Z789 Other specified health status: Secondary | ICD-10-CM | POA: Diagnosis not present

## 2023-07-24 DIAGNOSIS — L905 Scar conditions and fibrosis of skin: Secondary | ICD-10-CM | POA: Diagnosis not present

## 2023-08-28 ENCOUNTER — Encounter: Payer: Self-pay | Admitting: Family Medicine

## 2023-08-28 ENCOUNTER — Ambulatory Visit (INDEPENDENT_AMBULATORY_CARE_PROVIDER_SITE_OTHER): Payer: Medicare HMO | Admitting: Family Medicine

## 2023-08-28 VITALS — BP 163/66 | HR 71 | Temp 98.0°F | Ht 68.0 in | Wt 182.0 lb

## 2023-08-28 DIAGNOSIS — E876 Hypokalemia: Secondary | ICD-10-CM | POA: Diagnosis not present

## 2023-08-28 DIAGNOSIS — R1012 Left upper quadrant pain: Secondary | ICD-10-CM | POA: Diagnosis not present

## 2023-08-28 DIAGNOSIS — Z23 Encounter for immunization: Secondary | ICD-10-CM | POA: Diagnosis not present

## 2023-08-28 NOTE — Progress Notes (Signed)
Subjective:  Patient ID: Timothy [name], male    DOB: 1941/01/28, 82 y.o.   MRN: [id number]  Patient Care Team: [name], DO as PCP - General (Family Medicine) Rollene Rotunda, MD as PCP - Cardiology (Cardiology) Ernesto Rutherford, MD as Consulting Physician (Ophthalmology) Rollene Rotunda, MD as Consulting Physician (Cardiology) Coralyn Helling, MD as Consulting Physician (Pulmonary Disease) Janalyn Harder, MD (Inactive) as Consulting Physician (Dermatology) Danella Maiers, [facility] as Pharmacist (Family Medicine) Danella Maiers, [facility] as Pharmacist (Family Medicine) Malmfelt, Lise Auer, RN as Oncology Nurse Navigator Lonie Peak, MD as Consulting Physician (Radiation Oncology) Herma Mering, MD as Referring Physician (Dermatology)   Chief Complaint:  Abdominal Pain (X  20month )   HPI: Timothy [name] is a 82 y.o. male presenting on 08/28/2023 for Abdominal Pain (X  20month )   Abdominal Pain This is a recurrent problem. The current episode started more than 1 month ago. The onset quality is undetermined. The problem occurs intermittently. The problem has been waxing and waning. The pain is located in the LUQ. The pain is mild. The quality of the pain is aching and sharp. The abdominal pain does not radiate. Pertinent negatives include no anorexia, arthralgias, belching, constipation, diarrhea, dysuria, fever, flatus, frequency, headaches, hematochezia, hematuria, melena, myalgias, nausea, vomiting or weight loss. The pain is aggravated by certain positions. The pain is relieved by Nothing. He has tried nothing for the symptoms.       Relevant past medical, surgical, family, and social history reviewed and updated as indicated.  Allergies and medications reviewed and updated. Data reviewed: Chart in Epic.   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   RIGHT NASAL  SIDEWALL- NO TX   BCC (basal cell carcinoma) 11/15/2004   LEFT CHEST- TX CURET X 3, 5FU   BCC (basal cell carcinoma) 11/15/2004   RIGHT EAR- TX CURET X 3, EXCISION   BCC (basal cell carcinoma) 08/13/2005   RIGHT EAR- TX MOHS   BCC (basal cell carcinoma) 04/01/2008   SUPERFICIAL- UPPER LEFT BACK- TX CURET X 3, 5FU   BCC (basal cell carcinoma) 12/23/2012   ULCERATED- ABOVE RIGHT LIP- TX MOHS   Cataract    Chronic kidney disease    Colon polyp    Coronary artery disease    (left main normal, LAD 25-30% stenosis, distal 30-40% stenosis, circumflex obtuse marginal 50% stenosis,  right coronary artery dominant with long 75% followed by mid 80%  stenosis followed by 90% stenosis at the ostium of the PDA.  He had  stenting of the PDA by Dr. Riley Kill.  This was a Cypher stent.   First angioplasty was 1994, Cath 2011 100% RCA ).       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   WELL DIFF- RIGHT TEMPLE-TX CURET X3,5FU  SCC (squamous cell carcinoma) 10/14/2019   WELL DIFF- RIGHT SHOULDER-TX CURET X 3, 5FU   SCC (squamous cell carcinoma) 01/24/2019   WELL DIFF- LEFT INNER CHEEK- MOHS   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   ABOVE LEFT OUTER BROW SUPERIOR- TX CURET X 3, 5FU   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   LEFT FOREHEAD-TX CURET X 3, 5FU   SCCA (squamous cell carcinoma) of skin 04/01/2008   RIGHT HAND- TX CURET X 3, 5FU   SCCA (squamous cell carcinoma) of skin  06/02/2013   LEFT SIDE OF FACE- TX CURET AFTER BIOPSY   SCCA (squamous cell carcinoma) of skin 01/12/2021   Neck - anterior (in situ)   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/27/2022   Left Zygomatic Area (in situ) (tx p bx)   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/27/2022   Right Zygomatic Area (well diff)   Sleep apnea    no c-pap   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   in situ- dorsum of nose (CX35FU)    Past Surgical History:  Procedure Laterality Date   APPENDECTOMY     CERVICAL SPINE SURGERY     CHOLECYSTECTOMY     COLONOSCOPY  2004   multiple since   CORONARY ANGIOPLASTY WITH STENT PLACEMENT     3 sents   EYE SURGERY Bilateral    cataracts   HERNIA REPAIR Bilateral    MOHS SURGERY Right 12/2021   SKIN CANCER EXCISION  03/17/2017   SKIN CANCER EXCISION  03/17/2020   left side of face    Social History   Socioeconomic History   Marital status: Married    Spouse name: Engineer, maintenance (IT)   Number of children: 2   Years of education: Not on file   Highest education level: Not on file  Occupational History   Occupation: retired  Tobacco Use   Smoking status: Former    Current packs/day: 0.00    Average packs/day: 2.0 packs/day for 25.0 years (50.0 ttl pk-yrs)    Types: Cigarettes    Start date: 08/13/1966    Quit date: 08/14/1991    Years since quitting: 32.0    Passive exposure: Never   Smokeless tobacco: Never  Vaping Use   Vaping status: Never Used  Substance and Sexual Activity   Alcohol use: Not Currently    Comment: rare   Drug use: No   Sexual activity: Not Currently  Other Topics Concern   Not on file  Social History Narrative   Married x 60 years in 2022.   7 grandchildren.   1 great granddaughter.    Social Determinants of Health   Financial Resource Strain: Low Risk  (11/27/2021)   Overall Financial Resource Strain (CARDIA)    Difficulty of Paying Living Expenses: Not hard at all  Food Insecurity: No Food Insecurity (11/27/2021)    Hunger Vital Sign    Worried About Running Out of Food in the Last Year: Never true    Ran Out of Food in the Last Year: Never true  Transportation Needs: No Transportation Needs (11/27/2021)   PRAPARE - Administrator, Civil Service (Medical): No    Lack of Transportation (Non-Medical): No  Physical Activity: Insufficiently Active (11/27/2021)   Exercise Vital Sign    Days of Exercise per Week: 3 days    Minutes of Exercise per Session: 20 min  Stress: No Stress Concern Present (11/27/2021)   [employer] of Occupational Health - Occupational  Stress Questionnaire    Feeling of Stress : Not at all  Social Connections: Socially Integrated (11/27/2021)   Social Connection and Isolation Panel [NHANES]    Frequency of Communication with Friends and Family: More than three times a week    Frequency of Social Gatherings with Friends and Family: More than three times a week    Attends Religious Services: More than 4 times per year    Active Member of [employer] or Organizations: Yes    Attends Engineer, structural: More than 4 times per year    Marital Status: Married  Catering manager Violence: Not At Risk (11/27/2021)   Humiliation, Afraid, Rape, and Kick questionnaire    Fear of Current or Ex-Partner: No    Emotionally Abused: No    Physically Abused: No    Sexually Abused: No    Outpatient Encounter Medications as of 08/28/2023  Medication Sig   Accu-Chek Softclix Lancets lancets Check BS 4 times daily E11.21   amLODipine (NORVASC) 10 MG tablet Take 1 tablet (10 mg total) by mouth daily. Put on hold   aspirin 81 MG EC tablet Take 81 mg by mouth daily.   blood glucose meter kit and supplies Dispense based on patient and insurance preference. Use up to four times daily as directed. (FOR ICD-10 E11.21)   Blood Glucose Monitoring Suppl (ACCU-CHEK GUIDE ME) w/Device KIT Check BS 4 times daily E11.21   carvedilol (COREG) 12.5 MG tablet Take 1 tablet (12.5 mg total) by  mouth 2 (two) times daily with a meal.   dapagliflozin propanediol (FARXIGA) 5 MG TABS tablet Take 5 mg by mouth daily.   enalapril (VASOTEC) 2.5 MG tablet Take 1 tablet (2.5 mg total) by mouth daily.   esomeprazole (NEXIUM) 40 MG capsule Take 1 capsule (40 mg total) by mouth daily. Put on hold   ezetimibe (ZETIA) 10 MG tablet TAKE 1 TABLET DAILY   glipiZIDE (GLUCOTROL) 5 MG tablet TAKE ONE TABLET ONCE DAILY BEFORE BREAKFAST   glucose blood (ACCU-CHEK GUIDE) test strip Check BS 4 times daily E11.21   isosorbide mononitrate (IMDUR) 60 MG 24 hr tablet TAKE 1 TABLET DAILY   latanoprost (XALATAN) 0.005 % ophthalmic solution Place 1 drop into both eyes at bedtime.   nitroGLYCERIN (NITROSTAT) 0.4 MG SL tablet Place 1 tablet (0.4 mg total) under the tongue every 5 (five) minutes as needed.   nystatin cream (MYCOSTATIN) Apply 1 Application topically 2 (two) times daily. Apply sparingly to the irritated area 2 x daily.   tamsulosin (FLOMAX) 0.4 MG CAPS capsule Take 1 capsule (0.4 mg total) by mouth daily.   rosuvastatin (CRESTOR) 20 MG tablet Take 1 tablet (20 mg total) by mouth daily.   No facility-administered encounter medications on file as of 08/28/2023.    Allergies  Allergen Reactions   Hydrocodone-Acetaminophen Other (See Comments)    Makes pt. Feel loopy   Lipitor [Atorvastatin]     myalgias    Review of Systems  Constitutional:  Negative for activity change, appetite change, chills, diaphoresis, fatigue, fever, unexpected weight change and weight loss.  Respiratory:  Negative for cough and shortness of breath.   Cardiovascular:  Negative for chest pain, palpitations and leg swelling.  Gastrointestinal:  Positive for abdominal pain. Negative for abdominal distention, anal bleeding, anorexia, blood in stool, constipation, diarrhea, flatus, hematochezia, melena, nausea, rectal pain and vomiting.  Genitourinary:  Negative for decreased urine volume, difficulty urinating, dysuria, frequency  and hematuria.  Musculoskeletal:  Negative for arthralgias and myalgias.  Neurological:  Negative for dizziness, tremors, seizures, syncope, facial asymmetry, speech difficulty, weakness, light-headedness, numbness and headaches.  Hematological:  Negative for adenopathy. Does not bruise/bleed easily.  All other systems reviewed and are negative.       Objective:  BP (!) 163/66   Pulse 71   Temp 98 F (36.7 C) (Temporal)   Ht 5\' 8"  (1.727 m)   Wt 182 lb (82.6 kg)   SpO2 92%   BMI 27.67 kg/m    Wt Readings from Last 3 Encounters:  08/28/23 182 lb (82.6 kg)  05/15/23 187 lb (84.8 kg)  01/14/23 182 lb (82.6 kg)    Physical Exam Vitals and nursing note reviewed.  Constitutional:      General: He is not in acute distress.    Appearance: He is well-developed. He is obese. He is not ill-appearing, toxic-appearing or diaphoretic.  HENT:     Head: Normocephalic and atraumatic.     Mouth/Throat:     Mouth: Mucous membranes are moist.  Eyes:     Extraocular Movements: Extraocular movements intact.     Pupils: Pupils are equal, round, and reactive to light.  Pulmonary:     Effort: Pulmonary effort is normal.     Breath sounds: Normal breath sounds.  Abdominal:     General: Abdomen is protuberant.     Palpations: Abdomen is soft. There is no shifting dullness, fluid wave, hepatomegaly, splenomegaly, mass or pulsatile mass.     Tenderness: There is abdominal tenderness in the left upper quadrant. There is no right CVA tenderness, left CVA tenderness, guarding or rebound. Negative signs include Murphy's sign, Rovsing's sign, McBurney's sign, psoas sign and obturator sign.     Hernia: No hernia is present.       Comments: Area of tenderness, no deformity or mass.   Skin:    General: Skin is warm and dry.     Capillary Refill: Capillary refill takes less than 2 seconds.  Neurological:     Mental Status: He is alert and oriented to person, place, and time.  Psychiatric:         Mood and Affect: Mood normal.        Behavior: Behavior normal.     Results for orders placed or performed in visit on 05/15/23  Bayer DCA Hb A1c Waived  Result Value Ref Range   HB A1C (BAYER DCA - WAIVED) 6.5 (H) 4.8 - 5.6 %  Renal Function Panel  Result Value Ref Range   Glucose 167 (H) 70 - 99 mg/dL   BUN 16 8 - 27 mg/dL   Creatinine, Ser 4.40 0.76 - 1.27 mg/dL   eGFR 62 >34 VQ/QVZ/5.63   BUN/Creatinine Ratio 14 10 - 24   Sodium 138 134 - 144 mmol/L   Potassium 3.6 3.5 - 5.2 mmol/L   Chloride 103 96 - 106 mmol/L   CO2 21 20 - 29 mmol/L   Calcium 8.9 8.6 - 10.2 mg/dL   Phosphorus 3.1 2.8 - 4.1 mg/dL   Albumin 4.2 3.7 - 4.7 g/dL  Lipid Panel  Result Value Ref Range   Cholesterol, Total 170 100 - 199 mg/dL   Triglycerides 875 (H) 0 - 149 mg/dL   HDL 29 (L) >64 mg/dL   VLDL Cholesterol Cal 55 (H) 5 - 40 mg/dL   LDL Chol Calc (NIH) 86 0 - 99 mg/dL   Chol/HDL Ratio 5.9 (H) 0.0 - 5.0 ratio       Pertinent labs & imaging results that  were available during my care of the patient were reviewed by me and considered in my medical decision making.  Assessment & Plan:  Timothy [name]" was seen today for abdominal pain.  Diagnoses and all orders for this visit:  Left upper quadrant abdominal pain Will obtain labs to evaluate for anemia, possible infectious process, or electrolyte disturbances. Will obtain US to evaluate area of concern / spleen.  -     US Abdomen Limited; Future -     CBC with Differential/Platelet -     CMP14+EGFR  Encounter for immunization -     Flu Vaccine Trivalent High Dose (Fluad)     Continue all other maintenance medications.  Follow up plan: Return if symptoms worsen or fail to improve.   Continue healthy lifestyle choices, including diet (rich in fruits, vegetables, and lean proteins, and low in salt and simple carbohydrates) and exercise (at least 30 minutes of moderate physical activity daily).   The above assessment and management plan  was discussed with the patient. The patient verbalized understanding of and has agreed to the management plan. Patient is aware to call the clinic if they develop any new symptoms or if symptoms persist or worsen. Patient is aware when to return to the clinic for a follow-up visit. Patient educated on when it is appropriate to go to the emergency department.   Kari Baars, FNP-C [facility] [phone]

## 2023-08-29 LAB — CBC WITH DIFFERENTIAL/PLATELET
Basophils Absolute: 0.1 10*3/uL (ref 0.0–0.2)
Basos: 1 %
EOS (ABSOLUTE): 0.1 10*3/uL (ref 0.0–0.4)
Eos: 2 %
Hematocrit: 41.5 % (ref 37.5–51.0)
Hemoglobin: 13.9 g/dL (ref 13.0–17.7)
Immature Grans (Abs): 0 10*3/uL (ref 0.0–0.1)
Immature Granulocytes: 0 %
Lymphocytes Absolute: 2.5 10*3/uL (ref 0.7–3.1)
Lymphs: 40 %
MCH: 29.8 pg (ref 26.6–33.0)
MCHC: 33.5 g/dL (ref 31.5–35.7)
MCV: 89 fL (ref 79–97)
Monocytes Absolute: 0.5 10*3/uL (ref 0.1–0.9)
Monocytes: 8 %
Neutrophils Absolute: 3.2 10*3/uL (ref 1.4–7.0)
Neutrophils: 49 %
Platelets: 163 10*3/uL (ref 150–450)
RBC: 4.67 x10E6/uL (ref 4.14–5.80)
RDW: 14.5 % (ref 11.6–15.4)
WBC: 6.4 10*3/uL (ref 3.4–10.8)

## 2023-08-29 LAB — CMP14+EGFR
ALT: 30 IU/L (ref 0–44)
AST: 23 IU/L (ref 0–40)
Albumin: 4 g/dL (ref 3.7–4.7)
Alkaline Phosphatase: 72 IU/L (ref 44–121)
BUN/Creatinine Ratio: 13 (ref 10–24)
BUN: 20 mg/dL (ref 8–27)
Bilirubin Total: 0.4 mg/dL (ref 0.0–1.2)
CO2: 20 mmol/L (ref 20–29)
Calcium: 8.9 mg/dL (ref 8.6–10.2)
Chloride: 102 mmol/L (ref 96–106)
Creatinine, Ser: 1.52 mg/dL — ABNORMAL HIGH (ref 0.76–1.27)
Globulin, Total: 2.6 g/dL (ref 1.5–4.5)
Glucose: 152 mg/dL — ABNORMAL HIGH (ref 70–99)
Potassium: 3.3 mmol/L — ABNORMAL LOW (ref 3.5–5.2)
Sodium: 138 mmol/L (ref 134–144)
Total Protein: 6.6 g/dL (ref 6.0–8.5)
eGFR: 45 mL/min/{1.73_m2} — ABNORMAL LOW (ref >59)

## 2023-08-29 NOTE — Addendum Note (Signed)
Addended by: Lorelee Cover C on: 08/29/2023 01:27 PM   Modules accepted: Orders

## 2023-08-30 ENCOUNTER — Other Ambulatory Visit: Payer: Medicare HMO

## 2023-08-30 DIAGNOSIS — E876 Hypokalemia: Secondary | ICD-10-CM | POA: Diagnosis not present

## 2023-08-31 LAB — CMP14+EGFR
ALT: 32 IU/L (ref 0–44)
AST: 27 IU/L (ref 0–40)
Albumin: 4.2 g/dL (ref 3.7–4.7)
Alkaline Phosphatase: 65 IU/L (ref 44–121)
BUN/Creatinine Ratio: 12 (ref 10–24)
BUN: 13 mg/dL (ref 8–27)
Bilirubin Total: 0.5 mg/dL (ref 0.0–1.2)
CO2: 20 mmol/L (ref 20–29)
Calcium: 9.1 mg/dL (ref 8.6–10.2)
Chloride: 102 mmol/L (ref 96–106)
Creatinine, Ser: 1.12 mg/dL (ref 0.76–1.27)
Globulin, Total: 2.4 g/dL (ref 1.5–4.5)
Glucose: 169 mg/dL — ABNORMAL HIGH (ref 70–99)
Potassium: 3.7 mmol/L (ref 3.5–5.2)
Sodium: 138 mmol/L (ref 134–144)
Total Protein: 6.6 g/dL (ref 6.0–8.5)
eGFR: 66 mL/min/{1.73_m2} (ref >59)

## 2023-09-04 ENCOUNTER — Ambulatory Visit (HOSPITAL_COMMUNITY)
Admission: RE | Admit: 2023-09-04 | Discharge: 2023-09-04 | Disposition: A | Discharge status: Home / Self Care | Payer: Medicare HMO | Source: Ambulatory Visit | Attending: Family Medicine | Admitting: Family Medicine

## 2023-09-04 DIAGNOSIS — R1012 Left upper quadrant pain: Secondary | ICD-10-CM | POA: Insufficient documentation

## 2023-09-09 ENCOUNTER — Telehealth: Payer: Self-pay | Admitting: Family Medicine

## 2023-09-09 NOTE — Telephone Encounter (Signed)
Please review and advise.

## 2023-09-09 NOTE — Telephone Encounter (Signed)
Needs to follow up with PCP

## 2023-09-10 ENCOUNTER — Other Ambulatory Visit: Payer: Self-pay | Admitting: Family Medicine

## 2023-09-10 DIAGNOSIS — R1012 Left upper quadrant pain: Secondary | ICD-10-CM

## 2023-09-10 NOTE — Telephone Encounter (Signed)
Referral placed.

## 2023-09-10 NOTE — Telephone Encounter (Signed)
[place] was negative. I agree with referral to GI.

## 2023-09-10 NOTE — Telephone Encounter (Signed)
I've not seen him for this.  I can refer to GI though.  Looks like the [place]/s [place] ordered was negative.

## 2023-09-10 NOTE — Telephone Encounter (Signed)
Aware referral placed

## 2023-09-16 NOTE — Progress Notes (Unsigned)
Subjective: CC:DM PCP: [name], DO VHQ:IONGEXB Timothy [name] is a 82 y.o. male presenting to clinic today for:  1. Type 2 Diabetes with hypertension, hyperlipidemia:  He reports compliance with Farxiga, glipizide, Zetia, Norvasc, Coreg, Imdur and Crestor.  No hypoglycemic episodes reported.  Diabetes Health Maintenance Due  Topic Date Due   OPHTHALMOLOGY EXAM  06/06/2023   HEMOGLOBIN A1C  11/15/2023   FOOT EXAM  05/14/2024    Last A1c:  Lab Results  Component Value Date   HGBA1C 6.5 (H) 05/15/2023    ROS: No reports of chest pain, shortness of breath or edema  2.  Abdominal pain Abdominal pain is his primary concern.  Has had persistent epigastric and left upper quadrant pain.  This was evaluated under ultrasound which was unrevealing.  He reports no associated nausea, vomiting, change in bowel habits or blood in stool.  Pain seems to be relieved by eating.  He is compliant with his Nexium.  He has not yet heard from the gastroenterologist for an appointment yet.  He sees Dr. Ewing Schlein.  He also admits he has not reached out to that office to check on appointment either  ROS: Per HPI  Allergies  Allergen Reactions   Hydrocodone-Acetaminophen Other (See Comments)    Makes pt. 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   RIGHT NASAL SIDEWALL- NO TX   BCC (basal cell carcinoma) 11/15/2004   LEFT CHEST- TX CURET X 3, 5FU   BCC (basal cell carcinoma) 11/15/2004   RIGHT EAR- TX CURET X 3, EXCISION   BCC (basal cell carcinoma) 08/13/2005   RIGHT EAR- TX MOHS   BCC (basal cell carcinoma) 04/01/2008   SUPERFICIAL- UPPER LEFT BACK- TX CURET X 3, 5FU   BCC (basal cell carcinoma) 12/23/2012   ULCERATED- ABOVE RIGHT LIP- TX MOHS   Cataract    Chronic kidney disease    Colon polyp    Coronary artery disease     (left main normal, LAD 25-30% stenosis, distal 30-40% stenosis, circumflex obtuse marginal 50% stenosis,  right coronary artery dominant with long 75% followed by mid 80%  stenosis followed by 90% stenosis at the ostium of the PDA.  He had  stenting of the PDA by Dr. Riley Kill.  This was a Cypher stent.   First angioplasty was 1994, Cath 2011 100% RCA ).       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   WELL DIFF- RIGHT TEMPLE-TX CURET X3,5FU   SCC (squamous cell carcinoma) 10/14/2019   WELL DIFF- RIGHT SHOULDER-TX CURET X 3, 5FU   SCC (squamous cell carcinoma) 01/24/2019   WELL DIFF- LEFT INNER CHEEK- MOHS   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   ABOVE LEFT OUTER  BROW SUPERIOR- TX CURET X 3, 5FU   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   LEFT FOREHEAD-TX CURET X 3, 5FU   SCCA (squamous cell carcinoma) of skin 04/01/2008   RIGHT HAND- TX CURET X 3, 5FU   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   LEFT SIDE OF FACE- TX CURET AFTER BIOPSY   SCCA (squamous cell carcinoma) of skin 01/12/2021   Neck - anterior (in situ)   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/27/2022   Left Zygomatic Area (in situ) (tx p bx)   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/27/2022   Right Zygomatic Area (well diff)   Sleep apnea    no c-pap   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 Put on hold, Disp: 90 tablet, Rfl: 3   aspirin 81 MG EC tablet, Take 81 mg by mouth daily., Disp: , Rfl:    blood glucose meter kit and supplies, Dispense based on patient and insurance preference. Use up to four times daily as directed. (FOR ICD-10 E11.21), Disp: 1 each, Rfl: 0   Blood Glucose Monitoring Suppl (ACCU-CHEK GUIDE ME) w/Device KIT, Check BS 4 times daily E11.21, Disp: 1 kit, Rfl: 0   carvedilol (COREG) 12.5 MG tablet, Take 1 tablet (12.5 mg total) by mouth 2 (two) times daily with a meal., Disp: 180 tablet, Rfl: 3   dapagliflozin propanediol (FARXIGA) 5 MG TABS tablet, Take 5 mg by mouth daily., Disp: , Rfl:    enalapril (VASOTEC) 2.5 MG tablet, Take 1 tablet (2.5 mg total) by mouth daily., Disp: 90 tablet, Rfl: 3   esomeprazole (NEXIUM) 40 MG capsule, Take 1 capsule (40 mg total) by mouth daily. Put on hold, Disp: 90 capsule, Rfl: 3   ezetimibe (ZETIA) 10 MG tablet, TAKE 1 TABLET DAILY, Disp: 90 tablet, Rfl: 1   glipiZIDE (GLUCOTROL) 5 MG tablet, TAKE ONE TABLET ONCE DAILY BEFORE BREAKFAST, Disp: 90 tablet, Rfl: 03   glucose blood (ACCU-CHEK GUIDE) test strip, Check BS 4 times daily E11.21, Disp: 100 each, Rfl: 11   isosorbide mononitrate (IMDUR) 60 MG 24 hr tablet, TAKE 1 TABLET DAILY, Disp: 90 tablet, Rfl: 2   latanoprost (XALATAN) 0.005 % ophthalmic solution, Place 1 drop into both eyes at bedtime., Disp: , Rfl:    nitroGLYCERIN (NITROSTAT) 0.4 MG SL tablet, Place 1 tablet (0.4 mg total) under the tongue every 5 (five) minutes as needed., Disp: 25 tablet, Rfl: 4   nystatin cream (MYCOSTATIN), Apply 1 Application topically 2 (two) times daily. Apply sparingly to the irritated area 2 x daily., Disp: 30 g, Rfl: 2   rosuvastatin (CRESTOR) 20 MG tablet, Take 1 tablet (20 mg total) by mouth daily., Disp: 90 tablet, Rfl: 3    tamsulosin (FLOMAX) 0.4 MG CAPS capsule, Take 1 capsule (0.4 mg total) by mouth daily., Disp: 90 capsule, Rfl: 3 Social History   Socioeconomic History   Marital status: Married    Spouse name: Charlene   Number of children: 2   Years of education: Not on file   Highest education level: Not on file  Occupational History   Occupation: retired  Tobacco Use   Smoking status: Former    Current packs/day: 0.00  Average packs/day: 2.0 packs/day for 25.0 years (50.0 ttl pk-yrs)    Types: Cigarettes    Start date: 08/13/1966    Quit date: 08/14/1991    Years since quitting: 32.1    Passive exposure: Never   Smokeless tobacco: Never  Vaping Use   Vaping status: Never Used  Substance and Sexual Activity   Alcohol use: Not Currently    Comment: rare   Drug use: No   Sexual activity: Not Currently  Other Topics Concern   Not on file  Social History Narrative   Married x 60 years in 2022.   7 grandchildren.   1 great granddaughter.    Social Determinants of Health   Financial Resource Strain: Low Risk  (11/27/2021)   Overall Financial Resource Strain (CARDIA)    Difficulty of Paying Living Expenses: Not hard at all  Food Insecurity: No Food Insecurity (11/27/2021)   Hunger Vital Sign    Worried About Running Out of Food in the Last Year: Never true    Ran Out of Food in the Last Year: Never true  Transportation Needs: No Transportation Needs (11/27/2021)   PRAPARE - Administrator, Civil Service (Medical): No    Lack of Transportation (Non-Medical): No  Physical Activity: Insufficiently Active (11/27/2021)   Exercise Vital Sign    Days of Exercise per Week: 3 days    Minutes of Exercise per Session: 20 min  Stress: No Stress Concern Present (11/27/2021)   [employer] of Occupational Health - Occupational Stress Questionnaire    Feeling of Stress : Not at all  Social Connections: Socially Integrated (11/27/2021)   Social Connection and Isolation Panel  [NHANES]    Frequency of Communication with Friends and Family: More than three times a week    Frequency of Social Gatherings with Friends and Family: More than three times a week    Attends Religious Services: More than 4 times per year    Active Member of [employer] or Organizations: Yes    Attends Engineer, structural: More than 4 times per year    Marital Status: Married  Catering manager Violence: Not At Risk (11/27/2021)   Humiliation, Afraid, Rape, and Kick questionnaire    Fear of Current or Ex-Partner: No    Emotionally Abused: No    Physically Abused: No    Sexually Abused: No   Family History  Problem Relation Age of Onset   Cancer Mother        unsure of type   Glaucoma Mother    Heart disease Father    Coronary artery disease Father    Cancer Sister 49       granlocytosis (Wergerner's)   Colon cancer Brother 51   Nephritis Sister        20's   Cancer Brother        lung     Objective: Office vital signs reviewed. BP (!) 140/82   Pulse 80   Temp 98.8 F (37.1 C)   Ht 5\' 8"  (1.727 m)   Wt 180 lb (81.6 kg)   SpO2 93%   BMI 27.37 kg/m   Physical Examination:  General: Awake, alert, well nourished, No acute distress HEENT right conjunctival bleed appreciated that does not cross into the sclera or anterior chamber.  He has ptosis right greater than left Cardio: regular rate and rhythm, S1S2 heard, no murmurs appreciated Pulm: clear to auscultation bilaterally, no wheezes, rhonchi or rales; normal work of breathing on room air  GI: soft, LUQ and epigastric tenderness present.  No rebound or guarding.  Non-distended, bowel sounds present x4, no hepatomegaly, no splenomegaly, no masses   Assessment/ Plan: 82 y.o. male   Type 2 diabetes mellitus with stage 2 chronic kidney disease, without long-term current use of insulin (HCC) - Plan: Bayer DCA Hb A1c Waived  Hyperlipidemia associated with type 2 diabetes mellitus (HCC)  Hypertension associated with  type 2 diabetes mellitus (HCC)  Long term current use of oral hypoglycemic drug  Epigastric pain - Plan: sucralfate (CARAFATE) 1 g tablet, famotidine (PEPCID) 20 MG tablet  Sugar well-controlled with A1c of 6.7 today.  He will continue all medications as prescribed.  Diabetic eye exam recommended  Continue statin and current blood pressure regimen  I have a high suspicion for a peptic ulcer.  I am going to empirically treat him with some Carafate and add Pepcid to his regimen.  Continue Nexium.  Agree with follow-up with gastroenterology as I anticipate he may need EGD to further evaluate.  We discussed red flag signs and symptoms warranting further evaluation and he voiced good understanding  Lilyanne Mcquown Hulen Skains, DO [facility] [phone]

## 2023-09-17 ENCOUNTER — Ambulatory Visit (INDEPENDENT_AMBULATORY_CARE_PROVIDER_SITE_OTHER): Payer: Medicare HMO | Admitting: Family Medicine

## 2023-09-17 ENCOUNTER — Encounter: Payer: Self-pay | Admitting: Family Medicine

## 2023-09-17 VITALS — BP 140/82 | HR 80 | Temp 98.8°F | Ht 68.0 in | Wt 180.0 lb

## 2023-09-17 DIAGNOSIS — E1122 Type 2 diabetes mellitus with diabetic chronic kidney disease: Secondary | ICD-10-CM

## 2023-09-17 DIAGNOSIS — E1169 Type 2 diabetes mellitus with other specified complication: Secondary | ICD-10-CM | POA: Diagnosis not present

## 2023-09-17 DIAGNOSIS — I152 Hypertension secondary to endocrine disorders: Secondary | ICD-10-CM

## 2023-09-17 DIAGNOSIS — E785 Hyperlipidemia, unspecified: Secondary | ICD-10-CM | POA: Diagnosis not present

## 2023-09-17 DIAGNOSIS — R1013 Epigastric pain: Secondary | ICD-10-CM | POA: Diagnosis not present

## 2023-09-17 DIAGNOSIS — E1159 Type 2 diabetes mellitus with other circulatory complications: Secondary | ICD-10-CM

## 2023-09-17 DIAGNOSIS — Z7984 Long term (current) use of oral hypoglycemic drugs: Secondary | ICD-10-CM

## 2023-09-17 DIAGNOSIS — N182 Chronic kidney disease, stage 2 (mild): Secondary | ICD-10-CM | POA: Diagnosis not present

## 2023-09-17 LAB — BAYER DCA HB A1C WAIVED: HB A1C (BAYER DCA - WAIVED): 6.7 % — ABNORMAL HIGH (ref 4.8–5.6)

## 2023-09-17 MED ORDER — FAMOTIDINE 20 MG PO TABS
20.0000 mg | ORAL_TABLET | Freq: Every day | ORAL | 3 refills | Status: DC
Start: 2023-09-17 — End: 2024-01-29

## 2023-09-17 MED ORDER — SUCRALFATE 1 G PO TABS
1.0000 g | ORAL_TABLET | Freq: Three times a day (TID) | ORAL | 0 refills | Status: AC
Start: 2023-09-17 — End: 2024-08-24

## 2023-09-17 NOTE — Patient Instructions (Signed)
 Peptic Ulcer  A peptic ulcer is a painful sore in the lining of your stomach or the first part of your small intestine. What are the causes? Common causes of this condition include: An infection. Using certain pain medicines too often or too much. Rare tumors in the stomach, small intestine, or pancreas. What increases the risk? You are more likely to get this condition if you: Smoke. Have a family history of ulcer disease. Drink alcohol. Have been hospitalized in an intensive care unit (ICU). What are the signs or symptoms? Symptoms include: Burning pain in the area between the chest and the belly button. The pain may: Not go away (be persistent). Be worse when your stomach is empty. Be worse at night. Heartburn. Feeling sick to your stomach (nauseous) and throwing up (vomiting). Bloating. If the ulcer results in bleeding, it can cause you to: Have poop (stool) that is black and looks like tar. Throw up bright red blood. Throw up material that looks like coffee grounds. How is this treated? Treatment for this condition may include: Stopping things that can cause the ulcer, such as: Smoking. Using pain medicines. Drinking alcohol or caffeine. Medicines to reduce stomach acid. Antibiotic medicines if the ulcer is caused by an infection. A procedure that is done using a small, flexible tube that has a camera at the end (upper endoscopy). This may be done if you have a bleeding ulcer. Surgery. This may be needed if: You have a lot of bleeding. The ulcer caused a hole somewhere in the digestive system. Follow these instructions at home: Do not drink alcohol if your doctor tells you not to drink. Limit how much caffeine you take in. Do not smoke or use any products that contain nicotine or tobacco. If you need help quitting, ask your doctor. Take over-the-counter and prescription medicines only as told by your doctor. Do not stop or change your medicines unless you talk with  your doctor about it first. Do not take aspirin, ibuprofen, or other NSAIDs unless your doctor told you to do so. Keep all follow-up visits. Contact a doctor if: You do not get better in 7 days after you start treatment. You keep having an upset stomach (indigestion) or heartburn. Get help right away if: You have sudden, sharp pain in your belly (abdomen). You have belly pain that does not go away. You have bloody poop (stool) or black, tarry poop. You throw up blood. It may look like coffee grounds. You feel light-headed or feel like you may pass out (faint). You get weak. You get sweaty or feel sticky and cold to the touch (clammy). These symptoms may be an emergency. Get help right away. Call 911. Do not wait to see if the symptoms will go away. Do not drive yourself to the hospital. Summary Symptoms of a peptic ulcer include burning pain in the area between the chest and the belly button. Do not smoke or use any products that contain nicotine or tobacco. If you need help quitting, ask your doctor. Take medicines only as told by your doctor. Limit how much alcohol and caffeine you have. Keep all follow-up visits. This information is not intended to replace advice given to you by your health care provider. Make sure you discuss any questions you have with your health care provider. Document Revised: 07/14/2021 Document Reviewed: 07/14/2021 Elsevier Patient Education  2024 [employer].

## 2023-09-25 ENCOUNTER — Encounter: Payer: Self-pay | Admitting: Physician Assistant

## 2023-09-25 ENCOUNTER — Ambulatory Visit: Payer: Medicare HMO | Admitting: Physician Assistant

## 2023-09-25 VITALS — BP 150/68 | HR 73 | Ht 61.0 in | Wt 188.0 lb

## 2023-09-25 DIAGNOSIS — R1012 Left upper quadrant pain: Secondary | ICD-10-CM | POA: Diagnosis not present

## 2023-09-25 NOTE — Progress Notes (Signed)
Chief Complaint: LUQ pain  HPI:    Timothy [name] is an 82 y/o Caucasian male with a past medical history as listed below including CKD, CAD, diabetes, MI and multiple others, known to [name] Payor, who was referred to me by [name], DO for a complaint of left upper quadrant pain.      05/21/2018 colonoscopy for personal history of adenomatous polyps with 1 diminutive polyp in the transverse colon and otherwise normal.  No repeat recommended to age.    08/28/2023 patient saw PCP for left upper quadrant pain.  At that time occurring for a month already and was intermittent and waxing and waning.  Labs were ordered as well as an ultrasound.    08/28/2023 CBC normal, CMP with a glucose of 152, creatinine 1.52, potassium low at 3.3.    09/04/2023 abdominal ultrasound with no abnormality.    Today, the patient tells me that back in August he started with a left upper quadrant pain that seems to come and go and is sometimes worse than others in intensity.  Sometimes though it is a very sharp stabbing 10/10 that comes and goes fairly quickly.  He has noticed maybe a little bit of increase in gas in his system overall with increased eructations as well as flatulence.  Still has daily bowel movements that feel complete with no signs of constipation.  He is typically on Esomeprazole 40 mg daily and about a week and a half ago his PCP added Pepcid 20 mg at night as well as Carafate 1 g 3 times daily.  He is not sure that these are really helping much.  Tells me this pain really does not hurt if he is up and moving around typically and yesterday he was actually on his tractor for 5 hours and it did not bother him at all.  Does not wake him from his sleep.  No nausea or vomiting.    Denies fever, chills or weight loss.  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   RIGHT NASAL SIDEWALL- NO TX   BCC  (basal cell carcinoma) 11/15/2004   LEFT CHEST- TX CURET X 3, 5FU   BCC (basal cell carcinoma) 11/15/2004   RIGHT EAR- TX CURET X 3, EXCISION   BCC (basal cell carcinoma) 08/13/2005   RIGHT EAR- TX MOHS   BCC (basal cell carcinoma) 04/01/2008   SUPERFICIAL- UPPER LEFT BACK- TX CURET X 3, 5FU   BCC (basal cell carcinoma) 12/23/2012   ULCERATED- ABOVE RIGHT LIP- TX MOHS   Cataract    Chronic kidney disease    Colon polyp    Coronary artery disease    (left main normal, LAD 25-30% stenosis, distal 30-40% stenosis, circumflex obtuse marginal 50% stenosis,  right coronary artery dominant with long 75% followed by mid 80%  stenosis followed by 90% stenosis at the ostium of the PDA.  He had  stenting of the PDA by Dr. Riley Kill.  This was a Cypher stent.   First angioplasty was 1994, Cath 2011 100% RCA ).       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   WELL DIFF- RIGHT TEMPLE-TX CURET X3,5FU   SCC (squamous cell carcinoma) 10/14/2019   WELL DIFF- RIGHT SHOULDER-TX CURET X 3, 5FU   SCC (squamous cell carcinoma) 01/24/2019   WELL DIFF- LEFT INNER CHEEK- MOHS   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   ABOVE LEFT OUTER BROW SUPERIOR- TX CURET X 3, 5FU   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   LEFT FOREHEAD-TX CURET X 3, 5FU   SCCA (squamous cell carcinoma) of skin 04/01/2008   RIGHT HAND- TX CURET X 3, 5FU   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   LEFT SIDE  OF FACE- TX CURET AFTER BIOPSY   SCCA (squamous cell carcinoma) of skin 01/12/2021   Neck - anterior (in situ)   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/27/2022   Left Zygomatic Area (in situ) (tx p bx)   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/27/2022   Right Zygomatic Area (well diff)   Sleep apnea    no c-pap   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   in situ- dorsum of nose (CX35FU)    Past Surgical History:  Procedure Laterality Date   APPENDECTOMY     CERVICAL SPINE SURGERY     CHOLECYSTECTOMY     COLONOSCOPY  2004   multiple since   CORONARY ANGIOPLASTY WITH STENT PLACEMENT     3 sents   EYE SURGERY Bilateral    cataracts   HERNIA REPAIR Bilateral    MOHS SURGERY Right 12/2021   SKIN CANCER EXCISION  03/17/2017   SKIN CANCER EXCISION  03/17/2020   left side of face    Current Outpatient Medications  Medication Sig Dispense Refill   Accu-Chek Softclix Lancets lancets Check BS 4 times daily E11.21 100 each 11   amLODipine (NORVASC) 10 MG tablet Take 1 tablet (10 mg total) by mouth daily. Put on hold 90 tablet 3   aspirin 81 MG EC tablet Take 81 mg by mouth daily.     blood glucose meter kit and supplies Dispense based on patient and insurance preference. Use up to four times daily as directed. (FOR ICD-10 E11.21) 1 each 0   Blood Glucose Monitoring Suppl (ACCU-CHEK GUIDE ME) w/Device KIT Check BS 4 times daily E11.21 1 kit 0   carvedilol (COREG) 12.5 MG tablet Take 1 tablet (12.5 mg total) by mouth 2 (two) times daily with a meal. 180 tablet 3   dapagliflozin propanediol (FARXIGA) 5 MG TABS tablet Take 5 mg by mouth daily.     enalapril (VASOTEC) 2.5 MG tablet Take 1 tablet (2.5 mg total) by mouth daily. 90 tablet 3   esomeprazole (NEXIUM) 40 MG capsule Take 1 capsule (40 mg total) by mouth daily. Put on hold 90 capsule 3   ezetimibe (ZETIA) 10 MG tablet TAKE 1 TABLET DAILY 90 tablet 1   famotidine (PEPCID) 20 MG tablet Take 1 tablet (20 mg total) by mouth  daily. 90 tablet 3   glipiZIDE (GLUCOTROL) 5 MG tablet TAKE ONE TABLET ONCE DAILY BEFORE BREAKFAST 90 tablet 03   glucose blood (ACCU-CHEK GUIDE) test strip Check BS 4 times daily E11.21 100 each 11  isosorbide mononitrate (IMDUR) 60 MG 24 hr tablet TAKE 1 TABLET DAILY 90 tablet 2   latanoprost (XALATAN) 0.005 % ophthalmic solution Place 1 drop into both eyes at bedtime.     nitroGLYCERIN (NITROSTAT) 0.4 MG SL tablet Place 1 tablet (0.4 mg total) under the tongue every 5 (five) minutes as needed. 25 tablet 4   nystatin cream (MYCOSTATIN) Apply 1 Application topically 2 (two) times daily. Apply sparingly to the irritated area 2 x daily. 30 g 2   sucralfate (CARAFATE) 1 g tablet Take 1 tablet (1 g total) by mouth with breakfast, with lunch, and with evening meal for 14 days. 42 tablet 0   tamsulosin (FLOMAX) 0.4 MG CAPS capsule Take 1 capsule (0.4 mg total) by mouth daily. 90 capsule 3   rosuvastatin (CRESTOR) 20 MG tablet Take 1 tablet (20 mg total) by mouth daily. 90 tablet 3   No current facility-administered medications for this visit.    Allergies as of 09/25/2023 - Review Complete 09/25/2023  Allergen Reaction Noted   Hydrocodone-acetaminophen Other (See Comments)    Lipitor [atorvastatin]  05/08/2022    Family History  Problem Relation Age of Onset   Cancer Mother        unsure of type   Glaucoma Mother    Heart disease Father    Coronary artery disease Father    Cancer Sister 64       granlocytosis (Wergerner's)   Nephritis Sister        20's   Colon cancer Brother 41   Cancer Brother        lung    Liver disease Neg Hx    Esophageal cancer Neg Hx     Social History   Socioeconomic History   Marital status: Married    Spouse name: Charlene   Number of children: 2   Years of education: Not on file   Highest education level: Not on file  Occupational History   Occupation: retired  Tobacco Use   Smoking status: Former    Current packs/day: 0.00    Average  packs/day: 2.0 packs/day for 25.0 years (50.0 ttl pk-yrs)    Types: Cigarettes    Start date: 08/13/1966    Quit date: 08/14/1991    Years since quitting: 32.1    Passive exposure: Never   Smokeless tobacco: Never  Vaping Use   Vaping status: Never Used  Substance and Sexual Activity   Alcohol use: Not Currently    Comment: rare   Drug use: No   Sexual activity: Not Currently  Other Topics Concern   Not on file  Social History Narrative   Married x 60 years in 2022.   7 grandchildren.   1 great granddaughter.    Social Determinants of Health   Financial Resource Strain: Low Risk  (11/27/2021)   Overall Financial Resource Strain (CARDIA)    Difficulty of Paying Living Expenses: Not hard at all  Food Insecurity: No Food Insecurity (11/27/2021)   Hunger Vital Sign    Worried About Running Out of Food in the Last Year: Never true    Ran Out of Food in the Last Year: Never true  Transportation Needs: No Transportation Needs (11/27/2021)   PRAPARE - Administrator, Civil Service (Medical): No    Lack of Transportation (Non-Medical): No  Physical Activity: Insufficiently Active (11/27/2021)   Exercise Vital Sign    Days of Exercise per Week: 3 days    Minutes of Exercise per Session:  20 min  Stress: No Stress Concern Present (11/27/2021)   [employer] of Occupational Health - Occupational Stress Questionnaire    Feeling of Stress : Not at all  Social Connections: Socially Integrated (11/27/2021)   Social Connection and Isolation Panel [NHANES]    Frequency of Communication with Friends and Family: More than three times a week    Frequency of Social Gatherings with Friends and Family: More than three times a week    Attends Religious Services: More than 4 times per year    Active Member of [employer] or Organizations: Yes    Attends Engineer, structural: More than 4 times per year    Marital Status: Married  Catering manager Violence: Not At Risk  (11/27/2021)   Humiliation, Afraid, Rape, and Kick questionnaire    Fear of Current or Ex-Partner: No    Emotionally Abused: No    Physically Abused: No    Sexually Abused: No    Review of Systems:    Constitutional: No weight loss, fever or chills Skin: No rash Cardiovascular: No chest pain   Respiratory: No SOB  Gastrointestinal: See HPI and otherwise negative Genitourinary: No dysuria or change in urinary frequency Neurological: No headache, dizziness or syncope Musculoskeletal: No new muscle or joint pain Hematologic: No bleeding  Psychiatric: No history of depression or anxiety   Physical Exam:  Vital signs: BP (!) 150/68   Pulse 73   Ht 5\' 1"  (1.549 m)   Wt 188 lb (85.3 kg)   BMI 35.52 kg/m   Constitutional:   Pleasant elderly Caucasian male appears to be in NAD, Well developed, Well nourished, alert and cooperative Head:  Normocephalic and atraumatic. Eyes:   PEERL, EOMI. No icterus. Conjunctiva pink. Ears:  Normal auditory acuity. Neck:  Supple Throat: Oral cavity and pharynx without inflammation, swelling or lesion.  Respiratory: Respirations even and unlabored. Lungs clear to auscultation bilaterally.   No wheezes, crackles, or rhonchi.  Cardiovascular: Normal S1, S2. No MRG. Regular rate and rhythm. No peripheral edema, cyanosis or pallor.  Gastrointestinal:  Soft, nondistended, mild left upper quadrant TTP. No rebound or guarding. Normal bowel sounds. No appreciable masses or hepatomegaly. Rectal:  Not performed.  Msk:  Symmetrical without gross deformities. Without edema, no deformity or joint abnormality.  Neurologic:  Alert and  oriented x4;  grossly normal neurologically.  Skin:   Dry and intact without significant lesions or rashes. Psychiatric: Demonstrates good judgement and reason without abnormal affect or behaviors.  RELEVANT LABS AND IMAGING: CBC    Component Value Date/Time   WBC 6.4 08/28/2023 1557   WBC 4.0 08/18/2018 0443   RBC 4.67  08/28/2023 1557   RBC 3.44 (L) 08/18/2018 0443   HGB 13.9 08/28/2023 1557   HCT 41.5 08/28/2023 1557   PLT 163 08/28/2023 1557   MCV 89 08/28/2023 1557   MCH 29.8 08/28/2023 1557   MCH 31.1 08/18/2018 0443   MCHC 33.5 08/28/2023 1557   MCHC 33.8 08/18/2018 0443   RDW 14.5 08/28/2023 1557   LYMPHSABS 2.5 08/28/2023 1557   MONOABS 0.4 06/22/2010 0000   EOSABS 0.1 08/28/2023 1557   BASOSABS 0.1 08/28/2023 1557    CMP     Component Value Date/Time   NA 138 08/30/2023 0909   K 3.7 08/30/2023 0909   CL 102 08/30/2023 0909   CO2 20 08/30/2023 0909   GLUCOSE 169 (H) 08/30/2023 0909   GLUCOSE 141 (H) 08/18/2018 1324   BUN 13 08/30/2023 0909   CREATININE  1.12 08/30/2023 0909   CALCIUM 9.1 08/30/2023 0909   PROT 6.6 08/30/2023 0909   ALBUMIN 4.2 08/30/2023 0909   AST 27 08/30/2023 0909   ALT 32 08/30/2023 0909   ALKPHOS 65 08/30/2023 0909   BILITOT 0.5 08/30/2023 0909   GFRNONAA 44 (L) 06/27/2020 0814   GFRAA 51 (L) 06/27/2020 1610    Assessment: 1.  Left upper quadrant pain: Recent abdominal ultrasound normal, labs normal, no real help over the past week and a half from the addition of Pepcid to his normal Nexium 40 mg daily and Carafate 3 times daily, some increase in gas in his system, pain seems to come and go in intensity, bowel movements normal; consider splenic flexure syndrome versus less likely colitis versus musculoskeletal etiology  Plan: 1.  Scheduled patient for a CT of the abdomen pelvis with contrast for further evaluation of ongoing left upper quadrant pain. 2.  Recommend the patient buy over-the-counter Gas-X and uses 1 tab before every meal. 3.  If CT is normal could consider further testing for SIBO 4.  Patient to follow in clinic with me per recommendations after CT above.  Hyacinth Meeker, PA-C [facility] 09/25/2023, 9:21 AM  Cc: [name], DO

## 2023-09-25 NOTE — Patient Instructions (Signed)
You have been scheduled for a CT scan of the abdomen and pelvis at [facility], 1st floor Radiology. You are scheduled on 10/02/23 . You should arrive 1:30pm to your appointment time to drink the contrast.  Please follow the written instructions below on the day of your exam:   1) Do not eat anything after 11:30am (4 hours prior to your test)   You may take any medications as prescribed with a small amount of water, if necessary. If you take any of the following medications: METFORMIN, GLUCOPHAGE, GLUCOVANCE, AVANDAMET, RIOMET, FORTAMET, ACTOPLUS MET, JANUMET, GLUMETZA or METAGLIP, you MAY be asked to HOLD this medication 48 hours AFTER the exam.   The purpose of you drinking the oral contrast is to aid in the visualization of your intestinal tract. The contrast solution may cause some diarrhea. Depending on your individual set of symptoms, you may also receive an intravenous injection of x-ray contrast/dye. Plan on being at [facility] for 45 minutes or longer, depending on the type of exam you are having performed.   If you have any questions regarding your exam or if you need to reschedule, you may call [facility] at [phone] between the hours of 8:00 am and 5:00 pm, Monday-Friday.    If your blood pressure at your visit was 140/90 or greater, please contact your primary care physician to follow up on this.  _______________________________________________________  If you are age 27 or older, your body mass index should be between 23-30. Your Body mass index is 35.52 kg/m. If this is out of the aforementioned range listed, please consider follow up with your Primary Care Provider.  If you are age 40 or younger, your body mass index should be between 19-25. Your Body mass index is 35.52 kg/m. If this is out of the aformentioned range listed, please consider follow up with your Primary Care Provider.   ________________________________________________________  The  [facility] [facility] providers would like to encourage you to use [facility] to communicate with providers for non-urgent requests or questions.  Due to long hold times on the telephone, sending your provider a message by [facility] may be a faster and more efficient way to get a response.  Please allow 48 business hours for a response.  Please remember that this is for non-urgent requests.   It was a pleasure to see you today!  Thank you for trusting me with your gastrointestinal care!    Hyacinth Meeker, PA-C

## 2023-10-01 DIAGNOSIS — L821 Other seborrheic keratosis: Secondary | ICD-10-CM | POA: Diagnosis not present

## 2023-10-01 DIAGNOSIS — Z85828 Personal history of other malignant neoplasm of skin: Secondary | ICD-10-CM | POA: Diagnosis not present

## 2023-10-01 DIAGNOSIS — Z85820 Personal history of malignant melanoma of skin: Secondary | ICD-10-CM | POA: Diagnosis not present

## 2023-10-01 DIAGNOSIS — L814 Other melanin hyperpigmentation: Secondary | ICD-10-CM | POA: Diagnosis not present

## 2023-10-01 DIAGNOSIS — Z789 Other specified health status: Secondary | ICD-10-CM | POA: Diagnosis not present

## 2023-10-01 DIAGNOSIS — L820 Inflamed seborrheic keratosis: Secondary | ICD-10-CM | POA: Diagnosis not present

## 2023-10-01 DIAGNOSIS — L538 Other specified erythematous conditions: Secondary | ICD-10-CM | POA: Diagnosis not present

## 2023-10-01 DIAGNOSIS — D1801 Hemangioma of skin and subcutaneous tissue: Secondary | ICD-10-CM | POA: Diagnosis not present

## 2023-10-01 DIAGNOSIS — Z08 Encounter for follow-up examination after completed treatment for malignant neoplasm: Secondary | ICD-10-CM | POA: Diagnosis not present

## 2023-10-02 ENCOUNTER — Ambulatory Visit (HOSPITAL_COMMUNITY)
Admission: RE | Admit: 2023-10-02 | Discharge: 2023-10-02 | Disposition: A | Discharge status: Home / Self Care | Payer: Medicare HMO | Source: Ambulatory Visit | Attending: Physician Assistant | Admitting: Physician Assistant

## 2023-10-02 DIAGNOSIS — R1900 Intra-abdominal and pelvic swelling, mass and lump, unspecified site: Secondary | ICD-10-CM | POA: Diagnosis not present

## 2023-10-02 DIAGNOSIS — R1012 Left upper quadrant pain: Secondary | ICD-10-CM | POA: Diagnosis not present

## 2023-10-02 DIAGNOSIS — N400 Enlarged prostate without lower urinary tract symptoms: Secondary | ICD-10-CM | POA: Diagnosis not present

## 2023-10-02 DIAGNOSIS — Z9049 Acquired absence of other specified parts of digestive tract: Secondary | ICD-10-CM | POA: Diagnosis not present

## 2023-10-02 MED ORDER — IOHEXOL 9 MG/ML PO SOLN
1000.0000 mL | Freq: Once | ORAL | Status: AC
  Administered 2023-10-02: 1000 mL via ORAL

## 2023-10-02 MED ORDER — SODIUM CHLORIDE (PF) 0.9 % IJ SOLN
INTRAMUSCULAR | Status: AC
  Filled 2023-10-02: qty 50

## 2023-10-02 MED ORDER — IOHEXOL 300 MG/ML  SOLN
100.0000 mL | Freq: Once | INTRAMUSCULAR | Status: AC | PRN
  Administered 2023-10-02: 100 mL via INTRAVENOUS

## 2023-10-10 DIAGNOSIS — H40053 Ocular hypertension, bilateral: Secondary | ICD-10-CM | POA: Diagnosis not present

## 2023-10-14 ENCOUNTER — Telehealth: Payer: Self-pay | Admitting: Physician Assistant

## 2023-10-14 NOTE — Telephone Encounter (Signed)
Inbound call from patient, calling to follow up on CT results. States it was done on 10/16 and he has yet to hear about results, Patient is anxious awaiting results.

## 2023-10-14 NOTE — Telephone Encounter (Signed)
Called patient in reference to his call and concern about not having been informed about the CT that he had on 10/02/23. Patient was informed the Imaging reading is 2-3 weeks behind at this time. Apologized for the inconvenience, informed him that  he will be notified as soon as we get the information. Patient was agreeable.

## 2023-10-20 NOTE — Progress Notes (Unsigned)
Cardiology Office Note:   Date:  10/23/2023  ID:  Timothy [name], DOB 05/08/1941, MRN [id number] PCP: [name], DO  [facility] Providers Cardiologist:  Rollene Rotunda, MD {  History of Present Illness:   Timothy [name] is a 82 y.o. male who presents for follow up of CAD.   He had a stress perfusion study in 2021 with evidence of infarct but no ischemia.  Since I last saw him he has had no new cardiovascular complaints.  He has had a discomfort in his left chest lower near the mid axillary line and has had a workup of this.  He had a CT the results of which are not back yet.  It is somewhat tender to palpate and maybe a little bit with movement.  Is not like his previous angina.  He has not had any substernal chest pressure, neck or arm discomfort.  He said no new shortness of breath, PND or orthopnea.  Has had no new palpitations, presyncope or syncope.  He has had no weight gain or edema.  ROS: As stated in the HPI and negative for all other systems.  Studies Reviewed:    EKG:   EKG Interpretation Date/Time:  Wednesday October 23 2023 16:21:33 EST Ventricular Rate:  68 PR Interval:  176 QRS Duration:  104 QT Interval:  410 QTC Calculation: 435 R Axis:   -22  Text Interpretation: Sinus rhythm with occasional Premature ventricular complexes T wave abnormality, consider lateral ischemia When compared with ECG of 12-May-2004 18:55, Premature ventricular complexes are now Present Non-specific change in ST segment in Inferior leads Confirmed by Rollene Rotunda ([id number]) on 10/23/2023 4:58:40 PM     Risk Assessment/Calculations:              Physical Exam:   VS:  BP 136/60   Pulse 68   Ht 5\' 7"  (1.702 m)   Wt 184 lb (83.5 kg)   BMI 28.82 kg/m    Wt Readings from Last 3 Encounters:  10/23/23 184 lb (83.5 kg)  09/25/23 188 lb (85.3 kg)  09/17/23 180 lb (81.6 kg)     GEN: Well nourished, well developed in no acute distress NECK: No JVD; No carotid  bruits CARDIAC: RRR, no murmurs, rubs, gallops RESPIRATORY:  Clear to auscultation without rales, wheezing or rhonchi  ABDOMEN: Soft, non-tender, non-distended EXTREMITIES:  No edema; No deformity   ASSESSMENT AND PLAN:   CAD -  The patient has no new sypmtoms.  No further cardiovascular testing is indicated.  We will continue with aggressive risk reduction and meds as listed.  HTN -  The blood pressure is at target.  No change in therapy.  DYSLIPIDEMIA -  LDL was elevated at 86.  This is up from previous.  He has been unable to afford Vascepa.  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  He seen Dr. Rennis Golden.  I am going to increase his Crestor.  He seems to be tolerating this although he did not previously tolerate Lipitor.   Will check a lipid profile in 3 months.  He is to have follow-up with Dr. Rennis Golden and I will arrange this.   CKD STAGE III - Creatinine was 1.12.  This is improved from previous.  No change in therapy.     DM - A1c is down to 6.7.  No change in therapy.         Follow up with me in one year.   Signed, Rollene Rotunda,  MD

## 2023-10-21 ENCOUNTER — Other Ambulatory Visit: Payer: Self-pay | Admitting: Internal Medicine

## 2023-10-23 ENCOUNTER — Other Ambulatory Visit: Payer: Self-pay | Admitting: *Deleted

## 2023-10-23 ENCOUNTER — Ambulatory Visit: Payer: Medicare HMO | Admitting: Cardiology

## 2023-10-23 ENCOUNTER — Encounter: Payer: Self-pay | Admitting: Cardiology

## 2023-10-23 VITALS — BP 136/60 | HR 68 | Ht 67.0 in | Wt 184.0 lb

## 2023-10-23 DIAGNOSIS — Z79899 Other long term (current) drug therapy: Secondary | ICD-10-CM | POA: Diagnosis not present

## 2023-10-23 DIAGNOSIS — E1121 Type 2 diabetes mellitus with diabetic nephropathy: Secondary | ICD-10-CM | POA: Diagnosis not present

## 2023-10-23 DIAGNOSIS — I10 Essential (primary) hypertension: Secondary | ICD-10-CM

## 2023-10-23 DIAGNOSIS — E785 Hyperlipidemia, unspecified: Secondary | ICD-10-CM

## 2023-10-23 DIAGNOSIS — I2510 Atherosclerotic heart disease of native coronary artery without angina pectoris: Secondary | ICD-10-CM | POA: Diagnosis not present

## 2023-10-23 DIAGNOSIS — N1832 Chronic kidney disease, stage 3b: Secondary | ICD-10-CM

## 2023-10-23 MED ORDER — ROSUVASTATIN CALCIUM 40 MG PO TABS: 40.0000 mg | ORAL_TABLET | Freq: Every day | ORAL | 3 refills | Status: DC

## 2023-10-23 NOTE — Patient Instructions (Signed)
Medication Instructions:  Please increase Crestor to 40 mg a day. Continue all other medications as listed.  *If you need a refill on your cardiac medications before your next appointment, please call your pharmacy*   Lab Work: Please have blood work in 3 months at [facility] or your [employer].  (Lipid) If you have labs (blood work) drawn today and your tests are completely normal, you will receive your results only by: MyChart Message (if you have MyChart) OR A paper copy in the mail If you have any lab test that is abnormal or we need to change your treatment, we will call you to review the results.  Please follow up with Dr Rennis Golden.  If you do not hear from our scheduling department, please call [phone] to be scheduled.  Follow-Up: At [facility], you and your health needs are our priority.  As part of our continuing mission to provide you with exceptional heart care, we have created designated Provider Care Teams.  These Care Teams include your primary Cardiologist (physician) and Advanced Practice Providers (APPs -  Physician Assistants and Nurse Practitioners) who all work together to provide you with the care you need, when you need it.  We recommend signing up for the patient portal called "MyChart".  Sign up information is provided on this After Visit Summary.  MyChart is used to connect with patients for Virtual Visits (Telemedicine).  Patients are able to view lab/test results, encounter notes, upcoming appointments, etc.  Non-urgent messages can be sent to your provider as well.   To learn more about what you can do with MyChart, go to [email or web address].    Your next appointment:   1 year(s)  Provider:   Rollene Rotunda, MD

## 2023-10-31 ENCOUNTER — Telehealth: Payer: Self-pay | Admitting: Family Medicine

## 2023-10-31 NOTE — Telephone Encounter (Signed)
Called and spoke with patient he is still having stomach pain had u/s and ct scan wants DR. G to look at results when she gets back in town also appt made

## 2023-10-31 NOTE — Telephone Encounter (Signed)
Copied from CRM [id number]. Topic: Clinical - Lab/Test Results >> Oct 31, 2023  8:34 AM Deaijah H wrote: Reason for CRM: Pt std he has a question for Dr Dallie Piles regarding his CT scan

## 2023-11-05 ENCOUNTER — Ambulatory Visit (INDEPENDENT_AMBULATORY_CARE_PROVIDER_SITE_OTHER): Payer: Medicare HMO | Admitting: Family Medicine

## 2023-11-05 ENCOUNTER — Ambulatory Visit (INDEPENDENT_AMBULATORY_CARE_PROVIDER_SITE_OTHER): Payer: Medicare HMO

## 2023-11-05 ENCOUNTER — Other Ambulatory Visit: Payer: Self-pay | Admitting: Family Medicine

## 2023-11-05 ENCOUNTER — Encounter: Payer: Self-pay | Admitting: Family Medicine

## 2023-11-05 VITALS — BP 137/76 | HR 72 | Temp 98.7°F | Ht 67.0 in | Wt 181.0 lb

## 2023-11-05 DIAGNOSIS — I774 Celiac artery compression syndrome: Secondary | ICD-10-CM | POA: Diagnosis not present

## 2023-11-05 DIAGNOSIS — R0781 Pleurodynia: Secondary | ICD-10-CM

## 2023-11-05 NOTE — Progress Notes (Signed)
Subjective: CC:Abd pain PCP: [name], DO FAO:[id number] Timothy [name] is a 82 y.o. male presenting to clinic today for:  1. Abdominal pain Here for follow up on LUQ abdominal pain that has been intermittent. He has had totally normal work up so far.  Symptoms have been referactory to PPI, H2 blocker and carafate.  He last saw GI in October and after normal CT it was recommended that he proceed with SIBO breath test if symptoms persist.  He points to the left upper rib as the source of discomfort.  He notes he did not perform the breathing test that they recommended because it was involved.  He is asking for x-ray to further evaluate his left upper rib today.  Again reports no preceding injury and again reports that pain is waxing and waning in nature but it is tender to touch. He again denies nausea, vomiting, change in appetite, night sweats, unplanned weight loss or changes in bowel habits.  He reports normal bowel movements with no blood or melena   ROS: Per HPI  Allergies  Allergen Reactions   Hydrocodone-Acetaminophen Other (See Comments)    Makes pt. 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   RIGHT NASAL SIDEWALL- NO TX   BCC (basal cell carcinoma) 11/15/2004   LEFT CHEST- TX CURET X 3, 5FU   BCC (basal cell carcinoma) 11/15/2004   RIGHT EAR- TX CURET X 3, EXCISION   BCC (basal cell carcinoma) 08/13/2005   RIGHT EAR- TX MOHS   BCC (basal cell carcinoma) 04/01/2008   SUPERFICIAL- UPPER LEFT BACK- TX CURET X 3, 5FU   BCC (basal cell carcinoma) 12/23/2012   ULCERATED- ABOVE RIGHT LIP- TX MOHS   Cataract    Chronic kidney disease    Colon polyp    Coronary artery disease    (left main normal, LAD 25-30% stenosis, distal 30-40% stenosis, circumflex obtuse marginal 50% stenosis,  right coronary artery dominant  with long 75% followed by mid 80%  stenosis followed by 90% stenosis at the ostium of the PDA.  He had  stenting of the PDA by Dr. Riley Kill.  This was a Cypher stent.   First angioplasty was 1994, Cath 2011 100% RCA ).       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   WELL DIFF- RIGHT TEMPLE-TX CURET X3,5FU   SCC (squamous cell carcinoma) 10/14/2019   WELL DIFF- RIGHT SHOULDER-TX CURET X 3, 5FU   SCC (squamous cell carcinoma) 01/24/2019   WELL DIFF- LEFT INNER CHEEK- MOHS   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   ABOVE LEFT OUTER BROW SUPERIOR- TX CURET X 3, 5FU   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   LEFT FOREHEAD-TX CURET X  3, 5FU   SCCA (squamous cell carcinoma) of skin 04/01/2008   RIGHT HAND- TX CURET X 3, 5FU   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   LEFT SIDE OF FACE- TX CURET AFTER BIOPSY   SCCA (squamous cell carcinoma) of skin 01/12/2021   Neck - anterior (in situ)   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/27/2022   Left Zygomatic Area (in situ) (tx p bx)   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/27/2022   Right Zygomatic Area (well diff)   Sleep apnea    no c-pap   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 Put on hold, Disp: 90 tablet, Rfl: 3   aspirin 81 MG EC tablet, Take 81 mg by mouth daily., Disp: , Rfl:    blood glucose meter kit and supplies, Dispense based on patient and insurance preference. Use up to four times daily as directed. (FOR ICD-10 E11.21), Disp: 1 each, Rfl: 0   Blood Glucose Monitoring Suppl (ACCU-CHEK GUIDE ME) w/Device KIT, Check BS 4 times daily E11.21, Disp: 1 kit, Rfl: 0   carvedilol (COREG) 12.5 MG tablet, Take 1 tablet (12.5 mg total) by mouth 2 (two) times daily with a meal., Disp: 180 tablet, Rfl: 3   dapagliflozin propanediol (FARXIGA) 5 MG TABS tablet, Take 5 mg by mouth daily., Disp: , Rfl:    enalapril (VASOTEC) 2.5 MG tablet, Take 1 tablet (2.5 mg total) by mouth daily., Disp: 90 tablet, Rfl: 3   esomeprazole (NEXIUM) 40 MG capsule, Take 1 capsule (40 mg total) by mouth daily. Put on hold, Disp: 90 capsule, Rfl: 3   ezetimibe (ZETIA) 10 MG tablet, TAKE ONE TABLET DAILY, Disp: 30 tablet, Rfl: 0   famotidine (PEPCID) 20 MG tablet, Take 1 tablet (20 mg total) by mouth daily., Disp: 90 tablet, Rfl: 3   glipiZIDE (GLUCOTROL) 5 MG tablet, TAKE ONE TABLET ONCE DAILY BEFORE BREAKFAST, Disp: 90 tablet, Rfl: 03   glucose blood (ACCU-CHEK GUIDE) test strip, Check BS 4 times daily E11.21, Disp: 100 each, Rfl: 11   isosorbide mononitrate (IMDUR) 60 MG 24 hr tablet, TAKE 1 TABLET DAILY, Disp: 90 tablet, Rfl: 2   latanoprost (XALATAN) 0.005 % ophthalmic solution, Place 1 drop into both eyes at bedtime., Disp: , Rfl:    nitroGLYCERIN (NITROSTAT) 0.4 MG SL tablet, Place 1 tablet (0.4 mg total) under the tongue every 5 (five) minutes as needed., Disp: 25 tablet, Rfl: 4   nystatin cream (MYCOSTATIN), Apply 1 Application topically 2 (two) times daily. Apply sparingly to the irritated area 2 x daily., Disp: 30 g, Rfl: 2   rosuvastatin (CRESTOR) 40 MG tablet, Take 1 tablet (40 mg total) by mouth daily., Disp: 90 tablet, Rfl: 3   tamsulosin (FLOMAX) 0.4 MG CAPS  capsule, Take 1 capsule (0.4 mg total) by mouth daily., Disp: 90 capsule, Rfl: 3   sucralfate (CARAFATE) 1 g tablet, Take 1 tablet (1 g total) by mouth with breakfast, with lunch, and with evening meal for 14 days., Disp: 42 tablet, Rfl: 0 Social History   Socioeconomic History   Marital status: Married    Spouse name: Charlene   Number of children: 2   Years of education: Not on file  Highest education level: Not on file  Occupational History   Occupation: retired  Tobacco Use   Smoking status: Former    Current packs/day: 0.00    Average packs/day: 2.0 packs/day for 25.0 years (50.0 ttl pk-yrs)    Types: Cigarettes    Start date: 08/13/1966    Quit date: 08/14/1991    Years since quitting: 32.2    Passive exposure: Never   Smokeless tobacco: Never  Vaping Use   Vaping status: Never Used  Substance and Sexual Activity   Alcohol use: Not Currently    Comment: rare   Drug use: No   Sexual activity: Not Currently  Other Topics Concern   Not on file  Social History Narrative   Married x 60 years in 2022.   7 grandchildren.   1 great granddaughter.    Social Determinants of Health   Financial Resource Strain: Low Risk  (11/27/2021)   Overall Financial Resource Strain (CARDIA)    Difficulty of Paying Living Expenses: Not hard at all  Food Insecurity: No Food Insecurity (11/27/2021)   Hunger Vital Sign    Worried About Running Out of Food in the Last Year: Never true    Ran Out of Food in the Last Year: Never true  Transportation Needs: No Transportation Needs (11/27/2021)   PRAPARE - Administrator, Civil Service (Medical): No    Lack of Transportation (Non-Medical): No  Physical Activity: Insufficiently Active (11/27/2021)   Exercise Vital Sign    Days of Exercise per Week: 3 days    Minutes of Exercise per Session: 20 min  Stress: No Stress Concern Present (11/27/2021)   [employer] of Occupational Health - Occupational Stress Questionnaire     Feeling of Stress : Not at all  Social Connections: Socially Integrated (11/27/2021)   Social Connection and Isolation Panel [NHANES]    Frequency of Communication with Friends and Family: More than three times a week    Frequency of Social Gatherings with Friends and Family: More than three times a week    Attends Religious Services: More than 4 times per year    Active Member of [employer] or Organizations: Yes    Attends Engineer, structural: More than 4 times per year    Marital Status: Married  Catering manager Violence: Not At Risk (11/27/2021)   Humiliation, Afraid, Rape, and Kick questionnaire    Fear of Current or Ex-Partner: No    Emotionally Abused: No    Physically Abused: No    Sexually Abused: No   Family History  Problem Relation Age of Onset   Cancer Mother        unsure of type   Glaucoma Mother    Heart disease Father    Coronary artery disease Father    Cancer Sister 41       granlocytosis (Wergerner's)   Nephritis Sister        20's   Colon cancer Brother 64   Cancer Brother        lung    Liver disease Neg Hx    Esophageal cancer Neg Hx     Objective: Office vital signs reviewed. BP 137/76   Pulse 72   Temp 98.7 F (37.1 C)   Ht 5\' 7"  (1.702 m)   Wt 181 lb (82.1 kg)   SpO2 93%   BMI 28.35 kg/m   Physical Examination:  General: Awake, alert, well nourished, No acute distress GI: Soft, nondistended, nontender abdomen MSK: Tenderness  palpation along the left inferior angle of the anterior ribs.  No palpable deformities  CT ABDOMEN PELVIS W CONTRAST  Result Date: 10/27/2023 CLINICAL DATA:  Left upper quadrant abdominal pain EXAM: CT ABDOMEN AND PELVIS WITH CONTRAST TECHNIQUE: Multidetector CT imaging of the abdomen and pelvis was performed using the standard protocol following bolus administration of intravenous contrast. RADIATION DOSE REDUCTION: This exam was performed according to the departmental dose-optimization program which includes  automated exposure control, adjustment of the mA and/or kV according to patient size and/or use of iterative reconstruction technique. CONTRAST:  OMNIPAQUE IOHEXOL 300 MG/ML  SOLN COMPARISON:  08/15/2018 FINDINGS: Lower chest: No acute abnormality of the lung bases. Heart size within normal limits. Extensive coronary artery calcifications are seen. Hepatobiliary: No focal liver abnormality is seen. Status post cholecystectomy. No biliary dilatation. Pancreas: Unremarkable. No pancreatic ductal dilatation or surrounding inflammatory changes. Spleen: Normal in size without focal abnormality. Adrenals/Urinary Tract: Adrenal glands are unremarkable. Kidneys are normal, without renal calculi, focal lesion, or hydronephrosis. Bladder is unremarkable. Stomach/Bowel: No bowel dilatation to indicate ileus or obstruction. Partially calcified ovoid mass in the left anterior pelvis (image 81, series 2) measuring 2.8 x 1.6 cm is unchanged since prior examination from 08/15/2018 which indicates a benign etiology, most likely a previously torsed epiploic appendage. Vascular/Lymphatic: No enlarged abdominal or pelvic lymph nodes. Diffuse calcified atheromatous plaque of the abdominal aorta without aneurysm or flow-limiting stenosis. Severe stenosis at the origin of the celiac artery. No significant stenosis at the origin of the superior mesenteric artery. Inferior mesenteric artery is patent. Reproductive: Mildly enlarged prostate. Other: Hernia repair mesh noted in the anterior pelvis. No recurrent hernia is identified. Musculoskeletal: No acute or significant osseous findings. IMPRESSION: No acute abnormality of the abdomen or pelvis. Electronically Signed   By: Acquanetta Belling M.D.   On: 10/27/2023 20:17    Assessment/ Plan: 82 y.o. male   Rib pain on left side - Plan: CANCELED: DG Ribs Unilateral Left  Stenosis of celiac artery (HCC) - Plan: Ambulatory referral to Vascular Surgery  I we will obtain plain films per  his request.  However, I discussed with him that I doubt that this will be revealing given a normal CAT scan of the MSK  I did review his CAT scan and there was indication that he has severe stenosis of the celiac artery.  I wonder if perhaps this may be contributing at all.  For this reason I placed a referral to vascular surgery.  I did recommend that he proceed with SIBO breath test as directed by gastroenterology   [name], DO [facility] [phone]

## 2023-11-05 NOTE — Patient Instructions (Signed)
I'm going to go ahead and refer you to the vascular specialist for that stenosis of the celiac artery.  I think it's worth exploring this as the possible etiology of your symptoms given that workup has otherwise been unremarkable.

## 2023-11-18 ENCOUNTER — Other Ambulatory Visit: Payer: Self-pay | Admitting: Internal Medicine

## 2023-11-21 ENCOUNTER — Encounter (HOSPITAL_BASED_OUTPATIENT_CLINIC_OR_DEPARTMENT_OTHER): Payer: Self-pay | Admitting: Emergency Medicine

## 2023-11-21 ENCOUNTER — Emergency Department (HOSPITAL_BASED_OUTPATIENT_CLINIC_OR_DEPARTMENT_OTHER): Payer: Medicare HMO

## 2023-11-21 ENCOUNTER — Telehealth: Payer: Self-pay | Admitting: Physician Assistant

## 2023-11-21 ENCOUNTER — Other Ambulatory Visit: Payer: Self-pay

## 2023-11-21 ENCOUNTER — Emergency Department (HOSPITAL_BASED_OUTPATIENT_CLINIC_OR_DEPARTMENT_OTHER)
Admission: EM | Admit: 2023-11-21 | Discharge: 2023-11-21 | Disposition: A | Discharge status: Home / Self Care | Payer: Medicare HMO | Attending: Emergency Medicine | Admitting: Emergency Medicine

## 2023-11-21 DIAGNOSIS — N189 Chronic kidney disease, unspecified: Secondary | ICD-10-CM | POA: Insufficient documentation

## 2023-11-21 DIAGNOSIS — I2510 Atherosclerotic heart disease of native coronary artery without angina pectoris: Secondary | ICD-10-CM | POA: Insufficient documentation

## 2023-11-21 DIAGNOSIS — Z79899 Other long term (current) drug therapy: Secondary | ICD-10-CM | POA: Insufficient documentation

## 2023-11-21 DIAGNOSIS — J9811 Atelectasis: Secondary | ICD-10-CM | POA: Diagnosis not present

## 2023-11-21 DIAGNOSIS — I129 Hypertensive chronic kidney disease with stage 1 through stage 4 chronic kidney disease, or unspecified chronic kidney disease: Secondary | ICD-10-CM | POA: Insufficient documentation

## 2023-11-21 DIAGNOSIS — Z7982 Long term (current) use of aspirin: Secondary | ICD-10-CM | POA: Diagnosis not present

## 2023-11-21 DIAGNOSIS — R1012 Left upper quadrant pain: Secondary | ICD-10-CM | POA: Insufficient documentation

## 2023-11-21 DIAGNOSIS — E1122 Type 2 diabetes mellitus with diabetic chronic kidney disease: Secondary | ICD-10-CM | POA: Diagnosis not present

## 2023-11-21 DIAGNOSIS — R1011 Right upper quadrant pain: Secondary | ICD-10-CM | POA: Diagnosis not present

## 2023-11-21 DIAGNOSIS — I700 Atherosclerosis of aorta: Secondary | ICD-10-CM | POA: Diagnosis not present

## 2023-11-21 DIAGNOSIS — N3289 Other specified disorders of bladder: Secondary | ICD-10-CM | POA: Diagnosis not present

## 2023-11-21 LAB — TROPONIN I (HIGH SENSITIVITY): Troponin I (High Sensitivity): 8 ng/L (ref <18)

## 2023-11-21 LAB — COMPREHENSIVE METABOLIC PANEL
ALT: 22 U/L (ref 0–44)
AST: 17 U/L (ref 15–41)
Albumin: 4.1 g/dL (ref 3.5–5.0)
Alkaline Phosphatase: 53 U/L (ref 38–126)
Anion gap: 9 (ref 5–15)
BUN: 18 mg/dL (ref 8–23)
CO2: 24 mmol/L (ref 22–32)
Calcium: 9.3 mg/dL (ref 8.9–10.3)
Chloride: 105 mmol/L (ref 98–111)
Creatinine, Ser: 1.37 mg/dL — ABNORMAL HIGH (ref 0.61–1.24)
GFR, Estimated: 52 mL/min — ABNORMAL LOW (ref >60)
Glucose, Bld: 244 mg/dL — ABNORMAL HIGH (ref 70–99)
Potassium: 3.8 mmol/L (ref 3.5–5.1)
Sodium: 138 mmol/L (ref 135–145)
Total Bilirubin: 0.7 mg/dL (ref <1.2)
Total Protein: 6.8 g/dL (ref 6.5–8.1)

## 2023-11-21 LAB — URINALYSIS, ROUTINE W REFLEX MICROSCOPIC
Bacteria, UA: NONE SEEN
Bilirubin Urine: NEGATIVE
Glucose, UA: >1000 mg/dL — AB
Hgb urine dipstick: NEGATIVE
Ketones, ur: NEGATIVE mg/dL
Leukocytes,Ua: NEGATIVE
Nitrite: NEGATIVE
Protein, ur: NEGATIVE mg/dL
Specific Gravity, Urine: 1.034 — ABNORMAL HIGH (ref 1.005–1.030)
pH: 5.5 (ref 5.0–8.0)

## 2023-11-21 LAB — CBC
HCT: 40.2 % (ref 39.0–52.0)
Hemoglobin: 13.9 g/dL (ref 13.0–17.0)
MCH: 30.7 pg (ref 26.0–34.0)
MCHC: 34.6 g/dL (ref 30.0–36.0)
MCV: 88.7 fL (ref 80.0–100.0)
Platelets: 129 10*3/uL — ABNORMAL LOW (ref 150–400)
RBC: 4.53 MIL/uL (ref 4.22–5.81)
RDW: 14.2 % (ref 11.5–15.5)
WBC: 5.7 10*3/uL (ref 4.0–10.5)
nRBC: 0 % (ref 0.0–0.2)

## 2023-11-21 LAB — LIPASE, BLOOD: Lipase: 59 U/L — ABNORMAL HIGH (ref 11–51)

## 2023-11-21 LAB — LACTIC ACID, PLASMA: Lactic Acid, Venous: 1.3 mmol/L (ref 0.5–1.9)

## 2023-11-21 MED ORDER — MORPHINE SULFATE (PF) 4 MG/ML IV SOLN
4.0000 mg | Freq: Once | INTRAVENOUS | Status: AC
  Administered 2023-11-21: 4 mg via INTRAVENOUS
  Filled 2023-11-21: qty 1

## 2023-11-21 MED ORDER — IOHEXOL 350 MG/ML SOLN
100.0000 mL | Freq: Once | INTRAVENOUS | Status: AC | PRN
  Administered 2023-11-21: 85 mL via INTRAVENOUS

## 2023-11-21 MED ORDER — MORPHINE SULFATE (PF) 4 MG/ML IV SOLN: 4.0000 mg | Freq: Once | INTRAVENOUS | Status: DC

## 2023-11-21 NOTE — Discharge Instructions (Addendum)
I recommend that you follow-up closely with the GI doctor who you have seen, further imaging and a GI setting could help to elucidate what is causing your pain at this time.  Additionally do recommend that you follow-up with the vascular doctor although based on your workup today I do not think that the celiac artery stenosis is the most likely contributing factor of your pain.

## 2023-11-21 NOTE — ED Notes (Signed)
Gave patient urine cup for when he is able to urinate.

## 2023-11-21 NOTE — ED Provider Notes (Signed)
[facility] Provider Note   CSN: [id number] Arrival date & time: 11/21/23  0940     History  Chief Complaint  Patient presents with   Abdominal Pain    Timothy [name] is a 82 y.o. 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  Patient has been seen and evaluated by multiple doctors including cardiologist, gastroenterologist.  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  Cardiologist does not seem to think it is related to ACS or other cardiac related cause.  Patient reports pain is severe in this moment, and was severe starting last night as well.  Not postprandial in nature.  Not associated with nausea, vomiting.   Abdominal Pain      Home Medications Prior to Admission medications   Medication Sig Start Date End Date Taking? Authorizing Provider  dorzolamide-timolol (COSOPT) 2-0.5 % ophthalmic solution Place 1 drop into both eyes 2 (two) times daily. 10/10/23  Yes [provider]  Accu-Chek Softclix Lancets lancets Check BS 4 times daily E11.21 01/04/23   Delynn Flavin M, DO  amLODipine (NORVASC) 10 MG tablet Take 1 tablet (10 mg total) by mouth daily. Put on hold 01/14/23   [name], DO  aspirin 81 MG EC tablet Take 81 mg by mouth daily.    [provider]  blood glucose meter kit and supplies Dispense based on patient and insurance preference. Use up to four times daily as directed. (FOR ICD-10 E11.21) 07/04/21   [name], DO  Blood Glucose Monitoring Suppl (ACCU-CHEK GUIDE ME) w/Device KIT Check BS 4 times daily E11.21 01/04/23   Delynn Flavin M, DO  carvedilol  (COREG) 12.5 MG tablet Take 1 tablet (12.5 mg total) by mouth 2 (two) times daily with a meal. 01/14/23   Delynn Flavin M, DO  dapagliflozin propanediol (FARXIGA) 5 MG TABS tablet Take 5 mg by mouth daily.    [provider]  enalapril (VASOTEC) 2.5 MG tablet Take 1 tablet (2.5 mg total) by mouth daily. 01/14/23   [name], DO  esomeprazole (NEXIUM) 40 MG capsule Take 1 capsule (40 mg total) by mouth daily. Put on hold 01/14/23   Delynn Flavin M, DO  ezetimibe (ZETIA) 10 MG tablet TAKE ONE TABLET DAILY (MUST KEEP APPOINTMENT) 11/21/23   Chrystie Nose, MD  famotidine (PEPCID) 20 MG tablet Take 1 tablet (20 mg total) by mouth daily. 09/17/23   [name], DO  glipiZIDE (GLUCOTROL) 5 MG tablet TAKE ONE TABLET ONCE DAILY BEFORE BREAKFAST 01/14/23   Delynn Flavin M, DO  glucose blood (ACCU-CHEK GUIDE) test strip Check BS 4 times daily E11.21 01/04/23   Delynn Flavin M, DO  isosorbide mononitrate (IMDUR) 60 MG 24 hr tablet TAKE 1 TABLET DAILY 06/13/22   Hilty, Lisette Abu, MD  latanoprost (XALATAN) 0.005 % ophthalmic solution Place 1 drop into both eyes at bedtime.    [provider]  nitroGLYCERIN (NITROSTAT) 0.4 MG SL tablet Place 1 tablet (0.4 mg total) under the tongue every 5 (five) minutes as needed. 09/14/20   Rollene Rotunda, MD  nystatin cream (MYCOSTATIN) Apply 1 Application topically 2 (two) times daily. Apply sparingly to the irritated area 2 x  daily. 04/18/23   Bjorn Pippin, MD  rosuvastatin (CRESTOR) 40 MG tablet Take 1 tablet (40 mg total) by mouth daily. 10/23/23 01/21/24  Rollene Rotunda, MD  sucralfate (CARAFATE) 1 g tablet Take 1 tablet (1 g total) by mouth with breakfast, with lunch, and with evening meal for 14 days. 09/17/23 10/23/23  [name], DO  tamsulosin (FLOMAX) 0.4 MG CAPS capsule Take 1 capsule (0.4 mg total) by mouth daily. 04/18/23   Bjorn Pippin, MD      Allergies    Hydrocodone-acetaminophen and Lipitor [atorvastatin]     Review of Systems   Review of Systems  Gastrointestinal:  Positive for abdominal pain.  All other systems reviewed and are negative.   Physical Exam Updated Vital Signs BP 120/83   Pulse 65   Temp 98 F (36.7 C)   Resp 16   SpO2 96%  Physical Exam Vitals and nursing note reviewed.  Constitutional:      General: He is not in acute distress.    Appearance: Normal appearance.  HENT:     Head: Normocephalic and atraumatic.  Eyes:     General:        Right eye: No discharge.        Left eye: No discharge.  Cardiovascular:     Rate and Rhythm: Normal rate and regular rhythm.     Heart sounds: No murmur heard.    No friction rub. No gallop.  Pulmonary:     Effort: Pulmonary effort is normal.     Breath sounds: Normal breath sounds.  Abdominal:     General: Bowel sounds are normal.     Palpations: Abdomen is soft.     Comments: Focal tenderness with guarding in the left upper quadrant, epigastric region, and lower rib cage, no rebound  Skin:    General: Skin is warm and dry.     Capillary Refill: Capillary refill takes less than 2 seconds.  Neurological:     Mental Status: He is alert and oriented to person, place, and time.  Psychiatric:        Mood and Affect: Mood normal.        Behavior: Behavior normal.     ED Results / Procedures / Treatments   Labs (all labs ordered are listed, but only abnormal results are displayed) Labs Reviewed  LIPASE, BLOOD - Abnormal; Notable for the following components:      Result Value   Lipase 59 (*)    All other components within normal limits  COMPREHENSIVE METABOLIC PANEL - Abnormal; Notable for the following components:   Glucose, Bld 244 (*)    Creatinine, Ser 1.37 (*)    GFR, Estimated 52 (*)    All other components within normal limits  CBC - Abnormal; Notable for the following components:   Platelets 129 (*)    All other components within normal limits  LACTIC ACID, PLASMA  URINALYSIS, ROUTINE W REFLEX MICROSCOPIC   TROPONIN I (HIGH SENSITIVITY)    EKG None  Radiology CT Angio Abd/Pel W and/or Wo Contrast  Result Date: 11/21/2023 CLINICAL DATA:  Intermittent right upper quadrant abdominal pain for the past 3 months. Evaluate for mesenteric ischemia. EXAM: CTA ABDOMEN AND PELVIS WITHOUT AND WITH CONTRAST TECHNIQUE: Multidetector CT imaging of the abdomen and pelvis was performed using the standard protocol during bolus administration of intravenous contrast. Multiplanar reconstructed images and MIPs were obtained and reviewed to evaluate the vascular anatomy. RADIATION DOSE REDUCTION: This exam was performed according to  the departmental dose-optimization program which includes automated exposure control, adjustment of the mA and/or kV according to patient size and/or use of iterative reconstruction technique. CONTRAST:  85mL OMNIPAQUE IOHEXOL 350 MG/ML SOLN COMPARISON:  CT abdomen and pelvis-10/02/2023; 08/15/2018 FINDINGS: VASCULAR Aorta: There is a moderate amount of mixed calcified and noncalcified atherosclerotic plaque throughout a normal caliber abdominal aorta, not resulting in hemodynamically significant stenosis. No evidence of abdominal aortic dissection or perivascular stranding. Celiac: There is a moderate amount of eccentric calcified atherosclerotic plaque involving the origin of the celiac artery approaching 50% luminal narrowing (axial image 66, series 4), coronal image 111, series 8). Conventional branching pattern. SMA: There is a minimal amount of calcified atherosclerotic plaque involving the origin of the celiac artery, not resulting in hemodynamically significant stenosis. Conventional branching pattern. The distal tributaries of the SMA appear widely patent without discrete lumen filling defect to suggest distal embolism. Renals: Solitary bilaterally; there is a minimal amount of calcified atherosclerotic plaque involving the origin of the bilateral renal arteries, not resulting in  hemodynamically significant stenosis. No vessel irregularity to suggest FMD. IMA: Diseased at its origin though remains patent. Inflow: There is a minimal amount of predominantly calcified atherosclerotic plaque involving the bilateral common iliac arteries, right-greater-than-left, not resulting in hemodynamically significant narrowing. The bilateral internal iliac arteries are diseased though patent and of normal caliber. The bilateral external iliac arteries are of normal caliber and widely patent without hemodynamically significant narrowing. Proximal Outflow: There is a minimal amount of mixed calcified and noncalcified atherosclerotic plaque involving the bilateral common femoral arteries, not resulting in hemodynamically significant narrowing. The imaged portions of the bilateral deep and superficial femoral arteries are of normal caliber and widely patent without hemodynamically significant narrowing. Veins: The IVC and pelvic venous systems appear widely patent. Review of the MIP images confirms the above findings. _________________________________________________________ NON-VASCULAR Lower chest: Limited visualization of the lower thorax demonstrates minimal dependent subpleural ground-glass atelectasis. No discrete focal airspace opacities. Normal heart size. Coronary artery calcifications. No pericardial effusion. Hepatobiliary: There is mild diffuse decreased attenuation of the hepatic parenchyma mild nodularity of the hepatic contour. No discrete hepatic lesions. Postcholecystectomy. No intra or extrahepatic biliary ductal dilatation. No ascites. Pancreas: Normal appearance of the pancreas. Spleen: Normal appearance of the spleen. Note is made of a punctate splenule. Adrenals/Urinary Tract: There is symmetric enhancement of the bilateral kidneys. No evidence of nephrolithiasis on this postcontrast examination. No discrete renal lesions. No urinary obstruction. Normal appearance of the bilateral adrenal  glands. Normal appearance of the urinary bladder given degree of distention. Stomach/Bowel: Moderate colonic stool burden without evidence of enteric obstruction. Normal appearance of the terminal ileum. The appendix is not visualized compatible with provided operative history. No significant hiatal hernia. No pneumoperitoneum, pneumatosis or portal venous gas. Lymphatic: No bulky retroperitoneal, mesenteric, pelvic or inguinal lymphadenopathy. Reproductive: Normal appearance of the prostate gland. No free fluid in the pelvic cul-de-sac. Other: Sequela of previous bilateral inguinal hernia repair. Dystrophic calcifications adjacent to the operative site likely represent areas of fat necrosis. Musculoskeletal: No acute or aggressive osseous abnormalities. 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 Mild degenerative change the bilateral hips with joint space loss, subchondral sclerosis and osteophytosis. IMPRESSION: Vascular Impression: 1. No acute findings within the abdomen or pelvis to explain patient's intermittent right upper quadrant abdominal pain. Specifically, no evidence of acute or chronic mesenteric  ischemia. 2. Moderate amount of eccentric calcified atherosclerotic plaque involving the origin of the celiac artery approaching 50% luminal narrowing, however both the SMA and IMA are patent and without hemodynamically significant narrowings. 3. Aortic Atherosclerosis (ICD10-I70.0). Nonvascular Impression: 1. Moderate colonic stool burden without evidence of enteric obstruction. 2. Post appendectomy and cholecystectomy. 3. Suspected hepatic steatosis with mild nodularity of the hepatic contour as could be seen in the setting of early cirrhotic change. Further evaluation with LFTs could be performed as indicated. Electronically Signed   By: Simonne Come M.D.    On: 11/21/2023 12:21    Procedures Procedures    Medications Ordered in ED Medications  morphine (PF) 4 MG/ML injection 4 mg (4 mg Intravenous Given 11/21/23 1134)  iohexol (OMNIPAQUE) 350 MG/ML injection 100 mL (85 mLs Intravenous Contrast Given 11/21/23 1158)    ED Course/ Medical Decision Making/ A&P                                 Medical Decision Making Amount and/or Complexity of Data Reviewed Labs: ordered. Radiology: ordered.  Risk Prescription drug management.   This patient is a 82 y.o. male  who presents to the ED for concern of abdominal pain, chest pain.   Differential diagnoses prior to evaluation: The emergent differential diagnosis includes, but is not limited to,  The causes of generalized abdominal pain include but are not limited to AAA, mesenteric ischemia, appendicitis, diverticulitis, DKA, gastritis, gastroenteritis, AMI, nephrolithiasis, pancreatitis, peritonitis, adrenal insufficiency,lead poisoning, iron toxicity, intestinal ischemia, constipation, UTI,SBO/LBO, splenic rupture, biliary disease, IBD, IBS, PUD, or hepatitis. This is not an exhaustive differential.   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 Pertinent results include: Extensively reviewed the lab work, imaging from recent evaluations by cardiologist, GI doctor in regards to the abdominal pain patient has been having for 3 months  Physical Exam: Physical exam performed. The pertinent findings include: Significant tenderness in the left upper quadrant, and epigastric region, vital signs stable otherwise  Lab Tests/Imaging studies: I personally interpreted labs/imaging and the pertinent results include: CMP notable for hyperglycemia, glucose 244, he does have a mild creatinine of 1 point but not significantly changed  from his baseline, normal is mildly elevated at 59 which could be part of the etiology of his abdominal pain if he is having some acute on chronic pancreatitis.  I independently interpreted CT angio abdomen pelvis with and without contrast which shows I agree with the radiologist interpretation.   Medications: I ordered medication including morphine for pain.  I have reviewed the patients home medicines and have made adjustments as needed.   Disposition: After consideration of the diagnostic results and the patients response to treatment, I feel that no clear evidence of an acute on chronic left upper quadrant and epigastric pain today, as discussed above he does have a mild elevation of his lipase, could consider that chronic pancreatitis may be part of the differential, additionally given the celiac artery stenosis noted on previous CT I do think that vascular follow-up is warranted.   emergency department workup does not suggest an emergent condition requiring admission or immediate intervention beyond what has been performed at this time. The plan is: as above. The patient is safe for discharge and has been instructed to return immediately for worsening symptoms, change in  symptoms or any other concerns.  Final Clinical Impression(s) / ED Diagnoses Final diagnoses:  Acute LUQ pain    Rx / DC Orders ED Discharge Orders     None         Olene Floss, PA-C 11/21/23 1309    Derwood Kaplan, MD 11/21/23 1331

## 2023-11-21 NOTE — ED Triage Notes (Signed)
Intermittent upper abd pain radiating into rib area x3 months. States he has been seen by multiple drs with no answers. States the pain was severe last night and this morning.

## 2023-11-21 NOTE — Telephone Encounter (Signed)
PT was seen in ED for abdominal pain and he says it is overbearing. He cannot be seen until February and wants to discuss symptoms. Please advise.

## 2023-11-21 NOTE — ED Notes (Signed)
Patient transported to CT 

## 2023-11-22 NOTE — Telephone Encounter (Signed)
Returned patient's call due to complaints of abdominal pain. Patient was seen in the ED on Wednesday for pain and treated, although patient states he refused the pain med that was prescribed to go home with, due to other health issues. Patient wants to be seen and the next available appt is in February. Patient became angry and states he will be going to [facility] to be seen.

## 2023-11-24 ENCOUNTER — Encounter: Payer: Self-pay | Admitting: Family Medicine

## 2023-11-24 DIAGNOSIS — M5135 Other intervertebral disc degeneration, thoracolumbar region: Secondary | ICD-10-CM

## 2023-11-24 DIAGNOSIS — R0781 Pleurodynia: Secondary | ICD-10-CM

## 2023-11-25 NOTE — Telephone Encounter (Signed)
I reviewed patient's CT angio with and without contrast with him.  He has an appoint with vascular surgery to further discuss that celiac artery which appears to be somewhat stenosed but I am not entirely sure as the cause of his left upper quadrant pain at this time.  I do again question if this is musculoskeletal in nature.  He has quite a bit of degenerative changes in that lumbar spine and I would not be surprised if this is what is present in the thoracic spine as well.  Keep appoint with vascular but I also recommended that he follow-up with his orthopedic office.  He is to get back injections and I do question if maybe he needs them again.  Perhaps he has a nerve that is being pinched on by a disc.  I will place an order for orthopedics just to that he has this for insurance purposes but he will contact [facility] following our telephone call to set up an appointment ASAP

## 2023-12-02 NOTE — Progress Notes (Signed)
[facility]   ASSESSMENT / PLAN: Timothy [name] is a 82 y.o. 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 The superior and inferior mesenteric arteries are widely patent.  Recommend:  Abstinence from all tobacco products. Blood glucose control with goal A1c < 7%. Blood pressure control with goal blood pressure < 140/90 mmHg. Lipid reduction therapy with goal LDL-C <100 mg/dL Aspirin 81mg  PO QD.  Atorvastatin 40-80mg  PO QD (or other "high intensity" statin therapy).  Clinical exam and CT angiogram findings not compatible with chronic mesenteric ischemia. Recommend continued workup for cause of pain.  CHIEF COMPLAINT: left upper quadrant abdominal pain  HISTORY OF PRESENT ILLNESS: Timothy [name] is a 82 y.o. 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  Patient has had persistent left upper quadrant abdominal and chest wall pain for several months.  The patient.  This right under his ribs and the left side and reports that the pain radiates from the back to the abdomen.  The pain is not worsened by palpation.  Pain is not worsened by eating.  He has no food fear.  He has not had unintentional weight loss.  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   RIGHT NASAL SIDEWALL- NO TX   BCC (basal cell carcinoma) 11/15/2004   LEFT CHEST- TX CURET X 3, 5FU   BCC (basal cell carcinoma) 11/15/2004   RIGHT EAR- TX CURET X 3, EXCISION   BCC (basal cell carcinoma) 08/13/2005   RIGHT EAR- TX MOHS   BCC (basal cell carcinoma) 04/01/2008   SUPERFICIAL- UPPER LEFT BACK- TX CURET X 3, 5FU   BCC (basal cell carcinoma) 12/23/2012   ULCERATED- ABOVE RIGHT LIP- TX MOHS   Cataract    Chronic kidney disease    Colon polyp    Coronary artery disease    (left main normal, LAD 25-30% stenosis, distal 30-40% stenosis, circumflex  obtuse marginal 50% stenosis,  right coronary artery dominant with long 75% followed by mid 80%  stenosis followed by 90% stenosis at the ostium of the PDA.  He had  stenting of the PDA by Dr. Riley Kill.  This was a Cypher stent.   First angioplasty was 1994, Cath 2011 100% RCA ).       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   WELL DIFF- RIGHT TEMPLE-TX CURET X3,5FU   SCC (squamous cell carcinoma) 10/14/2019   WELL DIFF- RIGHT SHOULDER-TX CURET X 3, 5FU   SCC (squamous cell carcinoma) 01/24/2019   WELL DIFF- LEFT INNER CHEEK- MOHS   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   ABOVE LEFT OUTER BROW SUPERIOR- TX CURET X 3, 5FU   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   LEFT FOREHEAD-TX CURET X 3, 5FU   SCCA (squamous cell carcinoma)  of skin 04/01/2008   RIGHT HAND- TX CURET X 3, 5FU   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   LEFT SIDE OF FACE- TX CURET AFTER BIOPSY   SCCA (squamous cell carcinoma) of skin 01/12/2021   Neck - anterior (in situ)   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/27/2022   Left Zygomatic Area (in situ) (tx p bx)   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/27/2022   Right Zygomatic Area (well diff)   Sleep apnea    no c-pap   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   in situ- dorsum of nose (CX35FU)    Past Surgical History:  Procedure Laterality Date   APPENDECTOMY     CERVICAL SPINE SURGERY     CHOLECYSTECTOMY      COLONOSCOPY  2004   multiple since   CORONARY ANGIOPLASTY WITH STENT PLACEMENT     3 sents   EYE SURGERY Bilateral    cataracts   HERNIA REPAIR Bilateral    MOHS SURGERY Right 12/2021   SKIN CANCER EXCISION  03/17/2017   SKIN CANCER EXCISION  03/17/2020   left side of face    Family History  Problem Relation Age of Onset   Cancer Mother        unsure of type   Glaucoma Mother    Heart disease Father    Coronary artery disease Father    Cancer Sister 62       granlocytosis (Wergerner's)   Nephritis Sister        20's   Colon cancer Brother 41   Cancer Brother        lung    Liver disease Neg Hx    Esophageal cancer Neg Hx     Social History   Socioeconomic History   Marital status: Married    Spouse name: Engineer, maintenance (IT)   Number of children: 2   Years of education: Not on file   Highest education level: Not on file  Occupational History   Occupation: retired  Tobacco Use   Smoking status: Former    Current packs/day: 0.00    Average packs/day: 2.0 packs/day for 25.0 years (50.0 ttl pk-yrs)    Types: Cigarettes    Start date: 08/13/1966    Quit date: 08/14/1991    Years since quitting: 32.3    Passive exposure: Never   Smokeless tobacco: Never  Vaping Use   Vaping status: Never Used  Substance and Sexual Activity   Alcohol use: Not Currently    Comment: rare   Drug use: No   Sexual activity: Not Currently  Other Topics Concern   Not on file  Social History Narrative   Married x 60 years in 2022.   7 grandchildren.   1 great granddaughter.    Social Drivers of Corporate investment banker Strain: Low Risk  (11/27/2021)   Overall Financial Resource Strain (CARDIA)    Difficulty of Paying Living Expenses: Not hard at all  Food Insecurity: No Food Insecurity (11/27/2021)   Hunger Vital Sign    Worried About Running Out of Food in the Last Year: Never true    Ran Out of Food in the Last Year: Never true  Transportation Needs: No Transportation Needs  (11/27/2021)   PRAPARE - Administrator, Civil Service (Medical): No    Lack of Transportation (Non-Medical): No  Physical Activity: Insufficiently Active (11/27/2021)   Exercise Vital Sign    Days of Exercise per Week: 3  days    Minutes of Exercise per Session: 20 min  Stress: No Stress Concern Present (11/27/2021)   [employer] of Occupational Health - Occupational Stress Questionnaire    Feeling of Stress : Not at all  Social Connections: Socially Integrated (11/27/2021)   Social Connection and Isolation Panel [NHANES]    Frequency of Communication with Friends and Family: More than three times a week    Frequency of Social Gatherings with Friends and Family: More than three times a week    Attends Religious Services: More than 4 times per year    Active Member of [employer] or Organizations: Yes    Attends Engineer, structural: More than 4 times per year    Marital Status: Married  Catering manager Violence: Not At Risk (11/27/2021)   Humiliation, Afraid, Rape, and Kick questionnaire    Fear of Current or Ex-Partner: No    Emotionally Abused: No    Physically Abused: No    Sexually Abused: No    Allergies  Allergen Reactions   Hydrocodone-Acetaminophen Other (See Comments)    Makes pt. Feel loopy   Lipitor [Atorvastatin]     myalgias    Current Outpatient Medications  Medication Sig Dispense Refill   Accu-Chek Softclix Lancets lancets Check BS 4 times daily E11.21 100 each 11   amLODipine (NORVASC) 10 MG tablet Take 1 tablet (10 mg total) by mouth daily. Put on hold 90 tablet 3   aspirin 81 MG EC tablet Take 81 mg by mouth daily.     blood glucose meter kit and supplies Dispense based on patient and insurance preference. Use up to four times daily as directed. (FOR ICD-10 E11.21) 1 each 0   Blood Glucose Monitoring Suppl (ACCU-CHEK GUIDE ME) w/Device KIT Check BS 4 times daily E11.21 1 kit 0   carvedilol (COREG) 12.5 MG tablet Take 1 tablet (12.5 mg  total) by mouth 2 (two) times daily with a meal. 180 tablet 3   dapagliflozin propanediol (FARXIGA) 5 MG TABS tablet Take 5 mg by mouth daily.     dorzolamide-timolol (COSOPT) 2-0.5 % ophthalmic solution Place 1 drop into both eyes 2 (two) times daily.     enalapril (VASOTEC) 2.5 MG tablet Take 1 tablet (2.5 mg total) by mouth daily. 90 tablet 3   esomeprazole (NEXIUM) 40 MG capsule Take 1 capsule (40 mg total) by mouth daily. Put on hold 90 capsule 3   ezetimibe (ZETIA) 10 MG tablet TAKE ONE TABLET DAILY (MUST KEEP APPOINTMENT) 90 tablet 3   famotidine (PEPCID) 20 MG tablet Take 1 tablet (20 mg total) by mouth daily. 90 tablet 3   glipiZIDE (GLUCOTROL) 5 MG tablet TAKE ONE TABLET ONCE DAILY BEFORE BREAKFAST 90 tablet 03   glucose blood (ACCU-CHEK GUIDE) test strip Check BS 4 times daily E11.21 100 each 11   isosorbide mononitrate (IMDUR) 60 MG 24 hr tablet TAKE 1 TABLET DAILY 90 tablet 2   latanoprost (XALATAN) 0.005 % ophthalmic solution Place 1 drop into both eyes at bedtime.     nitroGLYCERIN (NITROSTAT) 0.4 MG SL tablet Place 1 tablet (0.4 mg total) under the tongue every 5 (five) minutes as needed. 25 tablet 4   nystatin cream (MYCOSTATIN) Apply 1 Application topically 2 (two) times daily. Apply sparingly to the irritated area 2 x daily. 30 g 2   rosuvastatin (CRESTOR) 40 MG tablet Take 1 tablet (40 mg total) by mouth daily. 90 tablet 3   sucralfate (CARAFATE) 1 g tablet Take 1  tablet (1 g total) by mouth with breakfast, with lunch, and with evening meal for 14 days. 42 tablet 0   tamsulosin (FLOMAX) 0.4 MG CAPS capsule Take 1 capsule (0.4 mg total) by mouth daily. 90 capsule 3   No current facility-administered medications for this visit.    PHYSICAL EXAM Vitals:   12/03/23 1026  BP: (!) 155/84  Pulse: 67  SpO2: 93%  Weight: 183 lb (83 kg)  Height: 5\' 7"  (1.702 m)    Well-appearing elderly man in no acute distress Regular rate and rhythm Unlabored breathing Soft, nontender,  nondistended abdomen  PERTINENT LABORATORY AND RADIOLOGIC DATA  Most recent CBC    Latest Ref Rng & Units 11/21/2023   10:07 AM 08/28/2023    3:57 PM 01/14/2023    8:28 AM  CBC  WBC 4.0 - 10.5 K/uL 5.7  6.4  6.2   Hemoglobin 13.0 - 17.0 g/dL 41.3  24.4  01.0   Hematocrit 39.0 - 52.0 % 40.2  41.5  42.3   Platelets 150 - 400 K/uL 129  163  157      Most recent CMP    Latest Ref Rng & Units 11/21/2023   10:07 AM 08/30/2023    9:09 AM 08/28/2023    3:57 PM  CMP  Glucose 70 - 99 mg/dL 272  536  644   BUN 8 - 23 mg/dL 18  13  20    Creatinine 0.61 - 1.24 mg/dL 0.34  7.42  5.95   Sodium 135 - 145 mmol/L 138  138  138   Potassium 3.5 - 5.1 mmol/L 3.8  3.7  3.3   Chloride 98 - 111 mmol/L 105  102  102   CO2 22 - 32 mmol/L 24  20  20    Calcium 8.9 - 10.3 mg/dL 9.3  9.1  8.9   Total Protein 6.5 - 8.1 g/dL 6.8  6.6  6.6   Total Bilirubin <1.2 mg/dL 0.7  0.5  0.4   Alkaline Phos 38 - 126 U/L 53  65  72   AST 15 - 41 U/L 17  27  23    ALT 0 - 44 U/L 22  32  30     Renal function Estimated Creatinine Clearance: 42.9 mL/min (A) (by C-G formula based on SCr of 1.37 mg/dL (H)).  HB A1C (BAYER DCA - WAIVED) (%)  Date Value  09/17/2023 6.7 (H)    LDLC SERPL CALC-MCNC  Date Value Ref Range Status  07/16/2014 48 0 - 99 mg/dL Final    Comment:                              Optimal               <  100                           Above optimal     100 -  129                           Borderline        130 -  159                           High              160 -  189                           Very high             >  189 LDL-C is inaccurate if patient is non-fasting.   LDL Chol Calc (NIH)  Date Value Ref Range Status  05/15/2023 86 0 - 99 mg/dL Final   LDL-C  Date Value Ref Range Status  09/22/2016 Comment 0 - 99 mg/dL Final    Comment:    Triglyceride result indicated is too high for an accurate LDL cholesterol estimation.                           Optimal               <  100                            Above optimal     100 -  129                           Borderline        130 -  159                           High              160 -  189                           Very high             >  189 LDL-C is inaccurate if patient is non-fasting.    LDL Direct  Date Value Ref Range Status  03/01/2020 88 0 - 99 mg/dL Final    CT scan 16/12/958.  Personally reviewed.  There is mild celiac artery or sclerosis causing less than 50% stenosis.  The superior and inferior mesenteric arteries are widely patent.  There is no vascular cause for left upper quadrant pain identified on the scan.   Rande Brunt. Lenell Antu, MD [facility] Phone Number: [phone] 12/03/2023 12:36 PM   Total time spent on preparing this encounter including chart review, data review, collecting history, examining the patient, coordinating care for this new patient, 60 minutes.  Portions of this report may have been transcribed using voice recognition software.  Every effort has been made to ensure accuracy; however, inadvertent computerized transcription errors may still be present.

## 2023-12-03 ENCOUNTER — Ambulatory Visit: Payer: Medicare HMO | Admitting: Vascular Surgery

## 2023-12-03 ENCOUNTER — Encounter: Payer: Self-pay | Admitting: Vascular Surgery

## 2023-12-03 VITALS — BP 155/84 | HR 67 | Ht 67.0 in | Wt 183.0 lb

## 2023-12-03 DIAGNOSIS — I708 Atherosclerosis of other arteries: Secondary | ICD-10-CM

## 2023-12-05 DIAGNOSIS — M546 Pain in thoracic spine: Secondary | ICD-10-CM | POA: Diagnosis not present

## 2023-12-10 DIAGNOSIS — M5414 Radiculopathy, thoracic region: Secondary | ICD-10-CM | POA: Diagnosis not present

## 2024-01-02 DIAGNOSIS — G580 Intercostal neuropathy: Secondary | ICD-10-CM | POA: Diagnosis not present

## 2024-01-08 DIAGNOSIS — H40053 Ocular hypertension, bilateral: Secondary | ICD-10-CM | POA: Diagnosis not present

## 2024-01-15 ENCOUNTER — Encounter: Payer: Self-pay | Admitting: Family Medicine

## 2024-01-15 ENCOUNTER — Ambulatory Visit: Payer: Medicare HMO | Admitting: Family Medicine

## 2024-01-15 VITALS — BP 145/76 | HR 73 | Temp 98.3°F | Ht 67.0 in | Wt 180.0 lb

## 2024-01-15 DIAGNOSIS — R0781 Pleurodynia: Secondary | ICD-10-CM

## 2024-01-15 DIAGNOSIS — N183 Chronic kidney disease, stage 3 unspecified: Secondary | ICD-10-CM | POA: Diagnosis not present

## 2024-01-15 DIAGNOSIS — E1122 Type 2 diabetes mellitus with diabetic chronic kidney disease: Secondary | ICD-10-CM | POA: Diagnosis not present

## 2024-01-15 DIAGNOSIS — E119 Type 2 diabetes mellitus without complications: Secondary | ICD-10-CM | POA: Diagnosis not present

## 2024-01-15 DIAGNOSIS — I152 Hypertension secondary to endocrine disorders: Secondary | ICD-10-CM

## 2024-01-15 DIAGNOSIS — E1159 Type 2 diabetes mellitus with other circulatory complications: Secondary | ICD-10-CM

## 2024-01-15 DIAGNOSIS — E785 Hyperlipidemia, unspecified: Secondary | ICD-10-CM

## 2024-01-15 DIAGNOSIS — E1169 Type 2 diabetes mellitus with other specified complication: Secondary | ICD-10-CM

## 2024-01-15 DIAGNOSIS — Z7984 Long term (current) use of oral hypoglycemic drugs: Secondary | ICD-10-CM | POA: Diagnosis not present

## 2024-01-15 LAB — BAYER DCA HB A1C WAIVED: HB A1C (BAYER DCA - WAIVED): 7.1 % — ABNORMAL HIGH (ref 4.8–5.6)

## 2024-01-15 MED ORDER — NITROGLYCERIN 0.4 MG SL SUBL: 0.4000 mg | SUBLINGUAL_TABLET | SUBLINGUAL | 4 refills | Status: AC | PRN

## 2024-01-15 MED ORDER — AMLODIPINE BESYLATE 10 MG PO TABS: 10.0000 mg | ORAL_TABLET | Freq: Every day | ORAL | 3 refills | Status: DC

## 2024-01-15 MED ORDER — CARVEDILOL 12.5 MG PO TABS
12.5000 mg | ORAL_TABLET | Freq: Two times a day (BID) | ORAL | 3 refills | Status: DC
Start: 2024-01-15 — End: 2024-08-24

## 2024-01-15 MED ORDER — ESOMEPRAZOLE MAGNESIUM 40 MG PO CPDR: 40.0000 mg | DELAYED_RELEASE_CAPSULE | Freq: Every day | ORAL | 3 refills | Status: DC

## 2024-01-15 MED ORDER — GLIPIZIDE 5 MG PO TABS: ORAL_TABLET | ORAL | Status: DC

## 2024-01-15 NOTE — Progress Notes (Addendum)
Subjective: CC:DM PCP: Timothy Ip, DO [id number] [name] is a 83 y.o. male presenting to clinic today for:  1. Type 2 Diabetes with hypertension, hyperlipidemia and CKD3a:  Patient is compliant with all medications but has not taken his blood pressure medication today.  Diabetes Health Maintenance Due  Topic Date Due   HEMOGLOBIN A1C  03/17/2024   FOOT EXAM  05/14/2024   OPHTHALMOLOGY EXAM  06/11/2024    Last A1c:  Lab Results  Component Value Date   HGBA1C 6.7 (H) 09/17/2023    ROS: He reports no chest pain, shortness of breath, edema.  He continues to have some left upper quadrant/rib pain and he will be seeing an orthopedist soon to have an injection done.  ROS: Per HPI  Allergies  Allergen Reactions   Hydrocodone-Acetaminophen Other (See Comments)    Makes pt. 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   RIGHT NASAL SIDEWALL- NO TX   BCC (basal cell carcinoma) 11/15/2004   LEFT CHEST- TX CURET X 3, 5FU   BCC (basal cell carcinoma) 11/15/2004   RIGHT EAR- TX CURET X 3, EXCISION   BCC (basal cell carcinoma) 08/13/2005   RIGHT EAR- TX MOHS   BCC (basal cell carcinoma) 04/01/2008   SUPERFICIAL- UPPER LEFT BACK- TX CURET X 3, 5FU   BCC (basal cell carcinoma) 12/23/2012   ULCERATED- ABOVE RIGHT LIP- TX MOHS   Cataract    Chronic kidney disease    Colon polyp    Coronary artery disease    (left main normal, LAD 25-30% stenosis, distal 30-40% stenosis, circumflex obtuse marginal 50% stenosis,  right coronary artery dominant with long 75% followed by mid 80%  stenosis followed by 90% stenosis at the ostium of the PDA.  He had  stenting of the PDA by Dr. Riley Kill.  This was a Cypher stent.   First angioplasty was 1994, Cath 2011 100% RCA ).       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   WELL DIFF- RIGHT TEMPLE-TX CURET X3,5FU   SCC (squamous cell carcinoma) 10/14/2019   WELL DIFF- RIGHT SHOULDER-TX CURET X 3, 5FU   SCC (squamous cell carcinoma) 01/24/2019   WELL DIFF- LEFT INNER CHEEK- MOHS   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   ABOVE LEFT OUTER BROW SUPERIOR- TX CURET X 3, 5FU   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   LEFT FOREHEAD-TX CURET X 3, 5FU   SCCA (squamous cell carcinoma) of skin 04/01/2008   RIGHT HAND- TX CURET X 3, 5FU   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   LEFT SIDE OF FACE- TX CURET AFTER BIOPSY   SCCA (squamous cell carcinoma) of skin 01/12/2021   Neck -  anterior (in situ)   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 Use up to four times daily as directed. (FOR ICD-10 E11.21), Disp: 1 each, Rfl: 0   Blood Glucose Monitoring Suppl (ACCU-CHEK GUIDE ME) w/Device KIT, Check BS 4 times daily  E11.21, Disp: 1 kit, Rfl: 0   dapagliflozin propanediol (FARXIGA) 5 MG TABS tablet, Take 5 mg by mouth daily., Disp: , Rfl:    dorzolamide-timolol (COSOPT) 2-0.5 % ophthalmic solution, Place 1 drop into both eyes 2 (two) times daily., Disp: , Rfl:    enalapril (VASOTEC) 2.5 MG tablet, Take 1 tablet (2.5 mg total) by mouth daily., Disp: 90 tablet, Rfl: 3   ezetimibe (ZETIA) 10 MG tablet, TAKE ONE TABLET DAILY (MUST KEEP APPOINTMENT), Disp: 90 tablet, Rfl: 3   famotidine (PEPCID) 20 MG tablet, Take 1 tablet (20 mg total) by mouth daily., Disp: 90 tablet, Rfl: 3   glucose blood (ACCU-CHEK GUIDE) test strip, Check BS 4 times daily E11.21, Disp: 100 each, Rfl: 11   isosorbide mononitrate (IMDUR) 60 MG 24 hr tablet, TAKE 1 TABLET DAILY, Disp: 90 tablet, Rfl: 2   latanoprost (XALATAN) 0.005 % ophthalmic solution, Place 1 drop into both eyes at bedtime., Disp: , Rfl:    nitroGLYCERIN (NITROSTAT) 0.4 MG SL tablet, Place 1 tablet (0.4 mg total) under the tongue every 5 (five) minutes as needed., Disp: 25 tablet, Rfl: 4   nystatin cream (MYCOSTATIN), Apply 1 Application topically 2 (two) times daily. Apply sparingly to the irritated area 2 x daily., Disp: 30 g, Rfl: 2   rosuvastatin (CRESTOR) 40 MG tablet, Take 1 tablet (40 mg total) by mouth daily., Disp: 90 tablet, Rfl: 3   tamsulosin (FLOMAX) 0.4 MG CAPS capsule, Take 1 capsule (0.4 mg total) by mouth daily., Disp: 90 capsule, Rfl: 3   amLODipine (NORVASC) 10 MG tablet, Take 1 tablet (10 mg total) by mouth daily. Put on hold, Disp: 90 tablet, Rfl: 3   carvedilol (COREG) 12.5 MG tablet, Take 1 tablet (12.5 mg total) by mouth 2 (two) times daily with a meal., Disp: 180 tablet, Rfl: 3   esomeprazole (NEXIUM) 40 MG capsule, Take 1 capsule (40 mg total) by mouth daily. Put on hold, Disp: 90 capsule, Rfl: 3   glipiZIDE (GLUCOTROL) 5 MG tablet, TAKE ONE TABLET ONCE DAILY BEFORE BREAKFAST, Disp: 90 tablet, Rfl: 03   sucralfate (CARAFATE) 1 g tablet, Take 1 tablet  (1 g total) by mouth with breakfast, with lunch, and with evening meal for 14 days., Disp: 42 tablet, Rfl: 0 Social History   Socioeconomic History   Marital status: Married    Spouse name: Charlene   Number of children: 2   Years of education: Not on file   Highest education level: Not on file  Occupational History   Occupation: retired  Tobacco Use   Smoking status: Former    Current  packs/day: 0.00    Average packs/day: 2.0 packs/day for 25.0 years (50.0 ttl pk-yrs)    Types: Cigarettes    Start date: 08/13/1966    Quit date: 08/14/1991    Years since quitting: 32.4    Passive exposure: Never   Smokeless tobacco: Never  Vaping Use   Vaping status: Never Used  Substance and Sexual Activity   Alcohol use: Not Currently    Comment: rare   Drug use: No   Sexual activity: Not Currently  Other Topics Concern   Not on file  Social History Narrative   Married x 60 years in 2022.   7 grandchildren.   1 great granddaughter.    Social Drivers of Corporate investment banker Strain: Low Risk  (11/27/2021)   Overall Financial Resource Strain (CARDIA)    Difficulty of Paying Living Expenses: Not hard at all  Food Insecurity: No Food Insecurity (11/27/2021)   Hunger Vital Sign    Worried About Running Out of Food in the Last Year: Never true    Ran Out of Food in the Last Year: Never true  Transportation Needs: No Transportation Needs (11/27/2021)   PRAPARE - Administrator, Civil Service (Medical): No    Lack of Transportation (Non-Medical): No  Physical Activity: Insufficiently Active (11/27/2021)   Exercise Vital Sign    Days of Exercise per Week: 3 days    Minutes of Exercise per Session: 20 min  Stress: No Stress Concern Present (11/27/2021)   [employer] of Occupational Health - Occupational Stress Questionnaire    Feeling of Stress : Not at all  Social Connections: Socially Integrated (11/27/2021)   Social Connection and Isolation Panel [NHANES]     Frequency of Communication with Friends and Family: More than three times a week    Frequency of Social Gatherings with Friends and Family: More than three times a week    Attends Religious Services: More than 4 times per year    Active Member of [employer] or Organizations: Yes    Attends Engineer, structural: More than 4 times per year    Marital Status: Married  Catering manager Violence: Not At Risk (11/27/2021)   Humiliation, Afraid, Rape, and Kick questionnaire    Fear of Current or Ex-Partner: No    Emotionally Abused: No    Physically Abused: No    Sexually Abused: No   Family History  Problem Relation Age of Onset   Cancer Mother        unsure of type   Glaucoma Mother    Heart disease Father    Coronary artery disease Father    Cancer Sister 26       granlocytosis (Wergerner's)   Nephritis Sister        20's   Colon cancer Brother 14   Cancer Brother        lung    Liver disease Neg Hx    Esophageal cancer Neg Hx     Objective: Office vital signs reviewed. BP (!) 145/76   Pulse 73   Temp 98.3 F (36.8 C)   Ht 5\' 7"  (1.702 m)   Wt 180 lb (81.6 kg)   SpO2 95%   BMI 28.19 kg/m   Physical Examination:  General: Awake, alert, well nourished, No acute distress HEENT: Moist weakness membranes.  Ptosis of the left lower lid present Cardio: regular rate and rhythm, S1S2 heard, no murmurs appreciated Pulm: clear to auscultation bilaterally, no wheezes, rhonchi or  rales; normal work of breathing on room air Skin: dry; intact; no rashes or lesions  Assessment/ Plan: 83 y.o. male   Diabetes mellitus treated with oral medication (HCC) - Plan: Microalbumin / creatinine urine ratio, Bayer DCA Hb A1c Waived  Hyperlipidemia associated with type 2 diabetes mellitus (HCC)  Hypertension associated with type 2 diabetes mellitus (HCC) - Plan: amLODipine (NORVASC) 10 MG tablet, carvedilol (COREG) 12.5 MG tablet  CKD stage 3 due to type 2 diabetes mellitus (HCC) -  Plan: Microalbumin / creatinine urine ratio, glipiZIDE (GLUCOTROL) 5 MG tablet  Rib pain on left side  Urine microalbumin collected.  A1c demonstrates slightly elevated A1c at 7.1 but given advanced age I think this is acceptable for this patient.  No changes to medications  Continue statin, current blood pressure regimen.  Will have him return for blood pressure recheck with nurse in 2 weeks.  Advised to take medicine prior to arrival  Renal function is chronic and stable at CKD 3A.  Keep follow-up with orthopedics for rib pain.  Additionally sent a new supply of nitroglycerin as his current one has not been filled since 2021 and certainly would be out of date by now  Timothy Ip, DO [facility] [phone]

## 2024-01-16 LAB — MICROALBUMIN / CREATININE URINE RATIO
Creatinine, Urine: 48 mg/dL
Microalb/Creat Ratio: 352 mg/g{creat} — ABNORMAL HIGH (ref 0–29)
Microalbumin, Urine: 169 ug/mL

## 2024-01-21 DIAGNOSIS — G580 Intercostal neuropathy: Secondary | ICD-10-CM | POA: Diagnosis not present

## 2024-01-28 NOTE — Progress Notes (Unsigned)
Cardiology Office Note:  .   Date:  01/29/2024  ID:  Timothy [name], DOB [birth year], MRN [id number] PCP: [name], DO  [facility] Providers Cardiologist:  Rollene Rotunda, MD    Patient Profile: .      PMH Dyslipidemia Coronary artery disease Multiple prior catheterizations and PCI dating back to 1994 Hypertension Type 2 DM CKD  Referred to Advanced Lipid disorders clinic and seen by Dr. Rennis Golden October 2019.  Primary cardiologist is Dr. Antoine Poche.  At that time he had significant dyslipidemia with primary elevated LDL, triglycerides, and low HDL less than 20.  This was felt to be atypical diabetic or metabolic dyslipidemia.  Lab work at that time revealed total cholesterol 147, triglycerides 510, HDL 18, and LDL not calculated.  He had a previous NMR lipid profile with LDL particle number 1229, triglycerides > 500, and LDL small particle number 1914.  Intolerance to atorvastatin which caused hot flashes.  He was on rosuvastatin 10 mg at the time but had not tried higher doses.  He had also been prescribed fenofibrate 135 mg daily and Vascepa 2 g twice daily.  After review of pharmacy records, it was determined he was not taking the Vascepa.  He denied routine alcohol consumption but did describe somewhat of an atherogenic diet.  Lipids improved on rosuvastatin 20 mg daily, Vascepa 2 g twice daily and fenofibrate, with triglycerides in the 300s and low LDL.  At office visit 05/08/2022, he had discontinued statin although unsure as to why.  Rosuvastatin was restarted at 5 mg daily.  At last lipid clinic visit 11/13/2022 he reported having a difficult year.  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  This required numerous surgeries and radiation therapy.  His lipids were well-controlled at that time with total cholesterol 122, triglycerides 235, HDL 24, and LDL 60.  The cost of Vascepa had increased significantly at  over $1300 a month.  He planned to apply for the health will grant and if unsuccessful we would change Vascepa to generic icosapent ehtyl.   At office visit with Dr. Antoine Poche on 10/23/2023, LDL increased to 86 and rosuvastatin was increased to 40 mg daily.  He was advised to return to lipid clinic for management.        History of Present Illness: .   Timothy [name] is a very pleasant 83 y.o. male who is here today for management of dyslipidemia. He reports confusion about the appointment; he is typically seen by Dr. Antoine Poche in [place] and has seen Dr. Rennis Golden at the [facility] office but has never been her to [facility]. I clarified for him. He reports no concerning cardiac symptoms or concerning side effects from current medications. He is currently on rosuvastatin 40mg , ezetimibe 10 mg, and OTC fish oil, 2 pills every morning (unsure of dosage). He reports he is careful to note any chest discomfort due to history of heart attack and prior stent placements. He denies any current chest pain or limitations in his activities. He has a stabbing pain in his left rib area that radiates to his back for which he has received injections. He is the primary caregiver for his wife, who has poorly controlled diabetes. He cooks most meals at home, with a diet that includes a mix of healthy and less healthy options. He frequently eats bacon and fries food but uses olive oil. His wife eats a large bowl of raisin bran every  morning. He does not exercise regularly but remains quite active during the day with regular housework and yard work.  He denies dyspnea, orthopnea, PND, edema, presyncope, syncope, palpitations.  Discussed the use of AI scribe software for clinical note transcription with the patient, who gave verbal consent to proceed.   ROS: See HPI       Studies Reviewed: .        Risk Assessment/Calculations:             Physical Exam:   VS:  BP (!) 114/50 (BP Location: Left Arm, Patient Position: Sitting, Cuff  Size: Normal)   Ht 5\' 7"  (1.702 m)   Wt 179 lb (81.2 kg)   BMI 28.04 kg/m    Wt Readings from Last 3 Encounters:  01/29/24 179 lb (81.2 kg)  01/15/24 180 lb (81.6 kg)  12/03/23 183 lb (83 kg)    GEN: Well nourished, well developed in no acute distress NECK: No JVD; No carotid bruits CARDIAC: RRR, no murmurs, rubs, gallops RESPIRATORY:  Clear to auscultation without rales, wheezing or rhonchi  ABDOMEN: Soft, non-tender, non-distended EXTREMITIES:  No edema; No deformity     ASSESSMENT AND PLAN: .    Dyslipidemia LDL goal < 70: Lipid panel completed 05/15/2023 revealed total cholesterol 170, triglycerides 334, HDL 29, and LDL-C 86. Rosuvastatin was increased to 40 mg by Dr. Antoine Poche November 2024.  He has continued to take ezetimibe 10 mg daily and over-the-counter fish oil.  We will get fasting lipid panel and ALT to evaluate lipids on current therapy. Consider increasing fish oil to 2 g twice daily as well as consider application to [employer] for Vascepa. We had a lengthy discussion about healthy diet avoiding saturated fat, sugar, and simple carbohydrates and I encouraged him that reducing sugar and simple carbohydrate intake might improve his wife's blood sugar and improve triglyceride levels as well as reducing intake of saturated fat.  Continue rosuvastatin, ezetimibe.  CAD: Remote history of PCI. He denies chest pain, dyspnea, or other symptoms concerning for angina.  No indication for further ischemic evaluation at this time. No bleeding concerns. Continue aspirin.   Diabetes: A1c 7.1% on 01/15/2024.  As noted above, we had a lengthy discussion about diet.  Although I appreciate that he is advanced in age, I encouraged him to consider dietary modifications to improve glucose and triglycerides for himself, as well as his wife who struggles with hyperglycemia.        Disposition:1 year with Dr. Rennis Golden or me  Signed, Eligha Bridegroom, NP-C

## 2024-01-29 ENCOUNTER — Encounter (HOSPITAL_BASED_OUTPATIENT_CLINIC_OR_DEPARTMENT_OTHER): Payer: Self-pay | Admitting: Nurse Practitioner

## 2024-01-29 ENCOUNTER — Ambulatory Visit (HOSPITAL_BASED_OUTPATIENT_CLINIC_OR_DEPARTMENT_OTHER): Payer: Medicare HMO | Admitting: Nurse Practitioner

## 2024-01-29 VITALS — BP 114/50 | Ht 67.0 in | Wt 179.0 lb

## 2024-01-29 DIAGNOSIS — E1121 Type 2 diabetes mellitus with diabetic nephropathy: Secondary | ICD-10-CM

## 2024-01-29 DIAGNOSIS — I2510 Atherosclerotic heart disease of native coronary artery without angina pectoris: Secondary | ICD-10-CM | POA: Diagnosis not present

## 2024-01-29 DIAGNOSIS — E781 Pure hyperglyceridemia: Secondary | ICD-10-CM | POA: Diagnosis not present

## 2024-01-29 DIAGNOSIS — E785 Hyperlipidemia, unspecified: Secondary | ICD-10-CM | POA: Diagnosis not present

## 2024-01-29 NOTE — Patient Instructions (Signed)
Medication Instructions:  Your physician recommends that you continue on your current medications as directed. Please refer to the Current Medication list given to you today.  Lab Work: Your physician recommends that you return for lab work TODAY: NMR & ALT  Follow-Up: At [facility], you and your health needs are our priority.  As part of our continuing mission to provide you with exceptional heart care, we have created designated Provider Care Teams.  These Care Teams include your primary Cardiologist (physician) and Advanced Practice Providers (APPs -  Physician Assistants and Nurse Practitioners) who all work together to provide you with the care you need, when you need it.  We recommend signing up for the patient portal called "MyChart".  Sign up information is provided on this After Visit Summary.  MyChart is used to connect with patients for Virtual Visits (Telemedicine).  Patients are able to view lab/test results, encounter notes, upcoming appointments, etc.  Non-urgent messages can be sent to your provider as well.   To learn more about what you can do with MyChart, go to [email or web address].    Your next appointment:   1 year(s)  Provider:   Dr. Rennis Golden or Eligha Bridegroom, NP

## 2024-01-30 LAB — NMR, LIPOPROFILE
Cholesterol, Total: 110 mg/dL (ref 100–199)
HDL Particle Number: 28.7 umol/L — ABNORMAL LOW (ref >=30.5)
HDL-C: 31 mg/dL — ABNORMAL LOW (ref >39)
LDL Particle Number: 353 nmol/L (ref <1000)
LDL Size: 19.7 nmol — ABNORMAL LOW (ref >20.5)
LDL-C (NIH Calc): 43 mg/dL (ref 0–99)
LP-IR Score: 65 — ABNORMAL HIGH (ref <=45)
Small LDL Particle Number: 247 nmol/L (ref <=527)
Triglycerides: 223 mg/dL — ABNORMAL HIGH (ref 0–149)

## 2024-01-30 LAB — ALT: ALT: 35 [IU]/L (ref 0–44)

## 2024-02-01 ENCOUNTER — Encounter (HOSPITAL_BASED_OUTPATIENT_CLINIC_OR_DEPARTMENT_OTHER): Payer: Self-pay

## 2024-02-18 DIAGNOSIS — G580 Intercostal neuropathy: Secondary | ICD-10-CM | POA: Diagnosis not present

## 2024-02-21 ENCOUNTER — Ambulatory Visit: Admitting: Family Medicine

## 2024-02-21 ENCOUNTER — Encounter: Payer: Self-pay | Admitting: Family Medicine

## 2024-02-21 VITALS — BP 133/64 | HR 75 | Temp 97.3°F | Ht 67.0 in | Wt 182.6 lb

## 2024-02-21 DIAGNOSIS — R051 Acute cough: Secondary | ICD-10-CM

## 2024-02-21 DIAGNOSIS — J069 Acute upper respiratory infection, unspecified: Secondary | ICD-10-CM

## 2024-02-21 LAB — VERITOR FLU A/B WAIVED
Influenza A: NEGATIVE
Influenza B: NEGATIVE

## 2024-02-21 MED ORDER — BENZONATATE 100 MG PO CAPS: 100.0000 mg | ORAL_CAPSULE | Freq: Three times a day (TID) | ORAL | 0 refills | Status: DC | PRN

## 2024-02-21 MED ORDER — METHYLPREDNISOLONE ACETATE 40 MG/ML IJ SUSP
40.0000 mg | Freq: Once | INTRAMUSCULAR | Status: AC
  Administered 2024-02-21: 40 mg via INTRAMUSCULAR

## 2024-02-21 MED ORDER — AZITHROMYCIN 500 MG PO TABS: 500.0000 mg | ORAL_TABLET | Freq: Every day | ORAL | 0 refills | Status: AC

## 2024-02-21 NOTE — Progress Notes (Signed)
 Subjective:  Patient ID: Timothy [name], male    DOB: March 15, 1941, 83 y.o.   MRN: [id number]  Patient Care Team: [name], DO as PCP - General (Family Medicine) Rollene Rotunda, MD as PCP - Cardiology (Cardiology) Ernesto Rutherford, MD as Consulting Physician (Ophthalmology) Rollene Rotunda, MD as Consulting Physician (Cardiology) Coralyn Helling, MD (Inactive) as Consulting Physician (Pulmonary Disease) Janalyn Harder, MD (Inactive) as Consulting Physician (Dermatology) Danella Maiers, [facility] as Pharmacist (Family Medicine) Danella Maiers, [facility] as Pharmacist (Family Medicine) Malmfelt, Lise Auer, RN as Oncology Nurse Navigator Lonie Peak, MD as Consulting Physician (Radiation Oncology) Herma Mering, MD as Referring Physician (Dermatology)   Chief Complaint:  Cough and Nasal Congestion (X 2 days )   HPI: Timothy [name] is a 83 y.o. male presenting on 02/21/2024 for Cough and Nasal Congestion (X 2 days )   Discussed the use of AI scribe software for clinical note transcription with the patient, who gave verbal consent to proceed.  History of Present Illness   He presents with a cough. His wife has been sick with similar symptoms for about one to two weeks.  He has had a dry cough for the past two days, primarily felt 'across the top' without producing any sputum. He reports rhinorrhea and congestion. No fever, chills, confusion, or weakness. During the day, he experiences the cough but does not pay much attention to it.  The cough has significantly impacted his sleep, stating he 'did not sleep a bit' last night due to the coughing.  He has been using [facility] lemon cough drops, which he finds effective, and mentions having plenty of Mucinex at home, which he takes once a day.          Relevant past medical, surgical, family, and social history reviewed and updated as indicated.  Allergies and medications reviewed and updated. Data reviewed: Chart in Epic.   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   RIGHT NASAL SIDEWALL- NO TX   BCC (basal cell carcinoma) 11/15/2004   LEFT CHEST- TX CURET X 3, 5FU   BCC (basal cell carcinoma) 11/15/2004   RIGHT EAR- TX CURET X 3, EXCISION   BCC (basal cell carcinoma) 08/13/2005   RIGHT EAR- TX MOHS   BCC (basal cell carcinoma) 04/01/2008   SUPERFICIAL- UPPER LEFT BACK- TX CURET X 3, 5FU   BCC (basal cell carcinoma) 12/23/2012   ULCERATED- ABOVE RIGHT LIP- TX MOHS   Cataract    Chronic kidney disease    Colon polyp    Coronary artery disease    (left main normal, LAD 25-30% stenosis, distal 30-40% stenosis, circumflex obtuse marginal 50% stenosis,  right coronary artery dominant with long 75% followed by mid 80%  stenosis followed by 90% stenosis at the ostium of the PDA.  He had  stenting of the PDA by Dr. Riley Kill.  This was a Cypher stent.   First angioplasty was 1994, Cath 2011 100% RCA ).       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/27/2017   WELL DIFF- RIGHT TEMPLE-TX CURET X3,5FU   SCC (squamous cell carcinoma) 10/14/2019   WELL DIFF- RIGHT SHOULDER-TX CURET X 3, 5FU   SCC (squamous cell carcinoma) 01/24/2019   WELL DIFF- LEFT INNER CHEEK- MOHS   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   ABOVE LEFT OUTER BROW SUPERIOR- TX CURET X 3, 5FU   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    LEFT FOREHEAD-TX CURET X 3, 5FU   SCCA (squamous cell carcinoma) of skin 04/01/2008   RIGHT HAND- TX CURET X 3, 5FU   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   LEFT SIDE OF FACE- TX CURET AFTER BIOPSY   SCCA (squamous cell carcinoma) of skin 01/12/2021   Neck - anterior (in situ)   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/27/2022   Left Zygomatic Area (in situ) (tx p bx)   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/27/2022   Right Zygomatic Area (well diff)   Sleep apnea    no c-pap   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   in situ- dorsum of nose (CX35FU)    Past Surgical History:  Procedure Laterality Date   APPENDECTOMY     CERVICAL SPINE SURGERY     CHOLECYSTECTOMY     COLONOSCOPY  2004   multiple since   CORONARY ANGIOPLASTY WITH STENT PLACEMENT     3 sents   EYE SURGERY Bilateral    cataracts   HERNIA REPAIR Bilateral    MOHS SURGERY Right 12/2021   SKIN CANCER EXCISION  03/17/2017   SKIN CANCER EXCISION  03/17/2020   left side of face    Social History   Socioeconomic History   Marital status: Married    Spouse name: Engineer, maintenance (IT)   Number of children: 2   Years of education: Not on file   Highest education level: Not on file  Occupational History   Occupation: retired  Tobacco Use   Smoking status: Former    Current packs/day: 0.00    Average packs/day: 2.0 packs/day for 25.0 years (50.0 ttl pk-yrs)    Types: Cigarettes    Start date: 08/13/1966    Quit date: 08/14/1991    Years since quitting: 32.5    Passive exposure: Never   Smokeless tobacco: Never  Vaping Use   Vaping status: Never Used  Substance and Sexual Activity   Alcohol use: Not Currently    Comment: rare   Drug use: No   Sexual activity: Not Currently  Other Topics Concern   Not on file  Social History Narrative   Married x 60 years in 2022.   7 grandchildren.   1 great granddaughter.    Social Drivers of Corporate investment banker Strain: Low Risk  (11/27/2021)    Overall Financial Resource Strain (CARDIA)    Difficulty of Paying Living Expenses: Not hard at all  Food Insecurity: No Food Insecurity (11/27/2021)   Hunger Vital Sign    Worried About Running Out of Food in the Last Year: Never true    Ran Out of Food in the Last Year: Never true  Transportation Needs: No Transportation Needs (11/27/2021)   PRAPARE - Transportation    Lack of Transportation (Medical): No    Lack of  Transportation (Non-Medical): No  Physical Activity: Insufficiently Active (11/27/2021)   Exercise Vital Sign    Days of Exercise per Week: 3 days    Minutes of Exercise per Session: 20 min  Stress: No Stress Concern Present (11/27/2021)   [employer] of Occupational Health - Occupational Stress Questionnaire    Feeling of Stress : Not at all  Social Connections: Socially Integrated (11/27/2021)   Social Connection and Isolation Panel [NHANES]    Frequency of Communication with Friends and Family: More than three times a week    Frequency of Social Gatherings with Friends and Family: More than three times a week    Attends Religious Services: More than 4 times per year    Active Member of [employer] or Organizations: Yes    Attends Engineer, structural: More than 4 times per year    Marital Status: Married  Catering manager Violence: Not At Risk (11/27/2021)   Humiliation, Afraid, Rape, and Kick questionnaire    Fear of Current or Ex-Partner: No    Emotionally Abused: No    Physically Abused: No    Sexually Abused: No    Outpatient Encounter Medications as of 02/21/2024  Medication Sig   Accu-Chek Softclix Lancets lancets Check BS 4 times daily E11.21   amLODipine (NORVASC) 10 MG tablet Take 1 tablet (10 mg total) by mouth daily. Put on hold   aspirin 81 MG EC tablet Take 81 mg by mouth daily.   azithromycin (ZITHROMAX) 500 MG tablet Take 1 tablet (500 mg total) by mouth daily for 3 days.   benzonatate (TESSALON PERLES) 100 MG capsule Take 1 capsule (100  mg total) by mouth 3 (three) times daily as needed for cough.   blood glucose meter kit and supplies Dispense based on patient and insurance preference. Use up to four times daily as directed. (FOR ICD-10 E11.21)   Blood Glucose Monitoring Suppl (ACCU-CHEK GUIDE ME) w/Device KIT Check BS 4 times daily E11.21   carvedilol (COREG) 12.5 MG tablet Take 1 tablet (12.5 mg total) by mouth 2 (two) times daily with a meal.   dapagliflozin propanediol (FARXIGA) 5 MG TABS tablet Take 5 mg by mouth daily.   dorzolamide-timolol (COSOPT) 2-0.5 % ophthalmic solution Place 1 drop into both eyes 2 (two) times daily.   enalapril (VASOTEC) 2.5 MG tablet Take 1 tablet (2.5 mg total) by mouth daily.   esomeprazole (NEXIUM) 40 MG capsule Take 1 capsule (40 mg total) by mouth daily. Put on hold   ezetimibe (ZETIA) 10 MG tablet TAKE ONE TABLET DAILY (MUST KEEP APPOINTMENT)   glipiZIDE (GLUCOTROL) 5 MG tablet TAKE ONE TABLET ONCE DAILY BEFORE BREAKFAST   glucose blood (ACCU-CHEK GUIDE) test strip Check BS 4 times daily E11.21   isosorbide mononitrate (IMDUR) 60 MG 24 hr tablet TAKE 1 TABLET DAILY   latanoprost (XALATAN) 0.005 % ophthalmic solution Place 1 drop into both eyes at bedtime.   nitroGLYCERIN (NITROSTAT) 0.4 MG SL tablet Place 1 tablet (0.4 mg total) under the tongue every 5 (five) minutes as needed.   nystatin cream (MYCOSTATIN) Apply 1 Application topically 2 (two) times daily. Apply sparingly to the irritated area 2 x daily.   Omega-3 Fatty Acids (FISH OIL) 1000 MG CAPS Take 2 capsules by mouth daily.   tamsulosin (FLOMAX) 0.4 MG CAPS capsule Take 1 capsule (0.4 mg total) by mouth daily.   rosuvastatin (CRESTOR) 40 MG tablet Take 1 tablet (40 mg total) by mouth daily.   sucralfate (CARAFATE) 1 g tablet  Take 1 tablet (1 g total) by mouth with breakfast, with lunch, and with evening meal for 14 days.   No facility-administered encounter medications on file as of 02/21/2024.    Allergies  Allergen Reactions    Hydrocodone-Acetaminophen Other (See Comments)    Makes pt. Feel loopy   Lipitor [Atorvastatin]     myalgias    Pertinent ROS per HPI, otherwise unremarkable      Objective:  BP 133/64   Pulse 75   Temp (!) 97.3 F (36.3 C)   Ht 5\' 7"  (1.702 m)   Wt 182 lb 9.6 oz (82.8 kg)   SpO2 96%   BMI 28.60 kg/m    Wt Readings from Last 3 Encounters:  02/21/24 182 lb 9.6 oz (82.8 kg)  01/29/24 179 lb (81.2 kg)  01/15/24 180 lb (81.6 kg)    Physical Exam Vitals and nursing note reviewed.  Constitutional:      General: He is not in acute distress.    Appearance: Normal appearance. He is not ill-appearing, toxic-appearing or diaphoretic.  HENT:     Head: Normocephalic and atraumatic.     Right Ear: Tympanic membrane, ear canal and external ear normal.     Left Ear: Tympanic membrane, ear canal and external ear normal.     Nose: Congestion present.     Mouth/Throat:     Lips: Pink.     Mouth: Mucous membranes are moist.     Pharynx: Posterior oropharyngeal erythema and postnasal drip present. No pharyngeal swelling, oropharyngeal exudate or uvula swelling.     Tonsils: No tonsillar exudate or tonsillar abscesses.  Eyes:     Pupils: Pupils are equal, round, and reactive to light.  Cardiovascular:     Rate and Rhythm: Normal rate and regular rhythm.     Heart sounds: Normal heart sounds.  Pulmonary:     Effort: Pulmonary effort is normal.     Breath sounds: Normal breath sounds.  Musculoskeletal:     Cervical back: Neck supple.     Right lower leg: No edema.     Left lower leg: No edema.  Skin:    General: Skin is warm and dry.     Capillary Refill: Capillary refill takes less than 2 seconds.  Neurological:     General: No focal deficit present.     Mental Status: He is alert and oriented to person, place, and time.  Psychiatric:        Mood and Affect: Mood normal.        Behavior: Behavior normal. Behavior is cooperative.        Thought Content: Thought content  normal.        Judgment: Judgment normal.     Results for orders placed or performed in visit on 01/29/24  NMR, lipoprofile   Collection Time: 01/29/24 11:11 AM  Result Value Ref Range   LDL Particle Number 353 <1,000 nmol/L   LDL-C (NIH Calc) 43 0 - 99 mg/dL   HDL-C 31 (L) >78 mg/dL   Triglycerides 295 (H) 0 - 149 mg/dL   Cholesterol, Total 621 100 - 199 mg/dL   HDL Particle Number 30.8 (L) >=30.5 umol/L   Small LDL Particle Number 247 <=527 nmol/L   LDL Size 19.7 (L) >20.5 nm   LP-IR Score 65 (H) <=45  ALT   Collection Time: 01/29/24 11:11 AM  Result Value Ref Range   ALT 35 0 - 44 IU/L       Pertinent labs &  imaging results that were available during my care of the patient were reviewed by me and considered in my medical decision making.  Assessment & Plan:  Orestes Geiman" was seen today for cough and nasal congestion.  Diagnoses and all orders for this visit:  Acute cough -     Veritor Flu A/B Waived -     COVID-19, Flu A+B and RSV -     benzonatate (TESSALON PERLES) 100 MG capsule; Take 1 capsule (100 mg total) by mouth 3 (three) times daily as needed for cough. -     azithromycin (ZITHROMAX) 500 MG tablet; Take 1 tablet (500 mg total) by mouth daily for 3 days.  URI with cough and congestion -     benzonatate (TESSALON PERLES) 100 MG capsule; Take 1 capsule (100 mg total) by mouth 3 (three) times daily as needed for cough. -     azithromycin (ZITHROMAX) 500 MG tablet; Take 1 tablet (500 mg total) by mouth daily for 3 days.     Assessment and Plan    Upper Respiratory Infection Acute onset of dry cough for two days, no fever or chills, likely viral etiology. Negative flu test. Physical exam reveals drainage and erythema in the throat. Lungs clear on auscultation. Differential diagnosis includes viral upper respiratory infection. Discussed viral nature, hence antibiotics not initially recommended. Informed about Tessalon Perles for cough, Mucinex to thin secretions,  and a small dose of steroids to reduce inflammation. Advised to monitor for worsening symptoms or fever and to call if condition worsens. Prescribed Zithromax as a precautionary measure if symptoms worsen over the weekend. - Prescribe Tessalon Perles, take up to three times a day - Increase Mucinex to twice a day for the next week with adequate hydration - Administer a small dose of steroids in the office - Send prescription for Zithromax (500 mg, one pill daily for three days) as a precautionary measure - Advise to monitor for worsening symptoms or fever and call if condition worsens  Diabetes Mellitus Type 2 Blood sugar level reported as 7.2%. Advised to monitor blood sugars closely due to steroid administration. Informed about the potential impact of steroids on blood sugar levels and the importance of monitoring. - Monitor blood sugars over the next few days due to steroid administration  Follow-up - Call on Monday if symptoms worsen.          Continue all other maintenance medications.  Follow up plan: Return if symptoms worsen or fail to improve.   Continue healthy lifestyle choices, including diet (rich in fruits, vegetables, and lean proteins, and low in salt and simple carbohydrates) and exercise (at least 30 minutes of moderate physical activity daily).    The above assessment and management plan was discussed with the patient. The patient verbalized understanding of and has agreed to the management plan. Patient is aware to call the clinic if they develop any new symptoms or if symptoms persist or worsen. Patient is aware when to return to the clinic for a follow-up visit. Patient educated on when it is appropriate to go to the emergency department.   Kari Baars, FNP-C [facility] [phone]

## 2024-02-21 NOTE — Addendum Note (Signed)
 Addended by: Sonny Masters on: 02/21/2024 02:05 PM   Modules accepted: Level of Service

## 2024-02-21 NOTE — Addendum Note (Signed)
 Addended by: Lorelee Cover C on: 02/21/2024 01:25 PM   Modules accepted: Orders

## 2024-02-22 LAB — COVID-19, FLU A+B AND RSV
Influenza A, NAA: NOT DETECTED
Influenza B, NAA: NOT DETECTED
RSV, NAA: NOT DETECTED
SARS-CoV-2, NAA: NOT DETECTED

## 2024-02-23 ENCOUNTER — Encounter: Payer: Self-pay | Admitting: Family Medicine

## 2024-03-31 DIAGNOSIS — Z85820 Personal history of malignant melanoma of skin: Secondary | ICD-10-CM | POA: Diagnosis not present

## 2024-03-31 DIAGNOSIS — L821 Other seborrheic keratosis: Secondary | ICD-10-CM | POA: Diagnosis not present

## 2024-03-31 DIAGNOSIS — Z85828 Personal history of other malignant neoplasm of skin: Secondary | ICD-10-CM | POA: Diagnosis not present

## 2024-03-31 DIAGNOSIS — L814 Other melanin hyperpigmentation: Secondary | ICD-10-CM | POA: Diagnosis not present

## 2024-03-31 DIAGNOSIS — D225 Melanocytic nevi of trunk: Secondary | ICD-10-CM | POA: Diagnosis not present

## 2024-03-31 DIAGNOSIS — Z08 Encounter for follow-up examination after completed treatment for malignant neoplasm: Secondary | ICD-10-CM | POA: Diagnosis not present

## 2024-03-31 DIAGNOSIS — D485 Neoplasm of uncertain behavior of skin: Secondary | ICD-10-CM | POA: Diagnosis not present

## 2024-03-31 DIAGNOSIS — C44329 Squamous cell carcinoma of skin of other parts of face: Secondary | ICD-10-CM | POA: Diagnosis not present

## 2024-03-31 DIAGNOSIS — D0439 Carcinoma in situ of skin of other parts of face: Secondary | ICD-10-CM | POA: Diagnosis not present

## 2024-04-01 ENCOUNTER — Other Ambulatory Visit: Payer: Self-pay | Admitting: Family Medicine

## 2024-04-01 DIAGNOSIS — I152 Hypertension secondary to endocrine disorders: Secondary | ICD-10-CM

## 2024-04-10 ENCOUNTER — Other Ambulatory Visit: Payer: Self-pay | Admitting: Urology

## 2024-04-10 DIAGNOSIS — N138 Other obstructive and reflux uropathy: Secondary | ICD-10-CM

## 2024-04-14 DIAGNOSIS — D0439 Carcinoma in situ of skin of other parts of face: Secondary | ICD-10-CM | POA: Diagnosis not present

## 2024-04-14 DIAGNOSIS — C44329 Squamous cell carcinoma of skin of other parts of face: Secondary | ICD-10-CM | POA: Diagnosis not present

## 2024-04-16 HISTORY — PX: MOHS SURGERY: SUR867

## 2024-04-23 ENCOUNTER — Telehealth: Payer: Self-pay

## 2024-04-23 DIAGNOSIS — D0439 Carcinoma in situ of skin of other parts of face: Secondary | ICD-10-CM | POA: Diagnosis not present

## 2024-04-23 DIAGNOSIS — D485 Neoplasm of uncertain behavior of skin: Secondary | ICD-10-CM | POA: Diagnosis not present

## 2024-04-23 DIAGNOSIS — C44329 Squamous cell carcinoma of skin of other parts of face: Secondary | ICD-10-CM | POA: Diagnosis not present

## 2024-04-23 DIAGNOSIS — E1121 Type 2 diabetes mellitus with diabetic nephropathy: Secondary | ICD-10-CM

## 2024-04-23 NOTE — Telephone Encounter (Signed)
 Please confirm which mg pt needs to be on for his farxiga 

## 2024-04-23 NOTE — Telephone Encounter (Signed)
 Copied from CRM [id number]. Topic: Clinical - Prescription Issue >> Apr 23, 2024 12:36 PM Blair Bumpers wrote: Reason for CRM: Patient called in stating that he needs a prescription for dapagliflozin  propanediol (FARXIGA ) 5 MG TABS tablet faxed to [facility] [place]. He provided the fax #: [phone]. Patient states he was told by [facility] [place] that the provider needs to fax a new prescription before they send out the medication. Patient stated if there are any further questions or concerns, to please contact him at [phone].

## 2024-04-23 NOTE — Telephone Encounter (Signed)
 Please send refills to [facility] pharmacy once strength is verified. Part of [place] patient [place] (enrolled until 12/16/24).

## 2024-04-27 ENCOUNTER — Other Ambulatory Visit: Payer: Self-pay

## 2024-04-27 DIAGNOSIS — E1159 Type 2 diabetes mellitus with other circulatory complications: Secondary | ICD-10-CM

## 2024-04-27 MED ORDER — AMLODIPINE BESYLATE 10 MG PO TABS: 10.0000 mg | ORAL_TABLET | Freq: Once | ORAL | Status: DC

## 2024-04-27 NOTE — Telephone Encounter (Signed)
 Medication has been sent.

## 2024-04-30 ENCOUNTER — Ambulatory Visit: Payer: Medicare HMO | Admitting: Urology

## 2024-04-30 NOTE — Telephone Encounter (Signed)
 Farxiga  not prescribed by cardiology.

## 2024-04-30 NOTE — Telephone Encounter (Signed)
 Will patient continue Farxiga  with his primary or should it be routed to cardiology?

## 2024-04-30 NOTE — Telephone Encounter (Signed)
Cardio

## 2024-05-01 MED ORDER — DAPAGLIFLOZIN PROPANEDIOL 5 MG PO TABS: 5.0000 mg | ORAL_TABLET | Freq: Every day | ORAL | 3 refills | Status: DC

## 2024-05-01 NOTE — Telephone Encounter (Signed)
 RX sent

## 2024-05-01 NOTE — Addendum Note (Signed)
 Addended by: Jody Silas D on: 05/01/2024 12:54 PM   Modules accepted: Orders

## 2024-05-06 DIAGNOSIS — H40053 Ocular hypertension, bilateral: Secondary | ICD-10-CM | POA: Diagnosis not present

## 2024-05-06 DIAGNOSIS — H26491 Other secondary cataract, right eye: Secondary | ICD-10-CM | POA: Diagnosis not present

## 2024-05-06 DIAGNOSIS — H04123 Dry eye syndrome of bilateral lacrimal glands: Secondary | ICD-10-CM | POA: Diagnosis not present

## 2024-05-06 DIAGNOSIS — E119 Type 2 diabetes mellitus without complications: Secondary | ICD-10-CM | POA: Diagnosis not present

## 2024-05-06 DIAGNOSIS — Z961 Presence of intraocular lens: Secondary | ICD-10-CM | POA: Diagnosis not present

## 2024-05-06 DIAGNOSIS — H02132 Senile ectropion of right lower eyelid: Secondary | ICD-10-CM | POA: Diagnosis not present

## 2024-05-06 DIAGNOSIS — H02135 Senile ectropion of left lower eyelid: Secondary | ICD-10-CM | POA: Diagnosis not present

## 2024-05-06 LAB — HM DIABETES EYE EXAM

## 2024-05-07 DIAGNOSIS — D0439 Carcinoma in situ of skin of other parts of face: Secondary | ICD-10-CM | POA: Diagnosis not present

## 2024-05-20 DIAGNOSIS — H524 Presbyopia: Secondary | ICD-10-CM | POA: Diagnosis not present

## 2024-05-20 DIAGNOSIS — H04123 Dry eye syndrome of bilateral lacrimal glands: Secondary | ICD-10-CM | POA: Diagnosis not present

## 2024-05-20 DIAGNOSIS — H26491 Other secondary cataract, right eye: Secondary | ICD-10-CM | POA: Diagnosis not present

## 2024-05-20 DIAGNOSIS — H02135 Senile ectropion of left lower eyelid: Secondary | ICD-10-CM | POA: Diagnosis not present

## 2024-05-20 DIAGNOSIS — Z961 Presence of intraocular lens: Secondary | ICD-10-CM | POA: Diagnosis not present

## 2024-05-20 DIAGNOSIS — H40053 Ocular hypertension, bilateral: Secondary | ICD-10-CM | POA: Diagnosis not present

## 2024-05-20 DIAGNOSIS — H02132 Senile ectropion of right lower eyelid: Secondary | ICD-10-CM | POA: Diagnosis not present

## 2024-05-21 DIAGNOSIS — D0439 Carcinoma in situ of skin of other parts of face: Secondary | ICD-10-CM | POA: Diagnosis not present

## 2024-05-21 DIAGNOSIS — D485 Neoplasm of uncertain behavior of skin: Secondary | ICD-10-CM | POA: Diagnosis not present

## 2024-05-21 DIAGNOSIS — L538 Other specified erythematous conditions: Secondary | ICD-10-CM | POA: Diagnosis not present

## 2024-05-21 DIAGNOSIS — D044 Carcinoma in situ of skin of scalp and neck: Secondary | ICD-10-CM | POA: Diagnosis not present

## 2024-05-21 DIAGNOSIS — R208 Other disturbances of skin sensation: Secondary | ICD-10-CM | POA: Diagnosis not present

## 2024-05-28 DIAGNOSIS — C44329 Squamous cell carcinoma of skin of other parts of face: Secondary | ICD-10-CM | POA: Diagnosis not present

## 2024-06-11 ENCOUNTER — Ambulatory Visit: Admitting: Urology

## 2024-06-11 ENCOUNTER — Encounter: Payer: Self-pay | Admitting: Urology

## 2024-06-11 VITALS — BP 174/71 | HR 70

## 2024-06-11 DIAGNOSIS — N401 Enlarged prostate with lower urinary tract symptoms: Secondary | ICD-10-CM | POA: Diagnosis not present

## 2024-06-11 DIAGNOSIS — N138 Other obstructive and reflux uropathy: Secondary | ICD-10-CM | POA: Diagnosis not present

## 2024-06-11 DIAGNOSIS — N3941 Urge incontinence: Secondary | ICD-10-CM

## 2024-06-11 DIAGNOSIS — R339 Retention of urine, unspecified: Secondary | ICD-10-CM

## 2024-06-11 DIAGNOSIS — N481 Balanitis: Secondary | ICD-10-CM

## 2024-06-11 DIAGNOSIS — R338 Other retention of urine: Secondary | ICD-10-CM | POA: Diagnosis not present

## 2024-06-11 LAB — URINALYSIS, ROUTINE W REFLEX MICROSCOPIC
Bilirubin, UA: NEGATIVE
Ketones, UA: NEGATIVE
Leukocytes,UA: NEGATIVE
Nitrite, UA: NEGATIVE
Specific Gravity, UA: 1.01 (ref 1.005–1.030)
Urobilinogen, Ur: 0.2 mg/dL (ref 0.2–1.0)
pH, UA: 6 (ref 5.0–7.5)

## 2024-06-11 LAB — MICROSCOPIC EXAMINATION
Bacteria, UA: NONE SEEN
WBC, UA: NONE SEEN /HPF (ref 0–5)

## 2024-06-11 LAB — BLADDER SCAN AMB NON-IMAGING: Scan Result: 18

## 2024-06-11 MED ORDER — NYSTATIN 100000 UNIT/GM EX CREA
1.0000 | TOPICAL_CREAM | Freq: Two times a day (BID) | CUTANEOUS | 2 refills | Status: AC
Start: 2024-06-11 — End: ?

## 2024-06-11 MED ORDER — TAMSULOSIN HCL 0.4 MG PO CAPS
0.4000 mg | ORAL_CAPSULE | Freq: Every day | ORAL | 3 refills | Status: AC
Start: 2024-06-11 — End: ?

## 2024-06-11 NOTE — Progress Notes (Signed)
 Subjective: 1. BPH with urinary obstruction   2. Balanitis   3. Incomplete bladder emptying   4. Urge incontinence    06/11/24: Griffon returns today in f/u.  He remains on tamsulosin  for his BPH with BOO and OAB.  His IPSS is 11 with nocturia x 2.  He has some urgency but rare UUI.   His PVR remains low.  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   04/18/23: Quamir returns today in f/u.  He is on tamsulosin  for his BPH with BOO and OAB wet.  His IPSS is 12 with nocturia x 1.  He has urgency with mild but frequent incontinence with urgency.  It is worse after sitting for a while. He has had no hematuria or dysuria. He has some penile irritation that he attributes to one of his meds, Qtern , which is going to be changed at the end of the month back to Farxiga . His PVR is 19ml and his UA is clear.   10/18/22: Jehu returns today in f/u for his history of BPH with BOO and OAB wet.  He has been on silodosin .  He has a history of a fossa stricture that was dilated at cystoscopy in 11/21.  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  He had some increased voiding symptoms while getting the RTx.   His IPSS is 14 with frequency and urgency but nocturia only 1x.   He has occasion incontinence but it is insensible so he wears a single depend a day.  His PVR is 1ml.   His UA is clear but he has 3+ glucose.    08/31/21: Ansar returns today in f/u.   He remains on silodosin .  His IPSS is 11 with urgency and frequency.  His PVR is 14ml.  He has had no hematuria or dysuria.   12/29/20: Brandin returns today in f/u to assess his response the silodosin .  He is voiding better with reduce incontinence and has nocturia x 1.  His PVR today is 34ml.  He reports a good stream most of the time since the dilation of the fossa stricture.  His UA is unremarkable today.   His IPSS is 20, but his history is not consistent with the score.  He feels he empties well.  His daytime  frequency is more often than every 2 hours.  He has still has some urgency.    10/28/20: Augustin returns today in f/u from urodynamics for consideration of cystoscopy.   He continues to have significant LUTS on tamsulosin .  UA is clear today.  His IPSS is 19.  UDS results:  He had a Max capacity of with a first sensation at and a strong urge at with instability.   He generated a detrusor contraction but couldn't void with the tube in place.  He voided with a PF of 67ml/sec and a PVR of after the line was removed.  He had some increased EMG activity with voiding.   Mr. Bolin is a 83 yo male who is sent in consultation by Dr. Jolinda for BPH with LUTS.  He was previously seen by Dr. Chauncey in 2018 for incontinence.  He continues to have issues with incontinence.  When he gets the urge he will leak.  He has some insensible incontinence as well and wears depends.  He doesn't think he has SUI.  He has an adequate stream and generally feels he empties well.  He  has no intermittency.  He has minimal nocturia but only sleeps about 5 hours nightly.    He has no hematuria or dysuria.  He has had no GU surgery.  He has had one prior UTI a couple of months ago.  He has no history of stones.   He is currently on tamsulosin .  His PSA was 0.2 on 06/27/20 and that is stable for the last 6 years.  His last testosterone  level was from 9/14 and was 488.  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  He had a CT in 2019 that showed no renal issues.  The prostate was not very enlarged with a transverse diameter of 4.2cm. His PVR today is .      IPSS     Row Name 06/11/24 0900         International Prostate Symptom Score   How often have you had the sensation of not emptying your bladder? Less than half the time     How often have you had to urinate less than every two hours? Less than half the time     How often have you found you stopped and started again several times when you  urinated? Not at All     How often have you found it difficult to postpone urination? About half the time     How often have you had a weak urinary stream? Less than half the time     How often have you had to strain to start urination? Not at All     How many times did you typically get up at night to urinate? 2 Times     Total IPSS Score 11       Quality of Life due to urinary symptoms   If you were to spend the rest of your life with your urinary condition just the way it is now how would you feel about that? Mostly Satisfied           ROS:  Review of Systems  Genitourinary:  Positive for urgency.  All other systems reviewed and are negative.   Allergies  Allergen Reactions   Hydrocodone-Acetaminophen Other (See Comments)    Makes pt. 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   RIGHT NASAL SIDEWALL- NO TX   BCC (basal cell carcinoma) 11/15/2004   LEFT CHEST- TX CURET X 3, 5FU   BCC (basal cell carcinoma) 11/15/2004   RIGHT EAR- TX CURET X 3, EXCISION   BCC (basal cell carcinoma) 08/13/2005   RIGHT EAR- TX MOHS   BCC (basal cell carcinoma) 04/01/2008   SUPERFICIAL- UPPER LEFT BACK- TX CURET X 3, 5FU   BCC (basal cell carcinoma) 12/23/2012   ULCERATED- ABOVE RIGHT LIP- TX MOHS   Cataract    Chronic kidney disease    Colon polyp    Coronary artery disease    (left main normal, LAD 25-30% stenosis, distal 30-40% stenosis, circumflex obtuse marginal 50% stenosis,  right coronary artery dominant with long 75% followed by mid 80%  stenosis followed by 90% stenosis at the ostium of the PDA.  He had  stenting of the PDA by Dr. Morris.  This was a Cypher stent.   First angioplasty was 1994, Cath 2011 100% RCA ).  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    Melanoma (HCC) 12/23/2012   IN SITU- LEFT SIDEBURN-TX MOHS   Melanoma (HCC) 02/27/2017   LEFT CHEEK- TX MOHS   Melanoma of face (HCC)    Myocardial infarction (HCC)    per pt/ had mild heart attacks/has 3 stents   OSA (obstructive sleep apnea) 08/17/2016   SCC (squamous cell carcinoma) 11/16/2015   LEFT EAR RIM- TX EXCISION   SCC (squamous cell carcinoma) 02/27/2017   WELL DIFF- RIGHT TEMPLE-TX CURET X3,5FU   SCC (squamous cell carcinoma) 10/14/2019   WELL DIFF- RIGHT SHOULDER-TX CURET X 3, 5FU   SCC (squamous cell carcinoma) 01/24/2019   WELL DIFF- LEFT INNER CHEEK- MOHS   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   ABOVE LEFT OUTER BROW SUPERIOR- TX CURET X 3, 5FU   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   LEFT FOREHEAD-TX CURET X 3, 5FU   SCCA (squamous cell carcinoma) of skin 04/01/2008   RIGHT HAND- TX CURET X 3, 5FU   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   LEFT SIDE OF FACE- TX CURET AFTER BIOPSY   SCCA (squamous cell carcinoma) of skin 01/12/2021   Neck - anterior (in situ)   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/27/2022   Left Zygomatic Area (in situ) (tx p bx)   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/27/2022   Right Zygomatic Area (well diff)   Sleep apnea    no c-pap   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   in situ- dorsum of nose (CX35FU)    Past Surgical History:  Procedure Laterality Date   APPENDECTOMY     CERVICAL SPINE SURGERY     CHOLECYSTECTOMY     COLONOSCOPY  2004   multiple since   CORONARY ANGIOPLASTY WITH STENT PLACEMENT     3 sents   EYE SURGERY Bilateral    cataracts   HERNIA REPAIR Bilateral    MOHS SURGERY Right 12/2021   MOHS SURGERY  04/16/2024   He has had 4 surgeries on his forehead for melanoma   SKIN CANCER EXCISION  03/17/2017   SKIN CANCER EXCISION  03/17/2020   left side of  face    Social History   Socioeconomic History   Marital status: Married    Spouse name: Engineer, maintenance (IT)   Number of children: 2   Years of education: Not on file   Highest education level: Not on file  Occupational History   Occupation: retired  Tobacco Use   Smoking status: Former    Current packs/day: 0.00    Average packs/day: 2.0 packs/day for 25.0 years (50.0 ttl pk-yrs)    Types: Cigarettes    Start date: 08/13/1966    Quit date: 08/14/1991    Years since quitting: 32.8    Passive exposure: Never   Smokeless tobacco: Never  Vaping Use   Vaping status: Never Used  Substance and Sexual Activity   Alcohol use: Not Currently    Comment: rare   Drug use: No   Sexual activity: Not Currently  Other Topics Concern   Not on file  Social History Narrative   Married x 60 years in 2022.   7 grandchildren.   1 great granddaughter.    Social Drivers of Dispensing optician  Resource Strain: Low Risk  (11/27/2021)   Overall Financial Resource Strain (CARDIA)    Difficulty of Paying Living Expenses: Not hard at all  Food Insecurity: No Food Insecurity (11/27/2021)   Hunger Vital Sign    Worried About Running Out of Food in the Last Year: Never true    Ran Out of Food in the Last Year: Never true  Transportation Needs: No Transportation Needs (11/27/2021)   PRAPARE - Administrator, Civil Service (Medical): No    Lack of Transportation (Non-Medical): No  Physical Activity: Insufficiently Active (11/27/2021)   Exercise Vital Sign    Days of Exercise per Week: 3 days    Minutes of Exercise per Session: 20 min  Stress: No Stress Concern Present (11/27/2021)   [employer] of Occupational Health - Occupational Stress Questionnaire    Feeling of Stress : Not at all  Social Connections: Socially Integrated (11/27/2021)   Social Connection and Isolation Panel    Frequency of Communication with Friends and Family: More than three times a week    Frequency of Social  Gatherings with Friends and Family: More than three times a week    Attends Religious Services: More than 4 times per year    Active Member of [employer] or Organizations: Yes    Attends Engineer, structural: More than 4 times per year    Marital Status: Married  Catering manager Violence: Not At Risk (11/27/2021)   Humiliation, Afraid, Rape, and Kick questionnaire    Fear of Current or Ex-Partner: No    Emotionally Abused: No    Physically Abused: No    Sexually Abused: No    Family History  Problem Relation Age of Onset   Cancer Mother        unsure of type   Glaucoma Mother    Heart disease Father    Coronary artery disease Father    Cancer Sister 29       granlocytosis (Wergerner's)   Nephritis Sister        20's   Colon cancer Brother 60   Cancer Brother        lung    Liver disease Neg Hx    Esophageal cancer Neg Hx     Anti-infectives: Anti-infectives (From admission, onward)    None       Current Outpatient Medications  Medication Sig Dispense Refill   Accu-Chek Softclix Lancets lancets Check BS 4 times daily E11.21 100 each 11   amLODipine  (NORVASC ) 10 MG tablet Take 1 tablet (10 mg total) by mouth daily. Put on hold 90 tablet 3   aspirin  81 MG EC tablet Take 81 mg by mouth daily.     benzonatate  (TESSALON  PERLES) 100 MG capsule Take 1 capsule (100 mg total) by mouth 3 (three) times daily as needed for cough. 30 capsule 0   blood glucose meter kit and supplies Dispense based on patient and insurance preference. Use up to four times daily as directed. (FOR ICD-10 E11.21) 1 each 0   Blood Glucose Monitoring Suppl (ACCU-CHEK GUIDE ME) w/Device KIT Check BS 4 times daily E11.21 1 kit 0   carvedilol  (COREG ) 12.5 MG tablet Take 1 tablet (12.5 mg total) by mouth 2 (two) times daily with a meal. 180 tablet 3   dapagliflozin  propanediol (FARXIGA ) 5 MG TABS tablet Take 1 tablet (5 mg total) by mouth daily. 90 tablet 3   dorzolamide-timolol (COSOPT) 2-0.5 %  ophthalmic solution Place 1 drop into both eyes  2 (two) times daily.     enalapril  (VASOTEC ) 2.5 MG tablet TAKE 1 TABLET DAILY 90 tablet 0   esomeprazole  (NEXIUM ) 40 MG capsule Take 1 capsule (40 mg total) by mouth daily. Put on hold 90 capsule 3   ezetimibe  (ZETIA ) 10 MG tablet TAKE ONE TABLET DAILY (MUST KEEP APPOINTMENT) 90 tablet 3   glipiZIDE  (GLUCOTROL ) 5 MG tablet TAKE ONE TABLET ONCE DAILY BEFORE BREAKFAST 90 tablet 03   glucose blood (ACCU-CHEK GUIDE) test strip Check BS 4 times daily E11.21 100 each 11   isosorbide  mononitrate (IMDUR ) 60 MG 24 hr tablet TAKE 1 TABLET DAILY 90 tablet 2   latanoprost  (XALATAN ) 0.005 % ophthalmic solution Place 1 drop into both eyes at bedtime.     nitroGLYCERIN  (NITROSTAT ) 0.4 MG SL tablet Place 1 tablet (0.4 mg total) under the tongue every 5 (five) minutes as needed. 25 tablet 4   Omega-3 Fatty Acids (FISH OIL) 1000 MG CAPS Take 2 capsules by mouth daily.     nystatin  cream (MYCOSTATIN ) Apply 1 Application topically 2 (two) times daily. Apply sparingly to the irritated area 2 x daily. 30 g 2   rosuvastatin  (CRESTOR ) 40 MG tablet Take 1 tablet (40 mg total) by mouth daily. 90 tablet 3   sucralfate  (CARAFATE ) 1 g tablet Take 1 tablet (1 g total) by mouth with breakfast, with lunch, and with evening meal for 14 days. 42 tablet 0   tamsulosin  (FLOMAX ) 0.4 MG CAPS capsule Take 1 capsule (0.4 mg total) by mouth daily. 90 capsule 3   Current Facility-Administered Medications  Medication Dose Route Frequency Provider Last Rate Last Admin   amLODipine  (NORVASC ) tablet 10 mg  10 mg Oral Once Gottschalk, Ashly M, DO         Objective: Vital signs in last 24 hours: BP (!) 174/71   Pulse 70   Intake/Output from previous day: No intake/output data recorded. Intake/Output this shift: @IOTHISSHIFT @   Physical Exam Vitals reviewed.  Constitutional:      Appearance: Normal appearance.  Genitourinary:    Comments: Circumcised with stable  erythema on the  lateral and posterior coronal sulcus.    Neurological:     Mental Status: He is alert.     Lab Results:  Results for orders placed or performed in visit on 06/11/24 (from the past 24 hours)  Urinalysis, Routine w reflex microscopic     Status: Abnormal   Collection Time: 06/11/24  8:58 AM  Result Value Ref Range   Specific Gravity, UA 1.010 1.005 - 1.030   pH, UA 6.0 5.0 - 7.5   Color, UA Yellow Yellow   Appearance Ur Clear Clear   Leukocytes,UA Negative Negative   Protein,UA Trace Negative/Trace   Glucose, UA 3+ (A) Negative   Ketones, UA Negative Negative   RBC, UA Trace (A) Negative   Bilirubin, UA Negative Negative   Urobilinogen, Ur 0.2 0.2 - 1.0 mg/dL   Nitrite, UA Negative Negative   Microscopic Examination See below:    Narrative   Performed at:  [address] - [facility] [address], [place], [place]  [id number] Lab Director: Cherlyn Ore MT, Phone:  [phone]  Microscopic Examination     Status: None   Collection Time: 06/11/24  8:58 AM   Urine  Result Value Ref Range   WBC, UA None seen 0 - 5 /hpf   RBC, Urine 0-2 0 - 2 /hpf   Epithelial Cells (non renal) 0-10 0 - 10 /hpf   Bacteria,  UA None seen None seen/Few   Narrative   Performed at:  [address] - [facility] [address], [place], [place]  [id number] Lab Director: Cherlyn Ore MT, Phone:  [phone]       BMET No results for input(s): NA, K, CL, CO2, GLUCOSE, BUN, CREATININE, CALCIUM  in the last 72 hours. PT/INR No results for input(s): LABPROT, INR in the last 72 hours. ABG No results for input(s): PHART, HCO3 in the last 72 hours.  Invalid input(s): PCO2, PO2 UA is clear.  Studies/Results:   Assessment/Plan: BPH with BOO and UUI with incomplete emptying.  He is doing ok on tamsulosin .   Balanitis.  I will refilled the nystatin  cream.   Urethral stricture of the fossa.  This was dilated for scope passage in the past.  He continues to have a good  stream.     Meds ordered this encounter  Medications   nystatin  cream (MYCOSTATIN )    Sig: Apply 1 Application topically 2 (two) times daily. Apply sparingly to the irritated area 2 x daily.    Dispense:  30 g    Refill:  2   tamsulosin  (FLOMAX ) 0.4 MG CAPS capsule    Sig: Take 1 capsule (0.4 mg total) by mouth daily.    Dispense:  90 capsule    Refill:  3      Orders Placed This Encounter  Procedures   Microscopic Examination   Urinalysis, Routine w reflex microscopic   BLADDER SCAN AMB NON-IMAGING     Return in about 1 year (around 06/11/2025) for any available provider..  CC: Dr. Rosina Fielding.       Norleen Seltzer 06/12/2024 [phone] ID: Elsie Lunger, male   DOB: 01/26/1941, 83 y.o.   MRN: [id number] Patient ID: Zelig Gacek, male   DOB: 02-17-1941, 83 y.o.   MRN: [id number] Patient ID: Vincenzo Stave, male   DOB: 20-Feb-1941, 83 y.o.   MRN: [id number]

## 2024-06-11 NOTE — Progress Notes (Signed)
 Bladder Scan completed today.  Patient can void prior to the bladder scan. Bladder scan result: 18  Performed By: Exie DASEN. CMA

## 2024-06-17 ENCOUNTER — Ambulatory Visit: Payer: Self-pay

## 2024-06-17 DIAGNOSIS — M79642 Pain in left hand: Secondary | ICD-10-CM | POA: Diagnosis not present

## 2024-06-17 DIAGNOSIS — M25511 Pain in right shoulder: Secondary | ICD-10-CM | POA: Diagnosis not present

## 2024-06-17 NOTE — Telephone Encounter (Signed)
 [facility] is going to [facility] to be evaluated.

## 2024-06-17 NOTE — Telephone Encounter (Signed)
 FYI Only or Action Required?: Action required by provider: request for appointment.  Patient was last seen in primary care on 02/21/2024 by Severa Rock HERO, FNP. Called Nurse Triage reporting Pain. Symptoms began yesterday. Interventions attempted: OTC medications: advil. Symptoms are: unchanged.  Triage Disposition: See Physician Within 24 Hours  Patient/caregiver understands and will follow disposition?: No, wishes to speak with PCP    Doesn't want to go to UC, wants an xray of his hand.     Copied from CRM [id number]. Topic: Clinical - Red Word Triage >> Jun 17, 2024  8:30 AM Powell HERO wrote: Red Word that prompted transfer to Nurse Triage: Patient fell yesterday afternoon, hurt his left thumb and his right arm shoulder area. He states he can move his thump up and down but it hurts so bad he can't pick anything up or use his thumb. His shoulder hurts but he does have pain moving it around. Feels pretty bruised. Reason for Disposition  Finger joint can't be opened (straightened) or closed (bent) completely  (Note: injured person should be able to do this without assistance)  Answer Assessment - Initial Assessment Questions 1. MECHANISM: How did the injury happen?      Fell  2. ONSET: When did the injury happen? (Minutes or hours ago)      yesterday 3. LOCATION: What part of the finger is injured? Is the nail damaged?      Left thumb 4. APPEARANCE of the INJURY: What does the injury look like?      Swollen  5. SEVERITY: Can you use the hand normally?  Can you bend your fingers into a ball and then fully open them?     Cannot ball hand into fist 6. SIZE: For cuts, bruises, or swelling, ask: How large is it? (e.g., inches or centimeters;  entire finger)      no 7. PAIN: Is there pain? If Yes, ask: How bad is the pain?    (e.g., Scale 1-10; or mild, moderate, severe)  - NONE (0): no pain.  - MILD (1-3): doesn't interfere with normal activities.   - MODERATE (4-7):  interferes with normal activities or awakens from sleep.  - SEVERE (8-10): excruciating pain, unable to hold a glass of water or bend finger even a little.     severe 8. TETANUS: For any breaks in the skin, ask: When was the last tetanus booster?      Yes multiple and should be up to date 9. OTHER SYMPTOMS: Do you have any other symptoms?     Swelling and shoulder pain  Protocols used: Finger Injury-A-AH

## 2024-06-22 DIAGNOSIS — M19032 Primary osteoarthritis, left wrist: Secondary | ICD-10-CM | POA: Diagnosis not present

## 2024-06-22 DIAGNOSIS — S40011A Contusion of right shoulder, initial encounter: Secondary | ICD-10-CM | POA: Diagnosis not present

## 2024-07-06 DIAGNOSIS — M1811 Unilateral primary osteoarthritis of first carpometacarpal joint, right hand: Secondary | ICD-10-CM | POA: Diagnosis not present

## 2024-07-06 DIAGNOSIS — M79642 Pain in left hand: Secondary | ICD-10-CM | POA: Diagnosis not present

## 2024-07-06 DIAGNOSIS — S60212A Contusion of left wrist, initial encounter: Secondary | ICD-10-CM | POA: Diagnosis not present

## 2024-07-06 DIAGNOSIS — M19032 Primary osteoarthritis, left wrist: Secondary | ICD-10-CM | POA: Diagnosis not present

## 2024-07-06 DIAGNOSIS — S40011A Contusion of right shoulder, initial encounter: Secondary | ICD-10-CM | POA: Diagnosis not present

## 2024-07-09 ENCOUNTER — Other Ambulatory Visit: Payer: Self-pay | Admitting: Family Medicine

## 2024-07-09 DIAGNOSIS — R1013 Epigastric pain: Secondary | ICD-10-CM

## 2024-07-09 DIAGNOSIS — E1159 Type 2 diabetes mellitus with other circulatory complications: Secondary | ICD-10-CM

## 2024-08-03 DIAGNOSIS — S60212A Contusion of left wrist, initial encounter: Secondary | ICD-10-CM | POA: Diagnosis not present

## 2024-08-03 DIAGNOSIS — S40011A Contusion of right shoulder, initial encounter: Secondary | ICD-10-CM | POA: Diagnosis not present

## 2024-08-03 DIAGNOSIS — M1812 Unilateral primary osteoarthritis of first carpometacarpal joint, left hand: Secondary | ICD-10-CM | POA: Diagnosis not present

## 2024-08-03 DIAGNOSIS — M75101 Unspecified rotator cuff tear or rupture of right shoulder, not specified as traumatic: Secondary | ICD-10-CM | POA: Diagnosis not present

## 2024-08-05 ENCOUNTER — Other Ambulatory Visit: Payer: Self-pay | Admitting: Family Medicine

## 2024-08-05 DIAGNOSIS — L821 Other seborrheic keratosis: Secondary | ICD-10-CM | POA: Diagnosis not present

## 2024-08-05 DIAGNOSIS — D0439 Carcinoma in situ of skin of other parts of face: Secondary | ICD-10-CM | POA: Diagnosis not present

## 2024-08-05 DIAGNOSIS — L814 Other melanin hyperpigmentation: Secondary | ICD-10-CM | POA: Diagnosis not present

## 2024-08-05 DIAGNOSIS — Z86007 Personal history of in-situ neoplasm of skin: Secondary | ICD-10-CM | POA: Diagnosis not present

## 2024-08-05 DIAGNOSIS — Z85828 Personal history of other malignant neoplasm of skin: Secondary | ICD-10-CM | POA: Diagnosis not present

## 2024-08-05 DIAGNOSIS — D485 Neoplasm of uncertain behavior of skin: Secondary | ICD-10-CM | POA: Diagnosis not present

## 2024-08-05 DIAGNOSIS — Z08 Encounter for follow-up examination after completed treatment for malignant neoplasm: Secondary | ICD-10-CM | POA: Diagnosis not present

## 2024-08-05 DIAGNOSIS — C4442 Squamous cell carcinoma of skin of scalp and neck: Secondary | ICD-10-CM | POA: Diagnosis not present

## 2024-08-05 DIAGNOSIS — D1801 Hemangioma of skin and subcutaneous tissue: Secondary | ICD-10-CM | POA: Diagnosis not present

## 2024-08-10 ENCOUNTER — Other Ambulatory Visit: Payer: Self-pay | Admitting: Family Medicine

## 2024-08-10 DIAGNOSIS — E1159 Type 2 diabetes mellitus with other circulatory complications: Secondary | ICD-10-CM

## 2024-08-11 MED ORDER — ENALAPRIL MALEATE 2.5 MG PO TABS: 2.5000 mg | ORAL_TABLET | Freq: Every day | ORAL | 0 refills | Status: DC

## 2024-08-11 NOTE — Addendum Note (Signed)
 Addended by: Fitzroy Mikami D on: 08/11/2024 11:13 AM   Modules accepted: Orders

## 2024-08-11 NOTE — Telephone Encounter (Signed)
 gottschalk pt NTBS 30-d given 07/09/24

## 2024-08-11 NOTE — Telephone Encounter (Signed)
 I called pt & made him an appt w/Dr G on 08-24-2024 at 10am for Med refill.

## 2024-08-24 ENCOUNTER — Encounter: Payer: Self-pay | Admitting: Family Medicine

## 2024-08-24 ENCOUNTER — Ambulatory Visit (INDEPENDENT_AMBULATORY_CARE_PROVIDER_SITE_OTHER): Admitting: Family Medicine

## 2024-08-24 VITALS — BP 138/58 | HR 68 | Temp 97.7°F | Ht 67.0 in | Wt 178.4 lb

## 2024-08-24 DIAGNOSIS — E1122 Type 2 diabetes mellitus with diabetic chronic kidney disease: Secondary | ICD-10-CM | POA: Diagnosis not present

## 2024-08-24 DIAGNOSIS — Z0001 Encounter for general adult medical examination with abnormal findings: Secondary | ICD-10-CM | POA: Diagnosis not present

## 2024-08-24 DIAGNOSIS — Z23 Encounter for immunization: Secondary | ICD-10-CM

## 2024-08-24 DIAGNOSIS — C44320 Squamous cell carcinoma of skin of unspecified parts of face: Secondary | ICD-10-CM

## 2024-08-24 DIAGNOSIS — Z7984 Long term (current) use of oral hypoglycemic drugs: Secondary | ICD-10-CM

## 2024-08-24 DIAGNOSIS — E119 Type 2 diabetes mellitus without complications: Secondary | ICD-10-CM

## 2024-08-24 DIAGNOSIS — E1169 Type 2 diabetes mellitus with other specified complication: Secondary | ICD-10-CM

## 2024-08-24 DIAGNOSIS — E1159 Type 2 diabetes mellitus with other circulatory complications: Secondary | ICD-10-CM

## 2024-08-24 DIAGNOSIS — N183 Chronic kidney disease, stage 3 unspecified: Secondary | ICD-10-CM

## 2024-08-24 DIAGNOSIS — K219 Gastro-esophageal reflux disease without esophagitis: Secondary | ICD-10-CM

## 2024-08-24 DIAGNOSIS — E785 Hyperlipidemia, unspecified: Secondary | ICD-10-CM | POA: Diagnosis not present

## 2024-08-24 DIAGNOSIS — I152 Hypertension secondary to endocrine disorders: Secondary | ICD-10-CM | POA: Diagnosis not present

## 2024-08-24 DIAGNOSIS — Z0000 Encounter for general adult medical examination without abnormal findings: Secondary | ICD-10-CM

## 2024-08-24 LAB — BAYER DCA HB A1C WAIVED: HB A1C (BAYER DCA - WAIVED): 7.5 % — ABNORMAL HIGH (ref 4.8–5.6)

## 2024-08-24 LAB — LIPID PANEL

## 2024-08-24 MED ORDER — ESOMEPRAZOLE MAGNESIUM 40 MG PO CPDR: 40.0000 mg | DELAYED_RELEASE_CAPSULE | Freq: Every day | ORAL | 3 refills | Status: AC

## 2024-08-24 MED ORDER — DAPAGLIFLOZIN PROPANEDIOL 5 MG PO TABS: 5.0000 mg | ORAL_TABLET | Freq: Every day | ORAL | 3 refills | Status: DC

## 2024-08-24 MED ORDER — ACCU-CHEK GUIDE TEST VI STRP: ORAL_STRIP | 11 refills | Status: AC

## 2024-08-24 MED ORDER — AMLODIPINE BESYLATE 10 MG PO TABS: 10.0000 mg | ORAL_TABLET | Freq: Every day | ORAL | 3 refills | Status: AC

## 2024-08-24 MED ORDER — CARVEDILOL 12.5 MG PO TABS: 12.5000 mg | ORAL_TABLET | Freq: Two times a day (BID) | ORAL | 3 refills | Status: AC

## 2024-08-24 MED ORDER — EZETIMIBE 10 MG PO TABS: 10.0000 mg | ORAL_TABLET | Freq: Every day | ORAL | 3 refills | Status: AC

## 2024-08-24 MED ORDER — ACCU-CHEK SOFTCLIX LANCETS MISC
11 refills | Status: AC
Start: 2024-08-24 — End: ?

## 2024-08-24 MED ORDER — ENALAPRIL MALEATE 2.5 MG PO TABS: 2.5000 mg | ORAL_TABLET | Freq: Every day | ORAL | 3 refills | Status: AC

## 2024-08-24 MED ORDER — ROSUVASTATIN CALCIUM 40 MG PO TABS: 40.0000 mg | ORAL_TABLET | Freq: Every day | ORAL | 3 refills | Status: AC

## 2024-08-24 NOTE — Addendum Note (Signed)
 Addended by: SHERRE SUZEN PARAS on: 08/24/2024 10:47 AM   Modules accepted: Orders

## 2024-08-24 NOTE — Patient Instructions (Signed)
 Preventive Care 73 Years and Older, Male Preventive care refers to lifestyle choices and visits with your health care provider that can promote health and wellness. Preventive care visits are also called wellness exams. What can I expect for my preventive care visit? Counseling During your preventive care visit, your health care provider may ask about your: Medical history, including: Past medical problems. Family medical history. History of falls. Current health, including: Emotional well-being. Home life and relationship well-being. Sexual activity. Memory and ability to understand (cognition). Lifestyle, including: Alcohol, nicotine or tobacco, and drug use. Access to firearms. Diet, exercise, and sleep habits. Work and work Astronomer. Sunscreen use. Safety issues such as seatbelt and bike helmet use. Physical exam Your health care provider will check your: Height and weight. These may be used to calculate your BMI (body mass index). BMI is a measurement that tells if you are at a healthy weight. Waist circumference. This measures the distance around your waistline. This measurement also tells if you are at a healthy weight and may help predict your risk of certain diseases, such as type 2 diabetes and high blood pressure. Heart rate and blood pressure. Body temperature. Skin for abnormal spots. What immunizations do I need?  Vaccines are usually given at various ages, according to a schedule. Your health care provider will recommend vaccines for you based on your age, medical history, and lifestyle or other factors, such as travel or where you work. What tests do I need? Screening Your health care provider may recommend screening tests for certain conditions. This may include: Lipid and cholesterol levels. Diabetes screening. This is done by checking your blood sugar (glucose) after you have not eaten for a while (fasting). Hepatitis C test. Hepatitis B test. HIV (human  immunodeficiency virus) test. STI (sexually transmitted infection) testing, if you are at risk. Lung cancer screening. Colorectal cancer screening. Prostate cancer screening. Abdominal aortic aneurysm (AAA) screening. You may need this if you are a current or former smoker. Talk with your health care provider about your test results, treatment options, and if necessary, the need for more tests. Follow these instructions at home: Eating and drinking  Eat a diet that includes fresh fruits and vegetables, whole grains, lean protein, and low-fat dairy products. Limit your intake of foods with high amounts of sugar, saturated fats, and salt. Take vitamin and mineral supplements as recommended by your health care provider. Do not drink alcohol if your health care provider tells you not to drink. If you drink alcohol: Limit how much you have to 0-2 drinks a day. Know how much alcohol is in your drink. In the [place] one drink equals one 12 oz bottle of beer (355 mL), one 5 oz glass of wine (148 mL), or one 1 oz glass of hard liquor (44 mL). Lifestyle Brush your teeth every morning and night with fluoride toothpaste. Floss one time each day. Exercise for at least 30 minutes 5 or more days each week. Do not use any products that contain nicotine or tobacco. These products include cigarettes, chewing tobacco, and vaping devices, such as e-cigarettes. If you need help quitting, ask your health care provider. Do not use drugs. If you are sexually active, practice safe sex. Use a condom or other form of protection to prevent STIs. Take aspirin only as told by your health care provider. Make sure that you understand how much to take and what form to take. Work with your health care provider to find out whether it is safe  and beneficial for you to take aspirin daily. Ask your health care provider if you need to take a cholesterol-lowering medicine (statin). Find healthy ways to manage stress, such  as: Meditation, yoga, or listening to music. Journaling. Talking to a trusted person. Spending time with friends and family. Safety Always wear your seat belt while driving or riding in a vehicle. Do not drive: If you have been drinking alcohol. Do not ride with someone who has been drinking. When you are tired or distracted. While texting. If you have been using any mind-altering substances or drugs. Wear a helmet and other protective equipment during sports activities. If you have firearms in your house, make sure you follow all gun safety procedures. Minimize exposure to UV radiation to reduce your risk of skin cancer. What's next? Visit your health care provider once a year for an annual wellness visit. Ask your health care provider how often you should have your eyes and teeth checked. Stay up to date on all vaccines. This information is not intended to replace advice given to you by your health care provider. Make sure you discuss any questions you have with your health care provider. Document Revised: 05/31/2021 Document Reviewed: 05/31/2021 Elsevier Patient Education  2024 [employer].

## 2024-08-24 NOTE — Progress Notes (Signed)
 Timothy [name] is a 83 y.o. male presents to office today for annual physical exam examination.    Discussed the use of AI scribe software for clinical note transcription with the patient, who gave verbal consent to proceed.  History of Present Illness   Timothy [name] is an 83 year old male who presents for an annual physical exam.  In early June, he experienced a fall while attempting to jump over an object, resulting in a left hand injury. 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 He was unable to use his hand for about five weeks and still has not regained is grip strength. He received a cortisone shot for his right shoulder injury.  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 During the fall, he hit his face, aggravating a scar from a previous surgery and causing numbness on the side of his face.  He mentions previous left rib pain, which was resolved with nerve block injections. No current issues with blood in stool or urine, and no burning during urination, although he occasionally experiences burning.  He is considering the shingles vaccine, noting that two of his sisters have had shingles, which he describes as 'painful' and 'horrible'. He is concerned about the potential severity and location of shingles outbreaks.  He stays active and does not consider himself a 'couch potato'.   Compliant with Diabetes/ HTN/ HLD meds. He is fasting this morning except for black coffee.      Occupation: retired, Marital status: married, Substance use: none Health Maintenance Due  Topic Date Due   Zoster Vaccines- Shingrix  (1 of 2) 06/08/1960   Medicare Annual Wellness (AWV)  11/27/2022   FOOT EXAM  05/14/2024   HEMOGLOBIN A1C  07/14/2024   Influenza Vaccine  07/17/2024   Refills needed today: all  Immunization History  Administered Date(s) Administered   Fluad Quad(high Dose 65+) 10/18/2020,  10/19/2021, 09/14/2022   Fluad Trivalent(High Dose 65+) 08/28/2023   INFLUENZA, HIGH DOSE SEASONAL PF 09/21/2016, 09/25/2017, 09/24/2018   Influenza Whole 08/17/2010   Influenza,inj,Quad PF,6+ Mos 09/28/2013, 10/11/2014, 10/10/2015, 11/06/2019   Moderna Sars-Covid-2 Vaccination 02/15/2020, 03/14/2020, 11/08/2020   Pneumococcal Conjugate-13 10/11/2014   Pneumococcal Polysaccharide-23 06/16/2008   Td 02/15/2003, 09/20/2011   Tdap 09/17/2011   Zoster, Live 05/18/2007   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   RIGHT NASAL SIDEWALL- NO TX   BCC (basal cell carcinoma) 11/15/2004   LEFT CHEST- TX CURET X 3, 5FU   BCC (basal cell carcinoma) 11/15/2004   RIGHT EAR- TX CURET X 3, EXCISION   BCC (basal cell carcinoma) 08/13/2005   RIGHT EAR- TX MOHS   BCC (basal cell carcinoma) 04/01/2008   SUPERFICIAL- UPPER LEFT BACK- TX CURET X 3, 5FU   BCC (basal cell carcinoma) 12/23/2012   ULCERATED- ABOVE RIGHT LIP- TX MOHS   Cataract    Chronic kidney disease    Colon polyp    Coronary artery disease    (left main normal, LAD 25-30% stenosis, distal 30-40% stenosis, circumflex obtuse marginal 50% stenosis,  right coronary artery dominant with long 75% followed by mid 80%  stenosis followed by 90% stenosis at the ostium of the PDA.  He had  stenting of the PDA by Dr. Morris.  This was a Cypher stent.   First angioplasty was 30, Cath 2011  100% RCA ).       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    Melanoma (HCC) 12/23/2012   IN SITU- LEFT SIDEBURN-TX MOHS   Melanoma (HCC) 02/27/2017   LEFT CHEEK- TX MOHS   Melanoma of face (HCC)    Myocardial infarction (HCC)    per pt/ had mild heart attacks/has 3 stents   OSA  (obstructive sleep apnea) 08/17/2016   SCC (squamous cell carcinoma) 11/16/2015   LEFT EAR RIM- TX EXCISION   SCC (squamous cell carcinoma) 02/27/2017   WELL DIFF- RIGHT TEMPLE-TX CURET X3,5FU   SCC (squamous cell carcinoma) 10/14/2019   WELL DIFF- RIGHT SHOULDER-TX CURET X 3, 5FU   SCC (squamous cell carcinoma) 01/24/2019   WELL DIFF- LEFT INNER CHEEK- MOHS   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   ABOVE LEFT OUTER BROW SUPERIOR- TX CURET X 3, 5FU   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   LEFT FOREHEAD-TX CURET X 3, 5FU   SCCA (squamous cell carcinoma) of skin 04/01/2008   RIGHT HAND- TX CURET X 3, 5FU   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   LEFT SIDE OF FACE- TX CURET AFTER BIOPSY   SCCA (squamous cell carcinoma) of skin 01/12/2021   Neck - anterior (in situ)   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/27/2022   Left Zygomatic Area (in situ) (tx p bx)   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/27/2022   Right Zygomatic Area (well diff)   Sleep apnea    no c-pap   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   in situ- dorsum of nose (CX35FU)   Social History   Socioeconomic History   Marital status: Married    Spouse name: Engineer, maintenance (IT)   Number of children: 2   Years of education: Not on file   Highest education level: Not on file  Occupational History   Occupation: retired  Tobacco Use   Smoking status: Former    Current packs/day: 0.00    Average packs/day: 2.0 packs/day for 25.0 years (50.0 ttl pk-yrs)    Types: Cigarettes    Start date: 08/13/1966    Quit date: 08/14/1991    Years since quitting: 33.0    Passive exposure: Never   Smokeless tobacco: Never  Vaping Use   Vaping status: Never Used  Substance and Sexual Activity   Alcohol use: Not Currently    Comment: rare   Drug use: No   Sexual activity: Not Currently  Other Topics Concern   Not on file  Social History Narrative   Married x 60 years in 2022.   7  grandchildren.   1 great granddaughter.    Social Drivers of Corporate investment banker Strain: Low Risk  (11/27/2021)   Overall Financial Resource Strain (CARDIA)    Difficulty of Paying Living Expenses: Not hard at all  Food Insecurity: No Food Insecurity (11/27/2021)   Hunger Vital Sign    Worried About Running Out of Food in the Last Year: Never true    Ran Out of Food in the Last Year: Never true  Transportation Needs: No Transportation Needs (11/27/2021)   PRAPARE - Administrator, Civil Service (Medical): No    Lack of Transportation (Non-Medical): No  Physical Activity: Insufficiently Active (11/27/2021)  Exercise Vital Sign    Days of Exercise per Week: 3 days    Minutes of Exercise per Session: 20 min  Stress: No Stress Concern Present (11/27/2021)   [employer] of Occupational Health - Occupational Stress Questionnaire    Feeling of Stress : Not at all  Social Connections: Socially Integrated (11/27/2021)   Social Connection and Isolation Panel    Frequency of Communication with Friends and Family: More than three times a week    Frequency of Social Gatherings with Friends and Family: More than three times a week    Attends Religious Services: More than 4 times per year    Active Member of [employer] or Organizations: Yes    Attends Engineer, structural: More than 4 times per year    Marital Status: Married  Catering manager Violence: Not At Risk (11/27/2021)   Humiliation, Afraid, Rape, and Kick questionnaire    Fear of Current or Ex-Partner: No    Emotionally Abused: No    Physically Abused: No    Sexually Abused: No   Past Surgical History:  Procedure Laterality Date   APPENDECTOMY     CERVICAL SPINE SURGERY     CHOLECYSTECTOMY     COLONOSCOPY  2004   multiple since   CORONARY ANGIOPLASTY WITH STENT PLACEMENT     3 sents   EYE SURGERY Bilateral    cataracts   HERNIA REPAIR Bilateral    MOHS SURGERY Right 12/2021   MOHS SURGERY   04/16/2024   He has had 4 surgeries on his forehead for melanoma   SKIN CANCER EXCISION  03/17/2017   SKIN CANCER EXCISION  03/17/2020   left side of face   Family History  Problem Relation Age of Onset   Cancer Mother        unsure of type   Glaucoma Mother    Heart disease Father    Coronary artery disease Father    Cancer Sister 4       granlocytosis (Wergerner's)   Nephritis Sister        20's   Colon cancer Brother 43   Cancer Brother        lung    Liver disease Neg Hx    Esophageal cancer Neg Hx     Current Outpatient Medications:    aspirin  81 MG EC tablet, Take 81 mg by mouth daily., Disp: , Rfl:    Blood Glucose Monitoring Suppl (ACCU-CHEK GUIDE ME) w/Device KIT, Check BS 4 times daily E11.21, Disp: 1 kit, Rfl: 0   dorzolamide-timolol (COSOPT) 2-0.5 % ophthalmic solution, Place 1 drop into both eyes 2 (two) times daily., Disp: , Rfl:    glipiZIDE  (GLUCOTROL ) 5 MG tablet, TAKE ONE TABLET ONCE DAILY BEFORE BREAKFAST, Disp: 90 tablet, Rfl: 03   isosorbide  mononitrate (IMDUR ) 60 MG 24 hr tablet, TAKE 1 TABLET DAILY, Disp: 90 tablet, Rfl: 2   latanoprost  (XALATAN ) 0.005 % ophthalmic solution, Place 1 drop into both eyes at bedtime., Disp: , Rfl:    nitroGLYCERIN  (NITROSTAT ) 0.4 MG SL tablet, Place 1 tablet (0.4 mg total) under the tongue every 5 (five) minutes as needed., Disp: 25 tablet, Rfl: 4   nystatin  cream (MYCOSTATIN ), Apply 1 Application topically 2 (two) times daily. Apply sparingly to the irritated area 2 x daily., Disp: 30 g, Rfl: 2   Omega-3 Fatty Acids (FISH OIL) 1000 MG CAPS, Take 2 capsules by mouth daily., Disp: , Rfl:    sucralfate  (CARAFATE ) 1 g tablet, Take 1  tablet (1 g total) by mouth with breakfast, with lunch, and with evening meal for 14 days., Disp: 42 tablet, Rfl: 0   tamsulosin  (FLOMAX ) 0.4 MG CAPS capsule, Take 1 capsule (0.4 mg total) by mouth daily., Disp: 90 capsule, Rfl: 3   Accu-Chek Softclix Lancets lancets, Check BS 4 times daily E11.21,  Disp: 100 each, Rfl: 11   amLODipine  (NORVASC ) 10 MG tablet, Take 1 tablet (10 mg total) by mouth daily. Put on hold, Disp: 90 tablet, Rfl: 3   carvedilol  (COREG ) 12.5 MG tablet, Take 1 tablet (12.5 mg total) by mouth 2 (two) times daily with a meal., Disp: 180 tablet, Rfl: 3   dapagliflozin  propanediol (FARXIGA ) 5 MG TABS tablet, Take 1 tablet (5 mg total) by mouth daily., Disp: 90 tablet, Rfl: 3   enalapril  (VASOTEC ) 2.5 MG tablet, Take 1 tablet (2.5 mg total) by mouth daily., Disp: 90 tablet, Rfl: 3   esomeprazole  (NEXIUM ) 40 MG capsule, Take 1 capsule (40 mg total) by mouth daily. Put on hold, Disp: 90 capsule, Rfl: 3   ezetimibe  (ZETIA ) 10 MG tablet, Take 1 tablet (10 mg total) by mouth daily., Disp: 90 tablet, Rfl: 3   glucose blood (ACCU-CHEK GUIDE TEST) test strip, Check BS 4 times daily E11.2, Disp: 100 strip, Rfl: 11   rosuvastatin  (CRESTOR ) 40 MG tablet, Take 1 tablet (40 mg total) by mouth daily., Disp: 90 tablet, Rfl: 3  Allergies  Allergen Reactions   Hydrocodone-Acetaminophen Other (See Comments)    Makes pt. Feel loopy   Lipitor [Atorvastatin ]     myalgias     ROS: Review of Systems Pertinent items noted in HPI and remainder of comprehensive ROS otherwise negative.    Physical exam BP (!) 147/73   Pulse 68   Temp 97.7 F (36.5 C)   Ht 5' 7 (1.702 m)   Wt 178 lb 6 oz (80.9 kg)   SpO2 97%   BMI 27.94 kg/m  General appearance: alert, cooperative, appears stated age, and no distress Head: Normocephalic, without obvious abnormality, atraumatic Eyes: negative findings: lids and lashes normal, conjunctivae and sclerae normal, corneas clear, and pupils equal, round, reactive to light and accomodation Ears: normal TM's and external ear canals both ears Nose: Nares normal. Septum midline. Mucosa normal. No drainage or sinus tenderness. Throat: False teeth.  Oropharynx without erythema or masses Neck: no adenopathy, no JVD, supple, symmetrical, trachea midline, and thyroid   not enlarged, symmetric, no tenderness/mass/nodules Back: Slight increased kyphosis of the thoracic spine Lungs: clear to auscultation bilaterally Chest wall: no tenderness Heart: regular rate and rhythm, S1, S2 normal, no murmur, click, rub or gallop Abdomen: soft, non-tender; bowel sounds normal; no masses,  no organomegaly Extremities: extremities normal, atraumatic, no cyanosis or edema Pulses: 2+ and symmetric Skin: Multiple hyperkeratotic lesions along the ankles, upper extremities. Lymph nodes: Cervical, supraclavicular, and axillary nodes normal. Neurologic: Grossly normal      08/24/2024   10:03 AM 01/15/2024    9:41 AM 11/05/2023    8:05 AM  Depression screen PHQ 2/9  Decreased Interest 0 0 0  Down, Depressed, Hopeless 0 0 0  PHQ - 2 Score 0 0 0  Altered sleeping 0 0 0  Tired, decreased energy 0 0 0  Change in appetite 0 0 0  Feeling bad or failure about yourself  0 0 0  Trouble concentrating 0 0 0  Moving slowly or fidgety/restless 0 0 0  Suicidal thoughts 0 0 0  PHQ-9 Score 0 0 0  Difficult doing work/chores Not difficult at all Not difficult at all Not difficult at all      08/24/2024   10:04 AM 01/15/2024    9:41 AM 11/05/2023    8:07 AM 09/17/2023    8:40 AM  GAD 7 : Generalized Anxiety Score  Nervous, Anxious, on Edge 0 0 0 0  Control/stop worrying 0 0 0 0  Worry too much - different things 0 0 0 0  Trouble relaxing 0 0 0 0  Restless 0 0 0 0  Easily annoyed or irritable 0 0 0 0  Afraid - awful might happen 0 0 0 0  Total GAD 7 Score 0 0 0 0  Anxiety Difficulty Not difficult at all Not difficult at all  Not difficult at all   Diabetic Foot Exam - Simple   Simple Foot Form Diabetic Foot exam was performed with the following findings: Yes 08/24/2024 10:31 AM  Visual Inspection No deformities, no ulcerations, no other skin breakdown bilaterally: Yes Sensation Testing Intact to touch and monofilament testing bilaterally: Yes Pulse Check Posterior Tibialis and  Dorsalis pulse intact bilaterally: Yes Comments      Assessment/ Plan: Elsie Lunger here for annual physical exam.   Annual physical exam  Diabetes mellitus treated with oral medication (HCC) - Plan: Bayer DCA Hb A1c Waived, CMP14+EGFR, Accu-Chek Softclix Lancets lancets, dapagliflozin  propanediol (FARXIGA ) 5 MG TABS tablet, glucose blood (ACCU-CHEK GUIDE TEST) test strip  Hyperlipidemia associated with type 2 diabetes mellitus (HCC) - Plan: CMP14+EGFR, Lipid Panel, TSH, ezetimibe  (ZETIA ) 10 MG tablet, rosuvastatin  (CRESTOR ) 40 MG tablet  Hypertension associated with type 2 diabetes mellitus (HCC) - Plan: CMP14+EGFR, amLODipine  (NORVASC ) 10 MG tablet, carvedilol  (COREG ) 12.5 MG tablet, enalapril  (VASOTEC ) 2.5 MG tablet  CKD stage 3 due to type 2 diabetes mellitus (HCC) - Plan: CMP14+EGFR, CBC with Differential, VITAMIN D  25 Hydroxy (Vit-D Deficiency, Fractures), dapagliflozin  propanediol (FARXIGA ) 5 MG TABS tablet  Gastroesophageal reflux disease without esophagitis - Plan: esomeprazole  (NEXIUM ) 40 MG capsule  Need for shingles vaccine  Squamous cell carcinoma, face  Assessment and Plan    Adult Wellness Visit Routine adult wellness visit with a focus on immunizations. - Administer first shingles vaccine today. - Schedule second shingles vaccine in 4-6 months. - Perform blood work including glucose levels.  Type 2 diabetes mellitus w/ HTN/ HLD w/ CKD3a - A1c, fasting labs - DM foot exam performed. 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 - Proceed with scheduled procedures for new squamous cell carcinoma lesions.     GERD Stable. PPI renewed. - Check CBC  Counseled on healthy lifestyle choices, including diet (rich in fruits, vegetables and lean meats and low in salt and simple carbohydrates) and exercise (at least 30 minutes of moderate  physical activity daily).  Patient to follow up 13m for DM/ Shingrix  #2  Adira Limburg M. Jolinda, DO

## 2024-08-25 ENCOUNTER — Ambulatory Visit: Payer: Self-pay | Admitting: Family Medicine

## 2024-08-25 DIAGNOSIS — E119 Type 2 diabetes mellitus without complications: Secondary | ICD-10-CM

## 2024-08-25 LAB — CBC WITH DIFFERENTIAL/PLATELET
Basophils Absolute: 0 x10E3/uL (ref 0.0–0.2)
Basos: 1 %
EOS (ABSOLUTE): 0.1 x10E3/uL (ref 0.0–0.4)
Eos: 1 %
Hematocrit: 42.3 % (ref 37.5–51.0)
Hemoglobin: 13.9 g/dL (ref 13.0–17.7)
Immature Grans (Abs): 0 x10E3/uL (ref 0.0–0.1)
Immature Granulocytes: 0 %
Lymphocytes Absolute: 2.2 x10E3/uL (ref 0.7–3.1)
Lymphs: 36 %
MCH: 29.8 pg (ref 26.6–33.0)
MCHC: 32.9 g/dL (ref 31.5–35.7)
MCV: 91 fL (ref 79–97)
Monocytes Absolute: 0.4 x10E3/uL (ref 0.1–0.9)
Monocytes: 7 %
Neutrophils Absolute: 3.3 x10E3/uL (ref 1.4–7.0)
Neutrophils: 55 %
Platelets: 137 x10E3/uL — ABNORMAL LOW (ref 150–450)
RBC: 4.66 x10E6/uL (ref 4.14–5.80)
RDW: 14.1 % (ref 11.6–15.4)
WBC: 6 x10E3/uL (ref 3.4–10.8)

## 2024-08-25 LAB — LIPID PANEL
Cholesterol, Total: 110 mg/dL (ref 100–199)
HDL: 33 mg/dL — AB (ref >39)
LDL CALC COMMENT:: 3.3 ratio (ref 0.0–5.0)
LDL Chol Calc (NIH): 43 mg/dL (ref 0–99)
Triglycerides: 210 mg/dL — AB (ref 0–149)
VLDL Cholesterol Cal: 34 mg/dL (ref 5–40)

## 2024-08-25 LAB — VITAMIN D 25 HYDROXY (VIT D DEFICIENCY, FRACTURES): Vit D, 25-Hydroxy: 23.7 ng/mL — AB (ref 30.0–100.0)

## 2024-08-25 LAB — CMP14+EGFR
ALT: 21 IU/L (ref 0–44)
AST: 17 IU/L (ref 0–40)
Albumin: 4.2 g/dL (ref 3.7–4.7)
Alkaline Phosphatase: 65 IU/L (ref 44–121)
BUN/Creatinine Ratio: 11 (ref 10–24)
BUN: 14 mg/dL (ref 8–27)
Bilirubin Total: 1 mg/dL (ref 0.0–1.2)
CO2: 21 mmol/L (ref 20–29)
Calcium: 9.4 mg/dL (ref 8.6–10.2)
Chloride: 106 mmol/L (ref 96–106)
Creatinine, Ser: 1.31 mg/dL — AB (ref 0.76–1.27)
Globulin, Total: 2.5 g/dL (ref 1.5–4.5)
Glucose: 183 mg/dL — AB (ref 70–99)
Potassium: 4.4 mmol/L (ref 3.5–5.2)
Sodium: 141 mmol/L (ref 134–144)
Total Protein: 6.7 g/dL (ref 6.0–8.5)
eGFR: 54 mL/min/1.73 — AB (ref >59)

## 2024-08-25 LAB — TSH: TSH: 2.63 u[IU]/mL (ref 0.450–4.500)

## 2024-08-25 MED ORDER — GLIPIZIDE 5 MG PO TABS: 5.0000 mg | ORAL_TABLET | Freq: Two times a day (BID) | ORAL | 3 refills | Status: AC

## 2024-09-09 DIAGNOSIS — C4442 Squamous cell carcinoma of skin of scalp and neck: Secondary | ICD-10-CM | POA: Diagnosis not present

## 2024-09-14 DIAGNOSIS — M75101 Unspecified rotator cuff tear or rupture of right shoulder, not specified as traumatic: Secondary | ICD-10-CM | POA: Diagnosis not present

## 2024-09-14 DIAGNOSIS — S40011A Contusion of right shoulder, initial encounter: Secondary | ICD-10-CM | POA: Diagnosis not present

## 2024-09-14 DIAGNOSIS — S60212A Contusion of left wrist, initial encounter: Secondary | ICD-10-CM | POA: Diagnosis not present

## 2024-09-14 DIAGNOSIS — M1812 Unilateral primary osteoarthritis of first carpometacarpal joint, left hand: Secondary | ICD-10-CM | POA: Diagnosis not present

## 2024-09-28 DIAGNOSIS — D0439 Carcinoma in situ of skin of other parts of face: Secondary | ICD-10-CM | POA: Diagnosis not present

## 2024-10-05 ENCOUNTER — Telehealth: Payer: Self-pay

## 2024-10-12 ENCOUNTER — Ambulatory Visit (INDEPENDENT_AMBULATORY_CARE_PROVIDER_SITE_OTHER)

## 2024-10-12 DIAGNOSIS — Z23 Encounter for immunization: Secondary | ICD-10-CM

## 2024-10-12 NOTE — Progress Notes (Signed)
 Patient is in office today for a nurse visit for Immunization. Patient Injection was given in the  Left deltoid. Patient tolerated injection well.

## 2024-10-27 DIAGNOSIS — H02135 Senile ectropion of left lower eyelid: Secondary | ICD-10-CM | POA: Diagnosis not present

## 2024-10-27 DIAGNOSIS — H40053 Ocular hypertension, bilateral: Secondary | ICD-10-CM | POA: Diagnosis not present

## 2024-10-27 DIAGNOSIS — H26491 Other secondary cataract, right eye: Secondary | ICD-10-CM | POA: Diagnosis not present

## 2024-10-27 DIAGNOSIS — H04123 Dry eye syndrome of bilateral lacrimal glands: Secondary | ICD-10-CM | POA: Diagnosis not present

## 2024-10-27 DIAGNOSIS — Z961 Presence of intraocular lens: Secondary | ICD-10-CM | POA: Diagnosis not present

## 2024-10-27 DIAGNOSIS — H02132 Senile ectropion of right lower eyelid: Secondary | ICD-10-CM | POA: Diagnosis not present

## 2024-11-30 ENCOUNTER — Telehealth: Payer: Self-pay

## 2024-11-30 NOTE — Telephone Encounter (Signed)
 Received a refill request fax from [place] patient assistance company for patients FARXIGA  medication.   Please send a 90 day supply (with refills) to [facility] if applicable. Thanks!

## 2024-12-01 ENCOUNTER — Telehealth: Payer: Self-pay | Admitting: Pharmacist

## 2024-12-01 DIAGNOSIS — E1122 Type 2 diabetes mellitus with diabetic chronic kidney disease: Secondary | ICD-10-CM

## 2024-12-01 DIAGNOSIS — E119 Type 2 diabetes mellitus without complications: Secondary | ICD-10-CM

## 2024-12-01 MED ORDER — DAPAGLIFLOZIN PROPANEDIOL 5 MG PO TABS: 5.0000 mg | ORAL_TABLET | Freq: Every day | ORAL | 4 refills | Status: AC

## 2024-12-01 NOTE — Telephone Encounter (Signed)
   Patient enrolled in the AZ&me patient assistance program for [place].  Updated RX escribed to [facility] mail order (pharmacy for AZ&me patient assistance).  Patient is stable on current regimen.    Kieth Brightly, PharmD, BCACP, CPP Clinical Pharmacist, [facility]

## 2025-02-22 ENCOUNTER — Ambulatory Visit: Payer: Self-pay | Admitting: Family Medicine

## 2025-03-24 ENCOUNTER — Ambulatory Visit: Admitting: Cardiology
